# Patient Record
Sex: Male | Born: 1935 | Race: White | Hispanic: No | Marital: Married | Smoking: Never smoker
Health system: Southern US, Community
[De-identification: ages and names within clinical notes are randomized; demographics above are authoritative.]

## PROBLEM LIST (undated history)

## (undated) DIAGNOSIS — I499 Cardiac arrhythmia, unspecified: Secondary | ICD-10-CM

## (undated) DIAGNOSIS — J90 Pleural effusion, not elsewhere classified: Secondary | ICD-10-CM

## (undated) DIAGNOSIS — E039 Hypothyroidism, unspecified: Secondary | ICD-10-CM

## (undated) DIAGNOSIS — I4510 Unspecified right bundle-branch block: Secondary | ICD-10-CM

## (undated) DIAGNOSIS — H3530 Unspecified macular degeneration: Secondary | ICD-10-CM

## (undated) DIAGNOSIS — K219 Gastro-esophageal reflux disease without esophagitis: Secondary | ICD-10-CM

## (undated) DIAGNOSIS — M1990 Unspecified osteoarthritis, unspecified site: Secondary | ICD-10-CM

## (undated) DIAGNOSIS — H9190 Unspecified hearing loss, unspecified ear: Secondary | ICD-10-CM

## (undated) DIAGNOSIS — M109 Gout, unspecified: Secondary | ICD-10-CM

## (undated) DIAGNOSIS — I3139 Other pericardial effusion (noninflammatory): Secondary | ICD-10-CM

## (undated) DIAGNOSIS — E079 Disorder of thyroid, unspecified: Secondary | ICD-10-CM

## (undated) DIAGNOSIS — Z973 Presence of spectacles and contact lenses: Secondary | ICD-10-CM

## (undated) DIAGNOSIS — Z87442 Personal history of urinary calculi: Secondary | ICD-10-CM

## (undated) DIAGNOSIS — K5790 Diverticulosis of intestine, part unspecified, without perforation or abscess without bleeding: Secondary | ICD-10-CM

## (undated) DIAGNOSIS — I313 Pericardial effusion (noninflammatory): Secondary | ICD-10-CM

## (undated) DIAGNOSIS — I10 Essential (primary) hypertension: Secondary | ICD-10-CM

## (undated) HISTORY — DX: Pleural effusion, not elsewhere classified: J90

## (undated) HISTORY — PX: ROTATOR CUFF REPAIR: SHX139

## (undated) HISTORY — DX: Cardiac arrhythmia, unspecified: I49.9

## (undated) HISTORY — DX: Disorder of thyroid, unspecified: E07.9

## (undated) HISTORY — PX: CATARACT EXTRACTION: SUR2

## (undated) HISTORY — PX: ARTHROSCOPY KNEE W/ DRILLING: SUR92

## (undated) HISTORY — PX: HERNIA REPAIR: SHX51

## (undated) HISTORY — PX: CARDIAC ELECTROPHYSIOLOGY STUDY AND ABLATION: SHX1294

## (undated) HISTORY — DX: Gout, unspecified: M10.9

## (undated) HISTORY — PX: TONSILLECTOMY: SUR1361

## (undated) HISTORY — DX: Unspecified osteoarthritis, unspecified site: M19.90

## (undated) HISTORY — PX: CHEST TUBE INSERTION: SHX231

## (undated) HISTORY — DX: Essential (primary) hypertension: I10

## (undated) HISTORY — DX: Gastro-esophageal reflux disease without esophagitis: K21.9

---

## 1999-03-15 HISTORY — PX: FRACTURE SURGERY: SHX138

## 2000-03-14 HISTORY — PX: ROTATOR CUFF REPAIR: SHX139

## 2000-08-30 ENCOUNTER — Ambulatory Visit (HOSPITAL_BASED_OUTPATIENT_CLINIC_OR_DEPARTMENT_OTHER): Admission: RE | Admit: 2000-08-30 | Discharge: 2000-08-30 | Payer: Self-pay | Admitting: Orthopedic Surgery

## 2000-09-11 ENCOUNTER — Encounter: Admission: RE | Admit: 2000-09-11 | Discharge: 2000-11-21 | Payer: Self-pay | Admitting: Orthopedic Surgery

## 2001-01-16 ENCOUNTER — Encounter: Payer: Self-pay | Admitting: Emergency Medicine

## 2001-01-17 ENCOUNTER — Encounter: Payer: Self-pay | Admitting: Internal Medicine

## 2001-01-17 ENCOUNTER — Inpatient Hospital Stay (HOSPITAL_COMMUNITY): Admission: EM | Admit: 2001-01-17 | Discharge: 2001-01-17 | Payer: Self-pay | Admitting: Emergency Medicine

## 2001-11-29 ENCOUNTER — Ambulatory Visit (HOSPITAL_COMMUNITY): Admission: RE | Admit: 2001-11-29 | Discharge: 2001-11-29 | Payer: Self-pay | Admitting: Gastroenterology

## 2001-11-29 ENCOUNTER — Encounter (INDEPENDENT_AMBULATORY_CARE_PROVIDER_SITE_OTHER): Payer: Self-pay

## 2005-03-14 HISTORY — PX: CARDIAC CATHETERIZATION: SHX172

## 2005-09-16 ENCOUNTER — Encounter: Admission: RE | Admit: 2005-09-16 | Discharge: 2005-09-16 | Payer: Self-pay | Admitting: Orthopedic Surgery

## 2005-09-21 ENCOUNTER — Ambulatory Visit (HOSPITAL_BASED_OUTPATIENT_CLINIC_OR_DEPARTMENT_OTHER): Admission: RE | Admit: 2005-09-21 | Discharge: 2005-09-21 | Payer: Self-pay | Admitting: Orthopedic Surgery

## 2006-03-21 ENCOUNTER — Inpatient Hospital Stay (HOSPITAL_COMMUNITY): Admission: EM | Admit: 2006-03-21 | Discharge: 2006-03-22 | Payer: Self-pay | Admitting: Emergency Medicine

## 2009-03-17 ENCOUNTER — Encounter: Payer: Self-pay | Admitting: Internal Medicine

## 2009-03-26 ENCOUNTER — Encounter: Payer: Self-pay | Admitting: Internal Medicine

## 2009-03-27 ENCOUNTER — Encounter: Payer: Self-pay | Admitting: Internal Medicine

## 2009-03-31 ENCOUNTER — Encounter: Payer: Self-pay | Admitting: Internal Medicine

## 2009-04-22 DIAGNOSIS — I10 Essential (primary) hypertension: Secondary | ICD-10-CM | POA: Insufficient documentation

## 2009-04-22 DIAGNOSIS — E039 Hypothyroidism, unspecified: Secondary | ICD-10-CM | POA: Insufficient documentation

## 2009-04-22 DIAGNOSIS — M129 Arthropathy, unspecified: Secondary | ICD-10-CM | POA: Insufficient documentation

## 2009-04-22 DIAGNOSIS — K219 Gastro-esophageal reflux disease without esophagitis: Secondary | ICD-10-CM

## 2009-04-23 ENCOUNTER — Ambulatory Visit: Payer: Self-pay | Admitting: Internal Medicine

## 2009-04-23 DIAGNOSIS — I471 Supraventricular tachycardia: Secondary | ICD-10-CM | POA: Insufficient documentation

## 2009-04-24 ENCOUNTER — Telehealth: Payer: Self-pay | Admitting: Internal Medicine

## 2009-04-28 ENCOUNTER — Encounter: Payer: Self-pay | Admitting: Internal Medicine

## 2009-05-06 ENCOUNTER — Ambulatory Visit: Payer: Self-pay | Admitting: Internal Medicine

## 2009-05-07 LAB — CONVERTED CEMR LAB
BUN: 14 mg/dL (ref 6–23)
Chloride: 105 meq/L (ref 96–112)
Eosinophils Absolute: 0.2 10*3/uL (ref 0.0–0.7)
INR: 1.1 — ABNORMAL HIGH (ref 0.8–1.0)
Lymphocytes Relative: 27.1 % (ref 12.0–46.0)
Lymphs Abs: 1.7 10*3/uL (ref 0.7–4.0)
MCHC: 33.2 g/dL (ref 30.0–36.0)
Monocytes Absolute: 0.5 10*3/uL (ref 0.1–1.0)
Monocytes Relative: 8.1 % (ref 3.0–12.0)
Neutro Abs: 3.9 10*3/uL (ref 1.4–7.7)
Neutrophils Relative %: 61.5 % (ref 43.0–77.0)
Platelets: 137 10*3/uL — ABNORMAL LOW (ref 150.0–400.0)
RBC: 4.5 M/uL (ref 4.22–5.81)
WBC: 6.3 10*3/uL (ref 4.5–10.5)

## 2009-05-13 ENCOUNTER — Ambulatory Visit: Payer: Self-pay | Admitting: Internal Medicine

## 2009-05-13 ENCOUNTER — Ambulatory Visit (HOSPITAL_COMMUNITY): Admission: RE | Admit: 2009-05-13 | Discharge: 2009-05-13 | Payer: Self-pay | Admitting: Internal Medicine

## 2009-05-14 ENCOUNTER — Telehealth: Payer: Self-pay | Admitting: Internal Medicine

## 2009-05-28 ENCOUNTER — Ambulatory Visit: Payer: Self-pay | Admitting: Internal Medicine

## 2009-10-15 ENCOUNTER — Inpatient Hospital Stay (HOSPITAL_COMMUNITY): Admission: AD | Admit: 2009-10-15 | Discharge: 2009-10-21 | Payer: Self-pay | Admitting: Cardiology

## 2009-10-15 ENCOUNTER — Ambulatory Visit: Payer: Self-pay | Admitting: Cardiothoracic Surgery

## 2009-10-16 ENCOUNTER — Encounter: Payer: Self-pay | Admitting: Cardiothoracic Surgery

## 2009-10-28 ENCOUNTER — Ambulatory Visit: Payer: Self-pay | Admitting: Cardiothoracic Surgery

## 2009-11-04 ENCOUNTER — Ambulatory Visit: Payer: Self-pay | Admitting: Cardiothoracic Surgery

## 2009-11-04 ENCOUNTER — Encounter: Admission: RE | Admit: 2009-11-04 | Discharge: 2009-11-04 | Payer: Self-pay | Admitting: Cardiothoracic Surgery

## 2010-04-15 NOTE — Miscellaneous (Signed)
Summary: list allergies  Clinical Lists Changes  Allergies: Added new allergy or adverse reaction of DEMEROL Added new allergy or adverse reaction of CELEBREX Observations: Added new observation of NKA: F (04/28/2009 11:08)

## 2010-04-15 NOTE — Progress Notes (Signed)
Summary: pt needs appt with Dr. Ladona Ridgel  Phone Note Call from Patient Call back at [phone]   Caller: Patient Reason for Call: Talk to Nurse, Talk to Doctor Summary of Call: per pt call he had a procedure yesterday and was suppose to call you to get set up for a 2wk f/u. First avail was not til April.  Initial call taken by: Omer Jack,  May 14, 2009 2:17 PM  Follow-up for Phone Call        05/28/09 at 9am  pt aware Dennis Bast, RN, BSN  May 14, 2009 3:07 PM

## 2010-04-15 NOTE — Assessment & Plan Note (Signed)
Summary: eph/ s/p ablation   Visit Type:  Follow-up Primary Provider:  Georgann Housekeeper MD  CC:  Groin pain.  History of Present Illness: Samuel Little returns today for followup.  He is a pleasant 77 yo man with a h/o HTN and SVT who underwent EPS/RFA two weeks ago.  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  He notes rare palpitations but no sustained heart racing.  He has gone back to working in his yard.  He has minimal discomfort in his groin which is now basically resolved.  Current Medications (verified): 1)  Atenolol 50 Mg Tabs (Atenolol) .... Take One Tablet By Mouth Daily 2)  Synthroid 175 Mcg Tabs (Levothyroxine Sodium) .... Take One Tablet By Mouth Once Daily. 3)  Probenecid 500 Mg Tabs (Probenecid) .... Take One Tablet By Mouth Once Daily. 4)  Lisinopril-Hydrochlorothiazide 20-12.5 Mg Tabs (Lisinopril-Hydrochlorothiazide) .... Take One Tablet By Mouth Once Daily. 5)  Doxazosin Mesylate 4 Mg Tabs (Doxazosin Mesylate) .... Take One Tablet By Mouth Once Daily. 6)  Omeprazole 20 Mg Cpdr (Omeprazole) .... Take One Tablet By Mouth Once Daily. 7)  Lorazepam 0.5 Mg Tabs (Lorazepam) .... As Needed 8)  Colchicine 0.6 Mg Tabs (Colchicine) .... As Needed 9)  Hydrocodone-Acetaminophen 5-500 Mg Tabs (Hydrocodone-Acetaminophen) .... As Needed 10)  Aleve 220 Mg Tabs (Naproxen Sodium) .... As Needed 11)  Tylenol Arthritis Pain 650 Mg Cr-Tabs (Acetaminophen) .... As Needed 12)  Lutein 15-0.7 Mg Caps (Lutein-Zeaxanthin) .... Uad 13)  Bilberry Plus  Caps (Specialty Vitamins Products) .... Once Daily 14)  Coral Calcium Plus  Caps (Multiple Vitamins-Minerals) .... Once Daily 15)  Ginkgo Biloba 120 Mg Caps (Ginkgo Biloba) .... Once Daily 16)  Vitamin D 2000 Unit Tabs (Cholecalciferol) .... Once Daily 17)  Vitamin C 500 Mg Tabs (Ascorbic Acid) .... Once Daily 18)  Vitamin E 400 Unit Caps (Vitamin E) .... Once Daily 19)  B Complex  Tabs (B Complex Vitamins) .... Once Daily 20)  Caltrate  600+d 600-400 Mg-Unit Tabs (Calcium Carbonate-Vitamin D) .... Once Daily 21)  Cranberry 500 Mg Caps (Cranberry) .... Once Daily 22)  Co Q-10 120 Mg Caps (Coenzyme Q10) .... Once Daily 23)  Pa Resveratrol 250 Mg Caps (Resveratrol) .... Once Daily 24)  Turmeric  Powd (Turmeric (Curcuma Longa)) .... Once Daily 25)  Ra Krill Oil  Caps (Krill Oil) .... Once Daily 26)  Fish Oil   Oil (Fish Oil) .... Once Daily 27)  Glucosamine-Chondroitin   Caps (Glucosamine-Chondroit-Vit C-Mn) .... Once Daily 28)  Black Cherry .... Once Daily 29)  I-Caps .... Once Daily  Allergies: 1)  ! Demerol 2)  ! Celebrex  Past History:  Past Medical History: Last updated: 04/22/2009  1. Arthritis.  2. Gastroesophageal reflux disease.  3. Gout.  4. Hypertension.  5. Hypothyroidism.  Past Surgical History: Last updated: 04/22/2009 1. Lithotripsy per Dr. Larey Dresser. 2. Left inguinal hernia repair. 3. Right rotator cuff repair and right leg fibular surgery for plate placement    - Dr. Georgena Spurling.  Review of Systems  The patient denies chest pain, syncope, dyspnea on exertion, and peripheral edema.    Vital Signs:  Patient profile:   77 year old male Height:      73 inches Weight:      247 pounds BMI:     32.71 Pulse rate:   59 / minute BP sitting:   152 / 72  (left arm)  Vitals Entered By: Samuel Little CMA (May 28, 2009 8:47 AM)  Physical Exam  General:  Well developed, well nourished, in no acute distress.  HEENT: normal Neck: supple. No JVD. Carotids 2+ bilaterally no bruits Cor: RRR no rubs, gallops or murmur Lungs: CTA Ab: soft, nontender. nondistended. No HSM. Good bowel sounds Ext: warm. no cyanosis, clubbing or edema Neuro: alert and oriented. Grossly nonfocal. affect pleasant    Impression & Recommendations:  Problem # 1:  PSVT (ICD-427.0) He has done well after ablation. From my perspective he could stop his atenolol but I will defer this to Dr. Ty Hilts. His updated  medication list for this problem includes:    Atenolol 50 Mg Tabs (Atenolol) .Marland Kitchen... Take one tablet by mouth daily    Lisinopril-hydrochlorothiazide 20-12.5 Mg Tabs (Lisinopril-hydrochlorothiazide) .Marland Kitchen... Take one tablet by mouth once daily.  Problem # 2:  HYPERTENSION (ICD-401.9) His blood pressure is up slightly.  He is scheduled to see Dr. Amil Amen in two weeks and he will adjust his meds as needed for more optimal blood pressure control. His updated medication list for this problem includes:    Atenolol 50 Mg Tabs (Atenolol) .Marland Kitchen... Take one tablet by mouth daily    Lisinopril-hydrochlorothiazide 20-12.5 Mg Tabs (Lisinopril-hydrochlorothiazide) .Marland Kitchen... Take one tablet by mouth once daily.    Doxazosin Mesylate 4 Mg Tabs (Doxazosin mesylate) .Marland Kitchen... Take one tablet by mouth once daily.

## 2010-04-15 NOTE — Progress Notes (Signed)
Summary: Calling to set procedure.  Phone Note Call from Patient Call back at Home Phone [phone]   Caller: Patient Summary of Call: Pt calling to set up procedure for 05/13/09 Initial call taken by: Judie Grieve,  April 24, 2009 11:22 AM  Follow-up for Phone Call        pt scheduled for SVT ablation on 05/13/09 at 7:30am  will have labs drawn on 05/05/09 at 3pm Dennis Bast, RN, BSN  April 24, 2009 1:14 PM

## 2010-04-15 NOTE — Letter (Signed)
Summary: ELectrophysiology/Ablation Procedure Instructions  [facility], Main Office  [address]   [place], [place] [address]   Phone: [phone]  Fax: [phone]     Electrophysiology/Ablation Procedure Instructions    You are scheduled for a(n) SVT ablation on 05/13/09 at 8:30am with Dr. Ladona Ridgel  1.  Please come to the Short Stay Center at [facility] at 6:30am on the day of your procedure.  2.  Come prepared to stay overnight.   Please bring your insurance cards and a list of your medications.  3.  Come to the [place] office on 05/06/09 at 3pm for lab work.  The lab at [facility] is open from 8:30 AM to 1:30 PM and 2:30 PM to 5:00 PM.    You do not have to be fasting.  4.  Do not have anything to eat or drink after midnight the night before your procedure.  5. .  All of your remaining medications may be taken with a small amount of water.  6.  Educational material received:     _____ Ablation   * Occasionally, EP studies and ablations can become lengthy.  Please make your family aware of this before your procedure starts.  Average time ranges from 2-8 hours for EP studies/ablations.  Your physician will locate your family after the procedure with the results.  * If you have any questions after you get home, please call the office at [phone].  Anselm Pancoast

## 2010-04-15 NOTE — Assessment & Plan Note (Signed)
Summary: nep/rapid heart rythmn/jml   Visit Type:  Initial Consult   History of Present Illness: Samuel Little is referred today by Dr. Amil Amen for evaluation of SVT.  The patient is a pleasant 77 yo man with a h/o HTN who had an initial episode of SVT about 10 yrs ago.  Since then his spells have increased in frequency and severity, lasting up to 30 minutes in duration.  They start and stop suddenly, are associated with SOB and dizziness without frank syncope.  The patient has taken beta blockers in escalating doses with only minimal improvement.  No other symptoms except for mild chest pressure with  the episodes.  Past History:  Past Medical History: Last updated: 04/22/2009  1. Arthritis.  2. Gastroesophageal reflux disease.  3. Gout.  4. Hypertension.  5. Hypothyroidism.  Past Surgical History: Last updated: 04/22/2009 1. Lithotripsy per Dr. Larey Dresser. 2. Left inguinal hernia repair. 3. Right rotator cuff repair and right leg fibular surgery for plate placement    - Dr. Georgena Spurling.  Family History: Last updated: 04/22/2009  Very strong for coronary artery disease, hypertension, diabetes.  Social History: Last updated: 04/22/2009  No tobacco or alcohol.  He is retired.  He is married.  Wife is present and very supportive.  Review of Systems       All systems reviewed and negative except as noted in the HPI.  Vital Signs:  Patient profile:   77 year old male Height:      73 inches Weight:      242 pounds BMI:     32.04 Pulse rate:   55 / minute BP sitting:   130 / 80  (left arm)  Vitals Entered By: Laurance Flatten CMA (April 23, 2009 10:44 AM)  Physical Exam  General:  Well developed, well nourished, in no acute distress.  HEENT: normal Neck: supple. No JVD. Carotids 2+ bilaterally no bruits Cor: RRR no rubs, gallops or murmur Lungs: CTA Ab: soft, nontender. nondistended. No HSM. Good bowel sounds Ext: warm. no cyanosis, clubbing or edema Neuro: alert  and oriented. Grossly nonfocal. affect pleasant    EKG  Procedure date:  03/17/2009  Findings:      Normal sinus rhythm with rate of: 74. First degree AV-Block noted.  Right bundle branch block.    Impression & Recommendations:  Problem # 1:  PSVT (ICD-427.0)  The patient has had recurrent symptoms despite medical therapy.  I have discussed the risks/benefits/goals and expectations of ablation and he wishes to proceed. His updated medication list for this problem includes:    Atenolol 50 Mg Tabs (Atenolol) .Marland Kitchen... Take one tablet by mouth daily    Lisinopril-hydrochlorothiazide 20-12.5 Mg Tabs (Lisinopril-hydrochlorothiazide) .Marland Kitchen... Take one tablet by mouth once daily.  His updated medication list for this problem includes:    Atenolol 50 Mg Tabs (Atenolol) .Marland Kitchen... Take one tablet by mouth daily    Lisinopril-hydrochlorothiazide 20-12.5 Mg Tabs (Lisinopril-hydrochlorothiazide) .Marland Kitchen... Take one tablet by mouth once daily.  Problem # 2:  HYPERTENSION (ICD-401.9) His blood pressure is controlled though he does have some fatigue on atenolol.  After ablation, I would expect to stop this medicine and he might require additional medical therapy for his HTN. His updated medication list for this problem includes:    Atenolol 50 Mg Tabs (Atenolol) .Marland Kitchen... Take one tablet by mouth daily    Lisinopril-hydrochlorothiazide 20-12.5 Mg Tabs (Lisinopril-hydrochlorothiazide) .Marland Kitchen... Take one tablet by mouth once daily.    Doxazosin Mesylate 4 Mg Tabs (Doxazosin  mesylate) .Marland Kitchen... Take one tablet by mouth once daily.

## 2010-04-15 NOTE — Consult Note (Signed)
Summary: Samuel Little   Imported By: Marylou Mccoy 05/19/2009 15:04:45  _____________________________________________________________________  External Attachment:    Type:   Image     Comment:   External Document

## 2010-04-15 NOTE — Consult Note (Signed)
Summary: Samuel Little   Imported By: Marylou Mccoy 05/19/2009 15:05:57  _____________________________________________________________________  External Attachment:    Type:   Image     Comment:   External Document

## 2010-04-15 NOTE — Procedures (Signed)
Summary: Event  Event   Imported By: Marylou Mccoy 04/28/2009 09:46:42  _____________________________________________________________________  External Attachment:    Type:   Image     Comment:   External Document

## 2010-05-28 LAB — TISSUE CULTURE

## 2010-05-28 LAB — BASIC METABOLIC PANEL
BUN: 11 mg/dL (ref 6–23)
CO2: 23 mEq/L (ref 19–32)
Calcium: 8.1 mg/dL — ABNORMAL LOW (ref 8.4–10.5)
Chloride: 104 mEq/L (ref 96–112)
Creatinine, Ser: 0.9 mg/dL (ref 0.4–1.5)
GFR calc Af Amer: >60 mL/min (ref >60)
GFR calc non Af Amer: >60 mL/min (ref >60)
Glucose, Bld: 90 mg/dL (ref 70–99)
Potassium: 4.2 mEq/L (ref 3.5–5.1)
Sodium: 134 mEq/L — ABNORMAL LOW (ref 135–145)

## 2010-05-28 LAB — BODY FLUID CULTURE
Culture: NO GROWTH
Culture: NO GROWTH
Gram Stain: NONE SEEN
Gram Stain: NONE SEEN

## 2010-05-28 LAB — FUNGUS CULTURE W SMEAR

## 2010-05-28 LAB — CBC
HCT: 30.9 % — ABNORMAL LOW (ref 39.0–52.0)
HCT: 32.3 % — ABNORMAL LOW (ref 39.0–52.0)
HCT: 33.3 % — ABNORMAL LOW (ref 39.0–52.0)
Hemoglobin: 10.3 g/dL — ABNORMAL LOW (ref 13.0–17.0)
Hemoglobin: 11 g/dL — ABNORMAL LOW (ref 13.0–17.0)
Hemoglobin: 11 g/dL — ABNORMAL LOW (ref 13.0–17.0)
MCH: 30.5 pg (ref 26.0–34.0)
MCH: 30.5 pg (ref 26.0–34.0)
MCH: 30.6 pg (ref 26.0–34.0)
MCH: 31 pg (ref 26.0–34.0)
MCHC: 33 g/dL (ref 30.0–36.0)
MCHC: 33.3 g/dL (ref 30.0–36.0)
MCHC: 34.1 g/dL (ref 30.0–36.0)
MCV: 91 fL (ref 78.0–100.0)
MCV: 91.4 fL (ref 78.0–100.0)
MCV: 92.5 fL (ref 78.0–100.0)
Platelets: 222 10*3/uL (ref 150–400)
Platelets: 230 10*3/uL (ref 150–400)
Platelets: 264 10*3/uL (ref 150–400)
RBC: 3.38 MIL/uL — ABNORMAL LOW (ref 4.22–5.81)
RBC: 3.55 MIL/uL — ABNORMAL LOW (ref 4.22–5.81)
RBC: 3.6 MIL/uL — ABNORMAL LOW (ref 4.22–5.81)
RDW: 13.6 % (ref 11.5–15.5)
RDW: 13.8 % (ref 11.5–15.5)
RDW: 13.8 % (ref 11.5–15.5)
RDW: 13.8 % (ref 11.5–15.5)
WBC: 7.7 10*3/uL (ref 4.0–10.5)
WBC: 8.2 10*3/uL (ref 4.0–10.5)
WBC: 8.2 10*3/uL (ref 4.0–10.5)

## 2010-05-28 LAB — TYPE AND SCREEN
ABO/RH(D): O POS
Antibody Screen: NEGATIVE

## 2010-05-28 LAB — COMPREHENSIVE METABOLIC PANEL
ALT: 26 U/L (ref 0–53)
AST: 18 U/L (ref 0–37)
Albumin: 2.4 g/dL — ABNORMAL LOW (ref 3.5–5.2)
Albumin: 2.9 g/dL — ABNORMAL LOW (ref 3.5–5.2)
Alkaline Phosphatase: 170 U/L — ABNORMAL HIGH (ref 39–117)
BUN: 23 mg/dL (ref 6–23)
CO2: 23 mEq/L (ref 19–32)
CO2: 24 mEq/L (ref 19–32)
Calcium: 8.3 mg/dL — ABNORMAL LOW (ref 8.4–10.5)
Calcium: 8.8 mg/dL (ref 8.4–10.5)
Chloride: 97 mEq/L (ref 96–112)
Chloride: 99 mEq/L (ref 96–112)
Creatinine, Ser: 1 mg/dL (ref 0.4–1.5)
Creatinine, Ser: 1.36 mg/dL (ref 0.4–1.5)
GFR calc Af Amer: >60 mL/min (ref >60)
GFR calc non Af Amer: 51 mL/min — ABNORMAL LOW (ref >60)
GFR calc non Af Amer: >60 mL/min (ref >60)
Glucose, Bld: 111 mg/dL — ABNORMAL HIGH (ref 70–99)
Glucose, Bld: 139 mg/dL — ABNORMAL HIGH (ref 70–99)
Potassium: 4 mEq/L (ref 3.5–5.1)
Potassium: 4 mEq/L (ref 3.5–5.1)
Sodium: 131 mEq/L — ABNORMAL LOW (ref 135–145)
Total Bilirubin: 1.5 mg/dL — ABNORMAL HIGH (ref 0.3–1.2)
Total Protein: 5.1 g/dL — ABNORMAL LOW (ref 6.0–8.3)
Total Protein: 6.2 g/dL (ref 6.0–8.3)

## 2010-05-28 LAB — ANAEROBIC CULTURE: Gram Stain: NONE SEEN

## 2010-05-28 LAB — MRSA PCR SCREENING: MRSA by PCR: NEGATIVE

## 2010-05-28 LAB — BLOOD GAS, ARTERIAL
Acid-Base Excess: 0.2 mmol/L (ref 0.0–2.0)
Bicarbonate: 23.4 mEq/L (ref 20.0–24.0)
Drawn by: 252031
O2 Saturation: 92 %
Patient temperature: 98.6
TCO2: 24.4 mmol/L (ref 0–100)
pCO2 arterial: 32 mmHg — ABNORMAL LOW (ref 35.0–45.0)
pH, Arterial: 7.477 — ABNORMAL HIGH (ref 7.350–7.450)
pO2, Arterial: 59.7 mmHg — ABNORMAL LOW (ref 80.0–100.0)

## 2010-05-28 LAB — MYCOPLASMA / UREAPLASMA CULTURE

## 2010-05-28 LAB — POCT I-STAT 3, ART BLOOD GAS (G3+)
pCO2 arterial: 33.8 mmHg — ABNORMAL LOW (ref 35.0–45.0)
pH, Arterial: 7.44 (ref 7.350–7.450)

## 2010-05-28 LAB — BRAIN NATRIURETIC PEPTIDE: Pro B Natriuretic peptide (BNP): 142 pg/mL — ABNORMAL HIGH (ref 0.0–100.0)

## 2010-05-28 LAB — HEMOGLOBIN A1C
Hgb A1c MFr Bld: 5.9 % — ABNORMAL HIGH (ref <5.7)
Mean Plasma Glucose: 123 mg/dL — ABNORMAL HIGH (ref <117)

## 2010-05-28 LAB — AFB CULTURE WITH SMEAR (NOT AT ARMC): Acid Fast Smear: NONE SEEN

## 2010-05-28 LAB — TSH: TSH: 2.537 u[IU]/mL (ref 0.350–4.500)

## 2010-05-28 LAB — PROTIME-INR: INR: 1.25 (ref 0.00–1.49)

## 2010-07-27 NOTE — Assessment & Plan Note (Signed)
OFFICE VISIT   Samuel Little, Samuel Little  DOB:  [birth year]                                        November 04, 2009  CHART #:  [id number]   CURRENT PROBLEMS:  1. Status post urgent subxiphoid pericardial window for drainage of      large pericardial effusion and hemorrhagic pericarditis.  2. Hypertension.  3. Gastroesophageal reflux disease.   PRESENT ILLNESS:  The patient is a very nice 77 year old male who  returns for his first office visit after undergoing urgent subxiphoid  pericardial window for a large pericardial effusion secondary to  hemorrhagic pericarditis.  This developed in the month of July and  became progressive and was carefully followed by Dr. Amil Amen.  At one  point, a physician in [place] gave the patient Pradaxa for concern over  paroxysmal atrial fibrillation.   Findings of surgery included drainage of a 850 mL effusion with  hemorrhagic pericarditis with a shaggy epicardial fibrinous exudate.  Cultures were all negative and cytologies were also negative.  The  patient was treated with IV antibiotics as well as a course of oral  doxycycline and has done well.  He has no more symptoms of shortness of  breath and weakness or dizziness.  He is in sinus rhythm.  He has not  resumed any aspirin and has been on no blood thinners for the past 3  weeks.   PHYSICAL EXAMINATION:  Blood pressure 110/60, pulse 70, respirations 18,  saturation 95%.  He is alert and comfortable.  Breath sounds are clear  and equal.  Cardiac rhythm is regular.  There is no pericardial  friction, rub, murmur, or gallop.  The pericardial window incision is  well healed.  There is no peripheral edema.   DIAGNOSTIC TESTS:  Chest x-ray reveals clear lung fields, a small  cardiac silhouette, and no pleural effusions.   IMPRESSION AND PLAN:  The patient has had a good early recovery from the  pericardial window.  There is no sign of recurrent pericarditis or  effusion.  I  told him after November 12, 2009, he could start taking  aspirin 81 mg a day and he will increase his activity level gradually  over the next 2 months.  He returned to the care of Dr. Amil Amen and Dr.  Donette Larry and return here as needed.   Kerin Perna, M.D.  Electronically Signed   PV/MEDQ  D:  11/04/2009  T:  11/05/2009  Job:  [id number]

## 2010-07-30 NOTE — H&P (Signed)
[place]  Patient:    Samuel Little, Samuel Little Visit Number: [id number] MRN: [id number]          Service Type: MED Location: 2000 2034 [id number] Attending Physician:  Georgann Housekeeper Dictated by:   Pearla Dubonnet, M.D. Admit Date:  01/16/2001   CC:         Tyson Dense, M.D.  Georgena Spurling, M.D.  Maretta Bees. Vonita Moss, M.D.   History and Physical  CHIEF COMPLAINT:  Chest and shoulder pain.  HISTORY OF PRESENT ILLNESS:  Samuel Little is a pleasant 77 year old male with a history of: 1. Hypertension for greater than 15 years. 2. Renal calculi - followed by Dr. Larey Dresser. 3. Gout. 4. DJD. 5. Hypothyroidism. 6. Episodic GERD. 7. BPH.  The patient presents with an episode of having felt very light-headed and as if he is going to pass out after urinating tonight earlier this evening at approximately 8 p.m.  He had been helping move some furniture in his daughters home.  He felt very weak and sweaty following the lightheadedness, consistent with a vasovagal reaction.  However, shortly thereafter patient noticed some left throat and left shoulder pain and also some left forearm pain; this lasted for a minute or so.  Patient does have a very strong family history of coronary artery disease with his father having had an MI and dying of heart failure at age 30, and another brother with a myocardial infarction and another who has had bypass operation.  Patient denies any heartburn tonight that he has had typically in the past, for which he takes occasional Prevacid.  He has not exercised very much over the past year secondary to orthopedic problems, including a right rotator cuff injury with repair this past summer, and a right fibular fracture requiring a plate in November 2001 - both procedures performed by Dr. Georgena Spurling.  He is admitted now to rule out critical coronary artery disease/unstable angina/MI.  MEDICATIONS: 1. Prinzide 20/12.5 one  daily. 2. Synthroid 175 mcg daily. 3. Prevacid 30 mg daily p.r.n. 4. Probenecid 500 mg orally daily. 5. Tylenol two daily. 6. Baby aspirin one at night. 7. Vioxx occasional use. 8. Cardura 4 mg p.o. q.h.s.  PAST SURGICAL HISTORY: 1. Lithotripsy per Dr. Larey Dresser. 2. Left inguinal hernia repair. 3. Right rotator cuff repair and right leg fibular surgery for plate placement    - Dr. Georgena Spurling.  FAMILY HISTORY:  Very strong for coronary artery disease, hypertension, diabetes.  SOCIAL HISTORY:  No tobacco or alcohol.  He is retired.  He is married.  Wife is present and very supportive.  REVIEW OF SYSTEMS:  No shortness of breath, no cough, no PND, no orthopnea. No change in bowel habits.  Positive nocturia x 1-2.  No claudication.  PHYSICAL EXAMINATION:  GENERAL:  Alert, oriented male in no acute distress.  VITAL SIGNS:  Temperature 97.4, blood pressure 105/71, pulse 70 and regular, respiratory rate 18 and easy.  O2 saturation 97% on room air.  HEENT:  Atraumatic, normocephalic.  NECK:  Supple without JVD.  No obvious goiter, no bruits.  CHEST:  Clear to auscultation.  CARDIAC:  Regular rhythm without murmur, rub, or gallop.  ABDOMEN:  Soft, nontender.  Normal bowel sounds.  No obvious organomegaly.  EXTREMITIES:  Without edema.  NEUROLOGIC:  Nonfocal.  SKIN:  The patient does have an erythematous, splotchy rash over his neck and upper back which is chronic; he sees a dermatologist for this.  LABORATORY DATA:  EKG reveals sinus rhythm at 88 beats per minute, nonspecific ST-T wave changes.  Chest x-ray reveals no active disease.  Protime 14 seconds, INR 1.1, PTT 26 seconds.  White count 6900; hemoglobin 15.3; platelet count 143,000.  BUN 18, creatinine 1.6.  CPK 224, CK-MB 2.9, troponin I 0.03.  LFTs are normal.  ASSESSMENT:  A 77 year old male with throat and arm pain transiently this evening after symptoms suggestive of possible angina.  With his very  strong family history, will admit and rule out MI, and if ruled out will schedule a treadmill stress test.  PLAN: 1. Treat with aspirin, Protonix, nitroglycerin patch, Synthroid, Prinzide,    Cardura, and probenecid. 2. Continue usual medications. 3. Monitor. 4. Check serial cardiac enzymes. 5. EKG in a.m. 6. Cardiology consult in a.m. for stress testing if and when he rules out for    myocardial injury. Dictated by:   Pearla Dubonnet, M.D. Attending Physician:  Georgann Housekeeper DD:  01/17/01 TD:  01/17/01 Job: [id number]

## 2010-07-30 NOTE — Op Note (Signed)
NAMEMAXX, PHAM                ACCOUNT NO.:  [id number]   MEDICAL RECORD NO.:  [id number]          PATIENT TYPE:  AMB   LOCATION:  DSC                          FACILITY:  [facility]   PHYSICIAN:  Mila Homer. Sherlean Foot, M.D. DATE OF BIRTH:  1932-12-26   DATE OF PROCEDURE:  DATE OF DISCHARGE:                                 OPERATIVE REPORT   SURGEON:  Mila Homer. Sherlean Foot, M.D.   ASSISTANT:  None.   ANESTHESIA:  General LMA.   PREOPERATIVE DIAGNOSIS:  Right knee medial meniscal tear and osteoarthritis.   POSTOPERATIVE DIAGNOSIS:  Right knee medial meniscal tear and  osteoarthritis.   PROCEDURE:  Right knee arthroscopy with partial medial, partial lateral  meniscectomies, chondroplasty in 2 compartments.   INDICATIONS FOR PROCEDURE:  The patient is a 77 year old with the above  symptoms.  Informed consent was obtained.   DESCRIPTION OF THE PROCEDURE:  The patient was laid supine under general LMA  anesthesia.  The right leg was prepped in the usual, sterile fashion.  Inferolateral and inferomedial portals were created with an #11 blade, blunt  trocar and cannula.  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  Chondroplasty was performed here.  Also performed a chondroplasty on the grade 2 and 3 chondromalacia in the  depth of the trochlear.  Then went into the medial compartment.  There was  some grade 4 chondromalacia on both femur and tibia, especially on the far  medial portion.  There was a complex medial meniscus tear.  Straight basket  forceps and a great white shaver was used to perform a partial medial  meniscectomy.  ACL and PCL were normal.  There were osteophytes in the  notch.  I went into the figure-four position.  There was only grade 1 to 2  chondromalacia, which was debrided, and a complex lateral meniscus tear.  This was debrided with the straight basket forceps and a great white shaver.  I then took one further tour  through all 3 compartments to ensure that all  debris had been removed and cartilage had been trimmed back to stable  tissue.  I then removed the fluid and instruments and closed with 4-0 nylon  sutures, dressed with a Xeroform dressing, sponge, sterile web roll and Ace  wrap.   COMPLICATIONS:  None.   DRAINS:  None.          ______________________________  Mila Homer. Sherlean Foot, M.D.    SDL/MEDQ  D:  09/21/2005  T:  09/21/2005  Job:  [id number]

## 2010-07-30 NOTE — Op Note (Signed)
   NAME:  BOOKERT, GUZZI                          ACCOUNT NO.:  [id number]   MEDICAL RECORD NO.:  [id number]                   PATIENT TYPE:  AMB   LOCATION:  ENDO                                 FACILITY:  [facility]   PHYSICIAN:  Danise Edge, MD                  DATE OF BIRTH:  March 12, 1933   DATE OF PROCEDURE:  11/29/2001  DATE OF DISCHARGE:                                 OPERATIVE REPORT   PROCEDURE:  Colonoscopy and polypectomy.   INDICATIONS:  The patient is a 77 year old male born 2032/07/26.  The patient  is scheduled to undergo his first screening colonoscopy with polypectomy to  prevent colon cancer.  I discussed with the patient the complications  associated with colonoscopy and polypectomy, including a 15 per thousand  risk of bleeding and one per thousand risk of colon perforation requiring  surgical repair.  The patient has signed the operative permit.   ENDOSCOPIST:  Danise Edge, M.D.   PREMEDICATION:  Versed 5 mg, fentanyl 50 mcg.   ENDOSCOPE:  Olympus pediatric colonoscope.   DESCRIPTION OF PROCEDURE:  After obtaining informed consent, the patient was  placed in the left lateral decubitus position.  I administered intravenous  Versed and intravenous fentanyl to achieve conscious sedation for the  procedure.  The patient's blood pressure, oxygen saturation, and cardiac  rhythm were monitored throughout the procedure and documented in the medical  record.   Anal inspection was normal.  Digital rectal exam was normal.  The Olympus  pediatric video colonoscope was introduced into the rectum and easily  advanced to the cecum.  Colonic preparation for the exam today was  excellent.   The patient has universal colonic diverticulosis without signs for the  presence of diverticulitis or diverticular stricture formation.   Rectum normal.   Sigmoid colon and descending colon normal.   Splenic flexure normal.   Transverse colon normal.   Hepatic flexure normal.   Ascending colon:  From the midascending colon, a 0.5 mm sessile polyp was  removed with the cold biopsy forceps.   Cecum and ileocecal valve normal.   ASSESSMENT:  1. Universal colonic diverticulosis.  2. A 0.5 mm sessile polyp was removed from the midascending colon.   RECOMMENDATIONS:  If ascending colon polyp returns neoplastic  pathologically, the patient should undergo a repeat colonoscopy in five  years.                                               Danise Edge, MD    MJ/MEDQ  D:  11/29/2001  T:  11/30/2001  Job:  [id number]   cc:   Georgann Housekeeper, MD  [address]  [place]  [place] [address]  Fax: [phone]

## 2010-07-30 NOTE — Op Note (Signed)
[place]  Patient:    Samuel Little, Samuel Little                       MRN: [id number] Proc. Date: 08/30/00 Adm. Date:  [id number] Attending:  Georgena Spurling                           Operative Report  PREOPERATIVE DIAGNOSIS:  Right shoulder impingement with rotator cuff tear and acromioclavicular joint osteoarthritis.  POSTOPERATIVE DIAGNOSIS:  Right shoulder impingement with rotator cuff tear and acromioclavicular joint osteoarthritis.  OPERATION PERFORMED:  Right shoulder arthroscopy with mini open rotator cuff repairs, arthroscopic subacromial decompression and distal clavicle resection.  SURGEON:  Georgena Spurling, M.D.  ASSISTANT:  Jamelle Rushing, P.A.  ANESTHESIA:  General plus preoperative interscalene block.  INDICATIONS FOR PROCEDURE:  The patient is a 77 year old white male with clinical and MRI evidence of a large partial thickness rotator cuff tear.  DESCRIPTION OF PROCEDURE:  The patient was laid supine and administered an interscalene block in the preanesthesia holding area, taken to the operating room, administered general anesthesia, placed in a beach chair position.  The right shoulder was prepped and draped in the usual sterile fashion.  A posterior portal was created with a #11 blade, blunt trocar and cannula.  A glenohumeral arthroscopy was performed.  The glenohumeral joint was then entered from the anterior portal and debrided.  We then went into the subacromial space through the posterior portal and made a straight lateral portal going to the subacromial space as well.  We did a subtotal subacromial bursectomy.  We then used the debridement wand to debride the [facility] joint and the 4-0 bur to perform a acromioplasty.  We then went to the [facility] joint, performed a distal clavicle resection, approximately 1 cm of the inferior surface medially.  We then completed our debridement and debrided the frayed rotator cuff.  It became very obvious  that there was an almost complete tear only with a very small flimsy layer left attached.  It was approximately 2 to 3 cm x 2cm depth and width.  Therefore, I evacuated the subacromial space with instrumentation and extended the straight lateral portal out to the anterolateral corner of the acromion, developed flaps, split the deltoid in line with its raphe and placed a self-retaining retractor in place.  I then debrided the bone at the edge of the torn rotator cuff, placed a single biocorkscrew suture anchor and then used the Viper device to place two vertical mattress sutures in the rotator cuff tying it down to fresh denuded bone.  We then rasped the anterolateral corner a bit more, irrigated, closed the deltoid interval with interrupted  0 Vicryl sutures, interrupted 2-0 Vicryl sutures in the subcuticular layer and a running 3-0 Monocryl with Steri-Strips.  We closed the portals with 4-0 nylon interrupted sutures, placing 0.5% Marcaine morphine mixture into the wounds and closed with Adaptic 4 x 4s, ABDs, tape and a sling.  The patient tolerated the procedure well. Estimated blood loss for this procedure was 100 cc.  COMPLICATIONS:  None.  DRAINS:  None. DD:  08/30/00 TD:  08/30/00 Job: [id number]

## 2010-07-30 NOTE — H&P (Signed)
NAMECHANCELLOR, VANDERLOOP NO.:  [id number]   MEDICAL RECORD NO.:  [id number]          PATIENT TYPE:  INP   LOCATION:  1833                         FACILITY:  [facility]   PHYSICIAN:  Lucita Ferrara, MD         DATE OF BIRTH:  23-Mar-1932   DATE OF ADMISSION:  03/21/2006  DATE OF DISCHARGE:                              HISTORY & PHYSICAL   HISTORY OF PRESENT ILLNESS:  The patient is a 77 year old male with past  medical history significant for arthritis, gastroesophageal reflux  disease, hypertension, hypothyroidism, presents to the emergency room  with a complaint of chest pain and shortness of breath which started  around 1 p.m. today.  The patient at that time was riding a stationary  bicycle where he started getting dizziness and lightness in the head.  He felt pounding in his chest.  He denied any chest pain, however, and  he states that at that time he took his heart rate and it was 198.  He  took his blood pressure and it was 100/77.  The patient took a log of  events that happened thereafter which consisted of his blood pressure  raising from 100 all the way up to 137 systolic, and diastolic all the  way to 82, and his pulse rate dropping all the way down to 115.  Relevant to the history of present illness, the patient sees Dr. Donette Larry  as his primary care doctor who sent him for a stress test secondary to  some shortness of breath that he was having.  Presumably the stress test  might have been positive at that time and the patient went for a  catheterization.  This was last February.  Presumably the  catheterization was negative at that time.  The patient denies any  history after the catheterization and prior to this event.   REVIEW OF SYSTEMS:  Otherwise review of systems is negative.  He denied  any focal neurological deficits.  He denies any pressure-like chest  pain.  He denies any gastrointestinal symptoms, nausea, vomiting.  Otherwise review of systems is  negative.   PAST MEDICAL HISTORY:  1. Arthritis.  2. Gastroesophageal reflux disease.  3. Gout.  4. Hypertension.  5. Hypothyroidism.   MEDICATIONS:  The patient is taking:  1. Aspirin 81 mg, one tab p.o. daily.  2. Synthroid 175 mcg, one tab p.o. daily.  3. Probenecid 500 mg, one tab p.o. daily.  4. Lisinopril/hydrochlorothiazide 20/12.5, one tab p.o. daily.  5. Doxazosin 4 mg, one tab p.o. daily.  6. Omeprazole 20 mg, one tab p.o. daily.  7. Colchicine 0.6 mg as needed.  8. Vicodin as needed.  9. Aleve as needed.   ALLERGIES:  THE PATIENT IS APPARENTLY ALLERGIC TO DEMEROL.   PHYSICAL EXAMINATION:  GENERAL:  The patient is in no acute distress.  VITAL SIGNS:  Blood pressure when I saw him was 149/91, pulse 98,  respirations 16, pulse ox 99% on room air.  HEENT:  Normocephalic atraumatic.  Sclerae anicteric.  PERRLA.  Extraocular muscles intact.  NECK:  No JVD.  No carotid bruits.  Neck is supple.  No thyromegaly.  CARDIOVASCULAR:  S1 S2.  Regular rate and rhythm.  No murmurs, rubs, or  clicks.  ABDOMEN:  Soft, nontender, nondistended.  Positive bowel sounds.  LUNGS:  Clear to auscultation bilaterally.  No rhonchi, rales, or  wheezes.  EXTREMITIES:  No clubbing, cyanosis, or edema.   LABORATORY DATA:  Shows the following:  Sodium 138, potassium __________  , chloride 109, BUN 16, glucose 127.  First set of cardiac enzymes  negative.  Second set of cardiac enzymes negative.   Cardiology was contacted here in the emergency room who did not think  there was any acute event for him to be seen tonight.  The patient was  given nitroglycerin 0.4 mg sublingually and there was a saline lock with  some fluid bolus given.  The patient's blood pressure improved and  symptomatically he was much better here in the emergency room.   ASSESSMENT/PLAN:  This is a 77 year old male.  It sounds like he had an  either vasovagal or hypotensive episode perhaps secondary to doxazosin  which can  cause this or other causes of orthostasis or vasovagal.  We  will go ahead and admit him to telemetry floor.  We will do cardiac  enzymes every 8 hours x3.  We will monitor for any arrhythmias.  Would  be wise to get cardiology consulted to follow the patient just in case  there is any hemodynamic instability.  The patient is hemodynamically  stable right now and we will continue all other medications as  prescribed.      Lucita Ferrara, MD  Electronically Signed     RR/MEDQ  D:  03/21/2006  T:  03/21/2006  Job:  [id number]   cc:   Georgann Housekeeper, MD

## 2011-03-17 DIAGNOSIS — IMO0002 Reserved for concepts with insufficient information to code with codable children: Secondary | ICD-10-CM | POA: Diagnosis not present

## 2011-04-07 DIAGNOSIS — H353190 Nonexudative age-related macular degeneration, unspecified eye, stage unspecified: Secondary | ICD-10-CM | POA: Diagnosis not present

## 2011-08-03 DIAGNOSIS — I4891 Unspecified atrial fibrillation: Secondary | ICD-10-CM | POA: Diagnosis not present

## 2011-08-03 DIAGNOSIS — I471 Supraventricular tachycardia: Secondary | ICD-10-CM | POA: Diagnosis not present

## 2011-08-03 DIAGNOSIS — I319 Disease of pericardium, unspecified: Secondary | ICD-10-CM | POA: Diagnosis not present

## 2011-08-03 DIAGNOSIS — L570 Actinic keratosis: Secondary | ICD-10-CM | POA: Diagnosis not present

## 2011-08-03 DIAGNOSIS — D235 Other benign neoplasm of skin of trunk: Secondary | ICD-10-CM | POA: Diagnosis not present

## 2011-08-05 ENCOUNTER — Other Ambulatory Visit: Payer: Self-pay | Admitting: Otolaryngology

## 2011-08-05 DIAGNOSIS — D2320 Other benign neoplasm of skin of unspecified ear and external auricular canal: Secondary | ICD-10-CM | POA: Diagnosis not present

## 2011-08-29 DIAGNOSIS — E039 Hypothyroidism, unspecified: Secondary | ICD-10-CM | POA: Diagnosis not present

## 2011-08-29 DIAGNOSIS — R7309 Other abnormal glucose: Secondary | ICD-10-CM | POA: Diagnosis not present

## 2011-08-29 DIAGNOSIS — K219 Gastro-esophageal reflux disease without esophagitis: Secondary | ICD-10-CM | POA: Diagnosis not present

## 2011-08-29 DIAGNOSIS — I10 Essential (primary) hypertension: Secondary | ICD-10-CM | POA: Diagnosis not present

## 2011-08-29 DIAGNOSIS — I471 Supraventricular tachycardia: Secondary | ICD-10-CM | POA: Diagnosis not present

## 2011-08-29 DIAGNOSIS — M109 Gout, unspecified: Secondary | ICD-10-CM | POA: Diagnosis not present

## 2011-11-29 DIAGNOSIS — E039 Hypothyroidism, unspecified: Secondary | ICD-10-CM | POA: Diagnosis not present

## 2011-11-29 DIAGNOSIS — I471 Supraventricular tachycardia: Secondary | ICD-10-CM | POA: Diagnosis not present

## 2011-11-29 DIAGNOSIS — R7309 Other abnormal glucose: Secondary | ICD-10-CM | POA: Diagnosis not present

## 2011-11-29 DIAGNOSIS — M109 Gout, unspecified: Secondary | ICD-10-CM | POA: Diagnosis not present

## 2011-11-29 DIAGNOSIS — K219 Gastro-esophageal reflux disease without esophagitis: Secondary | ICD-10-CM | POA: Diagnosis not present

## 2011-11-29 DIAGNOSIS — I10 Essential (primary) hypertension: Secondary | ICD-10-CM | POA: Diagnosis not present

## 2011-11-29 DIAGNOSIS — N400 Enlarged prostate without lower urinary tract symptoms: Secondary | ICD-10-CM | POA: Diagnosis not present

## 2011-12-21 DIAGNOSIS — Z23 Encounter for immunization: Secondary | ICD-10-CM | POA: Diagnosis not present

## 2012-01-05 DIAGNOSIS — I8310 Varicose veins of unspecified lower extremity with inflammation: Secondary | ICD-10-CM | POA: Diagnosis not present

## 2012-01-12 DIAGNOSIS — M1710 Unilateral primary osteoarthritis, unspecified knee: Secondary | ICD-10-CM | POA: Diagnosis not present

## 2012-02-06 DIAGNOSIS — I4891 Unspecified atrial fibrillation: Secondary | ICD-10-CM | POA: Diagnosis not present

## 2012-02-06 DIAGNOSIS — E782 Mixed hyperlipidemia: Secondary | ICD-10-CM | POA: Diagnosis not present

## 2012-02-06 DIAGNOSIS — I471 Supraventricular tachycardia: Secondary | ICD-10-CM | POA: Diagnosis not present

## 2012-02-06 DIAGNOSIS — I10 Essential (primary) hypertension: Secondary | ICD-10-CM | POA: Diagnosis not present

## 2012-02-13 DIAGNOSIS — I8310 Varicose veins of unspecified lower extremity with inflammation: Secondary | ICD-10-CM | POA: Diagnosis not present

## 2012-02-20 DIAGNOSIS — K219 Gastro-esophageal reflux disease without esophagitis: Secondary | ICD-10-CM | POA: Diagnosis not present

## 2012-02-20 DIAGNOSIS — I10 Essential (primary) hypertension: Secondary | ICD-10-CM | POA: Diagnosis not present

## 2012-02-20 DIAGNOSIS — E782 Mixed hyperlipidemia: Secondary | ICD-10-CM | POA: Diagnosis not present

## 2012-02-20 DIAGNOSIS — Z1331 Encounter for screening for depression: Secondary | ICD-10-CM | POA: Diagnosis not present

## 2012-02-20 DIAGNOSIS — M109 Gout, unspecified: Secondary | ICD-10-CM | POA: Diagnosis not present

## 2012-02-20 DIAGNOSIS — M1990 Unspecified osteoarthritis, unspecified site: Secondary | ICD-10-CM | POA: Diagnosis not present

## 2012-02-20 DIAGNOSIS — Z0000 Encounter for general adult medical examination without abnormal findings: Secondary | ICD-10-CM | POA: Diagnosis not present

## 2012-02-20 DIAGNOSIS — N400 Enlarged prostate without lower urinary tract symptoms: Secondary | ICD-10-CM | POA: Diagnosis not present

## 2012-02-20 DIAGNOSIS — E039 Hypothyroidism, unspecified: Secondary | ICD-10-CM | POA: Diagnosis not present

## 2012-02-20 DIAGNOSIS — E119 Type 2 diabetes mellitus without complications: Secondary | ICD-10-CM | POA: Diagnosis not present

## 2012-02-22 DIAGNOSIS — M79609 Pain in unspecified limb: Secondary | ICD-10-CM | POA: Diagnosis not present

## 2012-02-22 DIAGNOSIS — I872 Venous insufficiency (chronic) (peripheral): Secondary | ICD-10-CM | POA: Diagnosis not present

## 2012-02-22 DIAGNOSIS — I8310 Varicose veins of unspecified lower extremity with inflammation: Secondary | ICD-10-CM | POA: Diagnosis not present

## 2012-02-24 DIAGNOSIS — I872 Venous insufficiency (chronic) (peripheral): Secondary | ICD-10-CM | POA: Diagnosis not present

## 2012-02-24 DIAGNOSIS — I8310 Varicose veins of unspecified lower extremity with inflammation: Secondary | ICD-10-CM | POA: Diagnosis not present

## 2012-02-24 DIAGNOSIS — M79609 Pain in unspecified limb: Secondary | ICD-10-CM | POA: Diagnosis not present

## 2012-03-21 DIAGNOSIS — I8310 Varicose veins of unspecified lower extremity with inflammation: Secondary | ICD-10-CM | POA: Diagnosis not present

## 2012-03-23 DIAGNOSIS — I8310 Varicose veins of unspecified lower extremity with inflammation: Secondary | ICD-10-CM | POA: Diagnosis not present

## 2012-03-23 DIAGNOSIS — M79609 Pain in unspecified limb: Secondary | ICD-10-CM | POA: Diagnosis not present

## 2012-04-12 DIAGNOSIS — H26499 Other secondary cataract, unspecified eye: Secondary | ICD-10-CM | POA: Diagnosis not present

## 2012-04-25 DIAGNOSIS — M79609 Pain in unspecified limb: Secondary | ICD-10-CM | POA: Diagnosis not present

## 2012-04-25 DIAGNOSIS — I8310 Varicose veins of unspecified lower extremity with inflammation: Secondary | ICD-10-CM | POA: Diagnosis not present

## 2012-05-14 DIAGNOSIS — I8310 Varicose veins of unspecified lower extremity with inflammation: Secondary | ICD-10-CM | POA: Diagnosis not present

## 2012-05-14 DIAGNOSIS — M79609 Pain in unspecified limb: Secondary | ICD-10-CM | POA: Diagnosis not present

## 2012-05-14 DIAGNOSIS — M7981 Nontraumatic hematoma of soft tissue: Secondary | ICD-10-CM | POA: Diagnosis not present

## 2012-05-17 DIAGNOSIS — H26499 Other secondary cataract, unspecified eye: Secondary | ICD-10-CM | POA: Diagnosis not present

## 2012-05-30 DIAGNOSIS — I8310 Varicose veins of unspecified lower extremity with inflammation: Secondary | ICD-10-CM | POA: Diagnosis not present

## 2012-06-01 DIAGNOSIS — M79609 Pain in unspecified limb: Secondary | ICD-10-CM | POA: Diagnosis not present

## 2012-06-01 DIAGNOSIS — M7981 Nontraumatic hematoma of soft tissue: Secondary | ICD-10-CM | POA: Diagnosis not present

## 2012-06-01 DIAGNOSIS — I8310 Varicose veins of unspecified lower extremity with inflammation: Secondary | ICD-10-CM | POA: Diagnosis not present

## 2012-06-11 DIAGNOSIS — H26499 Other secondary cataract, unspecified eye: Secondary | ICD-10-CM | POA: Diagnosis not present

## 2012-06-19 DIAGNOSIS — M79609 Pain in unspecified limb: Secondary | ICD-10-CM | POA: Diagnosis not present

## 2012-06-19 DIAGNOSIS — I8310 Varicose veins of unspecified lower extremity with inflammation: Secondary | ICD-10-CM | POA: Diagnosis not present

## 2012-07-03 DIAGNOSIS — M79609 Pain in unspecified limb: Secondary | ICD-10-CM | POA: Diagnosis not present

## 2012-07-03 DIAGNOSIS — I8310 Varicose veins of unspecified lower extremity with inflammation: Secondary | ICD-10-CM | POA: Diagnosis not present

## 2012-07-03 DIAGNOSIS — M7981 Nontraumatic hematoma of soft tissue: Secondary | ICD-10-CM | POA: Diagnosis not present

## 2012-07-17 DIAGNOSIS — M79609 Pain in unspecified limb: Secondary | ICD-10-CM | POA: Diagnosis not present

## 2012-07-17 DIAGNOSIS — I8310 Varicose veins of unspecified lower extremity with inflammation: Secondary | ICD-10-CM | POA: Diagnosis not present

## 2012-07-17 DIAGNOSIS — M7981 Nontraumatic hematoma of soft tissue: Secondary | ICD-10-CM | POA: Diagnosis not present

## 2012-07-31 DIAGNOSIS — L570 Actinic keratosis: Secondary | ICD-10-CM | POA: Diagnosis not present

## 2012-07-31 DIAGNOSIS — L821 Other seborrheic keratosis: Secondary | ICD-10-CM | POA: Diagnosis not present

## 2012-07-31 DIAGNOSIS — D235 Other benign neoplasm of skin of trunk: Secondary | ICD-10-CM | POA: Diagnosis not present

## 2012-07-31 DIAGNOSIS — L259 Unspecified contact dermatitis, unspecified cause: Secondary | ICD-10-CM | POA: Diagnosis not present

## 2012-08-22 DIAGNOSIS — M7981 Nontraumatic hematoma of soft tissue: Secondary | ICD-10-CM | POA: Diagnosis not present

## 2012-09-07 DIAGNOSIS — R7989 Other specified abnormal findings of blood chemistry: Secondary | ICD-10-CM | POA: Diagnosis not present

## 2012-09-07 DIAGNOSIS — M109 Gout, unspecified: Secondary | ICD-10-CM | POA: Diagnosis not present

## 2012-09-07 DIAGNOSIS — K219 Gastro-esophageal reflux disease without esophagitis: Secondary | ICD-10-CM | POA: Diagnosis not present

## 2012-09-07 DIAGNOSIS — E039 Hypothyroidism, unspecified: Secondary | ICD-10-CM | POA: Diagnosis not present

## 2012-09-07 DIAGNOSIS — I10 Essential (primary) hypertension: Secondary | ICD-10-CM | POA: Diagnosis not present

## 2012-09-12 DIAGNOSIS — I4891 Unspecified atrial fibrillation: Secondary | ICD-10-CM | POA: Diagnosis not present

## 2012-09-12 DIAGNOSIS — I471 Supraventricular tachycardia: Secondary | ICD-10-CM | POA: Diagnosis not present

## 2012-09-12 DIAGNOSIS — E782 Mixed hyperlipidemia: Secondary | ICD-10-CM | POA: Diagnosis not present

## 2012-09-12 DIAGNOSIS — I10 Essential (primary) hypertension: Secondary | ICD-10-CM | POA: Diagnosis not present

## 2012-09-15 DIAGNOSIS — K5732 Diverticulitis of large intestine without perforation or abscess without bleeding: Secondary | ICD-10-CM | POA: Diagnosis not present

## 2012-09-18 DIAGNOSIS — K5732 Diverticulitis of large intestine without perforation or abscess without bleeding: Secondary | ICD-10-CM | POA: Diagnosis not present

## 2012-09-19 DIAGNOSIS — H1130 Conjunctival hemorrhage, unspecified eye: Secondary | ICD-10-CM | POA: Diagnosis not present

## 2012-10-22 DIAGNOSIS — H908 Mixed conductive and sensorineural hearing loss, unspecified: Secondary | ICD-10-CM | POA: Diagnosis not present

## 2012-11-06 DIAGNOSIS — H905 Unspecified sensorineural hearing loss: Secondary | ICD-10-CM | POA: Diagnosis not present

## 2012-11-06 DIAGNOSIS — H908 Mixed conductive and sensorineural hearing loss, unspecified: Secondary | ICD-10-CM | POA: Diagnosis not present

## 2012-11-06 DIAGNOSIS — H6980 Other specified disorders of Eustachian tube, unspecified ear: Secondary | ICD-10-CM | POA: Diagnosis not present

## 2012-12-31 DIAGNOSIS — Z23 Encounter for immunization: Secondary | ICD-10-CM | POA: Diagnosis not present

## 2013-01-17 DIAGNOSIS — M1710 Unilateral primary osteoarthritis, unspecified knee: Secondary | ICD-10-CM | POA: Diagnosis not present

## 2013-01-28 DIAGNOSIS — I8310 Varicose veins of unspecified lower extremity with inflammation: Secondary | ICD-10-CM | POA: Diagnosis not present

## 2013-02-12 DIAGNOSIS — I8310 Varicose veins of unspecified lower extremity with inflammation: Secondary | ICD-10-CM | POA: Diagnosis not present

## 2013-02-22 DIAGNOSIS — K219 Gastro-esophageal reflux disease without esophagitis: Secondary | ICD-10-CM | POA: Diagnosis not present

## 2013-02-22 DIAGNOSIS — R7309 Other abnormal glucose: Secondary | ICD-10-CM | POA: Diagnosis not present

## 2013-02-22 DIAGNOSIS — E039 Hypothyroidism, unspecified: Secondary | ICD-10-CM | POA: Diagnosis not present

## 2013-02-22 DIAGNOSIS — N400 Enlarged prostate without lower urinary tract symptoms: Secondary | ICD-10-CM | POA: Diagnosis not present

## 2013-02-22 DIAGNOSIS — Z23 Encounter for immunization: Secondary | ICD-10-CM | POA: Diagnosis not present

## 2013-02-22 DIAGNOSIS — M109 Gout, unspecified: Secondary | ICD-10-CM | POA: Diagnosis not present

## 2013-02-22 DIAGNOSIS — I471 Supraventricular tachycardia: Secondary | ICD-10-CM | POA: Diagnosis not present

## 2013-02-22 DIAGNOSIS — Z0000 Encounter for general adult medical examination without abnormal findings: Secondary | ICD-10-CM | POA: Diagnosis not present

## 2013-02-22 DIAGNOSIS — Z1331 Encounter for screening for depression: Secondary | ICD-10-CM | POA: Diagnosis not present

## 2013-02-22 DIAGNOSIS — I10 Essential (primary) hypertension: Secondary | ICD-10-CM | POA: Diagnosis not present

## 2013-03-19 ENCOUNTER — Ambulatory Visit (INDEPENDENT_AMBULATORY_CARE_PROVIDER_SITE_OTHER): Payer: Medicare Other | Admitting: Cardiology

## 2013-03-19 ENCOUNTER — Encounter: Payer: Self-pay | Admitting: Cardiology

## 2013-03-19 VITALS — BP 110/70 | HR 68 | Ht 72.0 in | Wt 207.0 lb

## 2013-03-19 DIAGNOSIS — I10 Essential (primary) hypertension: Secondary | ICD-10-CM | POA: Diagnosis not present

## 2013-03-19 DIAGNOSIS — I471 Supraventricular tachycardia: Secondary | ICD-10-CM | POA: Diagnosis not present

## 2013-03-19 NOTE — Progress Notes (Signed)
[place]. [address], [address], [place]  [address] Phone: [phone] Fax:  [phone]  Date:  03/19/2013   ID:  Samuel Little, DOB 04-27-1932, MRN [id number]  PCP:  Wenda Low, MD   History of Present Illness: Samuel Little is a 80 y.o. 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 Had a brief episode of atrial fibrillation that was induced by SVT previously. He has had SVT ablation by Dr. Lovena Le. 3/11. He has also had a pericardial window due to pericardial effusion in August/2011. Felt skipping one day that was consistent. Feels some SOB, palps after skipping PPI. PPI seems to help. An event monitor showed no evidence of atrial fibrillation. He is not on any anticoagulation. He was on Pradaxa for 7 days prescribed by a doctor in [place] after a brief episode but this was stopped by Dr. Leonia Reeves after no further evidence of atrial fibrillation was discovered. He has worn an event monitor in the past as above.   Overall he has been doing well. He is on atenolol 25 mg once a day down from 50. +Fatigue. No chest pain. I decided to try to discontinue his atenolol given his heart rate in the 50s, fatigue, blood pressure. He is now taking very low dose 12.5mg . He is to contact me if symptoms worsen. Occasionally feels heavy breathing with sensation in the throat. When he forgets GERD medication sensation is worse. Sometimes the sensations are relieved with coughing. He will have a day here and there were he feels his heart skipping..    Wt Readings from Last 3 Encounters:  03/19/13 207 lb (93.895 kg)  05/28/09 247 lb (112.038 kg)  04/23/09 242 lb (109.77 kg)     Past Medical History  Diagnosis Date  . Hypertension   . Thyroid disease     hypothyroidism  . Gout   . Pleural effusion, left   .  Gastroesophageal reflux disease   . Arthritis   . 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  Current Outpatient Prescriptions  Medication Sig Dispense Refill  . Acetaminophen (TYLENOL ARTHRITIS PAIN PO) Take by mouth as needed.      . Ascorbic Acid (VITAMIN C) 500 MG CAPS Take by mouth daily.      Marland Kitchen atenolol (TENORMIN) 25 MG tablet Take 12.5 mg by mouth daily.      . Bilberry, Vaccinium myrtillus, (BILBERRY PO) Take by mouth.      . Calcium-Vitamin D (CALTRATE 600 PLUS-VIT D PO) Take by mouth daily.      . Cholecalciferol (VITAMIN D) 2000 UNITS tablet Take 2,000 Units by mouth daily.      . Cobalamine Combinations (VITAMIN B12-FOLIC ACID PO) Take by mouth daily.      . Coenzyme Q10 (CO Q 10 PO) Take by mouth daily.      . colchicine 0.6 MG tablet Take 0.6 mg by mouth as needed.      Marland Kitchen CORAL CALCIUM PO Take by mouth. TAKE 2 TABLETS DAILY      . CRANBERRY PO Take by mouth daily.      Marland Kitchen doxazosin (CARDURA) 4 MG tablet Take 4 mg by mouth daily.      . Flaxseed, Linseed, (FLAX  SEED OIL PO) Take by mouth daily.      . Glucos-Chondroit-Hyaluron-MSM (GLUCOSAMINE CHONDROITIN JOINT PO) Take by mouth daily.      Marland Kitchen KRILL OIL ULTRA STRENGTH PO Take by mouth daily.      Marland Kitchen levothyroxine (SYNTHROID, LEVOTHROID) 175 MCG tablet Take 175 mcg by mouth daily before breakfast.      . lisinopril-hydrochlorothiazide (PRINZIDE,ZESTORETIC) 20-12.5 MG per tablet Take 1 tablet by mouth daily.      . Misc Natural Products (BLACK CHERRY CONCENTRATE PO) Take by mouth daily.      . MULTIPLE VITAMIN PO Take by mouth daily.      . multivitamin-lutein (OCUVITE-LUTEIN) CAPS capsule Take 1 capsule by mouth daily.      Marland Kitchen omeprazole (PRILOSEC) 20 MG capsule Take 20 mg by mouth daily.      Marland Kitchen OVER THE COUNTER MEDICATION TAKE ONE PILL OF [place] DAILY      . probenecid (BENEMID) 500 MG tablet Take 500  mg by mouth daily.      Marland Kitchen RESVERATROL PO Take by mouth daily.      . traMADol (ULTRAM) 50 MG tablet Take by mouth every 6 (six) hours as needed.      . TURMERIC PO Take by mouth daily.       No current facility-administered medications for this visit.    Allergies:    Allergies  Allergen Reactions  . Celecoxib   . Meperidine Hcl     REACTION: Nausea    Social History:  The patient  reports that he has never smoked. He does not have any smokeless tobacco history on file. He reports that he does not drink alcohol or use illicit drugs.   ROS:  Please see the history of present illness.   Knee pain. No chest pain, no syncope, no palpitations  PHYSICAL EXAM: VS:  BP 110/70  Pulse 68  Ht 6' (1.829 m)  Wt 207 lb (93.895 kg)  BMI 28.07 kg/m2 Well nourished, well developed, in no acute distress HEENT: normal Neck: no JVD Cardiac:  Mildly bradycardic at times normal S1, S2; RRR; no murmur Lungs:  clear to auscultation bilaterally, no wheezing, rhonchi or rales Abd: soft, nontender, no hepatomegaly Ext: no edema Skin: warm and dry Neuro: no focal abnormalities noted  EKG:  Sinus rhythm rate 68 with right bundle branch block and no other abnormalities     ASSESSMENT AND PLAN:  1. Chronic right bundle branch block-stable, no symptoms. I'm going ahead and stopping his low-dose atenolol. If he starts to notice his blood pressure increasing, he is to resume, lat me know. 2. SVT-status post ablation. Holding very well. See above for possible atrial fibrillation. No further issues. 3. Hypertension-blood pressure very well controlled. If it increases after stopping atenolol, please let me know. 4. Prior pericardial effusion-resolved.  Signed, Candee Furbish, MD [facility]  03/19/2013 11:27 AM

## 2013-03-19 NOTE — Patient Instructions (Signed)
Your physician has recommended you make the following change in your medication:   1. Stop Atenolol  Your physician wants you to follow-up in: 1 year with Dr. Marlou Porch. You will receive a reminder letter in the mail two months in advance. If you don't receive a letter, please call our office to schedule the follow-up appointment.

## 2013-07-10 DIAGNOSIS — B369 Superficial mycosis, unspecified: Secondary | ICD-10-CM | POA: Diagnosis not present

## 2013-07-10 DIAGNOSIS — H905 Unspecified sensorineural hearing loss: Secondary | ICD-10-CM | POA: Diagnosis not present

## 2013-07-10 DIAGNOSIS — H9190 Unspecified hearing loss, unspecified ear: Secondary | ICD-10-CM | POA: Diagnosis not present

## 2013-07-10 DIAGNOSIS — H6120 Impacted cerumen, unspecified ear: Secondary | ICD-10-CM | POA: Diagnosis not present

## 2013-07-10 DIAGNOSIS — B49 Unspecified mycosis: Secondary | ICD-10-CM | POA: Diagnosis not present

## 2013-07-10 DIAGNOSIS — H6980 Other specified disorders of Eustachian tube, unspecified ear: Secondary | ICD-10-CM | POA: Diagnosis not present

## 2013-07-25 DIAGNOSIS — H353190 Nonexudative age-related macular degeneration, unspecified eye, stage unspecified: Secondary | ICD-10-CM | POA: Diagnosis not present

## 2013-07-25 DIAGNOSIS — H04129 Dry eye syndrome of unspecified lacrimal gland: Secondary | ICD-10-CM | POA: Diagnosis not present

## 2013-08-02 DIAGNOSIS — B49 Unspecified mycosis: Secondary | ICD-10-CM | POA: Diagnosis not present

## 2013-08-02 DIAGNOSIS — H905 Unspecified sensorineural hearing loss: Secondary | ICD-10-CM | POA: Diagnosis not present

## 2013-08-02 DIAGNOSIS — B369 Superficial mycosis, unspecified: Secondary | ICD-10-CM | POA: Diagnosis not present

## 2013-08-02 DIAGNOSIS — H6120 Impacted cerumen, unspecified ear: Secondary | ICD-10-CM | POA: Diagnosis not present

## 2013-09-10 DIAGNOSIS — I10 Essential (primary) hypertension: Secondary | ICD-10-CM | POA: Diagnosis not present

## 2013-09-10 DIAGNOSIS — E039 Hypothyroidism, unspecified: Secondary | ICD-10-CM | POA: Diagnosis not present

## 2013-09-10 DIAGNOSIS — M1990 Unspecified osteoarthritis, unspecified site: Secondary | ICD-10-CM | POA: Diagnosis not present

## 2013-09-10 DIAGNOSIS — K219 Gastro-esophageal reflux disease without esophagitis: Secondary | ICD-10-CM | POA: Diagnosis not present

## 2013-09-10 DIAGNOSIS — M109 Gout, unspecified: Secondary | ICD-10-CM | POA: Diagnosis not present

## 2013-09-10 DIAGNOSIS — R7309 Other abnormal glucose: Secondary | ICD-10-CM | POA: Diagnosis not present

## 2013-12-17 ENCOUNTER — Encounter (HOSPITAL_COMMUNITY): Payer: Self-pay | Admitting: Emergency Medicine

## 2013-12-17 ENCOUNTER — Inpatient Hospital Stay (HOSPITAL_COMMUNITY)
Admission: EM | Admit: 2013-12-17 | Discharge: 2013-12-20 | DRG: 872 | Disposition: A | Discharge status: Home / Self Care | Payer: Medicare Other | Attending: Internal Medicine | Admitting: Internal Medicine

## 2013-12-17 DIAGNOSIS — R5081 Fever presenting with conditions classified elsewhere: Secondary | ICD-10-CM | POA: Diagnosis not present

## 2013-12-17 DIAGNOSIS — R7881 Bacteremia: Secondary | ICD-10-CM | POA: Diagnosis present

## 2013-12-17 DIAGNOSIS — N183 Chronic kidney disease, stage 3 unspecified: Secondary | ICD-10-CM

## 2013-12-17 DIAGNOSIS — I959 Hypotension, unspecified: Secondary | ICD-10-CM | POA: Diagnosis present

## 2013-12-17 DIAGNOSIS — R030 Elevated blood-pressure reading, without diagnosis of hypertension: Secondary | ICD-10-CM | POA: Diagnosis not present

## 2013-12-17 DIAGNOSIS — A4151 Sepsis due to Escherichia coli [E. coli]: Principal | ICD-10-CM

## 2013-12-17 DIAGNOSIS — Z79899 Other long term (current) drug therapy: Secondary | ICD-10-CM

## 2013-12-17 DIAGNOSIS — Z8249 Family history of ischemic heart disease and other diseases of the circulatory system: Secondary | ICD-10-CM | POA: Diagnosis not present

## 2013-12-17 DIAGNOSIS — N200 Calculus of kidney: Secondary | ICD-10-CM | POA: Diagnosis present

## 2013-12-17 DIAGNOSIS — I10 Essential (primary) hypertension: Secondary | ICD-10-CM

## 2013-12-17 DIAGNOSIS — R0789 Other chest pain: Secondary | ICD-10-CM | POA: Diagnosis not present

## 2013-12-17 DIAGNOSIS — R351 Nocturia: Secondary | ICD-10-CM | POA: Diagnosis present

## 2013-12-17 DIAGNOSIS — K5732 Diverticulitis of large intestine without perforation or abscess without bleeding: Secondary | ICD-10-CM | POA: Diagnosis present

## 2013-12-17 DIAGNOSIS — A419 Sepsis, unspecified organism: Secondary | ICD-10-CM | POA: Diagnosis not present

## 2013-12-17 DIAGNOSIS — R509 Fever, unspecified: Secondary | ICD-10-CM

## 2013-12-17 DIAGNOSIS — I129 Hypertensive chronic kidney disease with stage 1 through stage 4 chronic kidney disease, or unspecified chronic kidney disease: Secondary | ICD-10-CM | POA: Diagnosis present

## 2013-12-17 DIAGNOSIS — K219 Gastro-esophageal reflux disease without esophagitis: Secondary | ICD-10-CM | POA: Diagnosis present

## 2013-12-17 DIAGNOSIS — K4090 Unilateral inguinal hernia, without obstruction or gangrene, not specified as recurrent: Secondary | ICD-10-CM | POA: Diagnosis not present

## 2013-12-17 DIAGNOSIS — Z23 Encounter for immunization: Secondary | ICD-10-CM | POA: Diagnosis not present

## 2013-12-17 DIAGNOSIS — Z9889 Other specified postprocedural states: Secondary | ICD-10-CM | POA: Diagnosis not present

## 2013-12-17 DIAGNOSIS — B9620 Unspecified Escherichia coli [E. coli] as the cause of diseases classified elsewhere: Secondary | ICD-10-CM

## 2013-12-17 DIAGNOSIS — M1990 Unspecified osteoarthritis, unspecified site: Secondary | ICD-10-CM | POA: Diagnosis present

## 2013-12-17 DIAGNOSIS — K5900 Constipation, unspecified: Secondary | ICD-10-CM | POA: Diagnosis present

## 2013-12-17 DIAGNOSIS — D696 Thrombocytopenia, unspecified: Secondary | ICD-10-CM | POA: Diagnosis present

## 2013-12-17 DIAGNOSIS — K5790 Diverticulosis of intestine, part unspecified, without perforation or abscess without bleeding: Secondary | ICD-10-CM | POA: Diagnosis not present

## 2013-12-17 DIAGNOSIS — I471 Supraventricular tachycardia: Secondary | ICD-10-CM | POA: Diagnosis present

## 2013-12-17 DIAGNOSIS — E039 Hypothyroidism, unspecified: Secondary | ICD-10-CM | POA: Diagnosis present

## 2013-12-17 DIAGNOSIS — Z1611 Resistance to penicillins: Secondary | ICD-10-CM | POA: Diagnosis present

## 2013-12-17 DIAGNOSIS — Z885 Allergy status to narcotic agent status: Secondary | ICD-10-CM | POA: Diagnosis not present

## 2013-12-17 DIAGNOSIS — K2100 Gastro-esophageal reflux disease with esophagitis, without bleeding: Secondary | ICD-10-CM

## 2013-12-17 DIAGNOSIS — M109 Gout, unspecified: Secondary | ICD-10-CM | POA: Diagnosis present

## 2013-12-17 DIAGNOSIS — R109 Unspecified abdominal pain: Secondary | ICD-10-CM | POA: Diagnosis not present

## 2013-12-17 DIAGNOSIS — N401 Enlarged prostate with lower urinary tract symptoms: Secondary | ICD-10-CM | POA: Diagnosis present

## 2013-12-17 DIAGNOSIS — I4510 Unspecified right bundle-branch block: Secondary | ICD-10-CM | POA: Diagnosis present

## 2013-12-17 DIAGNOSIS — K5792 Diverticulitis of intestine, part unspecified, without perforation or abscess without bleeding: Secondary | ICD-10-CM

## 2013-12-17 DIAGNOSIS — Z888 Allergy status to other drugs, medicaments and biological substances status: Secondary | ICD-10-CM

## 2013-12-17 DIAGNOSIS — N400 Enlarged prostate without lower urinary tract symptoms: Secondary | ICD-10-CM | POA: Diagnosis not present

## 2013-12-17 DIAGNOSIS — R1110 Vomiting, unspecified: Secondary | ICD-10-CM | POA: Diagnosis not present

## 2013-12-17 DIAGNOSIS — N3941 Urge incontinence: Secondary | ICD-10-CM | POA: Diagnosis present

## 2013-12-17 DIAGNOSIS — R112 Nausea with vomiting, unspecified: Secondary | ICD-10-CM

## 2013-12-17 DIAGNOSIS — Z7982 Long term (current) use of aspirin: Secondary | ICD-10-CM | POA: Diagnosis not present

## 2013-12-17 DIAGNOSIS — J90 Pleural effusion, not elsewhere classified: Secondary | ICD-10-CM | POA: Diagnosis present

## 2013-12-17 LAB — CBC WITH DIFFERENTIAL/PLATELET
Basophils Absolute: 0 10*3/uL (ref 0.0–0.1)
Basophils Relative: 0 % (ref 0–1)
Eosinophils Absolute: 0.1 10*3/uL (ref 0.0–0.7)
Eosinophils Relative: 1 % (ref 0–5)
HEMATOCRIT: 40.8 % (ref 39.0–52.0)
Hemoglobin: 14.2 g/dL (ref 13.0–17.0)
LYMPHS PCT: 8 % — AB (ref 12–46)
Lymphs Abs: 0.3 10*3/uL — ABNORMAL LOW (ref 0.7–4.0)
MCH: 32.8 pg (ref 26.0–34.0)
MCHC: 34.8 g/dL (ref 30.0–36.0)
MCV: 94.2 fL (ref 78.0–100.0)
MONO ABS: 0 10*3/uL — AB (ref 0.1–1.0)
Monocytes Relative: 1 % — ABNORMAL LOW (ref 3–12)
Neutro Abs: 3.7 10*3/uL (ref 1.7–7.7)
Neutrophils Relative %: 90 % — ABNORMAL HIGH (ref 43–77)
Platelets: 135 10*3/uL — ABNORMAL LOW (ref 150–400)
RBC: 4.33 MIL/uL (ref 4.22–5.81)
RDW: 13.8 % (ref 11.5–15.5)
WBC: 4.1 10*3/uL (ref 4.0–10.5)

## 2013-12-17 LAB — I-STAT CG4 LACTIC ACID, ED: Lactic Acid, Venous: 2.03 mmol/L (ref 0.5–2.2)

## 2013-12-17 LAB — I-STAT TROPONIN, ED: Troponin i, poc: 0.01 ng/mL (ref 0.00–0.08)

## 2013-12-17 MED ORDER — ONDANSETRON HCL 4 MG/2ML IJ SOLN
4.0000 mg | Freq: Once | INTRAMUSCULAR | Status: AC
  Administered 2013-12-17: 4 mg via INTRAVENOUS

## 2013-12-17 MED ORDER — ACETAMINOPHEN 325 MG PO TABS
650.0000 mg | ORAL_TABLET | Freq: Four times a day (QID) | ORAL | Status: DC | PRN
  Administered 2013-12-17: 650 mg via ORAL

## 2013-12-17 MED ORDER — IOHEXOL 300 MG/ML  SOLN: 25.0000 mL | Freq: Once | INTRAMUSCULAR | Status: AC | PRN

## 2013-12-17 MED ORDER — ONDANSETRON 4 MG PO TBDP: 4.0000 mg | ORAL_TABLET | Freq: Once | ORAL | Status: DC

## 2013-12-17 MED ORDER — SODIUM CHLORIDE 0.9 % IV BOLUS (SEPSIS)
1000.0000 mL | Freq: Once | INTRAVENOUS | Status: AC
  Administered 2013-12-17: 1000 mL via INTRAVENOUS

## 2013-12-17 NOTE — ED Provider Notes (Signed)
TIME SEEN: 11:25 PM  CHIEF COMPLAINT: Fever, nausea, vomiting, chest pain  HPI: Patient is a 80 year old male with history of hypertension, hypothyroidism, AVNRT status post ablation who presents to the emergency department with complaints of fever that started tonight, chest pain that he describes as a "rawness". He has had nausea and vomiting here in the emergency department. No diarrhea. No headache, neck pain or neck stiffness. No rash. Denies any abdominal pain or back pain. No dysuria or hematuria. No sick contacts or recent travel.  ROS: See HPI Constitutional:  fever  Eyes: no drainage  ENT: no runny nose   Cardiovascular:  chest pain  Resp: no SOB  GI: vomiting GU: no dysuria Integumentary: no rash  Allergy: no hives  Musculoskeletal: no leg swelling  Neurological: no slurred speech ROS otherwise negative  PAST MEDICAL HISTORY/PAST SURGICAL HISTORY:  Past Medical History  Diagnosis Date  . Hypertension   . Thyroid disease     hypothyroidism  . Gout   . Pleural effusion, left   . Gastroesophageal reflux disease   . Arthritis   . 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 Authorizing Provider  Acetaminophen (TYLENOL ARTHRITIS PAIN PO) Take by mouth as needed.    Historical Provider, MD  Ascorbic Acid (VITAMIN C) 500 MG CAPS Take by mouth daily.    Historical Provider, MD  Bilberry, Vaccinium myrtillus, (BILBERRY PO) Take by mouth.    Historical Provider, MD  Calcium-Vitamin D (CALTRATE 600 PLUS-VIT D PO) Take by mouth daily.    Historical Provider, MD  Cholecalciferol (VITAMIN D) 2000 UNITS tablet Take 2,000 Units by mouth daily.    Historical Provider, MD  Cobalamine Combinations (VITAMIN B12-FOLIC ACID PO) Take by mouth daily.    Historical Provider, MD  Coenzyme Q10 (CO Q 10 PO) Take by mouth  daily.    Historical Provider, MD  colchicine 0.6 MG tablet Take 0.6 mg by mouth as needed.    Historical Provider, MD  CORAL CALCIUM PO Take by mouth. TAKE 2 TABLETS DAILY    Historical Provider, MD  CRANBERRY PO Take by mouth daily.    Historical Provider, MD  doxazosin (CARDURA) 4 MG tablet Take 4 mg by mouth daily.    Historical Provider, MD  Flaxseed, Linseed, (FLAX SEED OIL PO) Take by mouth daily.    Historical Provider, MD  Glucos-Chondroit-Hyaluron-MSM (GLUCOSAMINE CHONDROITIN JOINT PO) Take by mouth daily.    Historical Provider, MD  KRILL OIL ULTRA STRENGTH PO Take by mouth daily.    Historical Provider, MD  levothyroxine (SYNTHROID, LEVOTHROID) 175 MCG tablet Take 175 mcg by mouth daily before breakfast.    Historical Provider, MD  lisinopril-hydrochlorothiazide (PRINZIDE,ZESTORETIC) 20-12.5 MG per tablet Take 1 tablet by mouth daily.    Historical Provider, MD  Misc Natural Products (BLACK CHERRY CONCENTRATE PO) Take by mouth daily.    Historical Provider, MD  MULTIPLE VITAMIN PO Take by mouth daily.    Historical Provider, MD  multivitamin-lutein (OCUVITE-LUTEIN) CAPS capsule Take 1 capsule by mouth daily.    Historical Provider, MD  omeprazole (PRILOSEC) 20 MG capsule Take 20 mg by mouth daily.    Historical Provider, MD  OVER THE COUNTER MEDICATION TAKE ONE PILL OF [place]    Historical Provider, MD  probenecid (BENEMID) 500 MG tablet Take 500 mg by  mouth daily.    Historical Provider, MD  RESVERATROL PO Take by mouth daily.    Historical Provider, MD  traMADol (ULTRAM) 50 MG tablet Take by mouth every 6 (six) hours as needed.    Historical Provider, MD  TURMERIC PO Take by mouth daily.    Historical Provider, MD    ALLERGIES:  Allergies  Allergen Reactions  . Celecoxib   . Meperidine Hcl     REACTION: Nausea    SOCIAL HISTORY:  History  Substance Use Topics  . Smoking status: Never Smoker   . Smokeless tobacco: Not on file  . Alcohol Use: No    FAMILY  HISTORY: No family history on file.  EXAM: BP 148/55  Pulse 90  Temp(Src) 101.1 F (38.4 C) (Oral)  Resp 20  SpO2 97% CONSTITUTIONAL: Alert and oriented and responds appropriately to questions. Well-appearing; well-nourished HEAD: Normocephalic EYES: Conjunctivae clear, PERRL ENT: normal nose; no rhinorrhea; moist mucous membranes; pharynx without lesions noted NECK: Supple, no meningismus, no LAD  CARD: RRR; S1 and S2 appreciated; no murmurs, no clicks, no rubs, no gallops RESP: Normal chest excursion without splinting or tachypnea; breath sounds clear and equal bilaterally; no wheezes, no rhonchi, no rales, no hypoxia or respiratory distress, speaking full sentences ABD/GI: Normal bowel sounds; non-distended; soft, tender to palpation over his right upper quadrant and right lower quadrant with some intermittent voluntary guarding, no rebound or peritoneal signs BACK:  The back appears normal and is non-tender to palpation, there is no CVA tenderness EXT: Normal ROM in all joints; non-tender to palpation; no edema; normal capillary refill; no cyanosis    SKIN: Normal color for age and race; warm NEURO: Moves all extremities equally PSYCH: The patient's mood and manner are appropriate. Grooming and personal hygiene are appropriate.  MEDICAL DECISION MAKING: Patient here with fevers, chest pain, vomiting and right-sided abdominal pain with palpation of his abdomen. He is febrile to 101.1 but otherwise hemodynamically stable. We'll obtain labs including troponin, urine and urine culture, blood cultures, lactate, chest x-ray. We'll also obtain a CT of his abdomen and pelvis as he does appear tender to palpation in the right side of his abdomen with palpation. We'll give IV fluids, Tylenol, Zofran and reassess.  ED PROGRESS: Patient's labs are unremarkable. Lactate normal. Troponin negative. Urine shows no sign of infection. Chest x-ray clear. CT scan shows mild focal acute diverticulitis of  the cecum without abscess or perforation. There are small bilateral inguinal hernias only containing fat. Patient is still vomiting and does not feel well. I feel given his persistent vomiting, fever and age he likely needs admission for IV hydration and antibiotics and symptom control. Will discuss with hospitalist. PCP is Dr. Lysle Rubens with [facility] physicians. Family comfortable with this plan.    Spoke with Dr. Donna Bernard for admission to tele, inpt.     Date: 12/18/2013 23:31  Rate: 110  Rhythm: Sinus tachycardia  QRS Axis: Left axis deviation  Intervals: Right bundle branch block  ST/T Wave abnormalities: normal  Conduction Disutrbances: none  Narrative Interpretation: Left axis deviation, right bundle branch block, sinus tachycardia       EKG Interpretation  Date/Time:  Wednesday December 18 2013 00:08:59 EDT Ventricular Rate:  98 PR Interval:  50 QRS Duration: 142 QT Interval:  409 QTC Calculation: 522 R Axis:   -57 Text Interpretation:  Sinus rhythm Short PR interval RBBB and LAFB Confirmed by Chelsee Hosie,  DO, Raenette Sakata ([id number]) on 12/18/2013 12:11:50 AM  [place], DO 12/18/13 [phone]

## 2013-12-17 NOTE — ED Notes (Signed)
Per EMS pt reports he was sitting at home watching the ball game when he all of a sudden started shivering and couldn't stop. Pt reports fatigue and nausea. Pt also states he "feels a knot in his chest and rawness".

## 2013-12-18 ENCOUNTER — Encounter (HOSPITAL_COMMUNITY): Payer: Self-pay

## 2013-12-18 ENCOUNTER — Emergency Department (HOSPITAL_COMMUNITY): Payer: Medicare Other

## 2013-12-18 DIAGNOSIS — A4151 Sepsis due to Escherichia coli [E. coli]: Secondary | ICD-10-CM | POA: Diagnosis not present

## 2013-12-18 DIAGNOSIS — A419 Sepsis, unspecified organism: Secondary | ICD-10-CM

## 2013-12-18 DIAGNOSIS — K5792 Diverticulitis of intestine, part unspecified, without perforation or abscess without bleeding: Secondary | ICD-10-CM | POA: Diagnosis not present

## 2013-12-18 DIAGNOSIS — R351 Nocturia: Secondary | ICD-10-CM | POA: Diagnosis present

## 2013-12-18 DIAGNOSIS — N183 Chronic kidney disease, stage 3 unspecified: Secondary | ICD-10-CM | POA: Diagnosis present

## 2013-12-18 DIAGNOSIS — I959 Hypotension, unspecified: Secondary | ICD-10-CM | POA: Diagnosis present

## 2013-12-18 DIAGNOSIS — N3941 Urge incontinence: Secondary | ICD-10-CM | POA: Diagnosis present

## 2013-12-18 DIAGNOSIS — I10 Essential (primary) hypertension: Secondary | ICD-10-CM | POA: Diagnosis not present

## 2013-12-18 DIAGNOSIS — K210 Gastro-esophageal reflux disease with esophagitis: Secondary | ICD-10-CM | POA: Diagnosis not present

## 2013-12-18 DIAGNOSIS — Z885 Allergy status to narcotic agent status: Secondary | ICD-10-CM | POA: Diagnosis not present

## 2013-12-18 DIAGNOSIS — Z888 Allergy status to other drugs, medicaments and biological substances status: Secondary | ICD-10-CM | POA: Diagnosis not present

## 2013-12-18 DIAGNOSIS — Z1611 Resistance to penicillins: Secondary | ICD-10-CM | POA: Diagnosis present

## 2013-12-18 DIAGNOSIS — N200 Calculus of kidney: Secondary | ICD-10-CM | POA: Diagnosis not present

## 2013-12-18 DIAGNOSIS — M109 Gout, unspecified: Secondary | ICD-10-CM | POA: Diagnosis present

## 2013-12-18 DIAGNOSIS — K4090 Unilateral inguinal hernia, without obstruction or gangrene, not specified as recurrent: Secondary | ICD-10-CM | POA: Diagnosis not present

## 2013-12-18 DIAGNOSIS — D696 Thrombocytopenia, unspecified: Secondary | ICD-10-CM | POA: Diagnosis present

## 2013-12-18 DIAGNOSIS — I4510 Unspecified right bundle-branch block: Secondary | ICD-10-CM | POA: Diagnosis present

## 2013-12-18 DIAGNOSIS — K219 Gastro-esophageal reflux disease without esophagitis: Secondary | ICD-10-CM | POA: Diagnosis present

## 2013-12-18 DIAGNOSIS — R7881 Bacteremia: Secondary | ICD-10-CM | POA: Diagnosis not present

## 2013-12-18 DIAGNOSIS — K5732 Diverticulitis of large intestine without perforation or abscess without bleeding: Secondary | ICD-10-CM | POA: Diagnosis present

## 2013-12-18 DIAGNOSIS — J90 Pleural effusion, not elsewhere classified: Secondary | ICD-10-CM | POA: Diagnosis present

## 2013-12-18 DIAGNOSIS — N400 Enlarged prostate without lower urinary tract symptoms: Secondary | ICD-10-CM | POA: Diagnosis not present

## 2013-12-18 DIAGNOSIS — R509 Fever, unspecified: Secondary | ICD-10-CM | POA: Diagnosis not present

## 2013-12-18 DIAGNOSIS — R109 Unspecified abdominal pain: Secondary | ICD-10-CM | POA: Diagnosis not present

## 2013-12-18 DIAGNOSIS — I471 Supraventricular tachycardia: Secondary | ICD-10-CM | POA: Diagnosis present

## 2013-12-18 DIAGNOSIS — Z79899 Other long term (current) drug therapy: Secondary | ICD-10-CM | POA: Diagnosis not present

## 2013-12-18 DIAGNOSIS — Z23 Encounter for immunization: Secondary | ICD-10-CM | POA: Diagnosis not present

## 2013-12-18 DIAGNOSIS — Z8249 Family history of ischemic heart disease and other diseases of the circulatory system: Secondary | ICD-10-CM | POA: Diagnosis not present

## 2013-12-18 DIAGNOSIS — K5900 Constipation, unspecified: Secondary | ICD-10-CM | POA: Diagnosis present

## 2013-12-18 DIAGNOSIS — R1110 Vomiting, unspecified: Secondary | ICD-10-CM | POA: Diagnosis not present

## 2013-12-18 DIAGNOSIS — Z7982 Long term (current) use of aspirin: Secondary | ICD-10-CM | POA: Diagnosis not present

## 2013-12-18 DIAGNOSIS — Z9889 Other specified postprocedural states: Secondary | ICD-10-CM | POA: Diagnosis not present

## 2013-12-18 DIAGNOSIS — E039 Hypothyroidism, unspecified: Secondary | ICD-10-CM | POA: Diagnosis present

## 2013-12-18 DIAGNOSIS — N401 Enlarged prostate with lower urinary tract symptoms: Secondary | ICD-10-CM | POA: Diagnosis present

## 2013-12-18 DIAGNOSIS — M1990 Unspecified osteoarthritis, unspecified site: Secondary | ICD-10-CM | POA: Diagnosis present

## 2013-12-18 DIAGNOSIS — I129 Hypertensive chronic kidney disease with stage 1 through stage 4 chronic kidney disease, or unspecified chronic kidney disease: Secondary | ICD-10-CM | POA: Diagnosis present

## 2013-12-18 LAB — BASIC METABOLIC PANEL
Anion gap: 13 (ref 5–15)
BUN: 20 mg/dL (ref 6–23)
CHLORIDE: 104 meq/L (ref 96–112)
CO2: 22 meq/L (ref 19–32)
CREATININE: 1.32 mg/dL (ref 0.50–1.35)
Calcium: 8.6 mg/dL (ref 8.4–10.5)
GFR calc Af Amer: 58 mL/min — ABNORMAL LOW (ref >90)
GFR calc non Af Amer: 50 mL/min — ABNORMAL LOW (ref >90)
GLUCOSE: 119 mg/dL — AB (ref 70–99)
Potassium: 3.5 mEq/L — ABNORMAL LOW (ref 3.7–5.3)
Sodium: 139 mEq/L (ref 137–147)

## 2013-12-18 LAB — CBC
HEMATOCRIT: 36.5 % — AB (ref 39.0–52.0)
HEMOGLOBIN: 12.8 g/dL — AB (ref 13.0–17.0)
MCH: 32.9 pg (ref 26.0–34.0)
MCHC: 35.1 g/dL (ref 30.0–36.0)
MCV: 93.8 fL (ref 78.0–100.0)
PLATELETS: 105 10*3/uL — AB (ref 150–400)
RBC: 3.89 MIL/uL — ABNORMAL LOW (ref 4.22–5.81)
RDW: 14.1 % (ref 11.5–15.5)
WBC: 14.2 10*3/uL — ABNORMAL HIGH (ref 4.0–10.5)

## 2013-12-18 LAB — TSH: TSH: 0.299 u[IU]/mL — AB (ref 0.350–4.500)

## 2013-12-18 LAB — COMPREHENSIVE METABOLIC PANEL
ALT: 27 U/L (ref 0–53)
ANION GAP: 16 — AB (ref 5–15)
AST: 37 U/L (ref 0–37)
Albumin: 3.5 g/dL (ref 3.5–5.2)
Alkaline Phosphatase: 78 U/L (ref 39–117)
BUN: 20 mg/dL (ref 6–23)
CO2: 24 meq/L (ref 19–32)
CREATININE: 1.35 mg/dL (ref 0.50–1.35)
Calcium: 9.4 mg/dL (ref 8.4–10.5)
Chloride: 102 mEq/L (ref 96–112)
GFR, EST AFRICAN AMERICAN: 56 mL/min — AB (ref >90)
GFR, EST NON AFRICAN AMERICAN: 48 mL/min — AB (ref >90)
Glucose, Bld: 101 mg/dL — ABNORMAL HIGH (ref 70–99)
Potassium: 3.8 mEq/L (ref 3.7–5.3)
Sodium: 142 mEq/L (ref 137–147)
Total Bilirubin: 1.3 mg/dL — ABNORMAL HIGH (ref 0.3–1.2)
Total Protein: 6.7 g/dL (ref 6.0–8.3)

## 2013-12-18 LAB — URINALYSIS, ROUTINE W REFLEX MICROSCOPIC
Bilirubin Urine: NEGATIVE
Glucose, UA: NEGATIVE mg/dL
HGB URINE DIPSTICK: NEGATIVE
Ketones, ur: NEGATIVE mg/dL
Leukocytes, UA: NEGATIVE
Nitrite: NEGATIVE
PROTEIN: NEGATIVE mg/dL
Specific Gravity, Urine: 1.01 (ref 1.005–1.030)
UROBILINOGEN UA: 0.2 mg/dL (ref 0.0–1.0)
pH: 5.5 (ref 5.0–8.0)

## 2013-12-18 LAB — PROTIME-INR
INR: 1.37 (ref 0.00–1.49)
PROTHROMBIN TIME: 16.9 s — AB (ref 11.6–15.2)

## 2013-12-18 LAB — LIPASE, BLOOD: Lipase: 25 U/L (ref 11–59)

## 2013-12-18 MED ORDER — IOHEXOL 300 MG/ML  SOLN
25.0000 mL | Freq: Once | INTRAMUSCULAR | Status: AC | PRN
  Administered 2013-12-18: 25 mL via ORAL

## 2013-12-18 MED ORDER — INFLUENZA VAC SPLIT QUAD 0.5 ML IM SUSY
0.5000 mL | PREFILLED_SYRINGE | INTRAMUSCULAR | Status: AC
  Administered 2013-12-19: 0.5 mL via INTRAMUSCULAR
  Filled 2013-12-18: qty 0.5

## 2013-12-18 MED ORDER — ASPIRIN EC 81 MG PO TBEC
81.0000 mg | DELAYED_RELEASE_TABLET | Freq: Every day | ORAL | Status: DC
  Administered 2013-12-18 – 2013-12-20 (×3): 81 mg via ORAL
  Filled 2013-12-18 (×3): qty 1

## 2013-12-18 MED ORDER — CIPROFLOXACIN IN D5W 400 MG/200ML IV SOLN
400.0000 mg | Freq: Two times a day (BID) | INTRAVENOUS | Status: DC
  Administered 2013-12-18 – 2013-12-20 (×4): 400 mg via INTRAVENOUS
  Filled 2013-12-18 (×6): qty 200

## 2013-12-18 MED ORDER — SODIUM CHLORIDE 0.9 % IV BOLUS (SEPSIS)
1000.0000 mL | Freq: Once | INTRAVENOUS | Status: AC
  Administered 2013-12-18: 1000 mL via INTRAVENOUS

## 2013-12-18 MED ORDER — TRAMADOL HCL 50 MG PO TABS: 50.0000 mg | ORAL_TABLET | Freq: Four times a day (QID) | ORAL | Status: DC | PRN

## 2013-12-18 MED ORDER — METRONIDAZOLE IN NACL 5-0.79 MG/ML-% IV SOLN
500.0000 mg | Freq: Three times a day (TID) | INTRAVENOUS | Status: DC
  Administered 2013-12-18 – 2013-12-20 (×7): 500 mg via INTRAVENOUS
  Filled 2013-12-18 (×9): qty 100

## 2013-12-18 MED ORDER — SODIUM CHLORIDE 0.9 % IV SOLN
INTRAVENOUS | Status: DC
  Administered 2013-12-18: 04:00:00 via INTRAVENOUS

## 2013-12-18 MED ORDER — OCUVITE-LUTEIN PO CAPS
2.0000 | ORAL_CAPSULE | Freq: Two times a day (BID) | ORAL | Status: DC
  Administered 2013-12-18 – 2013-12-20 (×5): 2 via ORAL
  Filled 2013-12-18 (×8): qty 2

## 2013-12-18 MED ORDER — PROBENECID 500 MG PO TABS
500.0000 mg | ORAL_TABLET | Freq: Every day | ORAL | Status: DC
  Administered 2013-12-18 – 2013-12-20 (×3): 500 mg via ORAL
  Filled 2013-12-18 (×3): qty 1

## 2013-12-18 MED ORDER — METRONIDAZOLE IN NACL 5-0.79 MG/ML-% IV SOLN
500.0000 mg | Freq: Once | INTRAVENOUS | Status: AC
  Administered 2013-12-18: 500 mg via INTRAVENOUS
  Filled 2013-12-18: qty 100

## 2013-12-18 MED ORDER — CIPROFLOXACIN IN D5W 400 MG/200ML IV SOLN
400.0000 mg | Freq: Once | INTRAVENOUS | Status: AC
  Administered 2013-12-18: 400 mg via INTRAVENOUS
  Filled 2013-12-18: qty 200

## 2013-12-18 MED ORDER — FOLIC ACID 1 MG PO TABS
1.0000 mg | ORAL_TABLET | Freq: Every day | ORAL | Status: DC
  Administered 2013-12-18 – 2013-12-20 (×3): 1 mg via ORAL
  Filled 2013-12-18 (×3): qty 1

## 2013-12-18 MED ORDER — SODIUM CHLORIDE 0.9 % IV SOLN
INTRAVENOUS | Status: AC
  Administered 2013-12-18: 1000 mL via INTRAVENOUS

## 2013-12-18 MED ORDER — HEPARIN SODIUM (PORCINE) 5000 UNIT/ML IJ SOLN
5000.0000 [IU] | Freq: Three times a day (TID) | INTRAMUSCULAR | Status: DC
  Administered 2013-12-18 – 2013-12-19 (×5): 5000 [IU] via SUBCUTANEOUS
  Filled 2013-12-18 (×5): qty 1

## 2013-12-18 MED ORDER — SODIUM CHLORIDE 0.9 % IV SOLN
INTRAVENOUS | Status: DC
  Administered 2013-12-18: 06:00:00 via INTRAVENOUS

## 2013-12-18 MED ORDER — POTASSIUM CHLORIDE CRYS ER 20 MEQ PO TBCR
40.0000 meq | EXTENDED_RELEASE_TABLET | Freq: Four times a day (QID) | ORAL | Status: AC
  Administered 2013-12-18 (×2): 40 meq via ORAL
  Filled 2013-12-18 (×2): qty 2

## 2013-12-18 MED ORDER — SODIUM CHLORIDE 0.9 % IJ SOLN
3.0000 mL | Freq: Two times a day (BID) | INTRAMUSCULAR | Status: DC
  Administered 2013-12-18 – 2013-12-20 (×5): 3 mL via INTRAVENOUS

## 2013-12-18 MED ORDER — IOHEXOL 300 MG/ML  SOLN
100.0000 mL | Freq: Once | INTRAMUSCULAR | Status: AC | PRN
  Administered 2013-12-18: 100 mL via INTRAVENOUS

## 2013-12-18 MED ORDER — CYANOCOBALAMIN 500 MCG PO TABS
500.0000 ug | ORAL_TABLET | Freq: Every day | ORAL | Status: DC
  Administered 2013-12-18 – 2013-12-20 (×3): 500 ug via ORAL
  Filled 2013-12-18 (×3): qty 1

## 2013-12-18 MED ORDER — ONDANSETRON HCL 4 MG/2ML IJ SOLN: 4.0000 mg | Freq: Three times a day (TID) | INTRAMUSCULAR | Status: DC | PRN

## 2013-12-18 MED ORDER — COLCHICINE 0.6 MG PO TABS
0.6000 mg | ORAL_TABLET | Freq: Every day | ORAL | Status: DC | PRN
  Filled 2013-12-18: qty 1

## 2013-12-18 MED ORDER — ONDANSETRON HCL 4 MG/2ML IJ SOLN
4.0000 mg | Freq: Once | INTRAMUSCULAR | Status: DC
  Filled 2013-12-18: qty 2

## 2013-12-18 MED ORDER — ONDANSETRON HCL 4 MG/2ML IJ SOLN
4.0000 mg | Freq: Once | INTRAMUSCULAR | Status: AC
  Administered 2013-12-18: 4 mg via INTRAVENOUS

## 2013-12-18 MED ORDER — PANTOPRAZOLE SODIUM 40 MG PO TBEC
40.0000 mg | DELAYED_RELEASE_TABLET | Freq: Every day | ORAL | Status: DC
  Administered 2013-12-18 – 2013-12-20 (×3): 40 mg via ORAL
  Filled 2013-12-18 (×3): qty 1

## 2013-12-18 MED ORDER — VITAMIN B12-FOLIC ACID 500-400 MCG PO TABS: 1.0000 | ORAL_TABLET | Freq: Every day | ORAL | Status: DC

## 2013-12-18 MED ORDER — LEVOTHYROXINE SODIUM 175 MCG PO TABS
175.0000 ug | ORAL_TABLET | Freq: Every day | ORAL | Status: DC
  Administered 2013-12-18 – 2013-12-20 (×3): 175 ug via ORAL
  Filled 2013-12-18 (×4): qty 1

## 2013-12-18 MED ORDER — VITAMIN D 1000 UNITS PO TABS
2000.0000 [IU] | ORAL_TABLET | Freq: Every day | ORAL | Status: DC
  Administered 2013-12-18 – 2013-12-20 (×3): 2000 [IU] via ORAL
  Filled 2013-12-18 (×3): qty 2

## 2013-12-18 NOTE — ED Notes (Signed)
Gave pt ice water °

## 2013-12-18 NOTE — ED Notes (Signed)
XRAY called and informed pt is ready for XRAY.

## 2013-12-18 NOTE — ED Notes (Signed)
Called CT and informed them the pt has finished his contrast

## 2013-12-18 NOTE — ED Notes (Signed)
Admitting page to inform of updated rectal temp, please adm tylenol at 0530 as PRN order allows. Bolus fluids.

## 2013-12-18 NOTE — Care Management Note (Addendum)
    Page 1 of 1   12/20/2013     1:57:50 PM CARE MANAGEMENT NOTE 12/20/2013  Patient:  Samuel Little, Samuel Little   Account Number:  [id number]  Date Initiated:  12/18/2013  Documentation initiated by:  [facility]  Subjective/Objective Assessment:   81 y.o. male with PMH of hypertension, hypothyroidism, AVNRT status post ablation, gout, using multiple herbal medication, who presents fever, chills, nausea, vomiting.//Home with spouse.     Action/Plan:   admit to Tele bed given RBBB/LAFB and tachycardia; IV fluids  - ED started IV Flagyl and ciprofloxacin, will continue  - blood culture x 2//Access for disposition needs/   Anticipated DC Date:  12/21/2013   Anticipated DC Plan:  [place]  CM consult      Choice offered to / List presented to:             Status of service:  In process, will continue to follow Medicare Important Message given?  YES (If response is "NO", the following Medicare IM given date fields will be blank) Date Medicare IM given:  12/20/2013 Medicare IM given by:  [facility] Date Additional Medicare IM given:   Additional Medicare IM given by:    Discharge Disposition:    Per UR Regulation:  Reviewed for med. necessity/level of care/duration of stay  If discussed at [place] of Stay Meetings, dates discussed:    Comments:

## 2013-12-18 NOTE — ED Notes (Signed)
Admitting MD at bedside.

## 2013-12-18 NOTE — H&P (Addendum)
[facility] History and Physical  Samuel Little JQB:[id number] DOB: 1932-07-01 DOA: 12/17/2013  Referring physician: ED physician PCP: Wenda Low, MD  Specialists:   Chief Complaint: Fever, chills, nausea, vomiting.  HPI: KOHEI ANTONELLIS is a 80 y.o. male with PMH of hypertension, hypothyroidism, AVNRT status post ablation, gout, using multiple herbal medication, who presents fever, chills, nausea, vomiting.  The patient reports that he suddenly started feeling cold and having chills when he was watching TV at home at about 7:30 PM. Then he developed nausea, and vomited for 3-4 times, without blood in the vomitus. He does have abdominal pain or diarrhea. He reports to ED physician that he had "rawness" in his chest, but denies chest pain to me. He does not have cough, chest pain, shortness of breath, hematochezia, hematuria. No symptoms of UTI. No sick contacts or recent travel.   Patient was found to have diverticulitis on CT abdomen/pelvis. He has temperature 101.1 and tachycardia. He is admitted to inpatient for further evaluation and treatment.  Review of Systems: As presented in the history of presenting illness, rest negative.  Where does patient live? Lives with his wife at home   Can patient participate in ADLs? Yes  Allergy:  Allergies  Allergen Reactions  . Celecoxib Nausea And Vomiting  . Meperidine Hcl     REACTION: Nausea  . Nsaids Other (See Comments)    jaundice    Past Medical History  Diagnosis Date  . Hypertension   . Thyroid disease     hypothyroidism  . Gout   . Pleural effusion, left   . Gastroesophageal reflux disease   . Arthritis   . 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 No pertinent past surgical history.  Social History:  reports that he has never smoked. He does not have any smokeless tobacco history on file. He reports that he  does not drink alcohol or use illicit drugs.  Family History: Mother with DM-II and HTN; father with HTN  Prior to Admission medications   Medication Sig Start Date End Date Taking? Authorizing Provider  Acetaminophen (TYLENOL ARTHRITIS PAIN PO) Take 1 tablet by mouth daily as needed (pain).    Yes Historical Provider, MD  Ascorbic Acid (VITAMIN C) 500 MG CAPS Take 500 mg by mouth daily.    Yes Historical Provider, MD  aspirin EC 81 MG tablet Take 81 mg by mouth daily.   Yes Historical Provider, MD  Bilberry, Vaccinium myrtillus, (BILBERRY PO) Take 1 tablet by mouth daily.    Yes Historical Provider, MD  Calcium-Vitamin D (CALTRATE 600 PLUS-VIT D PO) Take 1 tablet by mouth daily.    Yes Historical Provider, MD  Cholecalciferol (VITAMIN D) 2000 UNITS tablet Take 2,000 Units by mouth daily.   Yes Historical Provider, MD  Cobalamine Combinations (VITAMIN B12-FOLIC ACID PO) Take 1 tablet by mouth daily.    Yes Historical Provider, MD  Coenzyme Q10 (CO Q 10 PO) Take 1 tablet by mouth daily.    Yes Historical Provider, MD  colchicine 0.6 MG tablet Take 0.6 mg by mouth daily as needed (gout).    Yes Historical Provider, MD  CORAL CALCIUM PO Take 2 tablets by mouth daily.   Yes Historical Provider, MD  CRANBERRY PO Take 1 tablet by mouth daily.    Yes Historical Provider, MD  doxazosin (CARDURA) 4 MG tablet Take 4 mg by mouth daily.  Yes Historical Provider, MD  GINKGO BILOBA PO Take 1 tablet by mouth daily.   Yes Historical Provider, MD  Glucos-Chondroit-Hyaluron-MSM (GLUCOSAMINE CHONDROITIN JOINT PO) Take 1 tablet by mouth daily.    Yes Historical Provider, MD  KRILL OIL ULTRA STRENGTH PO Take 1 tablet by mouth daily.    Yes Historical Provider, MD  levothyroxine (SYNTHROID, LEVOTHROID) 175 MCG tablet Take 175 mcg by mouth daily before breakfast.   Yes Historical Provider, MD  lisinopril-hydrochlorothiazide (PRINZIDE,ZESTORETIC) 20-12.5 MG per tablet Take 1 tablet by mouth daily.   Yes Historical  Provider, MD  Misc Natural Products (BLACK CHERRY CONCENTRATE PO) Take 1 tablet by mouth daily.    Yes Historical Provider, MD  multivitamin-lutein (OCUVITE-LUTEIN) CAPS capsule Take 2 capsules by mouth 2 (two) times daily.    Yes Historical Provider, MD  omeprazole (PRILOSEC) 20 MG capsule Take 20 mg by mouth daily.   Yes Historical Provider, MD  probenecid (BENEMID) 500 MG tablet Take 500 mg by mouth daily.   Yes Historical Provider, MD  RESVERATROL PO Take 1 tablet by mouth daily.    Yes Historical Provider, MD  traMADol (ULTRAM) 50 MG tablet Take 50 mg by mouth every 6 (six) hours as needed for moderate pain.    Yes Historical Provider, MD  TURMERIC PO Take 1 tablet by mouth daily.    Yes Historical Provider, MD    Physical Exam: Filed Vitals:   12/18/13 0330 12/18/13 0345 12/18/13 0400 12/18/13 0410  BP: 107/49 97/77 102/55   Pulse: 93 92 85   Temp:    102.3 F (39.1 C)  TempSrc:    Rectal  Resp: 16 21 27    SpO2: 94% 96% 94%    General: Not in acute distress HEENT:       Eyes: PERRL, EOMI, no scleral icterus       ENT: No discharge from the ears and nose, no pharynx injection, no tonsillar enlargement.        Neck: No JVD, no bruit, no mass felt. Cardiac: S1/S2, RRR, tachycardia, No murmurs, gallops or rubs Pulm: Good air movement bilaterally. Clear to auscultation bilaterally. No rales, wheezing, rhonchi or rubs. Abd: Soft, nondistended, mild tenderness over epigastric and right side of abdomen, no rebound pain, no organomegaly, BS present Ext: No edema. 2+DP/PT pulse bilaterally Musculoskeletal: No joint deformities, erythema, or stiffness, ROM full Skin: No rashes.  Neuro: Alert and oriented X3, cranial nerves II-XII grossly intact, muscle strength 5/5 in all extremeties, sensation to light touch intact.  Psych: Patient is not psychotic, no suicidal or hemocidal ideation.  Labs on Admission:  Basic Metabolic Panel:  Recent Labs Lab 12/17/13 2325  NA 142  K 3.8  CL  102  CO2 24  GLUCOSE 101*  BUN 20  CREATININE 1.35  CALCIUM 9.4   Liver Function Tests:  Recent Labs Lab 12/17/13 2325  AST 37  ALT 27  ALKPHOS 78  BILITOT 1.3*  PROT 6.7  ALBUMIN 3.5   No results found for this basename: LIPASE, AMYLASE,  in the last 168 hours No results found for this basename: AMMONIA,  in the last 168 hours CBC:  Recent Labs Lab 12/17/13 2325  WBC 4.1  NEUTROABS 3.7  HGB 14.2  HCT 40.8  MCV 94.2  PLT 135*   Cardiac Enzymes: No results found for this basename: CKTOTAL, CKMB, CKMBINDEX, TROPONINI,  in the last 168 hours  BNP (last 3 results) No results found for this basename: PROBNP,  in the last 8760  hours CBG: No results found for this basename: GLUCAP,  in the last 168 hours  Radiological Exams on Admission: Dg Chest 2 View  12/18/2013   CLINICAL DATA:  Acute onset of fever, vomiting and chest pain tonight. Initial encounter.  EXAM: CHEST  2 VIEW  COMPARISON:  Chest radiograph performed 11/04/2009  FINDINGS: The lungs are well-aerated and clear. There is no evidence of focal opacification, pleural effusion or pneumothorax.  The heart is normal in size; the mediastinal contour is within normal limits. No acute osseous abnormalities are seen. A mild chronic compression deformity is suggested at the lower thoracic spine.  IMPRESSION: No acute cardiopulmonary process seen.   Electronically Signed   By: Garald Balding M.D.   On: 12/18/2013 01:20   Ct Abdomen Pelvis W Contrast  12/18/2013   CLINICAL DATA:  Acute onset of right-sided abdominal pain. Nausea and vomiting. Initial encounter.  EXAM: CT ABDOMEN AND PELVIS WITH CONTRAST  TECHNIQUE: Multidetector CT imaging of the abdomen and pelvis was performed using the standard protocol following bolus administration of intravenous contrast.  CONTRAST:  137mL OMNIPAQUE IOHEXOL 300 MG/ML  SOLN  COMPARISON:  None.  FINDINGS: The visualized lung bases are clear.  The liver and spleen are unremarkable in  appearance. The gallbladder is within normal limits. The pancreas and adrenal glands are unremarkable.  Scattered bilateral nonobstructing renal stones are seen, measuring up to 7 mm in size. Nonspecific perinephric stranding is noted bilaterally. A 0.9 cm cm cyst is seen at the posterior aspect of the right kidney. There is no evidence of hydronephrosis. No obstructing ureteral stones are seen.  No free fluid is identified. The small bowel is unremarkable in appearance. The stomach is within normal limits. No acute vascular abnormalities are seen.  The appendix is normal in caliber and contains trace air, without evidence for appendicitis.  Mild focal soft tissue inflammation is noted about the cecum, with mild cecal wall thickening, and associated inflamed diverticula, compatible with acute diverticulitis. Scattered diverticulosis is noted along the entirety of the colon, most prominent about the cecum and descending colon. There is no evidence of perforation or abscess formation at this time.  The bladder is mildly distended and grossly unremarkable. The prostate is enlarged, measuring 6.4 cm in transverse dimension. No inguinal lymphadenopathy is seen.  Small bilateral inguinal hernias are seen, containing only fat.  No acute osseous abnormalities are identified. Multilevel disc space narrowing and vacuum phenomenon is noted along the lower thoracic and lumbar spine, with associated degenerative change.  IMPRESSION: 1. Mild focal acute diverticulitis at the cecum, with mild associated cecal wall thickening. Mild soft tissue inflammation noted, without evidence of perforation or abscess formation at this time. 2. Scattered diverticulosis noted along the entirety of the colon, most prominent about the cecum and descending colon. 3. Scattered nonobstructing bilateral renal stones, measuring up to 7 mm in size. 4. Tiny right renal cyst seen. 5. Enlarged prostate noted. 6. Small bilateral inguinal hernias, containing  only fat. 7. Mild diffuse degenerative change noted along the lower thoracic and lumbar spine.   Electronically Signed   By: Garald Balding M.D.   On: 12/18/2013 02:01    EKG: Independently reviewed. 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  Assessment/Plan Principal Problem:   Diverticulitis Active Problems:   Hypothyroidism   Essential hypertension   PSVT   GERD   Gout   Sepsis  1. Diverticulitis and sepsis: as evidenced by CT abdomen, no perforation or abscess. Patient has mild  sepsis with fever, thrombocytopenia, tachycardia and soft blood pressure.   - will admit to Tele bed given RBBB/LAFB and tachycardia - ED started IV Flagyl and ciprofloxacin, will continue - blood culture x 2 - hold Prinzide and doxazosin given her soft bp and risk of developing septic shock - NPO now - Zofran for nausea - IVF: received 2 L NS bolus in ED, will continue NS 125 cc/h; will bolus NS if bp drops  2. HTN: on Prinzide at home. Now Bp is soft. - hold Prinzide as above  3. GERD: continue PPI  4. Gout: stable - continue colchicine  5. Hypothyroidism: Last TSH was 2.537 on 10/15/09 -Continue Synthroid -Check TSH   6. CKD-III: baseline cre 0.9 to 1.36. His cre is 1.35 on admission -monitor renal function by BMP  DVT ppx: SQ Heparin   Code Status: Full code Family Communication: Yes, patient's wife at bed side Disposition Plan: Admit to inpatient  Ivor Costa [facility] Pager [phone]  If 7PM-7AM, please contact night-coverage [email or web address] Password [email or web address] 12/18/2013, 4:21 AM

## 2013-12-18 NOTE — Progress Notes (Addendum)
[facility] PROGRESS NOTE  Samuel Little XKG:[id number] DOB: [birth year] DOA: 12/17/2013 PCP: Wenda Low, MD  Assessment/Plan: Acute cecal diverticulitis Symptomatically improved. continue empiric cipro and flagyl. continue IV hydration. Supportive care with antiemetics and pain medications. No further nausea or vomiting. Start on clear liquid. Follow blood cx  Hypotension Hold BP meds given low normal BP   BPH Reports urgency, urge incontinence and nocturia symptoms. Enlarged prostate seen on CT. Add flomax once BP stable.  Hx of AVNRT Stable on tele. Follows with Dr Marlou Porch  GERD  PPI  Hypothyroidism  continue synthroid  Non obstructing b/l renal calculi Asymptomatic. Follow as outpt   Code Status: full Family Communication: will notify wife Disposition Plan: home possibly in 1 -2 days   Consultants:  none  Procedures:  none  Antibiotics:  IV cipro and flagyl 10/7--  HPI/Subjective: Abdominal pain better. No N/V  Objective: Filed Vitals:   12/18/13 0511  BP: 99/47  Pulse: 84  Temp: 98.4 F (36.9 C)  Resp: 24    Intake/Output Summary (Last 24 hours) at 12/18/13 0815 Last data filed at 12/18/13 0658  Gross per 24 hour  Intake 1158.33 ml  Output      0 ml  Net 1158.33 ml   Filed Weights   12/18/13 0511  Weight: 95.709 kg (211 lb)    Exam:   General:  NAD  HEENT: moist mucosa  Cardiovascular: N S1 S2, no murmurs  Respiratory: clear b/l  Abdomen: soft, ND, BS+, mild RLQ tenderness  Musculoskeletal: warm, no edema   Data Reviewed: Basic Metabolic Panel:  Recent Labs Lab 12/17/13 2325  NA 142  K 3.8  CL 102  CO2 24  GLUCOSE 101*  BUN 20  CREATININE 1.35  CALCIUM 9.4   Liver Function Tests:  Recent Labs Lab 12/17/13 2325  AST 37  ALT 27  ALKPHOS 78  BILITOT 1.3*  PROT 6.7  ALBUMIN 3.5   No results found for this basename: LIPASE, AMYLASE,  in the last 168 hours No results found for this basename:  AMMONIA,  in the last 168 hours CBC:  Recent Labs Lab 12/17/13 2325 12/18/13 0720  WBC 4.1 14.2*  NEUTROABS 3.7  --   HGB 14.2 12.8*  HCT 40.8 36.5*  MCV 94.2 93.8  PLT 135* PENDING   Cardiac Enzymes: No results found for this basename: CKTOTAL, CKMB, CKMBINDEX, TROPONINI,  in the last 168 hours BNP (last 3 results) No results found for this basename: PROBNP,  in the last 8760 hours CBG: No results found for this basename: GLUCAP,  in the last 168 hours  No results found for this or any previous visit (from the past 240 hour(s)).   Studies: Dg Chest 2 View  12/18/2013   CLINICAL DATA:  Acute onset of fever, vomiting and chest pain tonight. Initial encounter.  EXAM: CHEST  2 VIEW  COMPARISON:  Chest radiograph performed 11/04/2009  FINDINGS: The lungs are well-aerated and clear. There is no evidence of focal opacification, pleural effusion or pneumothorax.  The heart is normal in size; the mediastinal contour is within normal limits. No acute osseous abnormalities are seen. A mild chronic compression deformity is suggested at the lower thoracic spine.  IMPRESSION: No acute cardiopulmonary process seen.   Electronically Signed   By: Garald Balding M.D.   On: 12/18/2013 01:20   Ct Abdomen Pelvis W Contrast  12/18/2013   CLINICAL DATA:  Acute onset of right-sided abdominal pain. Nausea and vomiting. Initial encounter.  EXAM:  CT ABDOMEN AND PELVIS WITH CONTRAST  TECHNIQUE: Multidetector CT imaging of the abdomen and pelvis was performed using the standard protocol following bolus administration of intravenous contrast.  CONTRAST:  147mL OMNIPAQUE IOHEXOL 300 MG/ML  SOLN  COMPARISON:  None.  FINDINGS: The visualized lung bases are clear.  The liver and spleen are unremarkable in appearance. The gallbladder is within normal limits. The pancreas and adrenal glands are unremarkable.  Scattered bilateral nonobstructing renal stones are seen, measuring up to 7 mm in size. Nonspecific perinephric  stranding is noted bilaterally. A 0.9 cm cm cyst is seen at the posterior aspect of the right kidney. There is no evidence of hydronephrosis. No obstructing ureteral stones are seen.  No free fluid is identified. The small bowel is unremarkable in appearance. The stomach is within normal limits. No acute vascular abnormalities are seen.  The appendix is normal in caliber and contains trace air, without evidence for appendicitis.  Mild focal soft tissue inflammation is noted about the cecum, with mild cecal wall thickening, and associated inflamed diverticula, compatible with acute diverticulitis. Scattered diverticulosis is noted along the entirety of the colon, most prominent about the cecum and descending colon. There is no evidence of perforation or abscess formation at this time.  The bladder is mildly distended and grossly unremarkable. The prostate is enlarged, measuring 6.4 cm in transverse dimension. No inguinal lymphadenopathy is seen.  Small bilateral inguinal hernias are seen, containing only fat.  No acute osseous abnormalities are identified. Multilevel disc space narrowing and vacuum phenomenon is noted along the lower thoracic and lumbar spine, with associated degenerative change.  IMPRESSION: 1. Mild focal acute diverticulitis at the cecum, with mild associated cecal wall thickening. Mild soft tissue inflammation noted, without evidence of perforation or abscess formation at this time. 2. Scattered diverticulosis noted along the entirety of the colon, most prominent about the cecum and descending colon. 3. Scattered nonobstructing bilateral renal stones, measuring up to 7 mm in size. 4. Tiny right renal cyst seen. 5. Enlarged prostate noted. 6. Small bilateral inguinal hernias, containing only fat. 7. Mild diffuse degenerative change noted along the lower thoracic and lumbar spine.   Electronically Signed   By: Garald Balding M.D.   On: 12/18/2013 02:01    Scheduled Meds: . aspirin EC  81 mg Oral  Daily  . cholecalciferol  2,000 Units Oral Daily  . ciprofloxacin  400 mg Intravenous Q12H  . vitamin B-12  500 mcg Oral Daily  . folic acid  1 mg Oral Daily  . heparin  5,000 Units Subcutaneous 3 times per day  . [START ON 12/19/2013] Influenza vac split quadrivalent PF  0.5 mL Intramuscular Tomorrow-1000  . levothyroxine  175 mcg Oral QAC breakfast  . metronidazole  500 mg Intravenous Q8H  . multivitamin-lutein  2 capsule Oral BID  . pantoprazole  40 mg Oral Daily  . probenecid  500 mg Oral Daily  . sodium chloride  3 mL Intravenous Q12H   Continuous Infusions: . sodium chloride         Time spent:25 minutes    Jase Reep, [place]  [facility] Pager [phone]. If 7PM-7AM, please contact night-coverage at [email or web address], password [facility] 12/18/2013, 8:15 AM  LOS: 1 day

## 2013-12-19 DIAGNOSIS — A4151 Sepsis due to Escherichia coli [E. coli]: Secondary | ICD-10-CM | POA: Diagnosis not present

## 2013-12-19 DIAGNOSIS — R7881 Bacteremia: Secondary | ICD-10-CM | POA: Diagnosis present

## 2013-12-19 DIAGNOSIS — I10 Essential (primary) hypertension: Secondary | ICD-10-CM

## 2013-12-19 LAB — BASIC METABOLIC PANEL
ANION GAP: 13 (ref 5–15)
BUN: 21 mg/dL (ref 6–23)
CHLORIDE: 105 meq/L (ref 96–112)
CO2: 19 mEq/L (ref 19–32)
Calcium: 8.4 mg/dL (ref 8.4–10.5)
Creatinine, Ser: 1.28 mg/dL (ref 0.50–1.35)
GFR, EST AFRICAN AMERICAN: 60 mL/min — AB (ref >90)
GFR, EST NON AFRICAN AMERICAN: 52 mL/min — AB (ref >90)
Glucose, Bld: 76 mg/dL (ref 70–99)
Potassium: 4.3 mEq/L (ref 3.7–5.3)
Sodium: 137 mEq/L (ref 137–147)

## 2013-12-19 LAB — CBC
HEMATOCRIT: 36.1 % — AB (ref 39.0–52.0)
Hemoglobin: 12.3 g/dL — ABNORMAL LOW (ref 13.0–17.0)
MCH: 32.4 pg (ref 26.0–34.0)
MCHC: 34.1 g/dL (ref 30.0–36.0)
MCV: 95 fL (ref 78.0–100.0)
PLATELETS: 102 10*3/uL — AB (ref 150–400)
RBC: 3.8 MIL/uL — ABNORMAL LOW (ref 4.22–5.81)
RDW: 14.6 % (ref 11.5–15.5)
WBC: 12.4 10*3/uL — AB (ref 4.0–10.5)

## 2013-12-19 MED ORDER — TAMSULOSIN HCL 0.4 MG PO CAPS
0.4000 mg | ORAL_CAPSULE | Freq: Every day | ORAL | Status: DC
  Administered 2013-12-19: 0.4 mg via ORAL
  Filled 2013-12-19 (×2): qty 1

## 2013-12-19 NOTE — Progress Notes (Addendum)
[facility] PROGRESS NOTE  Samuel Little KGY:[id number] DOB: 07/02/1932 DOA: 12/17/2013 PCP: Wenda Low, MD   brief narrative 80 y/o hypertension, hypothyroidism, AVNRT status post ablation, gout, using multiple herbal medication, who presented with  fever, chills, nausea, vomiting of 1 day duration. Patient was found to have cecal diverticulitis on CT abdomen/pelvis. He has temperature 101.1 and tachycardia. He is admitted to inpatient for further evaluation and treatment.     Assessment/Plan: Acute cecal diverticulitis Symptomatically improved. continue empiric cipro and flagyl. D/c fluids. Supportive care with antiemetics and pain medications. No further nausea or vomiting. Advance diet to full liquid. Follow blood cx.  ecoli bacteremia Possible source is the colon. On empiric ciprofloxacin and flagyl. Follow sensitivity. UA negative.  Hypotension Hold BP meds given low normal BP on admission. Will resume in am   BPH Reports urgency, urge incontinence and nocturia symptoms. Enlarged prostate seen on CT. Add flomax   Hx of AVNRT Stable on tele. Follows with Dr Marlou Porch  GERD  PPI  Hypothyroidism  continue synthroid  Non obstructing b/l renal calculi Asymptomatic. Follow as outpt  Thrombocytopenia Mild.  possibly due to infection. On SCDs   Code Status: full Family Communication: spoke with  wife Disposition Plan: home possibly in 1 -2 days depending upon improvement and final blood cx results   Consultants:  none  Procedures:  none  Antibiotics:  IV cipro and flagyl 10/7--  HPI/Subjective: Abdominal pain slightly better. No N/V  Objective: Filed Vitals:   12/19/13 1029  BP: 123/67  Pulse: 69  Temp: 98.2 F (36.8 C)  Resp: 18    Intake/Output Summary (Last 24 hours) at 12/19/13 1402 Last data filed at 12/19/13 1400  Gross per 24 hour  Intake   2620 ml  Output   2875 ml  Net   -255 ml   Filed Weights   12/18/13 0511 12/19/13 0611   Weight: 95.709 kg (211 lb) 96.5 kg (212 lb 11.9 oz)    Exam:   General:  NAD  HEENT: moist oral mucosa  Cardiovascular: N S1 S2, no murmurs  Respiratory: clear b/l  Abdomen: soft, ND, BS+, mild RLQ tenderness  Musculoskeletal: warm, no edema   Data Reviewed: Basic Metabolic Panel:  Recent Labs Lab 12/17/13 2325 12/18/13 0720 12/19/13 0500  NA 142 139 137  K 3.8 3.5* 4.3  CL 102 104 105  CO2 24 22 19   GLUCOSE 101* 119* 76  BUN 20 20 21   CREATININE 1.35 1.32 1.28  CALCIUM 9.4 8.6 8.4   Liver Function Tests:  Recent Labs Lab 12/17/13 2325  AST 37  ALT 27  ALKPHOS 78  BILITOT 1.3*  PROT 6.7  ALBUMIN 3.5    Recent Labs Lab 12/18/13 0720  LIPASE 25   No results found for this basename: AMMONIA,  in the last 168 hours CBC:  Recent Labs Lab 12/17/13 2325 12/18/13 0720 12/19/13 0500  WBC 4.1 14.2* 12.4*  NEUTROABS 3.7  --   --   HGB 14.2 12.8* 12.3*  HCT 40.8 36.5* 36.1*  MCV 94.2 93.8 95.0  PLT 135* 105* 102*   Cardiac Enzymes: No results found for this basename: CKTOTAL, CKMB, CKMBINDEX, TROPONINI,  in the last 168 hours BNP (last 3 results) No results found for this basename: PROBNP,  in the last 8760 hours CBG: No results found for this basename: GLUCAP,  in the last 168 hours  Recent Results (from the past 240 hour(s))  CULTURE, BLOOD (ROUTINE X 2)  Status: None   Collection Time    12/17/13 11:55 PM      Result Value Ref Range Status   Specimen Description BLOOD RIGHT ARM   Final   Special Requests BOTTLES DRAWN AEROBIC AND ANAEROBIC 10CC   Final   Culture  Setup Time     Final   Value: 12/18/2013 10:11     Performed at [facility]   Culture     Final   Value: ESCHERICHIA COLI     Note: Gram Stain Report Called to,Read Back By and Verified With: GLADYS RAFUL ON 12/18/2013 AT 9:13P BY WILEJ     Performed at [facility]   Report Status PENDING   Incomplete  CULTURE, BLOOD (ROUTINE X 2)     Status: None    Collection Time    12/18/13 12:02 AM      Result Value Ref Range Status   Specimen Description BLOOD RIGHT HAND   Final   Special Requests BOTTLES DRAWN AEROBIC ONLY 4CC   Final   Culture  Setup Time     Final   Value: 12/18/2013 10:30     Performed at [facility]   Culture     Final   Value: ESCHERICHIA COLI     Note: Gram Stain Report Called to,Read Back By and Verified With: GLADYS RAFUL ON 12/18/2013 AT 9:13P BY WILEJ     Performed at [facility]   Report Status PENDING   Incomplete  URINE CULTURE     Status: None   Collection Time    12/18/13 12:36 AM      Result Value Ref Range Status   Specimen Description URINE, CATHETERIZED   Final   Special Requests NONE   Final   Culture  Setup Time     Final   Value: 12/18/2013 10:39     Performed at [place] PENDING   Incomplete   Culture     Final   Value: Culture reincubated for better growth     Performed at [facility]   Report Status PENDING   Incomplete     Studies: Dg Chest 2 View  12/18/2013   CLINICAL DATA:  Acute onset of fever, vomiting and chest pain tonight. Initial encounter.  EXAM: CHEST  2 VIEW  COMPARISON:  Chest radiograph performed 11/04/2009  FINDINGS: The lungs are well-aerated and clear. There is no evidence of focal opacification, pleural effusion or pneumothorax.  The heart is normal in size; the mediastinal contour is within normal limits. No acute osseous abnormalities are seen. A mild chronic compression deformity is suggested at the lower thoracic spine.  IMPRESSION: No acute cardiopulmonary process seen.   Electronically Signed   By: Garald Balding M.D.   On: 12/18/2013 01:20   Ct Abdomen Pelvis W Contrast  12/18/2013   CLINICAL DATA:  Acute onset of right-sided abdominal pain. Nausea and vomiting. Initial encounter.  EXAM: CT ABDOMEN AND PELVIS WITH CONTRAST  TECHNIQUE: Multidetector CT imaging of the abdomen and pelvis was performed using the standard  protocol following bolus administration of intravenous contrast.  CONTRAST:  150mL OMNIPAQUE IOHEXOL 300 MG/ML  SOLN  COMPARISON:  None.  FINDINGS: The visualized lung bases are clear.  The liver and spleen are unremarkable in appearance. The gallbladder is within normal limits. The pancreas and adrenal glands are unremarkable.  Scattered bilateral nonobstructing renal stones are seen, measuring up to 7 mm in size. Nonspecific perinephric stranding  is noted bilaterally. A 0.9 cm cm cyst is seen at the posterior aspect of the right kidney. There is no evidence of hydronephrosis. No obstructing ureteral stones are seen.  No free fluid is identified. The small bowel is unremarkable in appearance. The stomach is within normal limits. No acute vascular abnormalities are seen.  The appendix is normal in caliber and contains trace air, without evidence for appendicitis.  Mild focal soft tissue inflammation is noted about the cecum, with mild cecal wall thickening, and associated inflamed diverticula, compatible with acute diverticulitis. Scattered diverticulosis is noted along the entirety of the colon, most prominent about the cecum and descending colon. There is no evidence of perforation or abscess formation at this time.  The bladder is mildly distended and grossly unremarkable. The prostate is enlarged, measuring 6.4 cm in transverse dimension. No inguinal lymphadenopathy is seen.  Small bilateral inguinal hernias are seen, containing only fat.  No acute osseous abnormalities are identified. Multilevel disc space narrowing and vacuum phenomenon is noted along the lower thoracic and lumbar spine, with associated degenerative change.  IMPRESSION: 1. Mild focal acute diverticulitis at the cecum, with mild associated cecal wall thickening. Mild soft tissue inflammation noted, without evidence of perforation or abscess formation at this time. 2. Scattered diverticulosis noted along the entirety of the colon, most prominent  about the cecum and descending colon. 3. Scattered nonobstructing bilateral renal stones, measuring up to 7 mm in size. 4. Tiny right renal cyst seen. 5. Enlarged prostate noted. 6. Small bilateral inguinal hernias, containing only fat. 7. Mild diffuse degenerative change noted along the lower thoracic and lumbar spine.   Electronically Signed   By: Garald Balding M.D.   On: 12/18/2013 02:01    Scheduled Meds: . aspirin EC  81 mg Oral Daily  . cholecalciferol  2,000 Units Oral Daily  . ciprofloxacin  400 mg Intravenous Q12H  . vitamin B-12  500 mcg Oral Daily  . folic acid  1 mg Oral Daily  . levothyroxine  175 mcg Oral QAC breakfast  . metronidazole  500 mg Intravenous Q8H  . multivitamin-lutein  2 capsule Oral BID  . pantoprazole  40 mg Oral Daily  . probenecid  500 mg Oral Daily  . sodium chloride  3 mL Intravenous Q12H  . tamsulosin  0.4 mg Oral QPC supper   Continuous Infusions:       Time spent:25 minutes    Emran Molzahn, [place]  [facility] Pager [phone]. If 7PM-7AM, please contact night-coverage at [email or web address], password [facility] 12/19/2013, 2:02 PM  LOS: 2 days

## 2013-12-19 NOTE — Progress Notes (Signed)
Notified md Dhungel of rising BP. Patient stated that bp was low upon admission, so meds may have not been continued.

## 2013-12-20 DIAGNOSIS — K5732 Diverticulitis of large intestine without perforation or abscess without bleeding: Secondary | ICD-10-CM | POA: Diagnosis present

## 2013-12-20 DIAGNOSIS — A4151 Sepsis due to Escherichia coli [E. coli]: Principal | ICD-10-CM

## 2013-12-20 DIAGNOSIS — R7881 Bacteremia: Secondary | ICD-10-CM

## 2013-12-20 DIAGNOSIS — N400 Enlarged prostate without lower urinary tract symptoms: Secondary | ICD-10-CM | POA: Diagnosis present

## 2013-12-20 LAB — CBC
HCT: 37.1 % — ABNORMAL LOW (ref 39.0–52.0)
HEMOGLOBIN: 12.8 g/dL — AB (ref 13.0–17.0)
MCH: 33.2 pg (ref 26.0–34.0)
MCHC: 34.5 g/dL (ref 30.0–36.0)
MCV: 96.1 fL (ref 78.0–100.0)
PLATELETS: 96 10*3/uL — AB (ref 150–400)
RBC: 3.86 MIL/uL — ABNORMAL LOW (ref 4.22–5.81)
RDW: 14.2 % (ref 11.5–15.5)
WBC: 7.9 10*3/uL (ref 4.0–10.5)

## 2013-12-20 LAB — CULTURE, BLOOD (ROUTINE X 2)

## 2013-12-20 LAB — T4, FREE: FREE T4: 1.13 ng/dL (ref 0.80–1.80)

## 2013-12-20 MED ORDER — LEVOTHYROXINE SODIUM 150 MCG PO TABS
150.0000 ug | ORAL_TABLET | Freq: Every day | ORAL | Status: DC
  Filled 2013-12-20: qty 1

## 2013-12-20 MED ORDER — TAMSULOSIN HCL 0.4 MG PO CAPS: 0.4000 mg | ORAL_CAPSULE | Freq: Every day | ORAL | Status: DC

## 2013-12-20 MED ORDER — METRONIDAZOLE 500 MG PO TABS: 500.0000 mg | ORAL_TABLET | Freq: Three times a day (TID) | ORAL | Status: AC

## 2013-12-20 MED ORDER — SENNA 8.6 MG PO TABS: 1.0000 | ORAL_TABLET | Freq: Every evening | ORAL | Status: DC | PRN

## 2013-12-20 MED ORDER — SULFAMETHOXAZOLE-TMP DS 800-160 MG PO TABS: 1.0000 | ORAL_TABLET | Freq: Two times a day (BID) | ORAL | Status: AC

## 2013-12-20 MED ORDER — HYDROCHLOROTHIAZIDE 12.5 MG PO CAPS
12.5000 mg | ORAL_CAPSULE | Freq: Every day | ORAL | Status: DC
Start: 2013-12-20 — End: 2013-12-20
  Administered 2013-12-20: 12.5 mg via ORAL
  Filled 2013-12-20: qty 1

## 2013-12-20 MED ORDER — LISINOPRIL-HYDROCHLOROTHIAZIDE 20-12.5 MG PO TABS: 1.0000 | ORAL_TABLET | Freq: Every day | ORAL | Status: DC

## 2013-12-20 MED ORDER — SENNOSIDES-DOCUSATE SODIUM 8.6-50 MG PO TABS
2.0000 | ORAL_TABLET | Freq: Once | ORAL | Status: AC
  Administered 2013-12-20: 2 via ORAL
  Filled 2013-12-20 (×2): qty 2

## 2013-12-20 MED ORDER — LISINOPRIL 20 MG PO TABS
20.0000 mg | ORAL_TABLET | Freq: Every day | ORAL | Status: DC
  Administered 2013-12-20: 20 mg via ORAL
  Filled 2013-12-20: qty 1

## 2013-12-20 NOTE — Progress Notes (Signed)
Patient discharged instructions completed by charge nurse. Meds reviewed with patient and wife. Verbalizes understanding.

## 2013-12-20 NOTE — Discharge Instructions (Addendum)
Diverticulitis °Diverticulitis is inflammation or infection of small pouches in your colon that form when you have a condition called diverticulosis. The pouches in your colon are called diverticula. Your colon, or large intestine, is where water is absorbed and stool is formed. °Complications of diverticulitis can include: °· Bleeding. °· Severe infection. °· Severe pain. °· Perforation of your colon. °· Obstruction of your colon. °CAUSES  °Diverticulitis is caused by bacteria. °Diverticulitis happens when stool becomes trapped in diverticula. This allows bacteria to grow in the diverticula, which can lead to inflammation and infection. °RISK FACTORS °People with diverticulosis are at risk for diverticulitis. Eating a diet that does not include enough fiber from fruits and vegetables may make diverticulitis more likely to develop. °SYMPTOMS  °Symptoms of diverticulitis may include: °· Abdominal pain and tenderness. The pain is normally located on the left side of the abdomen, but may occur in other areas. °· Fever and chills. °· Bloating. °· Cramping. °· Nausea. °· Vomiting. °· Constipation. °· Diarrhea. °· Blood in your stool. °DIAGNOSIS  °Your health care provider will ask you about your medical history and do a physical exam. You may need to have tests done because many medical conditions can cause the same symptoms as diverticulitis. Tests may include: °· Blood tests. °· Urine tests. °· Imaging tests of the abdomen, including X-rays and CT scans. °When your condition is under control, your health care provider may recommend that you have a colonoscopy. A colonoscopy can show how severe your diverticula are and whether something else is causing your symptoms. °TREATMENT  °Most cases of diverticulitis are mild and can be treated at home. Treatment may include: °· Taking over-the-counter pain medicines. °· Following a clear liquid diet. °· Taking antibiotic medicines by mouth for 7-10 days. °More severe cases may  be treated at a hospital. Treatment may include: °· Not eating or drinking. °· Taking prescription pain medicine. °· Receiving antibiotic medicines through an IV tube. °· Receiving fluids and nutrition through an IV tube. °· Surgery. °HOME CARE INSTRUCTIONS  °· Follow your health care provider's instructions carefully. °· Follow a full liquid diet or other diet as directed by your health care provider. After your symptoms improve, your health care provider may tell you to change your diet. He or she may recommend you eat a high-fiber diet. Fruits and vegetables are good sources of fiber. Fiber makes it easier to pass stool. °· Take fiber supplements or probiotics as directed by your health care provider. °· Only take medicines as directed by your health care provider. °· Keep all your follow-up appointments. °SEEK MEDICAL CARE IF:  °· Your pain does not improve. °· You have a hard time eating food. °· Your bowel movements do not return to normal. °SEEK IMMEDIATE MEDICAL CARE IF:  °· Your pain becomes worse. °· Your symptoms do not get better. °· Your symptoms suddenly get worse. °· You have a fever. °· You have repeated vomiting. °· You have bloody or black, tarry stools. °MAKE SURE YOU:  °· Understand these instructions. °· Will watch your condition. °· Will get help right away if you are not doing well or get worse. °Document Released: 12/08/2004 Document Revised: 03/05/2013 Document Reviewed: 01/23/2013 °[employer]® Patient Information ©2015 [employer]. This information is not intended to replace advice given to you by your health care provider. Make sure you discuss any questions you have with your health care provider. ° °Diverticulitis °Diverticulitis is when small pockets that have formed in your colon (large   intestine) become infected or swollen. °HOME CARE °· Follow your doctor's instructions. °· Follow a special diet if told by your doctor. °· When you feel better, your doctor may tell you to change your  diet. You may be told to eat a lot of fiber. Fruits and vegetables are good sources of fiber. Fiber makes it easier to poop (have bowel movements). °· Take supplements or probiotics as told by your doctor. °· Only take medicines as told by your doctor. °· Keep all follow-up visits with your doctor. °GET HELP IF: °· Your pain does not get better. °· You have a hard time eating food. °· You are not pooping like normal. °GET HELP RIGHT AWAY IF: °· Your pain gets worse. °· Your problems do not get better. °· Your problems suddenly get worse. °· You have a fever. °· You keep throwing up (vomiting). °· You have bloody or black, tarry poop (stool). °MAKE SURE YOU:  °· Understand these instructions. °· Will watch your condition. °· Will get help right away if you are not doing well or get worse. °Document Released: 08/17/2007 Document Revised: 03/05/2013 Document Reviewed: 01/23/2013 °[employer]® Patient Information ©2015 [employer]. This information is not intended to replace advice given to you by your health care provider. Make sure you discuss any questions you have with your health care provider. ° °

## 2013-12-20 NOTE — Discharge Summary (Signed)
Physician Discharge Summary  Samuel Little FTD:[id number] DOB: 09-19-1932 DOA: 12/17/2013  PCP: Wenda Low, MD  Admit date: 12/17/2013 Discharge date: 12/20/2013  Time spent: 35 minutes  Recommendations for Outpatient Follow-up:  1. Discharge home with PCP follow up in 1 week  Discharge Diagnoses:  Principal Problem:   Diverticulitis of cecum  Active Problems:   Sepsis   Bacteremia due to Escherichia coli   Hypothyroidism   Essential hypertension   GERD   Gout   CKD (chronic kidney disease), stage III    BPH   Discharge Condition: fair  Diet recommendation: 2 gm sodium with high fiber content  Filed Weights   12/18/13 0511 12/19/13 0611 12/20/13 0552  Weight: 95.709 kg (211 lb) 96.5 kg (212 lb 11.9 oz) 94.484 kg (208 lb 4.8 oz)    History of present illness:  Please refer to admission H&P for detials, but in brief, 80 y/o hypertension, hypothyroidism, AVNRT status post ablation, gout, using multiple herbal medication, who presented with fever, chills, nausea, vomiting of 1 day duration. Patient was found to have cecal diverticulitis on CT abdomen/pelvis. He had temperature 101.1 and tachycardia. He was admitted to inpatient for further evaluation and treatment. Blood cx sent on admission grew e coli.   Hospital Course:  Sepsis due to Acute cecal diverticulitis  Symptomatically improved. On empiric cipro and flagyl. D/c fluids. Supportive care with antiemetics and pain medications. No further abdominal pain, nausea or vomiting. Advanced to regular diet and tolerating well.  blood cx sent on admission growing e coli sensitive to cephalosporins and bactrim. Intermediate sensitivity to ciprofloxacin. Will discharge him on oral bactrim and flagyl for 12 more days to complete a 2 week course of abx. Follow up with PCP in 1 week  ecoli bacteremia  Possible source from  the colon. Sensitivity as outlined above. UA negative.   Hypotension  On admission and held BP meds. Now  stable and resumed   BPH  Reports urgency, urge incontinence and nocturia symptoms. Enlarged prostate seen on CT. Added flomax   Hx of AVNRT  Stable on tele. Follows with Dr Marlou Porch   GERD  PPI   Hypothyroidism  continue synthroid   Non obstructing b/l renal calculi  Asymptomatic. Follow as outpt   Thrombocytopenia  Mild. possibly due to infection. Follow as outpt  Constipation  added prn sennakot   Code Status: full  Family Communication: spoke with wife on 10/8 Disposition Plan: home    Consultants:  None   Procedures:  CT abdomen   Antibiotics:  IV cipro and flagyl 10/7--   Discharge Exam: Filed Vitals:   12/20/13 0947  BP: 154/68  Pulse: 56  Temp: 98.1 F (36.7 C)  Resp: 18   General: NAD  HEENT: moist oral mucosa  Cardiovascular: N S1 S2, no murmurs  Respiratory: clear b/l  Abdomen: soft, ND, BS+, non tender Musculoskeletal: warm, no edema     Discharge Instructions You were cared for by a hospitalist during your hospital stay. If you have any questions about your discharge medications or the care you received while you were in the hospital after you are discharged, you can call the unit and asked to speak with the hospitalist on call if the hospitalist that took care of you is not available. Once you are discharged, your primary care physician will handle any further medical issues. Please note that NO REFILLS for any discharge medications will be authorized once you are discharged, as it is imperative that you return to  your primary care physician (or establish a relationship with a primary care physician if you do not have one) for your aftercare needs so that they can reassess your need for medications and monitor your lab values.   Current Discharge Medication List    START taking these medications   Details  metroNIDAZOLE (FLAGYL) 500 MG tablet Take 1 tablet (500 mg total) by mouth 3 (three) times daily. Qty: 36 tablet, Refills: 0 until  12/31/2013    senna (SENOKOT) 8.6 MG TABS tablet Take 1 tablet (8.6 mg total) by mouth at bedtime as needed for mild constipation. Qty: 10 each, Refills: 0    sulfamethoxazole-trimethoprim (BACTRIM DS) 800-160 MG per tablet Take 1 tablet by mouth 2 (two) times daily. Qty: 24 tablet, Refills: 0  Until 12/31/2013    tamsulosin (FLOMAX) 0.4 MG CAPS capsule Take 1 capsule (0.4 mg total) by mouth daily after supper. Qty: 30 capsule, Refills: 0      CONTINUE these medications which have NOT CHANGED   Details  Acetaminophen (TYLENOL ARTHRITIS PAIN PO) Take 1 tablet by mouth daily as needed (pain).     Ascorbic Acid (VITAMIN C) 500 MG CAPS Take 500 mg by mouth daily.     aspirin EC 81 MG tablet Take 81 mg by mouth daily.    Bilberry, Vaccinium myrtillus, (BILBERRY PO) Take 1 tablet by mouth daily.     Calcium-Vitamin D (CALTRATE 600 PLUS-VIT D PO) Take 1 tablet by mouth daily.     Cholecalciferol (VITAMIN D) 2000 UNITS tablet Take 2,000 Units by mouth daily.    Cobalamine Combinations (VITAMIN B12-FOLIC ACID PO) Take 1 tablet by mouth daily.     Coenzyme Q10 (CO Q 10 PO) Take 1 tablet by mouth daily.     colchicine 0.6 MG tablet Take 0.6 mg by mouth daily as needed (gout).     CORAL CALCIUM PO Take 2 tablets by mouth daily.    CRANBERRY PO Take 1 tablet by mouth daily.     doxazosin (CARDURA) 4 MG tablet Take 4 mg by mouth daily.    GINKGO BILOBA PO Take 1 tablet by mouth daily.    Glucos-Chondroit-Hyaluron-MSM (GLUCOSAMINE CHONDROITIN JOINT PO) Take 1 tablet by mouth daily.     KRILL OIL ULTRA STRENGTH PO Take 1 tablet by mouth daily.     levothyroxine (SYNTHROID, LEVOTHROID) 175 MCG tablet Take 175 mcg by mouth daily before breakfast.    lisinopril-hydrochlorothiazide (PRINZIDE,ZESTORETIC) 20-12.5 MG per tablet Take 1 tablet by mouth daily.    Misc Natural Products (BLACK CHERRY CONCENTRATE PO) Take 1 tablet by mouth daily.     multivitamin-lutein (OCUVITE-LUTEIN) CAPS  capsule Take 2 capsules by mouth 2 (two) times daily.     omeprazole (PRILOSEC) 20 MG capsule Take 20 mg by mouth daily.    probenecid (BENEMID) 500 MG tablet Take 500 mg by mouth daily.    RESVERATROL PO Take 1 tablet by mouth daily.     traMADol (ULTRAM) 50 MG tablet Take 50 mg by mouth every 6 (six) hours as needed for moderate pain.     TURMERIC PO Take 1 tablet by mouth daily.        Allergies  Allergen Reactions  . Celecoxib Nausea And Vomiting  . Meperidine Hcl     REACTION: Nausea  . Nsaids Other (See Comments)    jaundice   Follow-up Information   Follow up with HUSAIN,KARRAR, MD. Schedule an appointment as soon as possible for a visit in 1 week.  Specialty:  Internal Medicine   Contact information:   [address], [address] [place] [address] [phone]        The results of significant diagnostics from this hospitalization (including imaging, microbiology, ancillary and laboratory) are listed below for reference.    Significant Diagnostic Studies: Dg Chest 2 View  12/18/2013   CLINICAL DATA:  Acute onset of fever, vomiting and chest pain tonight. Initial encounter.  EXAM: CHEST  2 VIEW  COMPARISON:  Chest radiograph performed 11/04/2009  FINDINGS: The lungs are well-aerated and clear. There is no evidence of focal opacification, pleural effusion or pneumothorax.  The heart is normal in size; the mediastinal contour is within normal limits. No acute osseous abnormalities are seen. A mild chronic compression deformity is suggested at the lower thoracic spine.  IMPRESSION: No acute cardiopulmonary process seen.   Electronically Signed   By: Garald Balding M.D.   On: 12/18/2013 01:20   Ct Abdomen Pelvis W Contrast  12/18/2013   CLINICAL DATA:  Acute onset of right-sided abdominal pain. Nausea and vomiting. Initial encounter.  EXAM: CT ABDOMEN AND PELVIS WITH CONTRAST  TECHNIQUE: Multidetector CT imaging of the abdomen and pelvis was performed using the  standard protocol following bolus administration of intravenous contrast.  CONTRAST:  167mL OMNIPAQUE IOHEXOL 300 MG/ML  SOLN  COMPARISON:  None.  FINDINGS: The visualized lung bases are clear.  The liver and spleen are unremarkable in appearance. The gallbladder is within normal limits. The pancreas and adrenal glands are unremarkable.  Scattered bilateral nonobstructing renal stones are seen, measuring up to 7 mm in size. Nonspecific perinephric stranding is noted bilaterally. A 0.9 cm cm cyst is seen at the posterior aspect of the right kidney. There is no evidence of hydronephrosis. No obstructing ureteral stones are seen.  No free fluid is identified. The small bowel is unremarkable in appearance. The stomach is within normal limits. No acute vascular abnormalities are seen.  The appendix is normal in caliber and contains trace air, without evidence for appendicitis.  Mild focal soft tissue inflammation is noted about the cecum, with mild cecal wall thickening, and associated inflamed diverticula, compatible with acute diverticulitis. Scattered diverticulosis is noted along the entirety of the colon, most prominent about the cecum and descending colon. There is no evidence of perforation or abscess formation at this time.  The bladder is mildly distended and grossly unremarkable. The prostate is enlarged, measuring 6.4 cm in transverse dimension. No inguinal lymphadenopathy is seen.  Small bilateral inguinal hernias are seen, containing only fat.  No acute osseous abnormalities are identified. Multilevel disc space narrowing and vacuum phenomenon is noted along the lower thoracic and lumbar spine, with associated degenerative change.  IMPRESSION: 1. Mild focal acute diverticulitis at the cecum, with mild associated cecal wall thickening. Mild soft tissue inflammation noted, without evidence of perforation or abscess formation at this time. 2. Scattered diverticulosis noted along the entirety of the colon, most  prominent about the cecum and descending colon. 3. Scattered nonobstructing bilateral renal stones, measuring up to 7 mm in size. 4. Tiny right renal cyst seen. 5. Enlarged prostate noted. 6. Small bilateral inguinal hernias, containing only fat. 7. Mild diffuse degenerative change noted along the lower thoracic and lumbar spine.   Electronically Signed   By: Garald Balding M.D.   On: 12/18/2013 02:01    Microbiology: Recent Results (from the past 240 hour(s))  CULTURE, BLOOD (ROUTINE X 2)     Status: None   Collection  Time    12/17/13 11:55 PM      Result Value Ref Range Status   Specimen Description BLOOD RIGHT ARM   Final   Special Requests BOTTLES DRAWN AEROBIC AND ANAEROBIC 10CC   Final   Culture  Setup Time     Final   Value: 12/18/2013 10:11     Performed at [facility]   Culture     Final   Value: ESCHERICHIA COLI     Note: Gram Stain Report Called to,Read Back By and Verified With: GLADYS RAFUL ON 12/18/2013 AT 9:13P BY WILEJ     Performed at [facility]   Report Status 12/20/2013 FINAL   Final   Organism ID, Bacteria ESCHERICHIA COLI   Final  CULTURE, BLOOD (ROUTINE X 2)     Status: None   Collection Time    12/18/13 12:02 AM      Result Value Ref Range Status   Specimen Description BLOOD RIGHT HAND   Final   Special Requests BOTTLES DRAWN AEROBIC ONLY 4CC   Final   Culture  Setup Time     Final   Value: 12/18/2013 10:30     Performed at [facility]   Culture     Final   Value: ESCHERICHIA COLI     Note: SUSCEPTIBILITIES PERFORMED ON PREVIOUS CULTURE WITHIN THE LAST 5 DAYS.     Note: Gram Stain Report Called to,Read Back By and Verified With: GLADYS RAFUL ON 12/18/2013 AT 9:13P BY WILEJ     Performed at [facility]   Report Status 12/20/2013 FINAL   Final  URINE CULTURE     Status: None   Collection Time    12/18/13 12:36 AM      Result Value Ref Range Status   Specimen Description URINE, CATHETERIZED   Final   Special Requests NONE    Final   Culture  Setup Time     Final   Value: 12/18/2013 10:39     Performed at [facility] PENDING   Incomplete   Culture     Final   Value: Culture reincubated for better growth     Performed at [facility]   Report Status PENDING   Incomplete     Labs: Basic Metabolic Panel:  Recent Labs Lab 12/17/13 2325 12/18/13 0720 12/19/13 0500  NA 142 139 137  K 3.8 3.5* 4.3  CL 102 104 105  CO2 24 22 19   GLUCOSE 101* 119* 76  BUN 20 20 21   CREATININE 1.35 1.32 1.28  CALCIUM 9.4 8.6 8.4   Liver Function Tests:  Recent Labs Lab 12/17/13 2325  AST 37  ALT 27  ALKPHOS 78  BILITOT 1.3*  PROT 6.7  ALBUMIN 3.5    Recent Labs Lab 12/18/13 0720  LIPASE 25   No results found for this basename: AMMONIA,  in the last 168 hours CBC:  Recent Labs Lab 12/17/13 2325 12/18/13 0720 12/19/13 0500 12/20/13 0527  WBC 4.1 14.2* 12.4* 7.9  NEUTROABS 3.7  --   --   --   HGB 14.2 12.8* 12.3* 12.8*  HCT 40.8 36.5* 36.1* 37.1*  MCV 94.2 93.8 95.0 96.1  PLT 135* 105* 102* 96*   Cardiac Enzymes: No results found for this basename: CKTOTAL, CKMB, CKMBINDEX, TROPONINI,  in the last 168 hours BNP: BNP (last 3 results) No results found for this basename: PROBNP,  in the last 8760 hours CBG: No results found  for this basename: GLUCAP,  in the last 168 hours     Signed:  Louellen Molder  [facility] 12/20/2013, 11:27 AM

## 2013-12-21 LAB — URINE CULTURE: Colony Count: 4000

## 2013-12-30 DIAGNOSIS — K5792 Diverticulitis of intestine, part unspecified, without perforation or abscess without bleeding: Secondary | ICD-10-CM | POA: Diagnosis not present

## 2013-12-30 DIAGNOSIS — N400 Enlarged prostate without lower urinary tract symptoms: Secondary | ICD-10-CM | POA: Diagnosis not present

## 2013-12-30 DIAGNOSIS — R7309 Other abnormal glucose: Secondary | ICD-10-CM | POA: Diagnosis not present

## 2013-12-30 DIAGNOSIS — N182 Chronic kidney disease, stage 2 (mild): Secondary | ICD-10-CM | POA: Diagnosis not present

## 2014-01-15 DIAGNOSIS — D225 Melanocytic nevi of trunk: Secondary | ICD-10-CM | POA: Diagnosis not present

## 2014-01-15 DIAGNOSIS — L570 Actinic keratosis: Secondary | ICD-10-CM | POA: Diagnosis not present

## 2014-01-15 DIAGNOSIS — I8310 Varicose veins of unspecified lower extremity with inflammation: Secondary | ICD-10-CM | POA: Diagnosis not present

## 2014-01-30 DIAGNOSIS — H3531 Nonexudative age-related macular degeneration: Secondary | ICD-10-CM | POA: Diagnosis not present

## 2014-01-30 DIAGNOSIS — H04123 Dry eye syndrome of bilateral lacrimal glands: Secondary | ICD-10-CM | POA: Diagnosis not present

## 2014-02-12 DIAGNOSIS — H903 Sensorineural hearing loss, bilateral: Secondary | ICD-10-CM | POA: Diagnosis not present

## 2014-02-19 DIAGNOSIS — Z974 Presence of external hearing-aid: Secondary | ICD-10-CM | POA: Diagnosis not present

## 2014-02-19 DIAGNOSIS — H903 Sensorineural hearing loss, bilateral: Secondary | ICD-10-CM | POA: Diagnosis not present

## 2014-02-19 DIAGNOSIS — H6123 Impacted cerumen, bilateral: Secondary | ICD-10-CM | POA: Diagnosis not present

## 2014-02-21 DIAGNOSIS — I87393 Chronic venous hypertension (idiopathic) with other complications of bilateral lower extremity: Secondary | ICD-10-CM | POA: Diagnosis not present

## 2014-02-21 DIAGNOSIS — I83893 Varicose veins of bilateral lower extremities with other complications: Secondary | ICD-10-CM | POA: Diagnosis not present

## 2014-03-03 DIAGNOSIS — N183 Chronic kidney disease, stage 3 (moderate): Secondary | ICD-10-CM | POA: Diagnosis not present

## 2014-03-20 DIAGNOSIS — Z23 Encounter for immunization: Secondary | ICD-10-CM | POA: Diagnosis not present

## 2014-03-20 DIAGNOSIS — I10 Essential (primary) hypertension: Secondary | ICD-10-CM | POA: Diagnosis not present

## 2014-03-20 DIAGNOSIS — E039 Hypothyroidism, unspecified: Secondary | ICD-10-CM | POA: Diagnosis not present

## 2014-03-20 DIAGNOSIS — M109 Gout, unspecified: Secondary | ICD-10-CM | POA: Diagnosis not present

## 2014-03-20 DIAGNOSIS — K219 Gastro-esophageal reflux disease without esophagitis: Secondary | ICD-10-CM | POA: Diagnosis not present

## 2014-03-20 DIAGNOSIS — K5790 Diverticulosis of intestine, part unspecified, without perforation or abscess without bleeding: Secondary | ICD-10-CM | POA: Diagnosis not present

## 2014-03-20 DIAGNOSIS — R7309 Other abnormal glucose: Secondary | ICD-10-CM | POA: Diagnosis not present

## 2014-03-20 DIAGNOSIS — N400 Enlarged prostate without lower urinary tract symptoms: Secondary | ICD-10-CM | POA: Diagnosis not present

## 2014-03-20 DIAGNOSIS — Z0000 Encounter for general adult medical examination without abnormal findings: Secondary | ICD-10-CM | POA: Diagnosis not present

## 2014-03-20 DIAGNOSIS — Z1389 Encounter for screening for other disorder: Secondary | ICD-10-CM | POA: Diagnosis not present

## 2014-03-20 DIAGNOSIS — N182 Chronic kidney disease, stage 2 (mild): Secondary | ICD-10-CM | POA: Diagnosis not present

## 2014-03-20 DIAGNOSIS — I471 Supraventricular tachycardia: Secondary | ICD-10-CM | POA: Diagnosis not present

## 2014-03-25 ENCOUNTER — Other Ambulatory Visit: Payer: Self-pay | Admitting: *Deleted

## 2014-03-25 ENCOUNTER — Ambulatory Visit (INDEPENDENT_AMBULATORY_CARE_PROVIDER_SITE_OTHER): Payer: Medicare Other | Admitting: Cardiology

## 2014-03-25 ENCOUNTER — Encounter: Payer: Self-pay | Admitting: Cardiology

## 2014-03-25 VITALS — BP 136/78 | HR 85 | Ht 73.0 in | Wt 203.0 lb

## 2014-03-25 DIAGNOSIS — I471 Supraventricular tachycardia: Secondary | ICD-10-CM | POA: Diagnosis not present

## 2014-03-25 DIAGNOSIS — I10 Essential (primary) hypertension: Secondary | ICD-10-CM | POA: Diagnosis not present

## 2014-03-25 NOTE — Patient Instructions (Signed)
The current medical regimen is effective;  continue present plan and medications.  Follow up in 1 year with Dr. Skains.  You will receive a letter in the mail 2 months before you are due.  Please call us when you receive this letter to schedule your follow up appointment.  Thank you for choosing [facility]!!     

## 2014-03-25 NOTE — Progress Notes (Signed)
[place]. [address], [address] [place], [place]  [address] Phone: [phone] Fax:  [phone]  Date:  03/25/2014   ID:  Samuel Little, DOB December 19, 1932, MRN [id number]  PCP:  Wenda Low, MD   History of Present Illness: Samuel Little is a 81 y.o. 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   Had a brief episode of atrial fibrillation that was induced by SVT previously. He has had SVT ablation by Dr. Lovena Le. 3/11. He has also had a pericardial window due to pericardial effusion in August/2011. Felt skipping one day that was consistent. Feels some SOB, palps after skipping PPI. PPI seems to help. An event monitor showed no evidence of atrial fibrillation. He is not on any anticoagulation. He was on Pradaxa for 7 days prescribed by a doctor in [place] after a brief episode but this was stopped by Dr. Leonia Reeves after no further evidence of atrial fibrillation was discovered. He has worn an event monitor in the past as above.   Overall he has been doing well. He was on atenolol 25 mg once a day down from 50. +Fatigue. No chest pain. I decided to try to discontinue his atenolol given his heart rate in the 50s, fatigue, blood pressure.   Had gout attack.     Wt Readings from Last 3 Encounters:  03/25/14 203 lb (92.08 kg)  12/20/13 208 lb 4.8 oz (94.484 kg)  03/19/13 207 lb (93.895 kg)     Past Medical History  Diagnosis Date  . Hypertension   . Thyroid disease     hypothyroidism  . Gout   . Pleural effusion, left   . Gastroesophageal reflux disease   . Arthritis   . 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  Current Outpatient Prescriptions  Medication Sig Dispense Refill  .  Acetaminophen (TYLENOL ARTHRITIS PAIN PO) Take 1 tablet by mouth daily as needed (pain).     . Ascorbic Acid (VITAMIN C) 500 MG CAPS Take 500 mg by mouth daily.     Marland Kitchen aspirin EC 81 MG tablet Take 81 mg by mouth daily.    . Bilberry, Vaccinium myrtillus, (BILBERRY PO) Take 1 tablet by mouth daily.     . Calcium-Vitamin D (CALTRATE 600 PLUS-VIT D PO) Take 1 tablet by mouth daily.     . Cholecalciferol (VITAMIN D) 2000 UNITS tablet Take 2,000 Units by mouth daily.    . Cobalamine Combinations (VITAMIN B12-FOLIC ACID PO) Take 1 tablet by mouth daily.     . Coenzyme Q10 (CO Q 10 PO) Take 1 tablet by mouth daily.     . colchicine 0.6 MG tablet Take 0.6 mg by mouth daily as needed (gout).     . CORAL CALCIUM PO Take 2 tablets by mouth daily.    Marland Kitchen CRANBERRY PO Take 1 tablet by mouth daily.     Marland Kitchen doxazosin (CARDURA) 4 MG tablet Take 4 mg by mouth daily.    Marland Kitchen GINKGO BILOBA PO Take 1 tablet by mouth daily.    . Glucos-Chondroit-Hyaluron-MSM (GLUCOSAMINE CHONDROITIN JOINT PO) Take 1 tablet by mouth daily.     Marland Kitchen KRILL OIL ULTRA STRENGTH PO Take 1 tablet by mouth daily.     Marland Kitchen  levothyroxine (SYNTHROID, LEVOTHROID) 175 MCG tablet Take 175 mcg by mouth daily before breakfast.    . lisinopril-hydrochlorothiazide (PRINZIDE,ZESTORETIC) 20-12.5 MG per tablet Take 1 tablet by mouth daily.    . Misc Natural Products (BLACK CHERRY CONCENTRATE PO) Take 1 tablet by mouth daily.     . multivitamin-lutein (OCUVITE-LUTEIN) CAPS capsule Take 2 capsules by mouth 2 (two) times daily.     Marland Kitchen omeprazole (PRILOSEC) 20 MG capsule Take 20 mg by mouth daily.    . probenecid (BENEMID) 500 MG tablet Take 500 mg by mouth daily.    Marland Kitchen RESVERATROL PO Take 1 tablet by mouth daily.     Marland Kitchen senna (SENOKOT) 8.6 MG TABS tablet Take 1 tablet (8.6 mg total) by mouth at bedtime as needed for mild constipation. 10 each 0  . tamsulosin (FLOMAX) 0.4 MG CAPS capsule Take 1 capsule (0.4 mg total) by mouth daily after supper. 30 capsule 0  . traMADol  (ULTRAM) 50 MG tablet Take 50 mg by mouth every 6 (six) hours as needed for moderate pain.     . TURMERIC PO Take 1 tablet by mouth daily.      No current facility-administered medications for this visit.    Allergies:    Allergies  Allergen Reactions  . Celecoxib Nausea And Vomiting  . Meperidine Hcl     REACTION: Nausea  . Nsaids Other (See Comments)    jaundice    Social History:  The patient  reports that he has never smoked. He does not have any smokeless tobacco history on file. He reports that he does not drink alcohol or use illicit drugs.   ROS:  Please see the history of present illness.   Recent gout. No chest pain, no syncope, no palpitations  PHYSICAL EXAM: VS:  BP 136/78 mmHg  Pulse 85  Ht 6\' 1"  (1.854 m)  Wt 203 lb (92.08 kg)  BMI 26.79 kg/m2 Well nourished, well developed, in no acute distress HEENT: normal Neck: no JVD Cardiac:  Mildly bradycardic at times normal S1, S2; RRR; no murmur Lungs:  clear to auscultation bilaterally, no wheezing, rhonchi or rales Abd: soft, nontender, no hepatomegaly Ext: no edema Skin: warm and dry Neuro: no focal abnormalities noted  EKG:  Sinus rhythm rate 68 with right bundle branch block and no other abnormalities     ASSESSMENT AND PLAN:  1. Chronic right bundle branch block-stable, no symptoms. Stopped his low-dose atenolol at prior visit. Doing well. If he starts to notice his blood pressure increasing, he is to resume, let me know. 2. SVT-status post ablation. Holding very well. See above for possible atrial fibrillation. No further issues. If palps - ok to take atenolol PRN 3. Hypertension-blood pressure very well controlled. If it increases after stopping atenolol, please let me know. Showed me several BP's ok. 4. Prior pericardial effusion-resolved. 5. 1 year follow up.  Signed, Candee Furbish, MD [facility]  03/25/2014 3:47 PM

## 2014-04-02 ENCOUNTER — Encounter: Payer: Self-pay | Admitting: Cardiology

## 2014-06-04 DIAGNOSIS — D225 Melanocytic nevi of trunk: Secondary | ICD-10-CM | POA: Diagnosis not present

## 2014-06-04 DIAGNOSIS — D239 Other benign neoplasm of skin, unspecified: Secondary | ICD-10-CM | POA: Diagnosis not present

## 2014-06-04 DIAGNOSIS — D2339 Other benign neoplasm of skin of other parts of face: Secondary | ICD-10-CM | POA: Diagnosis not present

## 2014-06-20 DIAGNOSIS — M109 Gout, unspecified: Secondary | ICD-10-CM | POA: Diagnosis not present

## 2014-07-29 DIAGNOSIS — M25461 Effusion, right knee: Secondary | ICD-10-CM | POA: Diagnosis not present

## 2014-07-29 DIAGNOSIS — M170 Bilateral primary osteoarthritis of knee: Secondary | ICD-10-CM | POA: Diagnosis not present

## 2014-08-12 DIAGNOSIS — M6281 Muscle weakness (generalized): Secondary | ICD-10-CM | POA: Diagnosis not present

## 2014-08-12 DIAGNOSIS — M150 Primary generalized (osteo)arthritis: Secondary | ICD-10-CM | POA: Diagnosis not present

## 2014-08-12 DIAGNOSIS — M545 Low back pain: Secondary | ICD-10-CM | POA: Diagnosis not present

## 2014-08-12 DIAGNOSIS — M25561 Pain in right knee: Secondary | ICD-10-CM | POA: Diagnosis not present

## 2014-08-18 DIAGNOSIS — M545 Low back pain: Secondary | ICD-10-CM | POA: Diagnosis not present

## 2014-08-18 DIAGNOSIS — M150 Primary generalized (osteo)arthritis: Secondary | ICD-10-CM | POA: Diagnosis not present

## 2014-08-18 DIAGNOSIS — M6281 Muscle weakness (generalized): Secondary | ICD-10-CM | POA: Diagnosis not present

## 2014-08-18 DIAGNOSIS — M25561 Pain in right knee: Secondary | ICD-10-CM | POA: Diagnosis not present

## 2014-08-20 DIAGNOSIS — M6281 Muscle weakness (generalized): Secondary | ICD-10-CM | POA: Diagnosis not present

## 2014-08-20 DIAGNOSIS — M545 Low back pain: Secondary | ICD-10-CM | POA: Diagnosis not present

## 2014-08-20 DIAGNOSIS — M25561 Pain in right knee: Secondary | ICD-10-CM | POA: Diagnosis not present

## 2014-08-20 DIAGNOSIS — M150 Primary generalized (osteo)arthritis: Secondary | ICD-10-CM | POA: Diagnosis not present

## 2014-08-21 DIAGNOSIS — H3532 Exudative age-related macular degeneration: Secondary | ICD-10-CM | POA: Diagnosis not present

## 2014-08-21 DIAGNOSIS — H524 Presbyopia: Secondary | ICD-10-CM | POA: Diagnosis not present

## 2014-08-25 DIAGNOSIS — M25561 Pain in right knee: Secondary | ICD-10-CM | POA: Diagnosis not present

## 2014-08-25 DIAGNOSIS — M545 Low back pain: Secondary | ICD-10-CM | POA: Diagnosis not present

## 2014-08-25 DIAGNOSIS — M6281 Muscle weakness (generalized): Secondary | ICD-10-CM | POA: Diagnosis not present

## 2014-08-25 DIAGNOSIS — M150 Primary generalized (osteo)arthritis: Secondary | ICD-10-CM | POA: Diagnosis not present

## 2014-08-28 DIAGNOSIS — M25561 Pain in right knee: Secondary | ICD-10-CM | POA: Diagnosis not present

## 2014-08-28 DIAGNOSIS — M545 Low back pain: Secondary | ICD-10-CM | POA: Diagnosis not present

## 2014-08-28 DIAGNOSIS — M6281 Muscle weakness (generalized): Secondary | ICD-10-CM | POA: Diagnosis not present

## 2014-09-01 DIAGNOSIS — M150 Primary generalized (osteo)arthritis: Secondary | ICD-10-CM | POA: Diagnosis not present

## 2014-09-01 DIAGNOSIS — M25561 Pain in right knee: Secondary | ICD-10-CM | POA: Diagnosis not present

## 2014-09-01 DIAGNOSIS — M6281 Muscle weakness (generalized): Secondary | ICD-10-CM | POA: Diagnosis not present

## 2014-09-01 DIAGNOSIS — M545 Low back pain: Secondary | ICD-10-CM | POA: Diagnosis not present

## 2014-09-03 DIAGNOSIS — M6281 Muscle weakness (generalized): Secondary | ICD-10-CM | POA: Diagnosis not present

## 2014-09-03 DIAGNOSIS — M25561 Pain in right knee: Secondary | ICD-10-CM | POA: Diagnosis not present

## 2014-09-03 DIAGNOSIS — M545 Low back pain: Secondary | ICD-10-CM | POA: Diagnosis not present

## 2014-09-03 DIAGNOSIS — M150 Primary generalized (osteo)arthritis: Secondary | ICD-10-CM | POA: Diagnosis not present

## 2014-09-10 DIAGNOSIS — M545 Low back pain: Secondary | ICD-10-CM | POA: Diagnosis not present

## 2014-09-10 DIAGNOSIS — M25561 Pain in right knee: Secondary | ICD-10-CM | POA: Diagnosis not present

## 2014-09-10 DIAGNOSIS — M150 Primary generalized (osteo)arthritis: Secondary | ICD-10-CM | POA: Diagnosis not present

## 2014-09-10 DIAGNOSIS — M6281 Muscle weakness (generalized): Secondary | ICD-10-CM | POA: Diagnosis not present

## 2014-09-29 DIAGNOSIS — M170 Bilateral primary osteoarthritis of knee: Secondary | ICD-10-CM | POA: Diagnosis not present

## 2014-10-07 DIAGNOSIS — H903 Sensorineural hearing loss, bilateral: Secondary | ICD-10-CM | POA: Diagnosis not present

## 2014-10-20 DIAGNOSIS — I471 Supraventricular tachycardia: Secondary | ICD-10-CM | POA: Diagnosis not present

## 2014-10-20 DIAGNOSIS — D696 Thrombocytopenia, unspecified: Secondary | ICD-10-CM | POA: Diagnosis not present

## 2014-10-20 DIAGNOSIS — M1990 Unspecified osteoarthritis, unspecified site: Secondary | ICD-10-CM | POA: Diagnosis not present

## 2014-10-20 DIAGNOSIS — K219 Gastro-esophageal reflux disease without esophagitis: Secondary | ICD-10-CM | POA: Diagnosis not present

## 2014-10-20 DIAGNOSIS — M109 Gout, unspecified: Secondary | ICD-10-CM | POA: Diagnosis not present

## 2014-10-20 DIAGNOSIS — E039 Hypothyroidism, unspecified: Secondary | ICD-10-CM | POA: Diagnosis not present

## 2014-10-20 DIAGNOSIS — I10 Essential (primary) hypertension: Secondary | ICD-10-CM | POA: Diagnosis not present

## 2015-01-07 DIAGNOSIS — Z23 Encounter for immunization: Secondary | ICD-10-CM | POA: Diagnosis not present

## 2015-03-30 DIAGNOSIS — I8312 Varicose veins of left lower extremity with inflammation: Secondary | ICD-10-CM | POA: Diagnosis not present

## 2015-03-30 DIAGNOSIS — I8311 Varicose veins of right lower extremity with inflammation: Secondary | ICD-10-CM | POA: Diagnosis not present

## 2015-04-23 DIAGNOSIS — I471 Supraventricular tachycardia: Secondary | ICD-10-CM | POA: Diagnosis not present

## 2015-04-23 DIAGNOSIS — Z0000 Encounter for general adult medical examination without abnormal findings: Secondary | ICD-10-CM | POA: Diagnosis not present

## 2015-04-23 DIAGNOSIS — E782 Mixed hyperlipidemia: Secondary | ICD-10-CM | POA: Diagnosis not present

## 2015-04-23 DIAGNOSIS — N182 Chronic kidney disease, stage 2 (mild): Secondary | ICD-10-CM | POA: Diagnosis not present

## 2015-04-23 DIAGNOSIS — N400 Enlarged prostate without lower urinary tract symptoms: Secondary | ICD-10-CM | POA: Diagnosis not present

## 2015-04-23 DIAGNOSIS — I10 Essential (primary) hypertension: Secondary | ICD-10-CM | POA: Diagnosis not present

## 2015-04-23 DIAGNOSIS — M109 Gout, unspecified: Secondary | ICD-10-CM | POA: Diagnosis not present

## 2015-04-23 DIAGNOSIS — E039 Hypothyroidism, unspecified: Secondary | ICD-10-CM | POA: Diagnosis not present

## 2015-04-23 DIAGNOSIS — M1990 Unspecified osteoarthritis, unspecified site: Secondary | ICD-10-CM | POA: Diagnosis not present

## 2015-04-23 DIAGNOSIS — Z1389 Encounter for screening for other disorder: Secondary | ICD-10-CM | POA: Diagnosis not present

## 2015-04-23 DIAGNOSIS — K219 Gastro-esophageal reflux disease without esophagitis: Secondary | ICD-10-CM | POA: Diagnosis not present

## 2015-04-23 DIAGNOSIS — R7309 Other abnormal glucose: Secondary | ICD-10-CM | POA: Diagnosis not present

## 2015-05-21 ENCOUNTER — Ambulatory Visit (INDEPENDENT_AMBULATORY_CARE_PROVIDER_SITE_OTHER): Payer: Medicare Other | Admitting: Cardiology

## 2015-05-21 ENCOUNTER — Encounter: Payer: Self-pay | Admitting: Cardiology

## 2015-05-21 VITALS — BP 134/76 | HR 68 | Ht 72.0 in | Wt 208.0 lb

## 2015-05-21 DIAGNOSIS — I471 Supraventricular tachycardia: Secondary | ICD-10-CM | POA: Diagnosis not present

## 2015-05-21 DIAGNOSIS — I10 Essential (primary) hypertension: Secondary | ICD-10-CM | POA: Diagnosis not present

## 2015-05-21 NOTE — Progress Notes (Signed)
[facility]. [address], [address], [place]  [address] Phone: [phone] Fax:  [phone]  Date:  05/21/2015   ID:  Samuel Little, DOB March 14, 1933, MRN [id number]  PCP:  Wenda Low, MD   History of Present Illness: Samuel Little is a 82 y.o. 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   Had a brief episode of atrial fibrillation that was induced by SVT previously. He has had SVT ablation by Dr. Lovena Le. 3/11. He has also had a pericardial window due to pericardial effusion in August/2011. Felt skipping one day that was consistent. Feels some SOB, palps after skipping PPI. PPI seems to help. An event monitor showed no evidence of atrial fibrillation. He is not on any anticoagulation. He was on Pradaxa for 7 days prescribed by a doctor in [place] after a brief episode but this was stopped by Dr. Leonia Reeves after no further evidence of atrial fibrillation was discovered. He has worn an event monitor in the past as above.   Overall he has been doing well.  No chest pain. No longer on atenolol.   Legs knees back with pain. No chest pain, no syncope    Wt Readings from Last 3 Encounters:  05/21/15 208 lb (94.348 kg)  03/25/14 203 lb (92.08 kg)  12/20/13 208 lb 4.8 oz (94.484 kg)     Past Medical History  Diagnosis Date  . Hypertension   . Thyroid disease     hypothyroidism  . Gout   . Pleural effusion, left   . Gastroesophageal reflux disease   . Arthritis   . 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  Current Outpatient Prescriptions  Medication Sig Dispense Refill  . Acetaminophen (TYLENOL ARTHRITIS PAIN PO) Take 650 mg by mouth daily as needed (pain).     . Ascorbic Acid  (VITAMIN C) 500 MG CAPS Take 500 mg by mouth daily.     Marland Kitchen aspirin EC 81 MG tablet Take 81 mg by mouth daily.    . Bilberry, Vaccinium myrtillus, (BILBERRY PO) Take 1 tablet by mouth daily.     . Calcium-Vitamin D (CALTRATE 600 PLUS-VIT D PO) Take 1 tablet by mouth daily.     . Cholecalciferol (VITAMIN D) 2000 UNITS tablet Take 2,000 Units by mouth daily.    . Cobalamine Combinations (VITAMIN B12-FOLIC ACID PO) Take 1 tablet by mouth daily.     . Coenzyme Q10 (CO Q 10 PO) Take 1 tablet by mouth daily.     . colchicine 0.6 MG tablet Take 0.6 mg by mouth daily as needed (gout).     . CORAL CALCIUM PO Take 2 tablets by mouth daily.    Marland Kitchen CRANBERRY PO Take 1 tablet by mouth daily.     Marland Kitchen doxazosin (CARDURA) 4 MG tablet Take 4 mg by mouth daily.    Marland Kitchen GINKGO BILOBA PO Take 1 tablet by mouth daily.    . Glucos-Chondroit-Hyaluron-MSM (GLUCOSAMINE CHONDROITIN JOINT PO) Take 1 tablet by mouth daily.     Marland Kitchen KRILL OIL ULTRA STRENGTH PO Take 1 tablet by mouth daily.     Marland Kitchen levothyroxine (SYNTHROID, LEVOTHROID) 175 MCG tablet Take 175 mcg by mouth daily before breakfast.    . lisinopril-hydrochlorothiazide (PRINZIDE,ZESTORETIC)  20-12.5 MG per tablet Take 1 tablet by mouth daily.    . Misc Natural Products (BLACK CHERRY CONCENTRATE PO) Take 1 tablet by mouth daily.     . multivitamin-lutein (OCUVITE-LUTEIN) CAPS capsule Take 2 capsules by mouth 2 (two) times daily.     Marland Kitchen omeprazole (PRILOSEC) 20 MG capsule Take 20 mg by mouth daily.    . probenecid (BENEMID) 500 MG tablet Take 500 mg by mouth daily.    Marland Kitchen RESVERATROL PO Take 1 tablet by mouth daily.     Marland Kitchen senna (SENOKOT) 8.6 MG TABS tablet Take 1 tablet (8.6 mg total) by mouth at bedtime as needed for mild constipation. 10 each 0  . tamsulosin (FLOMAX) 0.4 MG CAPS capsule Take 1 capsule (0.4 mg total) by mouth daily after supper. 30 capsule 0  . traMADol (ULTRAM) 50 MG tablet Take 50 mg by mouth every 6 (six) hours as needed for moderate pain.     . TURMERIC PO  Take 1 tablet by mouth daily.      No current facility-administered medications for this visit.    Allergies:    Allergies  Allergen Reactions  . Celecoxib Nausea And Vomiting  . Meperidine Hcl     REACTION: Nausea  . Nsaids Other (See Comments)    jaundice    Social History:  The patient  reports that he has never smoked. He does not have any smokeless tobacco history on file. He reports that he does not drink alcohol or use illicit drugs.   ROS:  Please see the history of present illness.   Recent gout. No chest pain, no syncope, no palpitations  PHYSICAL EXAM: VS:  BP 134/76 mmHg  Pulse 68  Ht 6' (1.829 m)  Wt 208 lb (94.348 kg)  BMI 28.20 kg/m2 Well nourished, well developed, in no acute distress HEENT: normal Neck: no JVD Cardiac:  Mildly bradycardic at times normal S1, S2; RRR; no murmur Lungs:  clear to auscultation bilaterally, no wheezing, rhonchi or rales Abd: soft, nontender, no hepatomegaly Ext: no edema Skin: warm and dry Neuro: no focal abnormalities noted  EKG:   EKG was ordered today 05/21/15-sinus rhythm with first-degree AV block heart rate 70 right bundle-branch block, inferior infarct pattern personally viewed-prior Sinus rhythm rate 68 with right bundle branch block and no other abnormalities     ASSESSMENT AND PLAN:  1. Chronic right bundle branch block-stable, no symptoms. Stopped his low-dose atenolol at prior visit. Doing well. If he starts to notice his blood pressure increasing, he is to resume, let me know. 2. SVT-status post ablation. Holding very well. See above for possible atrial fibrillation. No further issues. If palps - ok to take atenolol PRN 3. Hypertension-blood pressure very well controlled. Please let me know. Showed me several BP's ok. 4. Prior pericardial effusion-resolved. 5. PRN follow up.  Signed, Candee Furbish, MD [facility]  05/21/2015 3:15 PM

## 2015-05-21 NOTE — Patient Instructions (Signed)
Medication Instructions:  The current medical regimen is effective;  continue present plan and medications.  Follow-Up: Follow up as needed with Dr Skains.  If you need a refill on your cardiac medications before your next appointment, please call your pharmacy.  Thank you for choosing [facility]!!     

## 2015-07-07 DIAGNOSIS — L570 Actinic keratosis: Secondary | ICD-10-CM | POA: Diagnosis not present

## 2015-07-07 DIAGNOSIS — D225 Melanocytic nevi of trunk: Secondary | ICD-10-CM | POA: Diagnosis not present

## 2015-07-07 DIAGNOSIS — D045 Carcinoma in situ of skin of trunk: Secondary | ICD-10-CM | POA: Diagnosis not present

## 2015-07-07 DIAGNOSIS — X32XXXD Exposure to sunlight, subsequent encounter: Secondary | ICD-10-CM | POA: Diagnosis not present

## 2015-07-07 DIAGNOSIS — M795 Residual foreign body in soft tissue: Secondary | ICD-10-CM | POA: Diagnosis not present

## 2015-08-05 DIAGNOSIS — R2231 Localized swelling, mass and lump, right upper limb: Secondary | ICD-10-CM | POA: Diagnosis not present

## 2015-08-05 DIAGNOSIS — M79644 Pain in right finger(s): Secondary | ICD-10-CM | POA: Diagnosis not present

## 2015-08-11 ENCOUNTER — Other Ambulatory Visit: Payer: Self-pay | Admitting: Orthopedic Surgery

## 2015-08-18 DIAGNOSIS — X32XXXD Exposure to sunlight, subsequent encounter: Secondary | ICD-10-CM | POA: Diagnosis not present

## 2015-08-18 DIAGNOSIS — L570 Actinic keratosis: Secondary | ICD-10-CM | POA: Diagnosis not present

## 2015-08-18 DIAGNOSIS — Z08 Encounter for follow-up examination after completed treatment for malignant neoplasm: Secondary | ICD-10-CM | POA: Diagnosis not present

## 2015-08-18 DIAGNOSIS — C44319 Basal cell carcinoma of skin of other parts of face: Secondary | ICD-10-CM | POA: Diagnosis not present

## 2015-08-18 DIAGNOSIS — Z85828 Personal history of other malignant neoplasm of skin: Secondary | ICD-10-CM | POA: Diagnosis not present

## 2015-08-25 ENCOUNTER — Encounter (HOSPITAL_BASED_OUTPATIENT_CLINIC_OR_DEPARTMENT_OTHER): Payer: Self-pay | Admitting: *Deleted

## 2015-09-01 ENCOUNTER — Ambulatory Visit (HOSPITAL_BASED_OUTPATIENT_CLINIC_OR_DEPARTMENT_OTHER): Admission: RE | Admit: 2015-09-01 | Payer: Medicare Other | Source: Ambulatory Visit | Admitting: Orthopedic Surgery

## 2015-09-01 HISTORY — DX: Unspecified macular degeneration: H35.30

## 2015-09-01 HISTORY — DX: Hypothyroidism, unspecified: E03.9

## 2015-09-01 HISTORY — DX: Unspecified right bundle-branch block: I45.10

## 2015-09-01 SURGERY — EXCISION MASS
Anesthesia: Choice | Laterality: Right

## 2015-09-17 ENCOUNTER — Other Ambulatory Visit: Payer: Self-pay | Admitting: Orthopedic Surgery

## 2015-09-17 ENCOUNTER — Telehealth: Payer: Self-pay | Admitting: Cardiology

## 2015-09-17 NOTE — Telephone Encounter (Signed)
Will send to Dr Skains for review and recommendations. 

## 2015-09-17 NOTE — Telephone Encounter (Signed)
New message      Request for surgical clearance:  1. What type of surgery is being performed?  Removal of rt thumb mass  2. When is this surgery scheduled? 09-22-15  Are there any medications that need to be held prior to surgery and how long? Stop aspirin and need cardiac clearance because of past cath 3. Name of physician performing surgery?  Dr Leanora Cover   4. What is your office phone and fax number?  Fax [phone]

## 2015-09-18 ENCOUNTER — Encounter (HOSPITAL_BASED_OUTPATIENT_CLINIC_OR_DEPARTMENT_OTHER): Payer: Self-pay | Admitting: *Deleted

## 2015-09-18 NOTE — Telephone Encounter (Signed)
Faxed to [facility] to # indicated.

## 2015-09-18 NOTE — Telephone Encounter (Signed)
He may proceed with thumb surgery with low overall cardiac risk. He may stop ASA for 7 days prior to reduce risk of bleeding.   Candee Furbish, MD

## 2015-09-21 ENCOUNTER — Encounter (HOSPITAL_BASED_OUTPATIENT_CLINIC_OR_DEPARTMENT_OTHER)
Admission: RE | Admit: 2015-09-21 | Discharge: 2015-09-21 | Disposition: A | Discharge status: Home / Self Care | Payer: Medicare Other | Source: Ambulatory Visit | Attending: Orthopedic Surgery | Admitting: Orthopedic Surgery

## 2015-09-21 DIAGNOSIS — M109 Gout, unspecified: Secondary | ICD-10-CM | POA: Diagnosis not present

## 2015-09-21 DIAGNOSIS — I10 Essential (primary) hypertension: Secondary | ICD-10-CM | POA: Diagnosis not present

## 2015-09-21 DIAGNOSIS — L720 Epidermal cyst: Secondary | ICD-10-CM | POA: Diagnosis not present

## 2015-09-21 DIAGNOSIS — R2231 Localized swelling, mass and lump, right upper limb: Secondary | ICD-10-CM | POA: Diagnosis present

## 2015-09-21 DIAGNOSIS — I4510 Unspecified right bundle-branch block: Secondary | ICD-10-CM | POA: Diagnosis not present

## 2015-09-21 DIAGNOSIS — Z79899 Other long term (current) drug therapy: Secondary | ICD-10-CM | POA: Diagnosis not present

## 2015-09-21 DIAGNOSIS — K219 Gastro-esophageal reflux disease without esophagitis: Secondary | ICD-10-CM | POA: Diagnosis not present

## 2015-09-21 DIAGNOSIS — E039 Hypothyroidism, unspecified: Secondary | ICD-10-CM | POA: Diagnosis not present

## 2015-09-21 DIAGNOSIS — Z7982 Long term (current) use of aspirin: Secondary | ICD-10-CM | POA: Diagnosis not present

## 2015-09-21 LAB — BASIC METABOLIC PANEL
ANION GAP: 6 (ref 5–15)
BUN: 14 mg/dL (ref 6–20)
CALCIUM: 9.5 mg/dL (ref 8.9–10.3)
CO2: 27 mmol/L (ref 22–32)
CREATININE: 1.01 mg/dL (ref 0.61–1.24)
Chloride: 108 mmol/L (ref 101–111)
GLUCOSE: 106 mg/dL — AB (ref 65–99)
Potassium: 4.8 mmol/L (ref 3.5–5.1)
Sodium: 141 mmol/L (ref 135–145)

## 2015-09-22 ENCOUNTER — Encounter (HOSPITAL_BASED_OUTPATIENT_CLINIC_OR_DEPARTMENT_OTHER): Payer: Self-pay

## 2015-09-22 ENCOUNTER — Encounter (HOSPITAL_BASED_OUTPATIENT_CLINIC_OR_DEPARTMENT_OTHER)
Admission: RE | Disposition: A | Discharge status: Home / Self Care | Payer: Self-pay | Source: Ambulatory Visit | Attending: Orthopedic Surgery

## 2015-09-22 ENCOUNTER — Ambulatory Visit (HOSPITAL_BASED_OUTPATIENT_CLINIC_OR_DEPARTMENT_OTHER)
Admission: RE | Admit: 2015-09-22 | Discharge: 2015-09-22 | Disposition: A | Discharge status: Home / Self Care | Payer: Medicare Other | Source: Ambulatory Visit | Attending: Orthopedic Surgery | Admitting: Orthopedic Surgery

## 2015-09-22 ENCOUNTER — Ambulatory Visit (HOSPITAL_BASED_OUTPATIENT_CLINIC_OR_DEPARTMENT_OTHER): Payer: Medicare Other | Admitting: Certified Registered"

## 2015-09-22 DIAGNOSIS — M109 Gout, unspecified: Secondary | ICD-10-CM | POA: Insufficient documentation

## 2015-09-22 DIAGNOSIS — Z79899 Other long term (current) drug therapy: Secondary | ICD-10-CM | POA: Diagnosis not present

## 2015-09-22 DIAGNOSIS — E039 Hypothyroidism, unspecified: Secondary | ICD-10-CM | POA: Diagnosis not present

## 2015-09-22 DIAGNOSIS — R2231 Localized swelling, mass and lump, right upper limb: Secondary | ICD-10-CM | POA: Diagnosis not present

## 2015-09-22 DIAGNOSIS — Z7982 Long term (current) use of aspirin: Secondary | ICD-10-CM | POA: Diagnosis not present

## 2015-09-22 DIAGNOSIS — K219 Gastro-esophageal reflux disease without esophagitis: Secondary | ICD-10-CM | POA: Insufficient documentation

## 2015-09-22 DIAGNOSIS — N183 Chronic kidney disease, stage 3 (moderate): Secondary | ICD-10-CM | POA: Diagnosis not present

## 2015-09-22 DIAGNOSIS — I10 Essential (primary) hypertension: Secondary | ICD-10-CM | POA: Diagnosis not present

## 2015-09-22 DIAGNOSIS — I4510 Unspecified right bundle-branch block: Secondary | ICD-10-CM | POA: Diagnosis not present

## 2015-09-22 DIAGNOSIS — I129 Hypertensive chronic kidney disease with stage 1 through stage 4 chronic kidney disease, or unspecified chronic kidney disease: Secondary | ICD-10-CM | POA: Diagnosis not present

## 2015-09-22 DIAGNOSIS — L720 Epidermal cyst: Secondary | ICD-10-CM | POA: Insufficient documentation

## 2015-09-22 HISTORY — PX: MASS EXCISION: SHX2000

## 2015-09-22 SURGERY — EXCISION MASS
Anesthesia: Monitor Anesthesia Care | Site: Thumb | Laterality: Right

## 2015-09-22 MED ORDER — BUPIVACAINE HCL (PF) 0.25 % IJ SOLN
INTRAMUSCULAR | Status: AC
  Filled 2015-09-22: qty 30

## 2015-09-22 MED ORDER — PROPOFOL 500 MG/50ML IV EMUL
INTRAVENOUS | Status: DC | PRN
  Administered 2015-09-22: 75 ug/kg/min via INTRAVENOUS

## 2015-09-22 MED ORDER — CHLORHEXIDINE GLUCONATE 4 % EX LIQD: 60.0000 mL | Freq: Once | CUTANEOUS | Status: DC

## 2015-09-22 MED ORDER — MIDAZOLAM HCL 2 MG/2ML IJ SOLN: 1.0000 mg | INTRAMUSCULAR | Status: DC | PRN

## 2015-09-22 MED ORDER — HYDROCODONE-ACETAMINOPHEN 5-325 MG PO TABS: ORAL_TABLET | ORAL | Status: DC

## 2015-09-22 MED ORDER — FENTANYL CITRATE (PF) 100 MCG/2ML IJ SOLN
50.0000 ug | INTRAMUSCULAR | Status: DC | PRN
  Administered 2015-09-22: 50 ug via INTRAVENOUS

## 2015-09-22 MED ORDER — ONDANSETRON HCL 4 MG/2ML IJ SOLN
INTRAMUSCULAR | Status: DC | PRN
  Administered 2015-09-22: 4 mg via INTRAVENOUS

## 2015-09-22 MED ORDER — GLYCOPYRROLATE 0.2 MG/ML IJ SOLN: 0.2000 mg | Freq: Once | INTRAMUSCULAR | Status: DC | PRN

## 2015-09-22 MED ORDER — PROPOFOL 10 MG/ML IV BOLUS
INTRAVENOUS | Status: AC
  Filled 2015-09-22: qty 20

## 2015-09-22 MED ORDER — LIDOCAINE HCL (PF) 1 % IJ SOLN
INTRAMUSCULAR | Status: AC
  Filled 2015-09-22: qty 30

## 2015-09-22 MED ORDER — CEFAZOLIN SODIUM-DEXTROSE 2-4 GM/100ML-% IV SOLN
INTRAVENOUS | Status: AC
  Filled 2015-09-22: qty 100

## 2015-09-22 MED ORDER — FENTANYL CITRATE (PF) 100 MCG/2ML IJ SOLN
INTRAMUSCULAR | Status: AC
  Filled 2015-09-22: qty 2

## 2015-09-22 MED ORDER — ONDANSETRON HCL 4 MG/2ML IJ SOLN
INTRAMUSCULAR | Status: AC
  Filled 2015-09-22: qty 2

## 2015-09-22 MED ORDER — SCOPOLAMINE 1 MG/3DAYS TD PT72
1.0000 | MEDICATED_PATCH | Freq: Once | TRANSDERMAL | Status: DC | PRN
Start: 2015-09-22 — End: 2015-09-22

## 2015-09-22 MED ORDER — LACTATED RINGERS IV SOLN
INTRAVENOUS | Status: DC
  Administered 2015-09-22: 13:00:00 via INTRAVENOUS

## 2015-09-22 MED ORDER — LIDOCAINE 2% (20 MG/ML) 5 ML SYRINGE
INTRAMUSCULAR | Status: DC | PRN
  Administered 2015-09-22: 50 mg via INTRAVENOUS

## 2015-09-22 MED ORDER — CEFAZOLIN SODIUM-DEXTROSE 2-4 GM/100ML-% IV SOLN
2.0000 g | INTRAVENOUS | Status: AC
  Administered 2015-09-22: 2 g via INTRAVENOUS

## 2015-09-22 MED ORDER — LIDOCAINE 2% (20 MG/ML) 5 ML SYRINGE
INTRAMUSCULAR | Status: AC
  Filled 2015-09-22: qty 5

## 2015-09-22 MED ORDER — LIDOCAINE HCL 1 % IJ SOLN
INTRAMUSCULAR | Status: DC | PRN
  Administered 2015-09-22: 10 mL via INTRAMUSCULAR

## 2015-09-22 SURGICAL SUPPLY — 55 items
BANDAGE ACE 3X5.8 VEL STRL LF (GAUZE/BANDAGES/DRESSINGS) IMPLANT
BANDAGE COBAN STERILE 2 (GAUZE/BANDAGES/DRESSINGS) IMPLANT
BENZOIN TINCTURE PRP APPL 2/3 (GAUZE/BANDAGES/DRESSINGS) IMPLANT
BLADE MINI RND TIP GREEN BEAV (BLADE) IMPLANT
BLADE SURG 15 STRL LF DISP TIS (BLADE) ×2 IMPLANT
BLADE SURG 15 STRL SS (BLADE) ×4
BNDG COHESIVE 1X5 TAN STRL LF (GAUZE/BANDAGES/DRESSINGS) ×3 IMPLANT
BNDG CONFORM 2 STRL LF (GAUZE/BANDAGES/DRESSINGS) ×3 IMPLANT
BNDG ELASTIC 2X5.8 VLCR STR LF (GAUZE/BANDAGES/DRESSINGS) IMPLANT
BNDG ESMARK 4X9 LF (GAUZE/BANDAGES/DRESSINGS) ×3 IMPLANT
BNDG GAUZE 1X2.1 STRL (MISCELLANEOUS) IMPLANT
BNDG GAUZE ELAST 4 BULKY (GAUZE/BANDAGES/DRESSINGS) IMPLANT
BNDG PLASTER X FAST 3X3 WHT LF (CAST SUPPLIES) IMPLANT
CHLORAPREP W/TINT 26ML (MISCELLANEOUS) ×3 IMPLANT
CLOSURE WOUND 1/2 X4 (GAUZE/BANDAGES/DRESSINGS)
CORDS BIPOLAR (ELECTRODE) ×3 IMPLANT
COVER BACK TABLE 60X90IN (DRAPES) ×3 IMPLANT
COVER MAYO STAND STRL (DRAPES) ×3 IMPLANT
CUFF TOURNIQUET SINGLE 18IN (TOURNIQUET CUFF) ×3 IMPLANT
DRAPE EXTREMITY T 121X128X90 (DRAPE) ×3 IMPLANT
DRAPE SURG 17X23 STRL (DRAPES) ×3 IMPLANT
GAUZE SPONGE 4X4 12PLY STRL (GAUZE/BANDAGES/DRESSINGS) ×3 IMPLANT
GAUZE XEROFORM 1X8 LF (GAUZE/BANDAGES/DRESSINGS) ×3 IMPLANT
GLOVE BIO SURGEON STRL SZ 6.5 (GLOVE) ×2 IMPLANT
GLOVE BIO SURGEON STRL SZ7.5 (GLOVE) ×3 IMPLANT
GLOVE BIO SURGEONS STRL SZ 6.5 (GLOVE) ×1
GLOVE BIOGEL PI IND STRL 7.0 (GLOVE) ×1 IMPLANT
GLOVE BIOGEL PI IND STRL 8 (GLOVE) ×1 IMPLANT
GLOVE BIOGEL PI INDICATOR 7.0 (GLOVE) ×2
GLOVE BIOGEL PI INDICATOR 8 (GLOVE) ×2
GLOVE SURG SS PI 6.5 STRL IVOR (GLOVE) ×3 IMPLANT
GOWN STRL REUS W/ TWL LRG LVL3 (GOWN DISPOSABLE) ×1 IMPLANT
GOWN STRL REUS W/TWL LRG LVL3 (GOWN DISPOSABLE) ×2
GOWN STRL REUS W/TWL XL LVL3 (GOWN DISPOSABLE) ×3 IMPLANT
NEEDLE HYPO 25X1 1.5 SAFETY (NEEDLE) ×3 IMPLANT
NS IRRIG 1000ML POUR BTL (IV SOLUTION) ×3 IMPLANT
PACK BASIN DAY SURGERY FS (CUSTOM PROCEDURE TRAY) ×3 IMPLANT
PAD CAST 3X4 CTTN HI CHSV (CAST SUPPLIES) IMPLANT
PAD CAST 4YDX4 CTTN HI CHSV (CAST SUPPLIES) IMPLANT
PADDING CAST ABS 4INX4YD NS (CAST SUPPLIES) ×2
PADDING CAST ABS COTTON 4X4 ST (CAST SUPPLIES) ×1 IMPLANT
PADDING CAST COTTON 3X4 STRL (CAST SUPPLIES)
PADDING CAST COTTON 4X4 STRL (CAST SUPPLIES)
SLEEVE SCD COMPRESS KNEE MED (MISCELLANEOUS) ×3 IMPLANT
STOCKINETTE 4X48 STRL (DRAPES) ×3 IMPLANT
STRIP CLOSURE SKIN 1/2X4 (GAUZE/BANDAGES/DRESSINGS) IMPLANT
SUT ETHILON 3 0 PS 1 (SUTURE) IMPLANT
SUT ETHILON 4 0 PS 2 18 (SUTURE) ×3 IMPLANT
SUT ETHILON 5 0 P 3 18 (SUTURE)
SUT NYLON ETHILON 5-0 P-3 1X18 (SUTURE) IMPLANT
SUT VIC AB 4-0 P2 18 (SUTURE) IMPLANT
SYR BULB 3OZ (MISCELLANEOUS) ×3 IMPLANT
SYR CONTROL 10ML LL (SYRINGE) ×3 IMPLANT
TOWEL OR 17X24 6PK STRL BLUE (TOWEL DISPOSABLE) ×6 IMPLANT
UNDERPAD 30X30 (UNDERPADS AND DIAPERS) ×3 IMPLANT

## 2015-09-22 NOTE — Discharge Instructions (Addendum)

## 2015-09-22 NOTE — Op Note (Signed)
[id number] 

## 2015-09-22 NOTE — Anesthesia Postprocedure Evaluation (Signed)
Anesthesia Post Note  Patient: Samuel Little  Procedure(s) Performed: Procedure(s) (LRB): RIGHT THUMB EXCISION MASS (Right)  Patient location during evaluation: PACU Anesthesia Type: MAC Level of consciousness: awake and alert Pain management: pain level controlled Vital Signs Assessment: post-procedure vital signs reviewed and stable Respiratory status: spontaneous breathing, nonlabored ventilation, respiratory function stable and patient connected to nasal cannula oxygen Cardiovascular status: stable and blood pressure returned to baseline Anesthetic complications: no    Last Vitals:  Filed Vitals:   09/22/15 1430 09/22/15 1457  BP: 142/86 164/77  Pulse: 58 60  Temp:  36.7 C  Resp: 20 18    Last Pain:  Filed Vitals:   09/22/15 1500  PainSc: 0-No pain                 Catalina Gravel

## 2015-09-22 NOTE — Anesthesia Preprocedure Evaluation (Addendum)
Anesthesia Evaluation  Patient identified by MRN, date of birth, ID band Patient awake    Reviewed: Allergy & Precautions, NPO status , Patient's Chart, lab work & pertinent test results  Airway Mallampati: II  TM Distance: >3 FB Neck ROM: Full    Dental  (+) Teeth Intact, Dental Advisory Given, Missing, Chipped,    Pulmonary neg pulmonary ROS,    Pulmonary exam normal breath sounds clear to auscultation       Cardiovascular hypertension, Pt. 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  Reproductive/Obstetrics                          Anesthesia Physical Anesthesia Plan  ASA: III  Anesthesia Plan: MAC   Post-op Pain Management:    Induction: Intravenous  Airway Management Planned: Nasal Cannula  Additional Equipment:   Intra-op Plan:   Post-operative Plan:   Informed Consent: I have reviewed the patients History and Physical, chart, labs and discussed the procedure including the risks, benefits and alternatives for the proposed anesthesia with the patient or authorized representative who has indicated his/her understanding and acceptance.   Dental advisory given  Plan Discussed with: CRNA and Anesthesiologist  Anesthesia Plan Comments: (Discussed  risks/benefits/alternatives to MAC sedation including need for ventilatory support, hypotension, need for conversion to general anesthesia.  All patient questions answered.  Patient/guardian wishes to proceed.  Will discuss MAC plus digital nerve block with surgeon.  Backup GA with LMA.)        Anesthesia Quick Evaluation

## 2015-09-22 NOTE — H&P (Signed)
Samuel Little is an 82 y.o. male.   Chief Complaint: right thumb mass HPI: 82 yo rhd male with mass right thumb x 1 year.  It is bothersome to him.  It has been slowly enlarging.  He wishes to have it removed.  Allergies:  Allergies  Allergen Reactions  . Celecoxib Nausea And Vomiting  . Meperidine Hcl     REACTION: Nausea  . Nsaids Other (See Comments)    jaundice    Past Medical History  Diagnosis Date  . Hypertension   . Thyroid disease     hypothyroidism  . Gout   . Pleural effusion, left   . Gastroesophageal reflux disease   . Arthritis   . Arrhythmia     h/o atrioventricular node reetrant tachycardia (status post radio frequecy catheter ablation , march 2,2011   . Hypothyroidism   . Macular degeneration   . Right bundle branch block     Past Surgical History  Procedure Laterality Date  . Cardiac electrophysiology study and ablation    . Cardiac catheterization  2007  . Hernia repair    . Rotator cuff repair    . Tonsillectomy    . Cataract extraction      Family History: Family History  Problem Relation Age of Onset  . Hypertension Mother   . Diabetes Mother   . Hypertension Father   . Heart attack Father   . Diabetes Father   . Heart attack Brother   . Heart attack Paternal Uncle   . Heart attack Paternal Uncle   . Heart attack Paternal Uncle   . Other Son     Leak in aorta    Social History:   reports that he has never smoked. He does not have any smokeless tobacco history on file. He reports that he does not drink alcohol or use illicit drugs.  Medications: Medications Prior to Admission  Medication Sig Dispense Refill  . Acetaminophen (TYLENOL ARTHRITIS PAIN PO) Take 650 mg by mouth daily as needed (pain).     . Ascorbic Acid (VITAMIN C) 500 MG CAPS Take 500 mg by mouth daily.     Marland Kitchen aspirin EC 81 MG tablet Take 81 mg by mouth daily.    . Bilberry, Vaccinium myrtillus, (BILBERRY PO) Take 1 tablet by mouth daily.     . Calcium-Vitamin D  (CALTRATE 600 PLUS-VIT D PO) Take 1 tablet by mouth daily.     . Cholecalciferol (VITAMIN D) 2000 UNITS tablet Take 2,000 Units by mouth daily.    . Cobalamine Combinations (VITAMIN B12-FOLIC ACID PO) Take 1 tablet by mouth daily.     . Coenzyme Q10 (CO Q 10 PO) Take 1 tablet by mouth daily.     . colchicine 0.6 MG tablet Take 0.6 mg by mouth daily as needed (gout).     . CORAL CALCIUM PO Take 2 tablets by mouth daily.    Marland Kitchen CRANBERRY PO Take 1 tablet by mouth daily.     Marland Kitchen doxazosin (CARDURA) 4 MG tablet Take 4 mg by mouth daily.    Marland Kitchen GINKGO BILOBA PO Take 1 tablet by mouth daily.    . Glucos-Chondroit-Hyaluron-MSM (GLUCOSAMINE CHONDROITIN JOINT PO) Take 1 tablet by mouth daily.     Marland Kitchen KRILL OIL ULTRA STRENGTH PO Take 1 tablet by mouth daily.     Marland Kitchen levothyroxine (SYNTHROID, LEVOTHROID) 175 MCG tablet Take 175 mcg by mouth daily before breakfast.    . lisinopril-hydrochlorothiazide (PRINZIDE,ZESTORETIC) 20-12.5 MG per tablet Take 1  tablet by mouth daily.    . Misc Natural Products (BLACK CHERRY CONCENTRATE PO) Take 1 tablet by mouth daily.     . multivitamin-lutein (OCUVITE-LUTEIN) CAPS capsule Take 2 capsules by mouth 2 (two) times daily.     Marland Kitchen omeprazole (PRILOSEC) 20 MG capsule Take 20 mg by mouth daily.    . probenecid (BENEMID) 500 MG tablet Take 500 mg by mouth daily.    Marland Kitchen RESVERATROL PO Take 1 tablet by mouth daily.     Marland Kitchen senna (SENOKOT) 8.6 MG TABS tablet Take 1 tablet (8.6 mg total) by mouth at bedtime as needed for mild constipation. 10 each 0  . TURMERIC PO Take 1 tablet by mouth daily.     . tamsulosin (FLOMAX) 0.4 MG CAPS capsule Take 1 capsule (0.4 mg total) by mouth daily after supper. 30 capsule 0  . traMADol (ULTRAM) 50 MG tablet Take 50 mg by mouth every 6 (six) hours as needed for moderate pain.       Results for orders placed or performed during the hospital encounter of 09/22/15 (from the past 48 hour(s))  Basic metabolic panel     Status: Abnormal   Collection Time:  09/21/15  9:20 AM  Result Value Ref Range   Sodium 141 135 - 145 mmol/L   Potassium 4.8 3.5 - 5.1 mmol/L   Chloride 108 101 - 111 mmol/L   CO2 27 22 - 32 mmol/L   Glucose, Bld 106 (H) 65 - 99 mg/dL   BUN 14 6 - 20 mg/dL   Creatinine, Ser 1.01 0.61 - 1.24 mg/dL   Calcium 9.5 8.9 - 10.3 mg/dL   GFR calc non Af Amer >60 >60 mL/min   GFR calc Af Amer >60 >60 mL/min    Comment: (NOTE) The eGFR has been calculated using the CKD EPI equation. This calculation has not been validated in all clinical situations. eGFR's persistently <60 mL/min signify possible Chronic Kidney Disease.    Anion gap 6 5 - 15    No results found.   A comprehensive review of systems was negative.  Blood pressure 153/68, pulse 67, temperature 98.2 F (36.8 C), temperature source Oral, resp. rate 18, height 6' (1.829 m), weight 93.895 kg (207 lb), SpO2 97 %.  General appearance: alert, cooperative and appears stated age Head: Normocephalic, without obvious abnormality, atraumatic Neck: supple, symmetrical, trachea midline Resp: clear to auscultation bilaterally Cardio: regular rate and rhythm GI: non-tender Extremities: Intact sensation and capillary refill all digits.  +epl/fpl/io.  No wounds.  Pulses: 2+ and symmetric Skin: Skin color, texture, turgor normal. No rashes or lesions Neurologic: Grossly normal Incision/Wound:none  Assessment/Plan Right thumb mass.  Non operative and operative treatment options were discussed with the patient and patient wishes to proceed with operative treatment. Risks, benefits, and alternatives of surgery were discussed and the patient agrees with the plan of care.   Genny Caulder R 09/22/2015, 1:36 PM

## 2015-09-22 NOTE — Brief Op Note (Signed)
09/22/2015  2:11 PM  PATIENT:  Samuel Little  82 y.o. male  PRE-OPERATIVE DIAGNOSIS:  RIGHT THUMB MASS  POST-OPERATIVE DIAGNOSIS:  RIGHT THUMB MASS  PROCEDURE:  Procedure(s): RIGHT THUMB EXCISION MASS (Right)  SURGEON:  Surgeon(s) and Role:    * Leanora Cover, MD - Primary  PHYSICIAN ASSISTANT:   ASSISTANTS: none   ANESTHESIA:   local and MAC  EBL:     BLOOD ADMINISTERED:none  DRAINS: none   LOCAL MEDICATIONS USED:  MARCAINE    and LIDOCAINE   SPECIMEN:  Source of Specimen:  right thumb  DISPOSITION OF SPECIMEN:  PATHOLOGY  COUNTS:  YES  TOURNIQUET:   Total Tourniquet Time Documented: Forearm (Right) - 12 minutes Total: Forearm (Right) - 12 minutes   DICTATION: .Other Dictation: Dictation Number [id number]  PLAN OF CARE: Discharge to home after PACU  PATIENT DISPOSITION:  PACU - hemodynamically stable.

## 2015-09-22 NOTE — Transfer of Care (Signed)
Immediate Anesthesia Transfer of Care Note  Patient: Samuel Little Seen  Procedure(s) Performed: Procedure(s): RIGHT THUMB EXCISION MASS (Right)  Patient Location: PACU  Anesthesia Type:MAC  Level of Consciousness: awake, sedated and patient cooperative  Airway & Oxygen Therapy: Patient Spontanous Breathing and Patient connected to face mask oxygen  Post-op Assessment: Report given to RN and Post -op Vital signs reviewed and stable  Post vital signs: Reviewed and stable  Last Vitals:  Filed Vitals:   09/22/15 1416 09/22/15 1417  BP:    Pulse: 54 54  Temp:    Resp:  41    Last Pain: There were no vitals filed for this visit.       Complications: No apparent anesthesia complications

## 2015-09-23 DIAGNOSIS — L570 Actinic keratosis: Secondary | ICD-10-CM | POA: Diagnosis not present

## 2015-09-23 DIAGNOSIS — C44311 Basal cell carcinoma of skin of nose: Secondary | ICD-10-CM | POA: Diagnosis not present

## 2015-09-23 DIAGNOSIS — X32XXXD Exposure to sunlight, subsequent encounter: Secondary | ICD-10-CM | POA: Diagnosis not present

## 2015-09-23 NOTE — Op Note (Signed)
NAMEDEMONTREZ, ARDIS NO.:  [id number]  MEDICAL RECORD NO.:  [id number]  LOCATION:                                 FACILITY:  PHYSICIAN:  Leanora Cover, MD        DATE OF BIRTH:  1932-11-14  DATE OF PROCEDURE:  09/22/2015 DATE OF DISCHARGE:                              OPERATIVE REPORT   PREOPERATIVE DIAGNOSIS:  Right thumb mass.  POSTOPERATIVE DIAGNOSIS:  Right thumb inclusion cyst.  PROCEDURE:  Excision of mass, right thumb, 1.6 cm in diameter from subfascial space.  SURGEON:  Leanora Cover, MD.  ASSISTANT:  None.  ANESTHESIA:  Digital block with MAC.  IV FLUIDS:  Per anesthesia flow sheet.  ESTIMATED BLOOD LOSS:  Minimal.  COMPLICATIONS:  None.  SPECIMENS:  Right thumb mass to Pathology.  TOURNIQUET TIME:  12 minutes.  DISPOSITION:  Stable to PACU.  INDICATIONS:  The patient is an 82 year old male who has noted a mass on the right thumb for approximately one year.  This is bothersome to him. He wished to have it removed.  Risks, benefits, and alternatives of the surgery were discussed including risk of blood loss; infection; damage to nerves, vessels, tendons, ligaments, bone; failure of surgery; need for additional surgery; complications with wound healing; continued pain; and recurrence of mass.  She voiced understanding of these risks and elected to proceed.  OPERATIVE COURSE:  After being identified preoperatively by myself, the patient and I gave agreed upon the procedure and site of procedure. Surgical site was marked.  The risks, benefits, and alternatives of surgery were reviewed, and he wished to proceed.  Surgical consent had been signed.  He was transferred to the operating room and placed on the operating room table in a supine position with the right upper extremity on arm board.  MAC anesthesia was induced by Anesthesiology.  A surgical pause was performed between surgeons, Anesthesia, and operating room staff; and all were in  agreement as to the patient, procedure, and site of procedure.  A digital block was performed of the thumb with 10 mL of half and half solution of 1% plain lidocaine and 0.25% plain Marcaine. This was adequate to give digital anesthesia to the thumb.  The right upper extremity was prepped and draped in normal sterile orthopedic fashion.  Surgical pause was again performed between surgeons, Anesthesia, and operating room staff; and all were in agreement as to the patient, procedure, and site of procedure.  Tourniquet at the proximal aspect of the forearm was inflated to 250 mmHg after exsanguination of the limb with an Esmarch bandage.  An incision was made at the radial side of the distal phalanx of the thumb over the mass, the skin and subcutaneous tissues by spreading technique.  The mass was easily identified.  It was cleared of soft tissue attachments. It was adjacent to the bone.  It was easily shelled out from surrounding tissues.  It measured 1.6 cm in widest diameter.  There was a remaining capsule that was also removed, and both specimens were sent to Pathology for examination.  The wound was copiously irrigated with sterile saline. There was redundant skin which was  removed from both the volar and dorsal skin edges.  The 4-0 nylon suture was then used to reapproximate the wound in a horizontal mattress fashion.  The wound was then dressed with sterile Xeroform, 4x4s, and wrapped with a Coban dressing lightly. An Alumafoam splint was placed and wrapped lightly with a Coban dressing.  Tourniquet was deflated at 12 minutes.  Fingertips were pink with brisk capillary refill after deflation of the tourniquet. Operative drapes were broken down.  The patient was awoken from anesthesia safely.  He was transferred back to stretcher and taken to PACU in stable condition.  I will see him back in the office in one week for postoperative followup.  I gave him Norco 5/325, 1-2 p.o. q.6  hours p.r.n. pain, dispensed #20.  He has tramadol at home and is planning on trying this first.     Leanora Cover, MD   ______________________________ Leanora Cover, MD    KK/MEDQ  D:  09/22/2015  T:  09/23/2015  Job:  [id number]

## 2015-09-24 ENCOUNTER — Encounter (HOSPITAL_BASED_OUTPATIENT_CLINIC_OR_DEPARTMENT_OTHER): Payer: Self-pay | Admitting: Orthopedic Surgery

## 2015-10-02 IMAGING — CR DG CHEST 2V
2 series · 2 of 2 positions shown · non-contrast
Comparison: Chest radiograph performed 11/04/2009

CLINICAL DATA: Acute onset of fever, vomiting and chest pain
tonight. Initial encounter.

EXAM:
CHEST  2 VIEW

[w chest pa]
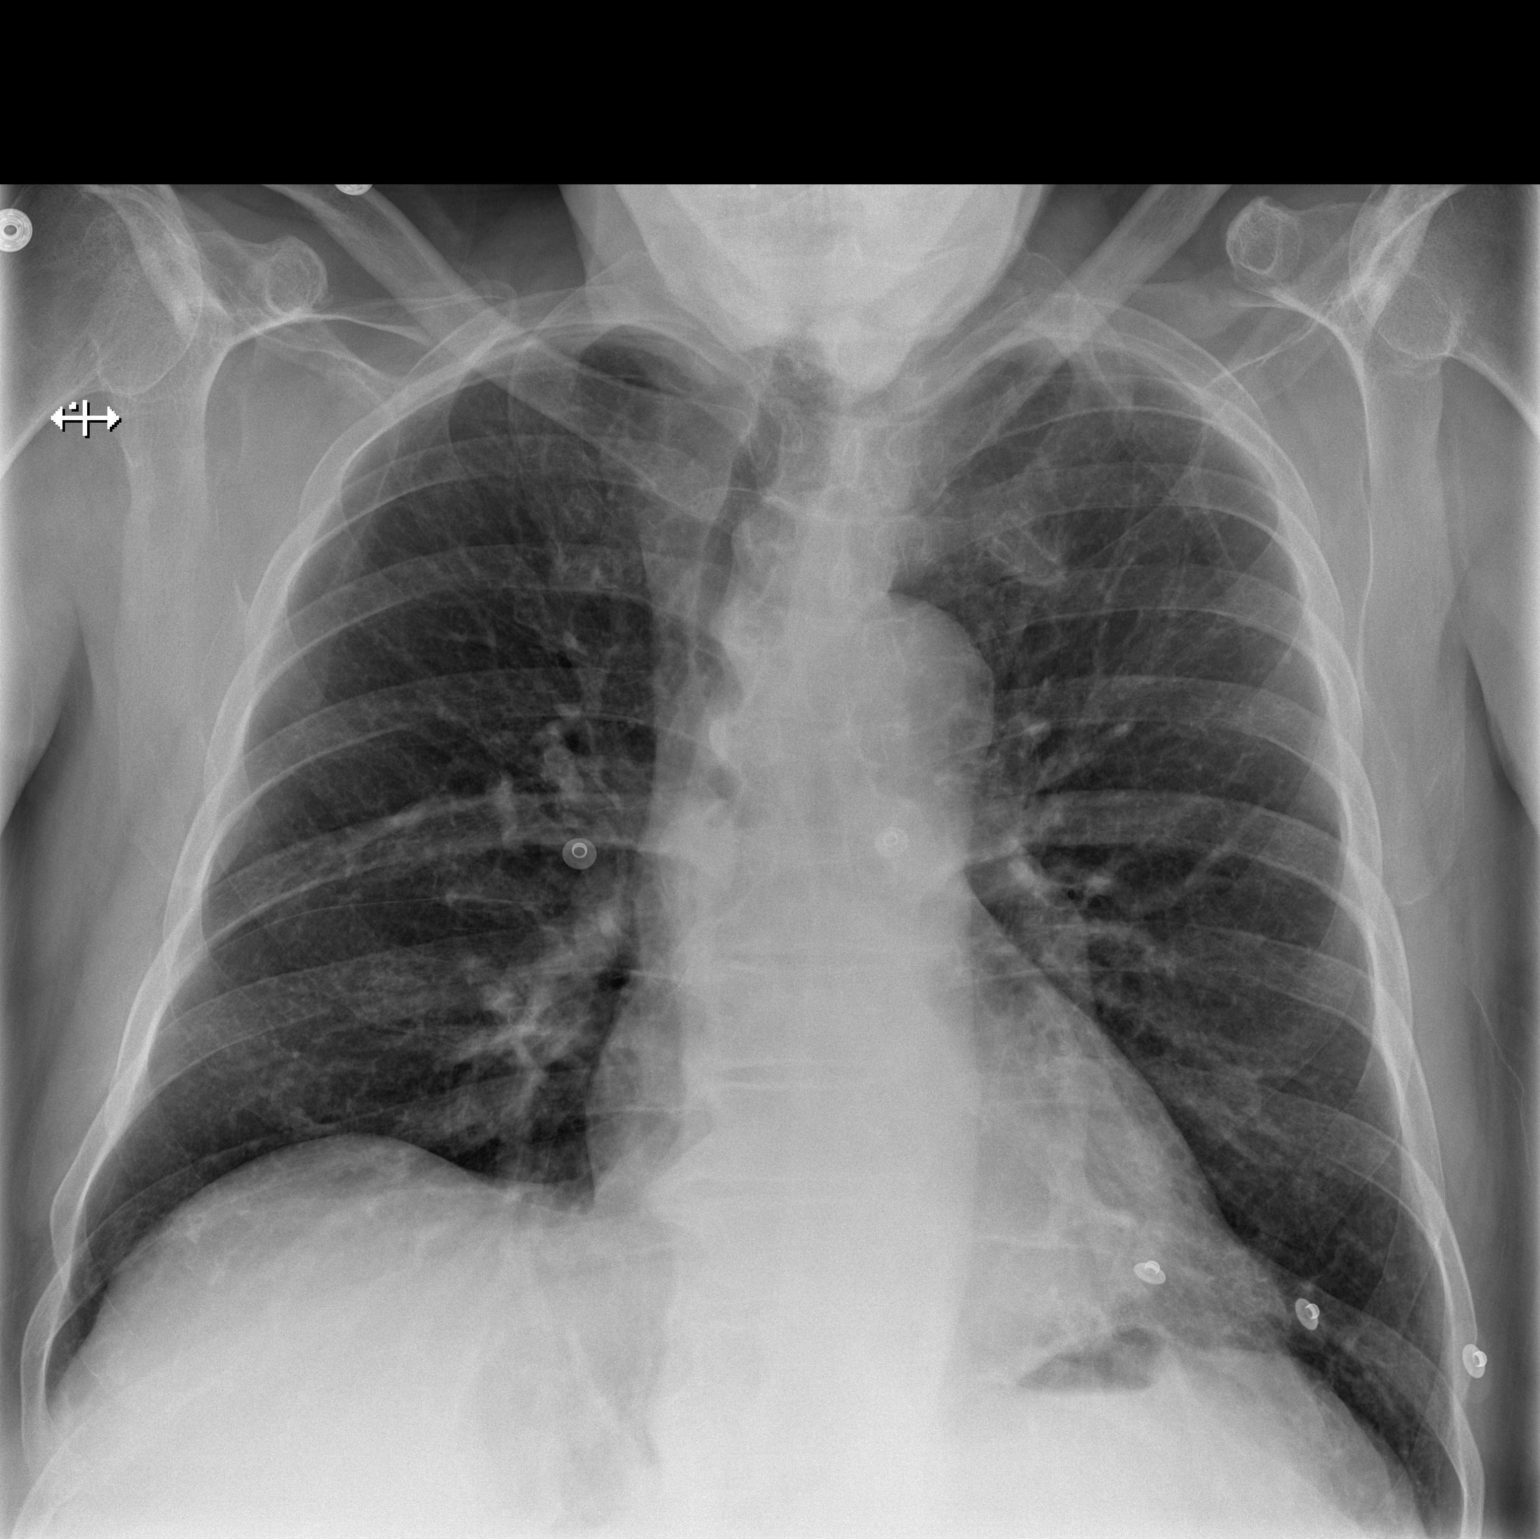

[w chest lat]
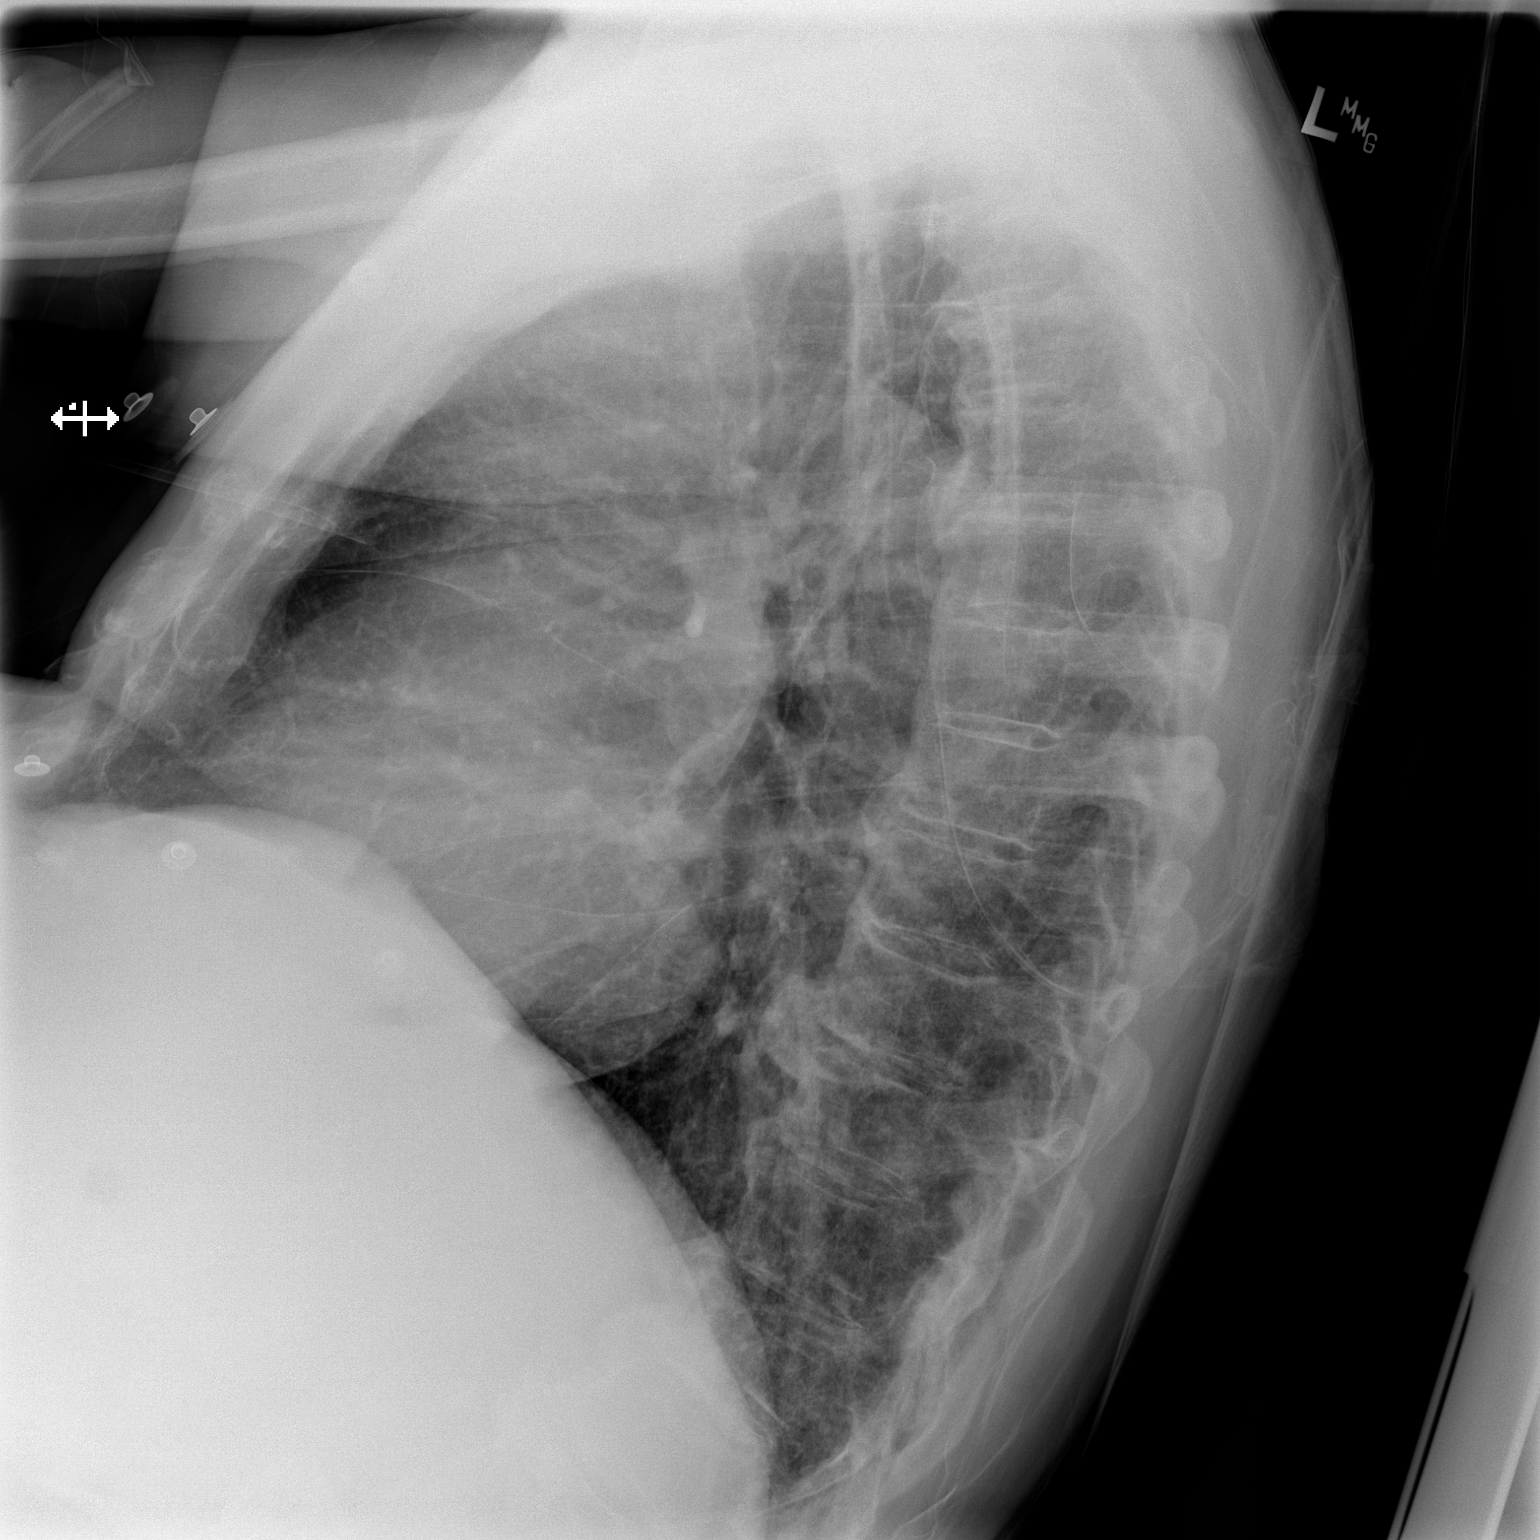

[2 of 2 positions shown; findings below may reference images not displayed]

FINDINGS: The lungs are well-aerated and clear. There is no evidence of focal
opacification, pleural effusion or pneumothorax.

The heart is normal in size; the mediastinal contour is within
normal limits. No acute osseous abnormalities are seen. A mild
chronic compression deformity is suggested at the lower thoracic
spine.
IMPRESSION: No acute cardiopulmonary process seen.

## 2015-10-22 DIAGNOSIS — I471 Supraventricular tachycardia: Secondary | ICD-10-CM | POA: Diagnosis not present

## 2015-10-22 DIAGNOSIS — I10 Essential (primary) hypertension: Secondary | ICD-10-CM | POA: Diagnosis not present

## 2015-10-22 DIAGNOSIS — M109 Gout, unspecified: Secondary | ICD-10-CM | POA: Diagnosis not present

## 2015-10-22 DIAGNOSIS — E039 Hypothyroidism, unspecified: Secondary | ICD-10-CM | POA: Diagnosis not present

## 2015-10-22 DIAGNOSIS — N182 Chronic kidney disease, stage 2 (mild): Secondary | ICD-10-CM | POA: Diagnosis not present

## 2015-10-22 DIAGNOSIS — D696 Thrombocytopenia, unspecified: Secondary | ICD-10-CM | POA: Diagnosis not present

## 2015-11-11 DIAGNOSIS — L570 Actinic keratosis: Secondary | ICD-10-CM | POA: Diagnosis not present

## 2015-11-11 DIAGNOSIS — C44319 Basal cell carcinoma of skin of other parts of face: Secondary | ICD-10-CM | POA: Diagnosis not present

## 2015-11-11 DIAGNOSIS — X32XXXD Exposure to sunlight, subsequent encounter: Secondary | ICD-10-CM | POA: Diagnosis not present

## 2015-12-09 DIAGNOSIS — Z85828 Personal history of other malignant neoplasm of skin: Secondary | ICD-10-CM | POA: Diagnosis not present

## 2015-12-09 DIAGNOSIS — Z08 Encounter for follow-up examination after completed treatment for malignant neoplasm: Secondary | ICD-10-CM | POA: Diagnosis not present

## 2016-01-06 DIAGNOSIS — H353132 Nonexudative age-related macular degeneration, bilateral, intermediate dry stage: Secondary | ICD-10-CM | POA: Diagnosis not present

## 2016-01-14 DIAGNOSIS — Z23 Encounter for immunization: Secondary | ICD-10-CM | POA: Diagnosis not present

## 2016-04-25 DIAGNOSIS — N182 Chronic kidney disease, stage 2 (mild): Secondary | ICD-10-CM | POA: Diagnosis not present

## 2016-04-25 DIAGNOSIS — D696 Thrombocytopenia, unspecified: Secondary | ICD-10-CM | POA: Diagnosis not present

## 2016-04-25 DIAGNOSIS — E782 Mixed hyperlipidemia: Secondary | ICD-10-CM | POA: Diagnosis not present

## 2016-04-25 DIAGNOSIS — R7309 Other abnormal glucose: Secondary | ICD-10-CM | POA: Diagnosis not present

## 2016-04-25 DIAGNOSIS — I471 Supraventricular tachycardia: Secondary | ICD-10-CM | POA: Diagnosis not present

## 2016-04-25 DIAGNOSIS — Z1389 Encounter for screening for other disorder: Secondary | ICD-10-CM | POA: Diagnosis not present

## 2016-04-25 DIAGNOSIS — M1990 Unspecified osteoarthritis, unspecified site: Secondary | ICD-10-CM | POA: Diagnosis not present

## 2016-04-25 DIAGNOSIS — Z0000 Encounter for general adult medical examination without abnormal findings: Secondary | ICD-10-CM | POA: Diagnosis not present

## 2016-04-25 DIAGNOSIS — M109 Gout, unspecified: Secondary | ICD-10-CM | POA: Diagnosis not present

## 2016-04-25 DIAGNOSIS — I10 Essential (primary) hypertension: Secondary | ICD-10-CM | POA: Diagnosis not present

## 2016-04-25 DIAGNOSIS — K219 Gastro-esophageal reflux disease without esophagitis: Secondary | ICD-10-CM | POA: Diagnosis not present

## 2016-04-25 DIAGNOSIS — R202 Paresthesia of skin: Secondary | ICD-10-CM | POA: Diagnosis not present

## 2016-04-25 DIAGNOSIS — N400 Enlarged prostate without lower urinary tract symptoms: Secondary | ICD-10-CM | POA: Diagnosis not present

## 2016-04-25 DIAGNOSIS — E039 Hypothyroidism, unspecified: Secondary | ICD-10-CM | POA: Diagnosis not present

## 2016-05-04 DIAGNOSIS — Z1211 Encounter for screening for malignant neoplasm of colon: Secondary | ICD-10-CM | POA: Diagnosis not present

## 2016-05-13 DIAGNOSIS — H903 Sensorineural hearing loss, bilateral: Secondary | ICD-10-CM | POA: Diagnosis not present

## 2016-06-22 DIAGNOSIS — K2900 Acute gastritis without bleeding: Secondary | ICD-10-CM | POA: Diagnosis not present

## 2016-06-22 DIAGNOSIS — K219 Gastro-esophageal reflux disease without esophagitis: Secondary | ICD-10-CM | POA: Diagnosis not present

## 2016-06-22 DIAGNOSIS — M1990 Unspecified osteoarthritis, unspecified site: Secondary | ICD-10-CM | POA: Diagnosis not present

## 2016-06-23 ENCOUNTER — Emergency Department (HOSPITAL_COMMUNITY): Payer: Medicare Other

## 2016-06-23 ENCOUNTER — Encounter (HOSPITAL_COMMUNITY): Payer: Self-pay

## 2016-06-23 ENCOUNTER — Emergency Department (HOSPITAL_COMMUNITY)
Admission: EM | Admit: 2016-06-23 | Discharge: 2016-06-24 | Disposition: A | Discharge status: Home / Self Care | Payer: Medicare Other | Source: Home / Self Care | Attending: Emergency Medicine | Admitting: Emergency Medicine

## 2016-06-23 DIAGNOSIS — K8020 Calculus of gallbladder without cholecystitis without obstruction: Secondary | ICD-10-CM | POA: Diagnosis not present

## 2016-06-23 DIAGNOSIS — E039 Hypothyroidism, unspecified: Secondary | ICD-10-CM

## 2016-06-23 DIAGNOSIS — N183 Chronic kidney disease, stage 3 (moderate): Secondary | ICD-10-CM | POA: Insufficient documentation

## 2016-06-23 DIAGNOSIS — I471 Supraventricular tachycardia: Secondary | ICD-10-CM | POA: Diagnosis not present

## 2016-06-23 DIAGNOSIS — R1011 Right upper quadrant pain: Secondary | ICD-10-CM

## 2016-06-23 DIAGNOSIS — Z7982 Long term (current) use of aspirin: Secondary | ICD-10-CM | POA: Insufficient documentation

## 2016-06-23 DIAGNOSIS — I119 Hypertensive heart disease without heart failure: Secondary | ICD-10-CM | POA: Diagnosis not present

## 2016-06-23 DIAGNOSIS — E872 Acidosis: Secondary | ICD-10-CM | POA: Diagnosis not present

## 2016-06-23 DIAGNOSIS — I129 Hypertensive chronic kidney disease with stage 1 through stage 4 chronic kidney disease, or unspecified chronic kidney disease: Secondary | ICD-10-CM

## 2016-06-23 DIAGNOSIS — K810 Acute cholecystitis: Secondary | ICD-10-CM | POA: Diagnosis not present

## 2016-06-23 DIAGNOSIS — E871 Hypo-osmolality and hyponatremia: Secondary | ICD-10-CM | POA: Diagnosis not present

## 2016-06-23 DIAGNOSIS — R109 Unspecified abdominal pain: Secondary | ICD-10-CM | POA: Diagnosis not present

## 2016-06-23 LAB — CBC
HEMATOCRIT: 43.6 % (ref 39.0–52.0)
HEMOGLOBIN: 15.2 g/dL (ref 13.0–17.0)
MCH: 32.8 pg (ref 26.0–34.0)
MCHC: 34.9 g/dL (ref 30.0–36.0)
MCV: 94 fL (ref 78.0–100.0)
Platelets: 136 10*3/uL — ABNORMAL LOW (ref 150–400)
RBC: 4.64 MIL/uL (ref 4.22–5.81)
RDW: 14.7 % (ref 11.5–15.5)
WBC: 11.4 10*3/uL — ABNORMAL HIGH (ref 4.0–10.5)

## 2016-06-23 LAB — I-STAT TROPONIN, ED: Troponin i, poc: 0.01 ng/mL (ref 0.00–0.08)

## 2016-06-23 LAB — COMPREHENSIVE METABOLIC PANEL
ALBUMIN: 3.8 g/dL (ref 3.5–5.0)
ALK PHOS: 65 U/L (ref 38–126)
ALT: 24 U/L (ref 17–63)
AST: 28 U/L (ref 15–41)
Anion gap: 10 (ref 5–15)
BUN: 13 mg/dL (ref 6–20)
CO2: 23 mmol/L (ref 22–32)
Calcium: 9.3 mg/dL (ref 8.9–10.3)
Chloride: 97 mmol/L — ABNORMAL LOW (ref 101–111)
Creatinine, Ser: 0.9 mg/dL (ref 0.61–1.24)
GFR calc Af Amer: >60 mL/min (ref >60)
GFR calc non Af Amer: >60 mL/min (ref >60)
GLUCOSE: 138 mg/dL — AB (ref 65–99)
POTASSIUM: 4 mmol/L (ref 3.5–5.1)
SODIUM: 130 mmol/L — AB (ref 135–145)
Total Bilirubin: 2.9 mg/dL — ABNORMAL HIGH (ref 0.3–1.2)
Total Protein: 6.6 g/dL (ref 6.5–8.1)

## 2016-06-23 LAB — URINALYSIS, ROUTINE W REFLEX MICROSCOPIC
Bilirubin Urine: NEGATIVE
Glucose, UA: NEGATIVE mg/dL
HGB URINE DIPSTICK: NEGATIVE
Ketones, ur: NEGATIVE mg/dL
Leukocytes, UA: NEGATIVE
Nitrite: NEGATIVE
PH: 5 (ref 5.0–8.0)
Protein, ur: NEGATIVE mg/dL
SPECIFIC GRAVITY, URINE: 1.015 (ref 1.005–1.030)

## 2016-06-23 LAB — LIPASE, BLOOD: Lipase: 25 U/L (ref 11–51)

## 2016-06-23 MED ORDER — HYDROMORPHONE HCL 1 MG/ML IJ SOLN
1.0000 mg | Freq: Once | INTRAMUSCULAR | Status: AC
  Administered 2016-06-23: 1 mg via INTRAVENOUS
  Filled 2016-06-23: qty 1

## 2016-06-23 MED ORDER — HYDROCODONE-ACETAMINOPHEN 5-325 MG PO TABS
1.0000 | ORAL_TABLET | Freq: Once | ORAL | Status: AC
  Administered 2016-06-23: 1 via ORAL
  Filled 2016-06-23: qty 1

## 2016-06-23 NOTE — ED Provider Notes (Signed)
[facility] DEPT Provider Note   CSN: [id number] Arrival date & time: 06/23/16  [place]     History   Chief Complaint Chief Complaint  Patient presents with  . Abdominal Pain    HPI Samuel Little is a 83 y.o. male.  The history is provided by the patient and the spouse.  Abdominal Pain   This is a new problem. The current episode started 2 days ago. Episode frequency: Intermittently. The problem has been gradually worsening. Associated with: Nothing in particular; started after vomiting episode the night prior. The pain is located in the RUQ. The quality of the pain is sharp. The pain is moderate. Associated symptoms include anorexia. Pertinent negatives include fever, diarrhea, nausea, vomiting, dysuria, frequency, hematuria and arthralgias. The symptoms are aggravated by palpation. Relieved by: Nothing tried.    Past Medical History:  Diagnosis Date  . 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 Arthritis   . Gastroesophageal reflux disease   . Gout   . Hypertension   . Hypothyroidism   . Macular degeneration   . Pleural effusion, left   . Right bundle branch block   . Thyroid disease    hypothyroidism    Patient Active Problem List   Diagnosis Date Noted  . Diverticulitis of cecum 12/20/2013  . BPH (benign prostatic hyperplasia) 12/20/2013  . Bacteremia due to Escherichia coli 12/19/2013  . Gout 12/18/2013  . Sepsis ([place]) 12/18/2013  . CKD (chronic kidney disease), stage III 12/18/2013  . PSVT 04/23/2009  . Hypothyroidism 04/22/2009  . Essential hypertension 04/22/2009  . GERD 04/22/2009  . Arthropathy 04/22/2009    Past Surgical History:  Procedure Laterality Date  . CARDIAC CATHETERIZATION  2007  . CARDIAC ELECTROPHYSIOLOGY STUDY AND ABLATION    . CATARACT EXTRACTION    . HERNIA REPAIR    . MASS EXCISION Right 09/22/2015   Procedure: RIGHT THUMB EXCISION MASS;  Surgeon:  Leanora Cover, MD;  Location: [facility];  Service: Orthopedics;  Laterality: Right;  . ROTATOR CUFF REPAIR    . TONSILLECTOMY         Home Medications    Prior to Admission medications   Medication Sig Start Date End Date Taking? Authorizing Provider  Acetaminophen (TYLENOL ARTHRITIS PAIN PO) Take 650 mg by mouth daily as needed (pain).     Historical Provider, MD  Ascorbic Acid (VITAMIN C) 500 MG CAPS Take 500 mg by mouth daily.     Historical Provider, MD  aspirin EC 81 MG tablet Take 81 mg by mouth daily.    Historical Provider, MD  Bilberry, Vaccinium myrtillus, (BILBERRY PO) Take 1 tablet by mouth daily.     Historical Provider, MD  Calcium-Vitamin D (CALTRATE 600 PLUS-VIT D PO) Take 1 tablet by mouth daily.     Historical Provider, MD  Cholecalciferol (VITAMIN D) 2000 UNITS tablet Take 2,000 Units by mouth daily.    Historical Provider, MD  Cobalamine Combinations (VITAMIN B12-FOLIC ACID PO) Take 1 tablet by mouth daily.     Historical Provider, MD  Coenzyme Q10 (CO Q 10 PO) Take 1 tablet by mouth daily.     Historical Provider, MD  colchicine 0.6 MG tablet Take 0.6 mg by mouth daily as needed (gout).     Historical Provider, MD  CORAL CALCIUM PO Take 2 tablets by mouth daily.    Historical Provider, MD  CRANBERRY PO Take 1 tablet by mouth  daily.     Historical Provider, MD  doxazosin (CARDURA) 4 MG tablet Take 4 mg by mouth daily.    Historical Provider, MD  GINKGO BILOBA PO Take 1 tablet by mouth daily.    Historical Provider, MD  Glucos-Chondroit-Hyaluron-MSM (GLUCOSAMINE CHONDROITIN JOINT PO) Take 1 tablet by mouth daily.     Historical Provider, MD  HYDROcodone-acetaminophen (NORCO/VICODIN) 5-325 MG tablet Take 1-2 tablets by mouth every 8 (eight) hours as needed for severe pain. 06/24/16 06/29/16  Jenny Reichmann, MD  KRILL OIL ULTRA STRENGTH PO Take 1 tablet by mouth daily.     Historical Provider, MD  levothyroxine (SYNTHROID, LEVOTHROID) 175 MCG tablet Take 175  mcg by mouth daily before breakfast.    Historical Provider, MD  lisinopril-hydrochlorothiazide (PRINZIDE,ZESTORETIC) 20-12.5 MG per tablet Take 1 tablet by mouth daily.    Historical Provider, MD  Misc Natural Products (BLACK CHERRY CONCENTRATE PO) Take 1 tablet by mouth daily.     Historical Provider, MD  multivitamin-lutein (OCUVITE-LUTEIN) CAPS capsule Take 2 capsules by mouth 2 (two) times daily.     Historical Provider, MD  omeprazole (PRILOSEC) 20 MG capsule Take 20 mg by mouth daily.    Historical Provider, MD  probenecid (BENEMID) 500 MG tablet Take 500 mg by mouth daily.    Historical Provider, MD  RESVERATROL PO Take 1 tablet by mouth daily.     Historical Provider, MD  senna (SENOKOT) 8.6 MG TABS tablet Take 1 tablet (8.6 mg total) by mouth at bedtime as needed for mild constipation. 12/20/13   Nishant Dhungel, MD  tamsulosin (FLOMAX) 0.4 MG CAPS capsule Take 1 capsule (0.4 mg total) by mouth daily after supper. 12/20/13   Nishant Dhungel, MD  traMADol (ULTRAM) 50 MG tablet Take 50 mg by mouth every 6 (six) hours as needed for moderate pain.     Historical Provider, MD  TURMERIC PO Take 1 tablet by mouth daily.     Historical Provider, MD    Family History Family History  Problem Relation Age of Onset  . Hypertension Mother   . Diabetes Mother   . Hypertension Father   . Heart attack Father   . Diabetes Father   . Heart attack Brother   . Heart attack Paternal Uncle   . Heart attack Paternal Uncle   . Heart attack Paternal Uncle   . Other Son     Leak in aorta    Social History Social History  Substance Use Topics  . Smoking status: Never Smoker  . Smokeless tobacco: Never Used  . Alcohol use No     Allergies   Celecoxib; Meperidine hcl; and Nsaids   Review of Systems Review of Systems  Constitutional: Negative for chills and fever.  HENT: Negative for ear pain and sore throat.   Eyes: Negative for pain and visual disturbance.  Respiratory: Negative for cough  and shortness of breath.   Cardiovascular: Negative for chest pain and palpitations.  Gastrointestinal: Positive for abdominal pain and anorexia. Negative for diarrhea, nausea and vomiting.  Genitourinary: Negative for dysuria, frequency and hematuria.  Musculoskeletal: Negative for arthralgias and back pain.  Skin: Negative for color change and rash.  Neurological: Negative for seizures and syncope.  All other systems reviewed and are negative.   Physical Exam Updated Vital Signs BP (!) 160/85   Pulse 82   Temp 98.6 F (37 C) (Oral)   Resp (!) 28   SpO2 95%   Physical Exam  Constitutional: He appears well-developed and well-nourished.  HENT:  Head: Normocephalic and atraumatic.  Eyes: Conjunctivae are normal.  Neck: Neck supple.  Cardiovascular: Normal rate and regular rhythm.   No murmur heard. Pulmonary/Chest: Effort normal and breath sounds normal. No respiratory distress.  Abdominal: Soft. There is tenderness (RUQ).  No Murphy's sign, no bony rib tenderness  Musculoskeletal: He exhibits no edema.  Neurological: He is alert.  Skin: Skin is warm and dry.  Psychiatric: He has a normal mood and affect.  Nursing note and vitals reviewed.   ED Treatments / Results  Labs (all labs ordered are listed, but only abnormal results are displayed) Labs Reviewed  COMPREHENSIVE METABOLIC PANEL - Abnormal; Notable for the following:       Result Value   Sodium 130 (*)    Chloride 97 (*)    Glucose, Bld 138 (*)    Total Bilirubin 2.9 (*)    All other components within normal limits  CBC - Abnormal; Notable for the following:    WBC 11.4 (*)    Platelets 136 (*)    All other components within normal limits  URINALYSIS, ROUTINE W REFLEX MICROSCOPIC - Abnormal; Notable for the following:    Color, Urine AMBER (*)    APPearance HAZY (*)    All other components within normal limits  LIPASE, BLOOD  I-STAT TROPOININ, ED    EKG  EKG Interpretation  Date/Time:  Thursday June 23 2016 18:36:10 EDT Ventricular Rate:  89 PR Interval:  192 QRS Duration: 138 QT Interval:  388 QTC Calculation: 472 R Axis:   -50 Text Interpretation:  Sinus rhythm with Premature atrial complexes Right bundle branch block Left anterior fascicular block Inferior infarct , age undetermined Anterior infarct , age undetermined No significant change since last tracing Confirmed by [facility]  MD, WHITNEY ([id number]) on 06/23/2016 8:34:08 PM       Radiology US Abdomen Limited Ruq  Result Date: 06/23/2016 CLINICAL DATA:  Initial evaluation for acute right upper quadrant pain. EXAM: US ABDOMEN LIMITED - RIGHT UPPER QUADRANT COMPARISON:  Prior CT from 12/18/2013. FINDINGS: Gallbladder: Stones and sludge present within the gallbladder lumen. Largest stone measured approximately 5 mm. No free pericholecystic fluid. No abnormal gallbladder wall thickening. No sonographic Murphy sign elicited on exam. Common bile duct: Diameter: 4.2 mm Liver: No focal lesion identified. Within normal limits in parenchymal echogenicity. IMPRESSION: 1. Stones and sludge within the gallbladder lumen. No other sonographic features to suggest acute cholecystitis. 2. No biliary dilatation. Electronically Signed   By: Jeannine Boga M.D.   On: 06/23/2016 22:33    Procedures Procedures (including critical care time)  Medications Ordered in ED Medications  HYDROcodone-acetaminophen (NORCO/VICODIN) 5-325 MG per tablet 1 tablet (1 tablet Oral Given 06/23/16 2242)  HYDROmorphone (DILAUDID) injection 1 mg (1 mg Intravenous Given 06/23/16 2350)     Initial Impression / Assessment and Plan / ED Course  I have reviewed the triage vital signs and the nursing notes.  Pertinent labs & imaging results that were available during my care of the patient were reviewed by me and considered in my medical decision making (see chart for details).    Pt presents with RUQ pain. Says he had an episode of vomiting on Tuesday night that he  cannot attribute to anything; the next morning he went to his PCP's office and was sent home w/o an explanation for his symptoms. Later that afternoon, after eating a bowl of cereal, he started having mild RUQ pain. The next morning he continued to have pain,  so called his PCP back & scheduled an appointment for Friday morning, but the pain worsened, so his wife brought him to the ED for evaluation tonight. Denies F/C, HA, lightheadedness, cough, CP, SOB, N/V/D, urinary symptoms.  VS & exam as above. Labs unremarkable. RUQ [place] w/stones and sludge, but no evidence of cholecystitis or choledocholithiasis. PO medications given for pain with minimal effect; 1mg  IV Dilaudid given with significant relief of the Pt's pain.  Explained all results the patient and wife. We'll discharge the patient home with prescription for Norco. Recommending follow-up with General Surgery. ED return precautions provided. Patient and wife acknowledged understanding of, and concurrence with the plan. All questions answered to their satisfaction. In stable condition at time of discharge.  Final Clinical Impressions(s) / ED Diagnoses   Final diagnoses:  RUQ pain  Symptomatic cholelithiasis    New Prescriptions New Prescriptions   HYDROCODONE-ACETAMINOPHEN (NORCO/VICODIN) 5-325 MG TABLET    Take 1-2 tablets by mouth every 8 (eight) hours as needed for severe pain.     Jenny Reichmann, MD 06/24/16 0856    Blanchie Dessert, MD 06/25/16 [phone]

## 2016-06-23 NOTE — ED Triage Notes (Signed)
Pt states he had n/v 2 days ago. He reports he also had some intermittent chest pressure. He reports he now has pain under the right side of his ribcage. He reports the pain is sharp in nature and takes his breath away.

## 2016-06-24 ENCOUNTER — Encounter (HOSPITAL_COMMUNITY): Payer: Self-pay | Admitting: Emergency Medicine

## 2016-06-24 ENCOUNTER — Inpatient Hospital Stay (HOSPITAL_COMMUNITY)
Admission: EM | Admit: 2016-06-24 | Discharge: 2016-06-28 | DRG: 418 | Disposition: A | Discharge status: Home Health | Payer: Medicare Other | Attending: Internal Medicine | Admitting: Internal Medicine

## 2016-06-24 ENCOUNTER — Emergency Department (HOSPITAL_COMMUNITY): Payer: Medicare Other

## 2016-06-24 DIAGNOSIS — K828 Other specified diseases of gallbladder: Secondary | ICD-10-CM | POA: Diagnosis not present

## 2016-06-24 DIAGNOSIS — R000 Tachycardia, unspecified: Secondary | ICD-10-CM | POA: Diagnosis present

## 2016-06-24 DIAGNOSIS — Z79899 Other long term (current) drug therapy: Secondary | ICD-10-CM | POA: Diagnosis not present

## 2016-06-24 DIAGNOSIS — M1990 Unspecified osteoarthritis, unspecified site: Secondary | ICD-10-CM | POA: Diagnosis present

## 2016-06-24 DIAGNOSIS — K810 Acute cholecystitis: Secondary | ICD-10-CM | POA: Diagnosis present

## 2016-06-24 DIAGNOSIS — K219 Gastro-esophageal reflux disease without esophagitis: Secondary | ICD-10-CM | POA: Diagnosis present

## 2016-06-24 DIAGNOSIS — Z7982 Long term (current) use of aspirin: Secondary | ICD-10-CM

## 2016-06-24 DIAGNOSIS — K439 Ventral hernia without obstruction or gangrene: Secondary | ICD-10-CM | POA: Diagnosis present

## 2016-06-24 DIAGNOSIS — D696 Thrombocytopenia, unspecified: Secondary | ICD-10-CM | POA: Diagnosis present

## 2016-06-24 DIAGNOSIS — I119 Hypertensive heart disease without heart failure: Secondary | ICD-10-CM | POA: Diagnosis present

## 2016-06-24 DIAGNOSIS — K5730 Diverticulosis of large intestine without perforation or abscess without bleeding: Secondary | ICD-10-CM | POA: Diagnosis not present

## 2016-06-24 DIAGNOSIS — Z79891 Long term (current) use of opiate analgesic: Secondary | ICD-10-CM

## 2016-06-24 DIAGNOSIS — E039 Hypothyroidism, unspecified: Secondary | ICD-10-CM | POA: Diagnosis present

## 2016-06-24 DIAGNOSIS — R1011 Right upper quadrant pain: Secondary | ICD-10-CM | POA: Diagnosis not present

## 2016-06-24 DIAGNOSIS — M1A9XX0 Chronic gout, unspecified, without tophus (tophi): Secondary | ICD-10-CM | POA: Diagnosis not present

## 2016-06-24 DIAGNOSIS — K5900 Constipation, unspecified: Secondary | ICD-10-CM | POA: Diagnosis present

## 2016-06-24 DIAGNOSIS — Z833 Family history of diabetes mellitus: Secondary | ICD-10-CM

## 2016-06-24 DIAGNOSIS — I4510 Unspecified right bundle-branch block: Secondary | ICD-10-CM | POA: Diagnosis present

## 2016-06-24 DIAGNOSIS — K819 Cholecystitis, unspecified: Secondary | ICD-10-CM

## 2016-06-24 DIAGNOSIS — R739 Hyperglycemia, unspecified: Secondary | ICD-10-CM | POA: Diagnosis present

## 2016-06-24 DIAGNOSIS — E878 Other disorders of electrolyte and fluid balance, not elsewhere classified: Secondary | ICD-10-CM | POA: Diagnosis present

## 2016-06-24 DIAGNOSIS — K660 Peritoneal adhesions (postprocedural) (postinfection): Secondary | ICD-10-CM | POA: Diagnosis present

## 2016-06-24 DIAGNOSIS — E872 Acidosis: Secondary | ICD-10-CM | POA: Diagnosis present

## 2016-06-24 DIAGNOSIS — H3530 Unspecified macular degeneration: Secondary | ICD-10-CM | POA: Diagnosis present

## 2016-06-24 DIAGNOSIS — I471 Supraventricular tachycardia, unspecified: Secondary | ICD-10-CM | POA: Diagnosis present

## 2016-06-24 DIAGNOSIS — Z886 Allergy status to analgesic agent status: Secondary | ICD-10-CM

## 2016-06-24 DIAGNOSIS — R1084 Generalized abdominal pain: Secondary | ICD-10-CM | POA: Diagnosis not present

## 2016-06-24 DIAGNOSIS — I4581 Long QT syndrome: Secondary | ICD-10-CM | POA: Diagnosis present

## 2016-06-24 DIAGNOSIS — R109 Unspecified abdominal pain: Secondary | ICD-10-CM | POA: Diagnosis not present

## 2016-06-24 DIAGNOSIS — Z888 Allergy status to other drugs, medicaments and biological substances status: Secondary | ICD-10-CM | POA: Diagnosis not present

## 2016-06-24 DIAGNOSIS — E871 Hypo-osmolality and hyponatremia: Secondary | ICD-10-CM | POA: Diagnosis not present

## 2016-06-24 DIAGNOSIS — R9431 Abnormal electrocardiogram [ECG] [EKG]: Secondary | ICD-10-CM

## 2016-06-24 DIAGNOSIS — R188 Other ascites: Secondary | ICD-10-CM | POA: Diagnosis not present

## 2016-06-24 DIAGNOSIS — M79671 Pain in right foot: Secondary | ICD-10-CM | POA: Diagnosis present

## 2016-06-24 DIAGNOSIS — Z9849 Cataract extraction status, unspecified eye: Secondary | ICD-10-CM

## 2016-06-24 DIAGNOSIS — M109 Gout, unspecified: Secondary | ICD-10-CM | POA: Diagnosis present

## 2016-06-24 DIAGNOSIS — R935 Abnormal findings on diagnostic imaging of other abdominal regions, including retroperitoneum: Secondary | ICD-10-CM | POA: Diagnosis not present

## 2016-06-24 DIAGNOSIS — I10 Essential (primary) hypertension: Secondary | ICD-10-CM | POA: Diagnosis not present

## 2016-06-24 DIAGNOSIS — K8020 Calculus of gallbladder without cholecystitis without obstruction: Secondary | ICD-10-CM | POA: Diagnosis not present

## 2016-06-24 DIAGNOSIS — Z8249 Family history of ischemic heart disease and other diseases of the circulatory system: Secondary | ICD-10-CM

## 2016-06-24 DIAGNOSIS — E861 Hypovolemia: Secondary | ICD-10-CM | POA: Diagnosis present

## 2016-06-24 LAB — CBC WITH DIFFERENTIAL/PLATELET
BASOS ABS: 0 10*3/uL (ref 0.0–0.1)
BASOS PCT: 0 %
EOS ABS: 0 10*3/uL (ref 0.0–0.7)
Eosinophils Relative: 0 %
HCT: 42.2 % (ref 39.0–52.0)
HEMOGLOBIN: 15.1 g/dL (ref 13.0–17.0)
Lymphocytes Relative: 3 %
Lymphs Abs: 0.5 10*3/uL — ABNORMAL LOW (ref 0.7–4.0)
MCH: 33.1 pg (ref 26.0–34.0)
MCHC: 35.8 g/dL (ref 30.0–36.0)
MCV: 92.5 fL (ref 78.0–100.0)
MONOS PCT: 10 %
Monocytes Absolute: 2 10*3/uL — ABNORMAL HIGH (ref 0.1–1.0)
NEUTROS PCT: 87 %
Neutro Abs: 16.4 10*3/uL — ABNORMAL HIGH (ref 1.7–7.7)
Platelets: 126 10*3/uL — ABNORMAL LOW (ref 150–400)
RBC: 4.56 MIL/uL (ref 4.22–5.81)
RDW: 14.4 % (ref 11.5–15.5)
WBC: 18.9 10*3/uL — ABNORMAL HIGH (ref 4.0–10.5)

## 2016-06-24 LAB — COMPREHENSIVE METABOLIC PANEL
ALBUMIN: 3.3 g/dL — AB (ref 3.5–5.0)
ALK PHOS: 67 U/L (ref 38–126)
ALT: 22 U/L (ref 17–63)
ANION GAP: 10 (ref 5–15)
AST: 27 U/L (ref 15–41)
BUN: 19 mg/dL (ref 6–20)
CO2: 23 mmol/L (ref 22–32)
Calcium: 9.1 mg/dL (ref 8.9–10.3)
Chloride: 95 mmol/L — ABNORMAL LOW (ref 101–111)
Creatinine, Ser: 1.01 mg/dL (ref 0.61–1.24)
GFR calc Af Amer: >60 mL/min (ref >60)
GFR calc non Af Amer: >60 mL/min (ref >60)
GLUCOSE: 163 mg/dL — AB (ref 65–99)
POTASSIUM: 3.8 mmol/L (ref 3.5–5.1)
SODIUM: 128 mmol/L — AB (ref 135–145)
Total Bilirubin: 4.6 mg/dL — ABNORMAL HIGH (ref 0.3–1.2)
Total Protein: 6.4 g/dL — ABNORMAL LOW (ref 6.5–8.1)

## 2016-06-24 LAB — LIPASE, BLOOD: Lipase: 29 U/L (ref 11–51)

## 2016-06-24 MED ORDER — AMPICILLIN-SULBACTAM SODIUM 1.5 (1-0.5) G IJ SOLR
1.5000 g | Freq: Once | INTRAMUSCULAR | Status: AC
  Administered 2016-06-24: 1.5 g via INTRAVENOUS
  Filled 2016-06-24: qty 1.5

## 2016-06-24 MED ORDER — SODIUM CHLORIDE 0.9 % IV BOLUS (SEPSIS)
1000.0000 mL | Freq: Once | INTRAVENOUS | Status: AC
  Administered 2016-06-24: 1000 mL via INTRAVENOUS

## 2016-06-24 MED ORDER — SODIUM CHLORIDE 0.9 % IV SOLN: INTRAVENOUS | Status: DC

## 2016-06-24 MED ORDER — HYDROCODONE-ACETAMINOPHEN 5-325 MG PO TABS: 1.0000 | ORAL_TABLET | Freq: Three times a day (TID) | ORAL | 0 refills | Status: AC | PRN

## 2016-06-24 MED ORDER — HYDROMORPHONE HCL 1 MG/ML IJ SOLN
1.0000 mg | Freq: Once | INTRAMUSCULAR | Status: AC
Start: 2016-06-24 — End: 2016-06-24
  Administered 2016-06-24: 1 mg via INTRAVENOUS
  Filled 2016-06-24: qty 1

## 2016-06-24 MED ORDER — HYDROMORPHONE HCL 1 MG/ML IJ SOLN
1.0000 mg | Freq: Once | INTRAMUSCULAR | Status: AC
  Administered 2016-06-24: 1 mg via INTRAVENOUS
  Filled 2016-06-24: qty 1

## 2016-06-24 NOTE — ED Provider Notes (Signed)
[facility] DEPT Provider Note   CSN: [id number] Arrival date & time: 06/24/16  1656     History   Chief Complaint Chief Complaint  Patient presents with  . Abdominal Pain    HPI Samuel Little is a 83 y.o. male.  The history is provided by the patient, the spouse and medical records.  Abdominal Pain   This is a recurrent problem. The current episode started more than 2 days ago. The problem occurs constantly. The problem has been gradually worsening. The pain is located in the RUQ. The quality of the pain is sharp. The pain is severe. Pertinent negatives include fever, nausea, vomiting, dysuria, hematuria and arthralgias. The symptoms are aggravated by palpation. Nothing relieves the symptoms. Past workup includes ultrasound. His past medical history is significant for GERD.    Past Medical History:  Diagnosis Date  . 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 Arthritis   . Gastroesophageal reflux disease   . Gout   . Hypertension   . Hypothyroidism   . Macular degeneration   . Pleural effusion, left   . Right bundle branch block   . Thyroid disease    hypothyroidism    Patient Active Problem List   Diagnosis Date Noted  . Hyponatremia 06/25/2016  . Prolonged QT interval 06/25/2016  . Acute cholecystitis 06/24/2016  . Diverticulitis of cecum 12/20/2013  . BPH (benign prostatic hyperplasia) 12/20/2013  . Bacteremia due to Escherichia coli 12/19/2013  . Gout 12/18/2013  . Sepsis ([place]) 12/18/2013  . CKD (chronic kidney disease), stage III 12/18/2013  . PSVT 04/23/2009  . Hypothyroidism 04/22/2009  . Essential hypertension 04/22/2009  . GERD 04/22/2009  . Arthropathy 04/22/2009    Past Surgical History:  Procedure Laterality Date  . CARDIAC CATHETERIZATION  2007  . CARDIAC ELECTROPHYSIOLOGY STUDY AND ABLATION    . CATARACT EXTRACTION    . HERNIA REPAIR    . MASS EXCISION  Right 09/22/2015   Procedure: RIGHT THUMB EXCISION MASS;  Surgeon: Leanora Cover, MD;  Location: [facility];  Service: Orthopedics;  Laterality: Right;  . ROTATOR CUFF REPAIR    . TONSILLECTOMY         Home Medications    Prior to Admission medications   Medication Sig Start Date End Date Taking? Authorizing Provider  Acetaminophen (TYLENOL ARTHRITIS PAIN PO) Take 650 mg by mouth daily as needed (pain).    Yes Historical Provider, MD  allopurinol (ZYLOPRIM) 100 MG tablet Take 100 mg by mouth daily.   Yes Historical Provider, MD  Ascorbic Acid (VITAMIN C) 500 MG CAPS Take 500 mg by mouth daily.    Yes Historical Provider, MD  aspirin EC 81 MG tablet Take 81 mg by mouth daily.   Yes Historical Provider, MD  Bilberry, Vaccinium myrtillus, (BILBERRY PO) Take 1 tablet by mouth daily.    Yes Historical Provider, MD  Calcium-Vitamin D (CALTRATE 600 PLUS-VIT D PO) Take 1 tablet by mouth daily.    Yes Historical Provider, MD  Cholecalciferol (VITAMIN D) 2000 UNITS tablet Take 2,000 Units by mouth daily.   Yes Historical Provider, MD  Cobalamine Combinations (VITAMIN B12-FOLIC ACID PO) Take 1 tablet by mouth daily.    Yes Historical Provider, MD  Coenzyme Q10 (CO Q 10 PO) Take 1 tablet by mouth daily.    Yes Historical Provider, MD  colchicine 0.6 MG tablet Take 0.6 mg by mouth daily as needed (  gout).    Yes Historical Provider, MD  CORAL CALCIUM PO Take 2 tablets by mouth daily.   Yes Historical Provider, MD  CRANBERRY PO Take 1 tablet by mouth daily.    Yes Historical Provider, MD  doxazosin (CARDURA) 4 MG tablet Take 4 mg by mouth at bedtime.    Yes Historical Provider, MD  GARLIC PO Take 1 tablet by mouth daily.   Yes Historical Provider, MD  GINKGO BILOBA PO Take 1 tablet by mouth daily.   Yes Historical Provider, MD  Glucos-Chondroit-Hyaluron-MSM (GLUCOSAMINE CHONDROITIN JOINT PO) Take 1 tablet by mouth daily.    Yes Historical Provider, MD  HYDROcodone-acetaminophen  (NORCO/VICODIN) 5-325 MG tablet Take 1-2 tablets by mouth every 8 (eight) hours as needed for severe pain. 06/24/16 06/29/16 Yes Jenny Reichmann, MD  KRILL OIL ULTRA STRENGTH PO Take 1 tablet by mouth daily.    Yes Historical Provider, MD  levothyroxine (SYNTHROID, LEVOTHROID) 175 MCG tablet Take 175 mcg by mouth daily before breakfast.   Yes Historical Provider, MD  lisinopril-hydrochlorothiazide (PRINZIDE,ZESTORETIC) 20-12.5 MG per tablet Take 1 tablet by mouth daily.   Yes Historical Provider, MD  Misc Natural Products (BLACK CHERRY CONCENTRATE PO) Take 1 tablet by mouth daily.    Yes Historical Provider, MD  multivitamin-lutein (OCUVITE-LUTEIN) CAPS capsule Take 2 capsules by mouth 2 (two) times daily.    Yes Historical Provider, MD  omeprazole (PRILOSEC) 20 MG capsule Take 20 mg by mouth daily as needed (acid reflux).    Yes Historical Provider, MD  RESVERATROL PO Take 1 tablet by mouth daily.    Yes Historical Provider, MD  senna (SENOKOT) 8.6 MG TABS tablet Take 1 tablet (8.6 mg total) by mouth at bedtime as needed for mild constipation. 12/20/13  Yes Nishant Dhungel, MD  traMADol (ULTRAM) 50 MG tablet Take 50 mg by mouth every 6 (six) hours as needed for moderate pain.    Yes Historical Provider, MD  TURMERIC PO Take 1 tablet by mouth daily.    Yes Historical Provider, MD    Family History Family History  Problem Relation Age of Onset  . Hypertension Mother   . Diabetes Mother   . Hypertension Father   . Heart attack Father   . Diabetes Father   . Heart attack Brother   . Heart attack Paternal Uncle   . Heart attack Paternal Uncle   . Heart attack Paternal Uncle   . Other Son     Leak in aorta    Social History Social History  Substance Use Topics  . Smoking status: Never Smoker  . Smokeless tobacco: Never Used  . Alcohol use No     Allergies   Celecoxib; Meperidine hcl; and Nsaids   Review of Systems Review of Systems  Constitutional: Negative for chills and fever.    HENT: Negative for ear pain and sore throat.   Eyes: Negative for pain and visual disturbance.  Respiratory: Negative for cough and shortness of breath.   Cardiovascular: Negative for chest pain and palpitations.  Gastrointestinal: Positive for abdominal pain. Negative for nausea and vomiting.  Genitourinary: Negative for dysuria and hematuria.  Musculoskeletal: Negative for arthralgias and back pain.  Skin: Negative for color change and rash.  Neurological: Negative for seizures and syncope.  All other systems reviewed and are negative.    Physical Exam Updated Vital Signs BP (!) 157/72   Pulse 94   Temp 98.8 F (37.1 C) (Oral)   Resp (!) 31   SpO2 94%  Physical Exam  Constitutional: He is oriented to person, place, and time. He appears well-developed and well-nourished.  HENT:  Head: Normocephalic and atraumatic.  Eyes: Conjunctivae are normal.  Neck: Neck supple.  Cardiovascular: Regular rhythm.   No murmur heard. Tachycardic  Pulmonary/Chest: Effort normal and breath sounds normal. No respiratory distress.  Abdominal: Soft. There is tenderness (RUQ). There is guarding.  Much more tender today that he was on yesterday's exam, ventral hernia still soft  Musculoskeletal: He exhibits no edema.  Neurological: He is alert and oriented to person, place, and time.  Skin: Skin is warm and dry.  Psychiatric: He has a normal mood and affect. His behavior is normal.  Nursing note and vitals reviewed.    ED Treatments / Results  Labs (all labs ordered are listed, but only abnormal results are displayed) Labs Reviewed  CBC WITH DIFFERENTIAL/PLATELET - Abnormal; Notable for the following:       Result Value   WBC 18.9 (*)    Platelets 126 (*)    Neutro Abs 16.4 (*)    Lymphs Abs 0.5 (*)    Monocytes Absolute 2.0 (*)    All other components within normal limits  COMPREHENSIVE METABOLIC PANEL - Abnormal; Notable for the following:    Sodium 128 (*)    Chloride 95 (*)     Glucose, Bld 163 (*)    Total Protein 6.4 (*)    Albumin 3.3 (*)    Total Bilirubin 4.6 (*)    All other components within normal limits  LIPASE, BLOOD    EKG  EKG Interpretation None       Radiology US Abdomen Limited  Result Date: 06/24/2016 CLINICAL DATA:  Persistent RIGHT upper quadrant pain and leukocytosis. EXAM: US ABDOMEN LIMITED - RIGHT UPPER QUADRANT COMPARISON:  Abdominal ultrasound June 23, 2016. FINDINGS: Gallbladder: Gallbladder wall thickening at 5 mm. Pericholecystic fluid. Echogenic sludge in a few punctate suspected gallstones. No sonographic Murphy's sign elicited though, there is general RIGHT upper quadrant pain. Common bile duct: Diameter: 5 mm Liver: No focal lesion identified. Within normal limits in parenchymal echogenicity. Small amount of perihepatic ascites. IMPRESSION: Sonographic findings of acute cholecystitis progressed from 1 day prior, even in the absence of Murphy's sign. New small volume perihepatic ascites. Electronically Signed   By: Elon Alas M.D.   On: 06/24/2016 20:52   Dg Chest Portable 1 View  Result Date: 06/24/2016 CLINICAL DATA:  Worsening abdominal pain EXAM: PORTABLE CHEST 1 VIEW COMPARISON:  06/23/2016 gallbladder ultrasound, CT abdomen from 12/18/2013 and CXR 12/18/2013 FINDINGS: Elevated right hemidiaphragm appears to be a chronic finding. There is atelectasis at the lung bases. No pneumonic consolidation, effusion or pneumothorax. There is aortic atherosclerosis with slight uncoiling of the aorta. Borderline cardiomegaly. No pneumothorax. No acute nor suspicious osseous abnormalities. IMPRESSION: Chronic elevation of the right hemidiaphragm with right basilar atelectasis. Aortic atherosclerosis. No acute pneumonic consolidation, CHF nor effusion. Electronically Signed   By: Ashley Royalty M.D.   On: 06/24/2016 19:20   US Abdomen Limited Ruq  Result Date: 06/23/2016 CLINICAL DATA:  Initial evaluation for acute right upper quadrant  pain. EXAM: US ABDOMEN LIMITED - RIGHT UPPER QUADRANT COMPARISON:  Prior CT from 12/18/2013. FINDINGS: Gallbladder: Stones and sludge present within the gallbladder lumen. Largest stone measured approximately 5 mm. No free pericholecystic fluid. No abnormal gallbladder wall thickening. No sonographic Murphy sign elicited on exam. Common bile duct: Diameter: 4.2 mm Liver: No focal lesion identified. Within normal limits in parenchymal echogenicity. IMPRESSION:  1. Stones and sludge within the gallbladder lumen. No other sonographic features to suggest acute cholecystitis. 2. No biliary dilatation. Electronically Signed   By: Jeannine Boga M.D.   On: 06/23/2016 22:33    Procedures Procedures (including critical care time)  Medications Ordered in ED Medications  0.9 %  sodium chloride infusion (not administered)  sodium chloride 0.9 % bolus 1,000 mL (0 mLs Intravenous Stopped 06/24/16 2132)  HYDROmorphone (DILAUDID) injection 1 mg (1 mg Intravenous Given 06/24/16 1736)  ampicillin-sulbactam (UNASYN) 1.5 g in sodium chloride 0.9 % 50 mL IVPB (0 g Intravenous Stopped 06/24/16 2211)  HYDROmorphone (DILAUDID) injection 1 mg (1 mg Intravenous Given 06/24/16 2138)     Initial Impression / Assessment and Plan / ED Course  I have reviewed the triage vital signs and the nursing notes.  Pertinent labs & imaging results that were available during my care of the patient were reviewed by me and considered in my medical decision making (see chart for details).    Pt presents with RUQ pain. Seen here and d/c'd last night after pain was controlled with IV & PO pain medications. Woke up this morning with continued pain, that was not relieved by oral narcotics. The pain worsened over the course of the day, so his wife brought him back in for another evaluation. No F/C, CP, SOB, N/V/D.  VS & exam as above. NS bolus & IV pain medication given. EKG: ST @ 100bpm w/RBBB similar to priors. CXR w/o acute  abnormalities. Labs remarkable for WBC 18.9 (11.4 yesterday), Na 128, Tbili 4.6..   Empiric Unasyn started in the ED.  RUQ US shows progression to acute cholecystitis.  General Surgery consulted and evaluated the Pt in the ED; recommending admission to the medicine while awaiting surgery.  Will admit the Pt to the Hospitalist's service for further evaluation and treatment.  Final Clinical Impressions(s) / ED Diagnoses   Final diagnoses:  Acute cholecystitis    New Prescriptions New Prescriptions   No medications on file     Jenny Reichmann, MD 06/25/16 0008    Courteney Julio Alm, MD 06/26/16 1059

## 2016-06-24 NOTE — ED Notes (Signed)
Patient transported to Ultrasound 

## 2016-06-24 NOTE — ED Notes (Signed)
ED Provider at bedside. 

## 2016-06-24 NOTE — ED Triage Notes (Signed)
Pt returning to ER for worsening abdominal pain. Diagnosed with gall stones yesterday and given prescription pain medication and a follow up with surgery. Pt unable to get relief of pain with medication and states the pain is worse. A/o x4. Received 100 mcg of fentanyl in route.

## 2016-06-24 NOTE — ED Notes (Signed)
surgeon bedside

## 2016-06-24 NOTE — ED Notes (Signed)
Pt placed on 2L O2 due to shallow breathing due to pain, relief given

## 2016-06-24 NOTE — Consult Note (Signed)
Reason for Consult:Cholecystitis Referring Physician: Ellie Lunch, MD  SOHRAB KEELAN is an 83 y.o. male.  HPI:  Pt is an 83 yo M with 3 days of abdominal pain.  He came to ED 4/12 with abdominal pain, but pt became pain free and had no evidence of cholecystitis on imaging.  He returns with recurrent pain.  He states that this time is much more severe.  He describes it as a sharp pain in the middle and upper right abdomen.   He has not had n/v.  He denies chest pain/SOB.  He has not noted any jaundice.  He has previously had nausea/vomiting, but not this time.  He has not had an appetite.  He did not try any over the counter medications to help with the pain.    Past Medical History:  Diagnosis Date  . 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 Arthritis   . Gastroesophageal reflux disease   . Gout   . Hypertension   . Hypothyroidism   . Macular degeneration   . Pleural effusion, left   . Right bundle branch block   . Thyroid disease    hypothyroidism    Past Surgical History:  Procedure Laterality Date  . CARDIAC CATHETERIZATION  2007  . CARDIAC ELECTROPHYSIOLOGY STUDY AND ABLATION    . CATARACT EXTRACTION    . HERNIA REPAIR    . MASS EXCISION Right 09/22/2015   Procedure: RIGHT THUMB EXCISION MASS;  Surgeon: Leanora Cover, MD;  Location: [facility];  Service: Orthopedics;  Laterality: Right;  . ROTATOR CUFF REPAIR    . TONSILLECTOMY      Family History  Problem Relation Age of Onset  . Hypertension Mother   . Diabetes Mother   . Hypertension Father   . Heart attack Father   . Diabetes Father   . Heart attack Brother   . Heart attack Paternal Uncle   . Heart attack Paternal Uncle   . Heart attack Paternal Uncle   . Other Son     Leak in aorta    Social History:  reports that he has never smoked. He has never used smokeless tobacco. He reports that he does not drink alcohol or  use drugs.  Allergies:  Allergies  Allergen Reactions  . Celecoxib Nausea And Vomiting  . Meperidine Hcl     REACTION: Nausea  . Nsaids Other (See Comments)    jaundice    Medications:  Prior to Admission:  Prescriptions Prior to Admission  Medication Sig Dispense Refill Last Dose  . Acetaminophen (TYLENOL ARTHRITIS PAIN PO) Take 650 mg by mouth daily as needed (pain).    unk  . allopurinol (ZYLOPRIM) 100 MG tablet Take 100 mg by mouth daily.   06/24/2016 at Unknown time  . Ascorbic Acid (VITAMIN C) 500 MG CAPS Take 500 mg by mouth daily.    Past Week at Unknown time  . aspirin EC 81 MG tablet Take 81 mg by mouth daily.   06/23/2016 at Unknown time  . Bilberry, Vaccinium myrtillus, (BILBERRY PO) Take 1 tablet by mouth daily.    Past Week at Unknown time  . Calcium-Vitamin D (CALTRATE 600 PLUS-VIT D PO) Take 1 tablet by mouth daily.    Past Week at Unknown time  . Cholecalciferol (VITAMIN D) 2000 UNITS tablet Take 2,000 Units by mouth daily.   Past Week at Unknown time  . Cobalamine Combinations (  VITAMIN B12-FOLIC ACID PO) Take 1 tablet by mouth daily.    Past Week at Unknown time  . Coenzyme Q10 (CO Q 10 PO) Take 1 tablet by mouth daily.    Past Week at Unknown time  . colchicine 0.6 MG tablet Take 0.6 mg by mouth daily as needed (gout).    unk  . CORAL CALCIUM PO Take 2 tablets by mouth daily.   Past Week at Unknown time  . CRANBERRY PO Take 1 tablet by mouth daily.    Past Week at Unknown time  . doxazosin (CARDURA) 4 MG tablet Take 4 mg by mouth at bedtime.    06/23/2016 at Unknown time  . GARLIC PO Take 1 tablet by mouth daily.   Past Week at Unknown time  . GINKGO BILOBA PO Take 1 tablet by mouth daily.   Past Week at Unknown time  . Glucos-Chondroit-Hyaluron-MSM (GLUCOSAMINE CHONDROITIN JOINT PO) Take 1 tablet by mouth daily.    Past Week at Unknown time  . HYDROcodone-acetaminophen (NORCO/VICODIN) 5-325 MG tablet Take 1-2 tablets by mouth every 8 (eight) hours as needed for severe  pain. 30 tablet 0 unk  . KRILL OIL ULTRA STRENGTH PO Take 1 tablet by mouth daily.    Past Week at Unknown time  . levothyroxine (SYNTHROID, LEVOTHROID) 175 MCG tablet Take 175 mcg by mouth daily before breakfast.   06/24/2016 at Unknown time  . lisinopril-hydrochlorothiazide (PRINZIDE,ZESTORETIC) 20-12.5 MG per tablet Take 1 tablet by mouth daily.   06/24/2016 at Unknown time  . Misc Natural Products (BLACK CHERRY CONCENTRATE PO) Take 1 tablet by mouth daily.    Past Week at Unknown time  . multivitamin-lutein (OCUVITE-LUTEIN) CAPS capsule Take 2 capsules by mouth 2 (two) times daily.    Past Week at Unknown time  . omeprazole (PRILOSEC) 20 MG capsule Take 20 mg by mouth daily as needed (acid reflux).    unk  . RESVERATROL PO Take 1 tablet by mouth daily.    Past Week at Unknown time  . senna (SENOKOT) 8.6 MG TABS tablet Take 1 tablet (8.6 mg total) by mouth at bedtime as needed for mild constipation. 10 each 0 unk  . traMADol (ULTRAM) 50 MG tablet Take 50 mg by mouth every 6 (six) hours as needed for moderate pain.    unk  . TURMERIC PO Take 1 tablet by mouth daily.    Past Week at Unknown time    Results for orders placed or performed during the hospital encounter of 06/24/16 (from the past 48 hour(s))  CBC with Differential     Status: Abnormal   Collection Time: 06/24/16  6:15 PM  Result Value Ref Range   WBC 18.9 (H) 4.0 - 10.5 K/uL   RBC 4.56 4.22 - 5.81 MIL/uL   Hemoglobin 15.1 13.0 - 17.0 g/dL   HCT 42.2 39.0 - 52.0 %   MCV 92.5 78.0 - 100.0 fL   MCH 33.1 26.0 - 34.0 pg   MCHC 35.8 30.0 - 36.0 g/dL   RDW 14.4 11.5 - 15.5 %   Platelets 126 (L) 150 - 400 K/uL   Neutrophils Relative % 87 %   Neutro Abs 16.4 (H) 1.7 - 7.7 K/uL   Lymphocytes Relative 3 %   Lymphs Abs 0.5 (L) 0.7 - 4.0 K/uL   Monocytes Relative 10 %   Monocytes Absolute 2.0 (H) 0.1 - 1.0 K/uL   Eosinophils Relative 0 %   Eosinophils Absolute 0.0 0.0 - 0.7 K/uL   Basophils  Relative 0 %   Basophils Absolute 0.0 0.0  - 0.1 K/uL  Comprehensive metabolic panel     Status: Abnormal   Collection Time: 06/24/16  6:15 PM  Result Value Ref Range   Sodium 128 (L) 135 - 145 mmol/L   Potassium 3.8 3.5 - 5.1 mmol/L   Chloride 95 (L) 101 - 111 mmol/L   CO2 23 22 - 32 mmol/L   Glucose, Bld 163 (H) 65 - 99 mg/dL   BUN 19 6 - 20 mg/dL   Creatinine, Ser 1.01 0.61 - 1.24 mg/dL   Calcium 9.1 8.9 - 10.3 mg/dL   Total Protein 6.4 (L) 6.5 - 8.1 g/dL   Albumin 3.3 (L) 3.5 - 5.0 g/dL   AST 27 15 - 41 U/L   ALT 22 17 - 63 U/L   Alkaline Phosphatase 67 38 - 126 U/L   Total Bilirubin 4.6 (H) 0.3 - 1.2 mg/dL   GFR calc non Af Amer >60 >60 mL/min   GFR calc Af Amer >60 >60 mL/min    Comment: (NOTE) The eGFR has been calculated using the CKD EPI equation. This calculation has not been validated in all clinical situations. eGFR's persistently <60 mL/min signify possible Chronic Kidney Disease.    Anion gap 10 5 - 15  Lipase, blood     Status: None   Collection Time: 06/24/16  6:15 PM  Result Value Ref Range   Lipase 29 11 - 51 U/L    US Abdomen Limited  Result Date: 06/24/2016 CLINICAL DATA:  Persistent RIGHT upper quadrant pain and leukocytosis. EXAM: US ABDOMEN LIMITED - RIGHT UPPER QUADRANT COMPARISON:  Abdominal ultrasound June 23, 2016. FINDINGS: Gallbladder: Gallbladder wall thickening at 5 mm. Pericholecystic fluid. Echogenic sludge in a few punctate suspected gallstones. No sonographic Murphy's sign elicited though, there is general RIGHT upper quadrant pain. Common bile duct: Diameter: 5 mm Liver: No focal lesion identified. Within normal limits in parenchymal echogenicity. Small amount of perihepatic ascites. IMPRESSION: Sonographic findings of acute cholecystitis progressed from 1 day prior, even in the absence of Murphy's sign. New small volume perihepatic ascites. Electronically Signed   By: Elon Alas M.D.   On: 06/24/2016 20:52   Dg Chest Portable 1 View  Result Date: 06/24/2016 CLINICAL DATA:   Worsening abdominal pain EXAM: PORTABLE CHEST 1 VIEW COMPARISON:  06/23/2016 gallbladder ultrasound, CT abdomen from 12/18/2013 and CXR 12/18/2013 FINDINGS: Elevated right hemidiaphragm appears to be a chronic finding. There is atelectasis at the lung bases. No pneumonic consolidation, effusion or pneumothorax. There is aortic atherosclerosis with slight uncoiling of the aorta. Borderline cardiomegaly. No pneumothorax. No acute nor suspicious osseous abnormalities. IMPRESSION: Chronic elevation of the right hemidiaphragm with right basilar atelectasis. Aortic atherosclerosis. No acute pneumonic consolidation, CHF nor effusion. Electronically Signed   By: Ashley Royalty M.D.   On: 06/24/2016 19:20   US Abdomen Limited Ruq  Result Date: 06/23/2016 CLINICAL DATA:  Initial evaluation for acute right upper quadrant pain. EXAM: US ABDOMEN LIMITED - RIGHT UPPER QUADRANT COMPARISON:  Prior CT from 12/18/2013. FINDINGS: Gallbladder: Stones and sludge present within the gallbladder lumen. Largest stone measured approximately 5 mm. No free pericholecystic fluid. No abnormal gallbladder wall thickening. No sonographic Murphy sign elicited on exam. Common bile duct: Diameter: 4.2 mm Liver: No focal lesion identified. Within normal limits in parenchymal echogenicity. IMPRESSION: 1. Stones and sludge within the gallbladder lumen. No other sonographic features to suggest acute cholecystitis. 2. No biliary dilatation. Electronically Signed   By: Marland Kitchen  Jeannine Boga M.D.   On: 06/23/2016 22:33    Review of Systems  Constitutional: Negative.   HENT: Negative.   Eyes: Negative.   Respiratory: Negative.   Cardiovascular: Negative.   Gastrointestinal: Positive for abdominal pain and nausea. Negative for constipation and diarrhea.  Genitourinary: Negative.   Musculoskeletal: Negative.   Skin: Negative.   Neurological: Negative.   Endo/Heme/Allergies: Negative.   Psychiatric/Behavioral: Negative.    Blood pressure  139/71, pulse 93, temperature 98.8 F (37.1 C), temperature source Oral, resp. rate (!) 29, SpO2 95 %. Physical Exam  Constitutional: He is oriented to person, place, and time. He appears well-developed and well-nourished. He appears distressed (looks uncomfortable).  HENT:  Head: Normocephalic and atraumatic.  Right Ear: External ear normal.  Left Ear: External ear normal.  Eyes: Conjunctivae are normal. Pupils are equal, round, and reactive to light. Scleral icterus (faint icterus) is present.  Neck: Neck supple. No tracheal deviation present. No thyromegaly present.  Cardiovascular: Normal rate and intact distal pulses.   Respiratory: Effort normal. No respiratory distress. He exhibits no tenderness.  GI: Soft. He exhibits distension (mildly distended). There is tenderness (RUQ tenderness). There is no rebound and no guarding.  Musculoskeletal: Normal range of motion. He exhibits no edema.  Lymphadenopathy:    He has no cervical adenopathy.  Neurological: He is alert and oriented to person, place, and time.  Skin: Skin is warm and dry. No rash noted. He is not diaphoretic. No erythema. No pallor.  Psychiatric: He has a normal mood and affect. His behavior is normal. Judgment and thought content normal.    Assessment/Plan: Acute calculous cholecystitis Hyperbilirubinemia - likely choledocholithiasis Hyponatremia Hypochloremia Thrombocytopenia Hyperglycemia  Pt will need GI consult to consider ERCP for presumed obstructing gallstone. Could have element of Mirizzi's syndrome, however.  They may desire MRCP prior to invasive testing.    Appreciate medicine admit for medical tuning up with electrolyte abnormalities and hyperglycemia.  Pt with known bundle branch block for several months.  Appears to have reasonable systolic function on echo and no ischemic symptoms.  ? Need for any additional cardiac workup.    Once GI consulted and no common duct stones appreciated, pt will need  cholecystectomy.  Briefly discussed surgery with patient and family.    Jonovan Boedecker 06/24/2016, 10:44 PM

## 2016-06-25 ENCOUNTER — Encounter (HOSPITAL_COMMUNITY): Payer: Self-pay | Admitting: Family Medicine

## 2016-06-25 ENCOUNTER — Inpatient Hospital Stay (HOSPITAL_COMMUNITY): Payer: Medicare Other

## 2016-06-25 DIAGNOSIS — R9431 Abnormal electrocardiogram [ECG] [EKG]: Secondary | ICD-10-CM | POA: Diagnosis present

## 2016-06-25 DIAGNOSIS — E871 Hypo-osmolality and hyponatremia: Secondary | ICD-10-CM | POA: Diagnosis present

## 2016-06-25 LAB — COMPREHENSIVE METABOLIC PANEL
ALBUMIN: 2.8 g/dL — AB (ref 3.5–5.0)
ALT: 25 U/L (ref 17–63)
AST: 27 U/L (ref 15–41)
Alkaline Phosphatase: 75 U/L (ref 38–126)
Anion gap: 8 (ref 5–15)
BUN: 18 mg/dL (ref 6–20)
CHLORIDE: 98 mmol/L — AB (ref 101–111)
CO2: 26 mmol/L (ref 22–32)
CREATININE: 1.02 mg/dL (ref 0.61–1.24)
Calcium: 9 mg/dL (ref 8.9–10.3)
GFR calc Af Amer: >60 mL/min (ref >60)
GFR calc non Af Amer: >60 mL/min (ref >60)
Glucose, Bld: 138 mg/dL — ABNORMAL HIGH (ref 65–99)
POTASSIUM: 4.3 mmol/L (ref 3.5–5.1)
SODIUM: 132 mmol/L — AB (ref 135–145)
Total Bilirubin: 4.4 mg/dL — ABNORMAL HIGH (ref 0.3–1.2)
Total Protein: 5.8 g/dL — ABNORMAL LOW (ref 6.5–8.1)

## 2016-06-25 LAB — CBC
HEMATOCRIT: 41.2 % (ref 39.0–52.0)
Hemoglobin: 14.4 g/dL (ref 13.0–17.0)
MCH: 32.7 pg (ref 26.0–34.0)
MCHC: 35 g/dL (ref 30.0–36.0)
MCV: 93.4 fL (ref 78.0–100.0)
PLATELETS: 133 10*3/uL — AB (ref 150–400)
RBC: 4.41 MIL/uL (ref 4.22–5.81)
RDW: 14.6 % (ref 11.5–15.5)
WBC: 19.6 10*3/uL — AB (ref 4.0–10.5)

## 2016-06-25 LAB — SURGICAL PCR SCREEN
MRSA, PCR: NEGATIVE
Staphylococcus aureus: NEGATIVE

## 2016-06-25 LAB — LACTIC ACID, PLASMA
LACTIC ACID, VENOUS: 2.2 mmol/L — AB (ref 0.5–1.9)
Lactic Acid, Venous: 1.8 mmol/L (ref 0.5–1.9)

## 2016-06-25 LAB — PROTIME-INR
INR: 1.57
Prothrombin Time: 18.9 seconds — ABNORMAL HIGH (ref 11.4–15.2)

## 2016-06-25 LAB — SODIUM, URINE, RANDOM

## 2016-06-25 LAB — APTT: APTT: 32 s (ref 24–36)

## 2016-06-25 LAB — OSMOLALITY: Osmolality: 282 mOsm/kg (ref 275–295)

## 2016-06-25 MED ORDER — AMPICILLIN-SULBACTAM SODIUM 3 (2-1) G IJ SOLR
3.0000 g | Freq: Three times a day (TID) | INTRAMUSCULAR | Status: DC
  Administered 2016-06-25 – 2016-06-28 (×9): 3 g via INTRAVENOUS
  Filled 2016-06-25 (×11): qty 3

## 2016-06-25 MED ORDER — DEXTROSE 5 % IV SOLN
2.0000 g | INTRAVENOUS | Status: DC
  Administered 2016-06-25: 2 g via INTRAVENOUS
  Filled 2016-06-25: qty 2

## 2016-06-25 MED ORDER — LEVOTHYROXINE SODIUM 75 MCG PO TABS
175.0000 ug | ORAL_TABLET | Freq: Every day | ORAL | Status: DC
  Administered 2016-06-25 – 2016-06-28 (×4): 175 ug via ORAL
  Filled 2016-06-25 (×4): qty 1

## 2016-06-25 MED ORDER — GADOBENATE DIMEGLUMINE 529 MG/ML IV SOLN
20.0000 mL | Freq: Once | INTRAVENOUS | Status: AC | PRN
Start: 2016-06-25 — End: 2016-06-25
  Administered 2016-06-25: 20 mL via INTRAVENOUS

## 2016-06-25 MED ORDER — SODIUM CHLORIDE 0.9 % IV BOLUS (SEPSIS)
500.0000 mL | Freq: Once | INTRAVENOUS | Status: AC
  Administered 2016-06-25: 500 mL via INTRAVENOUS

## 2016-06-25 MED ORDER — SODIUM CHLORIDE 0.9 % IV SOLN
INTRAVENOUS | Status: DC
  Administered 2016-06-25 – 2016-06-26 (×4): via INTRAVENOUS
  Administered 2016-06-27: 1 mL via INTRAVENOUS

## 2016-06-25 MED ORDER — OXYCODONE HCL 5 MG PO TABS
5.0000 mg | ORAL_TABLET | ORAL | Status: DC | PRN
  Administered 2016-06-25 (×2): 5 mg via ORAL
  Filled 2016-06-25 (×2): qty 1

## 2016-06-25 MED ORDER — LISINOPRIL 20 MG PO TABS
20.0000 mg | ORAL_TABLET | Freq: Every day | ORAL | Status: DC
  Administered 2016-06-25 – 2016-06-28 (×3): 20 mg via ORAL
  Filled 2016-06-25 (×3): qty 1

## 2016-06-25 MED ORDER — HYDROMORPHONE HCL 1 MG/ML IJ SOLN
1.0000 mg | INTRAMUSCULAR | Status: DC | PRN
  Administered 2016-06-25 (×2): 1 mg via INTRAVENOUS
  Filled 2016-06-25 (×2): qty 1

## 2016-06-25 MED ORDER — LORAZEPAM 2 MG/ML IJ SOLN
1.0000 mg | Freq: Once | INTRAMUSCULAR | Status: AC
  Administered 2016-06-25: 1 mg via INTRAVENOUS
  Filled 2016-06-25: qty 1

## 2016-06-25 MED ORDER — ALLOPURINOL 100 MG PO TABS
100.0000 mg | ORAL_TABLET | Freq: Every day | ORAL | Status: DC
  Administered 2016-06-25 – 2016-06-28 (×4): 100 mg via ORAL
  Filled 2016-06-25 (×4): qty 1

## 2016-06-25 MED ORDER — SODIUM CHLORIDE 0.9% FLUSH
3.0000 mL | Freq: Two times a day (BID) | INTRAVENOUS | Status: DC
  Administered 2016-06-25 – 2016-06-27 (×6): 3 mL via INTRAVENOUS

## 2016-06-25 MED ORDER — DOXAZOSIN MESYLATE 4 MG PO TABS
4.0000 mg | ORAL_TABLET | Freq: Every day | ORAL | Status: DC
  Administered 2016-06-25 – 2016-06-27 (×3): 4 mg via ORAL
  Filled 2016-06-25 (×4): qty 1

## 2016-06-25 MED ORDER — ASPIRIN EC 81 MG PO TBEC
81.0000 mg | DELAYED_RELEASE_TABLET | Freq: Every day | ORAL | Status: DC
  Administered 2016-06-25 – 2016-06-28 (×4): 81 mg via ORAL
  Filled 2016-06-25 (×4): qty 1

## 2016-06-25 NOTE — Progress Notes (Addendum)
Patient ID: Samuel Little, male   DOB: [birth year], 83 y.o.   MRN: [id number]                                                                PROGRESS NOTE                                                                                                                                                                                                             Patient Demographics:    Samuel Little, is a 83 y.o. male, DOB - 12/09/1932, PFX:[id number]  Admit date - 06/24/2016   Admitting Physician Edwin Dada, MD  Outpatient Primary MD for the patient is Wenda Low, MD  LOS - 1  Outpatient Specialists:     Chief Complaint  Patient presents with  . Abdominal Pain       Brief Narrative  83 y.o. male with a past medical history significant for HTN, hypothyroidism, AVNRT status post ablation, gout who presents with RUQ pain.  The patient was in his usual state of health until Tuesday night when he developed emesis all night long. The next day he had some right upper quadrant pain that is new, was evaluated at his PCPs office, who recommended expectant management. By Thursday the pain has worsened, was constant, associated with nausea and vomiting and so he came to the ER.  There his TBili was 2.9 and WBC 11.4K, his [place] was interpreted as cholelithiasis and he was discharged with pain medication and Gen Surg follow up for cholethiasis.  Overnight he took several doses of pain medication without relief, and today he felt continued pain, with any movement, belching, or deep breaths, so he came back to the emergency room.  ED course: -Afebrile, heart rate 107, respirations 18-41, blood pressure 141/70, pulse oximetry normal -Na 128, K 3.8, Cr 1.01 (baseline 1.0), WBC 18.9K, Hgb 15.1 -Total bilirubin 4.6 -Chest x-ray showed no focal opacity -Lipase normal -Ultrasound of the right upper quadrant showed 5 mm wall thickening, pericholecystic fluid, progression from one day ago -She was  given Unasyn, 1 L IV fluids, and general surgery were consulted -Gen., surgery recommended hospitalization admission for medical management of cholecystitis    Subjective:    [name] today states that ruq pain improved.  No radiation to right  shoulder today.  Denies cp, palp, sob, n/v, diarrhea, brbpr.    Assessment  & Plan :    Principal Problem:   Acute cholecystitis Active Problems:   Hypothyroidism   Essential hypertension   PSVT   Gout   Hyponatremia   Prolonged QT interval   1. Acute cholecystitis:  Switch to unasyn for better coverage due to persistent leukocytosis (slightly worse) D/c rocephin,  Pt had Unasyn 1 dose in ER. Appreciate surgery input,  d/w GI MRCP, NPO after midnite.  To OR tomorrow   2. Hyponatremia: due to pain  Improving, continue ns iv Check cmp in am  3. Long QT interval:  Prior QTc normal.   Avoid QT prolonging meds IVF and repeat ECG tomrorow Monitor on tele for now  4. Hypothyroidism:  Continue levothyroxine  5. Hypertension:  Holding HCTZ  Continue lisinopril, doxazosin  6. History of AVNRT:  Stable  7. Gout:  Continue allopurinol   DVT prophylaxis: SCDs until surgery  Code Status: FULL  Family Communication: None present  Disposition Plan: Anticipate IV antibiotics and General Surgery consultation. Consults called: General Surgery,Admission status: INPATIENT       Lab Results  Component Value Date   PLT 133 (L) 06/25/2016     Anti-infectives    Start     Dose/Rate Route Frequency Ordered Stop   06/25/16 0600  cefTRIAXone (ROCEPHIN) 2 g in dextrose 5 % 50 mL IVPB     2 g 100 mL/hr over 30 Minutes Intravenous Every 24 hours 06/25/16 0043     06/24/16 1900  ampicillin-sulbactam (UNASYN) 1.5 g in sodium chloride 0.9 % 50 mL IVPB     1.5 g 100 mL/hr over 30 Minutes Intravenous  Once 06/24/16 1848 06/24/16 2211        Objective:   Vitals:   06/24/16 2330 06/25/16 0000 06/25/16 0037 06/25/16  0529  BP: (!) 146/68 (!) 157/72 (!) 138/91 127/70  Pulse: 87 94 92 87  Resp: (!) 30 (!) 31 (!) 24 (!) 21  Temp:   98.6 F (37 C) 98 F (36.7 C)  TempSrc:   Oral Oral  SpO2: 95% 94% 94% 95%  Weight:   95.9 kg (211 lb 6.7 oz)   Height:   6' (1.829 m)     Wt Readings from Last 3 Encounters:  06/25/16 95.9 kg (211 lb 6.7 oz)  09/22/15 93.9 kg (207 lb)  05/21/15 94.3 kg (208 lb)     Intake/Output Summary (Last 24 hours) at 06/25/16 0748 Last data filed at 06/25/16 0548  Gross per 24 hour  Intake          2121.67 ml  Output              225 ml  Net          1896.67 ml     Physical Exam  Awake Alert, Oriented X 3, No new F.N deficits, Normal affect [place].AT,PERRAL Supple Neck,No JVD, No cervical lymphadenopathy appriciated.  Symmetrical Chest wall movement, Good air movement bilaterally, CTAB RRR,No Gallops,Rubs or new Murmurs, No Parasternal Heave +ve B.Sounds, Abd Soft, No tenderness, No organomegaly appriciated, No rebound - guarding or rigidity. No Cyanosis, Clubbing or edema, No new Rash or bruise      Data Review:    CBC  Recent Labs Lab 06/23/16 1834 06/24/16 1815 06/25/16 0603  WBC 11.4* 18.9* 19.6*  HGB 15.2 15.1 14.4  HCT 43.6 42.2 41.2  PLT 136* 126* 133*  MCV 94.0 92.5 93.4  MCH 32.8 33.1 32.7  MCHC 34.9 35.8 35.0  RDW 14.7 14.4 14.6  LYMPHSABS  --  0.5*  --   MONOABS  --  2.0*  --   EOSABS  --  0.0  --   BASOSABS  --  0.0  --     Chemistries   Recent Labs Lab 06/23/16 1834 06/24/16 1815 06/25/16 0603  NA 130* 128* 132*  K 4.0 3.8 4.3  CL 97* 95* 98*  CO2 23 23 26   GLUCOSE 138* 163* 138*  BUN 13 19 18   CREATININE 0.90 1.01 1.02  CALCIUM 9.3 9.1 9.0  AST 28 27 27   ALT 24 22 25   ALKPHOS 65 67 75  BILITOT 2.9* 4.6* 4.4*   ------------------------------------------------------------------------------------------------------------------ No results for input(s): CHOL, HDL, LDLCALC, TRIG, CHOLHDL, LDLDIRECT in the last 72 hours.  Lab  Results  Component Value Date   HGBA1C (H) 10/16/2009    5.9 (NOTE)                                                                       According to the ADA Clinical Practice Recommendations for 2011, when HbA1c is used as a screening test:   >=6.5%   Diagnostic of Diabetes Mellitus           (if abnormal result  is confirmed)  5.7-6.4%   Increased risk of developing Diabetes Mellitus  References:Diagnosis and Classification of Diabetes Mellitus,Diabetes Care,2011,34(Suppl 1):S62-S69 and Standards of Medical Care in         Diabetes - 2011,Diabetes PPIR,5188,41  (Suppl 1):S11-S61.   ------------------------------------------------------------------------------------------------------------------ No results for input(s): TSH, T4TOTAL, T3FREE, THYROIDAB in the last 72 hours.  Invalid input(s): FREET3 ------------------------------------------------------------------------------------------------------------------ No results for input(s): VITAMINB12, FOLATE, FERRITIN, TIBC, IRON, RETICCTPCT in the last 72 hours.  Coagulation profile  Recent Labs Lab 06/25/16 0603  INR 1.57    No results for input(s): DDIMER in the last 72 hours.  Cardiac Enzymes No results for input(s): CKMB, TROPONINI, MYOGLOBIN in the last 168 hours.  Invalid input(s): CK ------------------------------------------------------------------------------------------------------------------ No results found for: BNP  Inpatient Medications  Scheduled Meds: . allopurinol  100 mg Oral Daily  . aspirin EC  81 mg Oral Daily  . cefTRIAXone (ROCEPHIN)  IV  2 g Intravenous Q24H  . doxazosin  4 mg Oral QHS  . levothyroxine  175 mcg Oral QAC breakfast  . lisinopril  20 mg Oral Daily  . sodium chloride flush  3 mL Intravenous Q12H   Continuous Infusions: . sodium chloride 100 mL/hr at 06/25/16 0032   PRN Meds:.HYDROmorphone (DILAUDID) injection, oxyCODONE  Micro Results No results found for this or any previous visit  (from the past 240 hour(s)).  Radiology Reports US Abdomen Limited  Result Date: 06/24/2016 CLINICAL DATA:  Persistent RIGHT upper quadrant pain and leukocytosis. EXAM: US ABDOMEN LIMITED - RIGHT UPPER QUADRANT COMPARISON:  Abdominal ultrasound June 23, 2016. FINDINGS: Gallbladder: Gallbladder wall thickening at 5 mm. Pericholecystic fluid. Echogenic sludge in a few punctate suspected gallstones. No sonographic Murphy's sign elicited though, there is general RIGHT upper quadrant pain. Common bile duct: Diameter: 5 mm Liver: No focal lesion identified. Within normal limits in parenchymal echogenicity. Small amount of perihepatic ascites. IMPRESSION: Sonographic findings of acute cholecystitis progressed from  1 day prior, even in the absence of Murphy's sign. New small volume perihepatic ascites. Electronically Signed   By: Elon Alas M.D.   On: 06/24/2016 20:52   Dg Chest Portable 1 View  Result Date: 06/24/2016 CLINICAL DATA:  Worsening abdominal pain EXAM: PORTABLE CHEST 1 VIEW COMPARISON:  06/23/2016 gallbladder ultrasound, CT abdomen from 12/18/2013 and CXR 12/18/2013 FINDINGS: Elevated right hemidiaphragm appears to be a chronic finding. There is atelectasis at the lung bases. No pneumonic consolidation, effusion or pneumothorax. There is aortic atherosclerosis with slight uncoiling of the aorta. Borderline cardiomegaly. No pneumothorax. No acute nor suspicious osseous abnormalities. IMPRESSION: Chronic elevation of the right hemidiaphragm with right basilar atelectasis. Aortic atherosclerosis. No acute pneumonic consolidation, CHF nor effusion. Electronically Signed   By: Ashley Royalty M.D.   On: 06/24/2016 19:20   US Abdomen Limited Ruq  Result Date: 06/23/2016 CLINICAL DATA:  Initial evaluation for acute right upper quadrant pain. EXAM: US ABDOMEN LIMITED - RIGHT UPPER QUADRANT COMPARISON:  Prior CT from 12/18/2013. FINDINGS: Gallbladder: Stones and sludge present within the gallbladder  lumen. Largest stone measured approximately 5 mm. No free pericholecystic fluid. No abnormal gallbladder wall thickening. No sonographic Murphy sign elicited on exam. Common bile duct: Diameter: 4.2 mm Liver: No focal lesion identified. Within normal limits in parenchymal echogenicity. IMPRESSION: 1. Stones and sludge within the gallbladder lumen. No other sonographic features to suggest acute cholecystitis. 2. No biliary dilatation. Electronically Signed   By: Jeannine Boga M.D.   On: 06/23/2016 22:33    Time Spent in minutes  30   Jani Gravel M.D on 06/25/2016 at 7:48 AM  Between 7am to 7pm - Pager - [phone]  After 7pm go to [email or web address] - password [email or web address]  [facility] -  Office  [phone]

## 2016-06-25 NOTE — Progress Notes (Signed)
Pharmacy Antibiotic Note  Samuel Little is a 83 y.o. male admitted on 06/24/2016 with acute cholecystitis with obstructing gallstone.  Pharmacy has been consulted for Unasyn dosing. Rocephin has been discontinued. Plan for MRCP in place. GI consulted. Likely to go for cholecystectomy Sunday.   WBC is rising at 19.6. LA is down at 1.8.  Fever curve is down. SCr is up at 1.02 with estimated nCrCl ~ 55-87mL/min.   Plan: Unasyn 3g IV q8h.  Monitor renal function, culture results, and clinical status.   Height: 6' (182.9 cm) Weight: 211 lb 6.7 oz (95.9 kg) IBW/kg (Calculated) : 77.6  Temp (24hrs), Avg:98.5 F (36.9 C), Min:98 F (36.7 C), Max:98.8 F (37.1 C)   Recent Labs Lab 06/23/16 1834 06/24/16 1815 06/25/16 0057 06/25/16 0603 06/25/16 0604  WBC 11.4* 18.9*  --  19.6*  --   CREATININE 0.90 1.01  --  1.02  --   LATICACIDVEN  --   --  2.2*  --  1.8    Estimated Creatinine Clearance: 67.1 mL/min (by C-G formula based on SCr of 1.02 mg/dL).    Allergies  Allergen Reactions  . Celecoxib Nausea And Vomiting  . Meperidine Hcl     REACTION: Nausea  . Nsaids Other (See Comments)    jaundice    Antimicrobials this admission: Unasyn 4/13 x1 dose; 4/14 >> Ceftriaxone 4/14 x1 dose.   Dose adjustments this admission:   Microbiology results: 4/14 BCx:   Thank you for allowing pharmacy to be a part of this patient's care.  Sloan Leiter, PharmD, BCPS Clinical Pharmacist Clinical phone 06/25/2016 until 3:30 PM [phone] After hours, please call #[phone] 06/25/2016 11:29 AM

## 2016-06-25 NOTE — Progress Notes (Addendum)
Subjective: Still has pain but better.  Right shoulder pain is resolved.  Hurts when he tries to get up and walk.  Right upper quadrant. No nausea or vomiting  GI has not seen yet but MRCP is ordered.   WBC 19,600.  Hemoglobin 14.4.  ` Total bilirubin 4.4.  Interestingly, AST, ALT, alk phosphatase normal.  Creatinine 1.02.  BUN 18.  Objective: Vital signs in last 24 hours: Temp:  [98 F (36.7 C)-98.8 F (37.1 C)] 98 F (36.7 C) (04/14 0529) Pulse Rate:  [87-107] 87 (04/14 0529) Resp:  [18-41] 21 (04/14 0529) BP: (127-157)/(65-95) 127/70 (04/14 0529) SpO2:  [91 %-96 %] 95 % (04/14 0529) Weight:  [95.9 kg (211 lb 6.7 oz)] 95.9 kg (211 lb 6.7 oz) (04/14 0037) Last BM Date: 06/22/16 (per pt report)  Intake/Output from previous day: 04/13 0701 - 04/14 0700 In: 2121.7 [P.O.:120; I.V.:526.7; IV Piggyback:1475] Out: 225 [Urine:225] Intake/Output this shift: Total I/O In: -  Out: 120 [Urine:120]  General appearance: Alert and cooperative.  Mild distress.  Oriented. Resp: clear to auscultation bilaterally GI: Soft.  Protuberant.  Tender right upper quadrant but no palpable mass.  Tender to percuss costal margin.  Lab Results:   Recent Labs  06/24/16 1815 06/25/16 0603  WBC 18.9* 19.6*  HGB 15.1 14.4  HCT 42.2 41.2  PLT 126* 133*   BMET  Recent Labs  06/24/16 1815 06/25/16 0603  NA 128* 132*  K 3.8 4.3  CL 95* 98*  CO2 23 26  GLUCOSE 163* 138*  BUN 19 18  CREATININE 1.01 1.02  CALCIUM 9.1 9.0   PT/INR  Recent Labs  06/25/16 0603  LABPROT 18.9*  INR 1.57   ABG No results for input(s): PHART, HCO3 in the last 72 hours.  Invalid input(s): PCO2, PO2  Studies/Results: US Abdomen Limited  Result Date: 06/24/2016 CLINICAL DATA:  Persistent RIGHT upper quadrant pain and leukocytosis. EXAM: US ABDOMEN LIMITED - RIGHT UPPER QUADRANT COMPARISON:  Abdominal ultrasound June 23, 2016. FINDINGS: Gallbladder: Gallbladder wall thickening at 5 mm. Pericholecystic  fluid. Echogenic sludge in a few punctate suspected gallstones. No sonographic Murphy's sign elicited though, there is general RIGHT upper quadrant pain. Common bile duct: Diameter: 5 mm Liver: No focal lesion identified. Within normal limits in parenchymal echogenicity. Small amount of perihepatic ascites. IMPRESSION: Sonographic findings of acute cholecystitis progressed from 1 day prior, even in the absence of Murphy's sign. New small volume perihepatic ascites. Electronically Signed   By: Elon Alas M.D.   On: 06/24/2016 20:52   Dg Chest Portable 1 View  Result Date: 06/24/2016 CLINICAL DATA:  Worsening abdominal pain EXAM: PORTABLE CHEST 1 VIEW COMPARISON:  06/23/2016 gallbladder ultrasound, CT abdomen from 12/18/2013 and CXR 12/18/2013 FINDINGS: Elevated right hemidiaphragm appears to be a chronic finding. There is atelectasis at the lung bases. No pneumonic consolidation, effusion or pneumothorax. There is aortic atherosclerosis with slight uncoiling of the aorta. Borderline cardiomegaly. No pneumothorax. No acute nor suspicious osseous abnormalities. IMPRESSION: Chronic elevation of the right hemidiaphragm with right basilar atelectasis. Aortic atherosclerosis. No acute pneumonic consolidation, CHF nor effusion. Electronically Signed   By: Ashley Royalty M.D.   On: 06/24/2016 19:20   US Abdomen Limited Ruq  Result Date: 06/23/2016 CLINICAL DATA:  Initial evaluation for acute right upper quadrant pain. EXAM: US ABDOMEN LIMITED - RIGHT UPPER QUADRANT COMPARISON:  Prior CT from 12/18/2013. FINDINGS: Gallbladder: Stones and sludge present within the gallbladder lumen. Largest stone measured approximately 5 mm. No free pericholecystic fluid. No  abnormal gallbladder wall thickening. No sonographic Murphy sign elicited on exam. Common bile duct: Diameter: 4.2 mm Liver: No focal lesion identified. Within normal limits in parenchymal echogenicity. IMPRESSION: 1. Stones and sludge within the gallbladder  lumen. No other sonographic features to suggest acute cholecystitis. 2. No biliary dilatation. Electronically Signed   By: Jeannine Boga M.D.   On: 06/23/2016 22:33    Anti-infectives: Anti-infectives    Start     Dose/Rate Route Frequency Ordered Stop   06/25/16 0600  cefTRIAXone (ROCEPHIN) 2 g in dextrose 5 % 50 mL IVPB     2 g 100 mL/hr over 30 Minutes Intravenous Every 24 hours 06/25/16 0043     06/24/16 1900  ampicillin-sulbactam (UNASYN) 1.5 g in sodium chloride 0.9 % 50 mL IVPB     1.5 g 100 mL/hr over 30 Minutes Intravenous  Once 06/24/16 1848 06/24/16 2211      Assessment/Plan:  Acute calculus cholecystitis Leukocytosis.  WBC 19,000. Hyperbilirubinemia without elevation of liver enzymes otherwise. This raises the question of nonobstructive cause of jaundice. Mild thrombocytopenia, not prohibitive for surgery however.   Mild lactic acidosis, resolved  Hypertension  History of hypothyroidism  History gout  History endocardial ablation   Plan:     Needs GI consult to consider ERCP for possible obstructing gallstone.  MRCP pending     Continue IV antibiotics.  Seems to be on Unasyn and Rocephin.      We will clearly need cholecystectomy this admission -  possibly tomorrow     Nothing by mouth after midnight     Labs in am.   LOS: 1 day    Usha Slager M 06/25/2016

## 2016-06-25 NOTE — H&P (Signed)
History and Physical  Patient Name: Samuel Little     VOH:[id number]    DOB: Mar 19, 1932    DOA: 06/24/2016 PCP: Wenda Low, MD   Patient coming from: Home  Chief Complaint: RUQ Pain  HPI: Samuel Little is a 83 y.o. male with a past medical history significant for HTN, hypothyroidism, AVNRT status post ablation, gout who presents with RUQ pain.  The patient was in his usual state of health until Tuesday night when he developed emesis all night long. The next day he had some right upper quadrant pain that is new, was evaluated at his PCPs office, who recommended expectant management. By Thursday the pain has worsened, was constant, associated with nausea and vomiting and so he came to the ER.  There his TBili was 2.9 and WBC 11.4K, his [place] was interpreted as cholelithiasis and he was discharged with pain medication and Gen Surg follow up for cholethiasis.  Overnight he took several doses of pain medication without relief, and today he felt continued pain, with any movement, belching, or deep breaths, so he came back to the emergency room.  ED course: -Afebrile, heart rate 107, respirations 18-41, blood pressure 141/70, pulse oximetry normal -Na 128, K 3.8, Cr 1.01 (baseline 1.0), WBC 18.9K, Hgb 15.1 -Total bilirubin 4.6 -Chest x-ray showed no focal opacity -Lipase normal -Ultrasound of the right upper quadrant showed 5 mm wall thickening, pericholecystic fluid, progression from one day ago -She was given Unasyn, 1 L IV fluids, and general surgery were consulted -Gen., surgery recommended hospitalization admission for medical management of cholecystitis      ROS: Review of Systems  Constitutional: Negative for chills and fever.  Cardiovascular: Negative for chest pain.  Gastrointestinal: Positive for abdominal pain and nausea.  Musculoskeletal: Positive for joint pain (right scapula).  All other systems reviewed and are negative.         Past Medical History:  Diagnosis Date    . 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 Arthritis   . Gastroesophageal reflux disease   . Gout   . Hypertension   . Hypothyroidism   . Macular degeneration   . Pleural effusion, left   . Right bundle branch block   . Thyroid disease    hypothyroidism    Past Surgical History:  Procedure Laterality Date  . CARDIAC CATHETERIZATION  2007  . CARDIAC ELECTROPHYSIOLOGY STUDY AND ABLATION    . CATARACT EXTRACTION    . HERNIA REPAIR    . MASS EXCISION Right 09/22/2015   Procedure: RIGHT THUMB EXCISION MASS;  Surgeon: Leanora Cover, MD;  Location: [facility];  Service: Orthopedics;  Laterality: Right;  . ROTATOR CUFF REPAIR    . TONSILLECTOMY      Social History: Patient lives with his wife.  The patient walks unassisted.  Still drives.  From [place] originally.  Retired from [employer].  Doesn't drink or smoke.    Allergies  Allergen Reactions  . Celecoxib Nausea And Vomiting  . Meperidine Hcl     REACTION: Nausea  . Nsaids Other (See Comments)    jaundice    Family history: family history includes Diabetes in his father and mother; Heart attack in his brother, father, paternal uncle, paternal uncle, and paternal uncle; Hypertension in his father and mother; Other in his son.  Prior to Admission medications   Medication Sig Start Date End Date Taking? Authorizing Provider  Acetaminophen (  TYLENOL ARTHRITIS PAIN PO) Take 650 mg by mouth daily as needed (pain).    Yes Historical Provider, MD  allopurinol (ZYLOPRIM) 100 MG tablet Take 100 mg by mouth daily.   Yes Historical Provider, MD  Ascorbic Acid (VITAMIN C) 500 MG CAPS Take 500 mg by mouth daily.    Yes Historical Provider, MD  aspirin EC 81 MG tablet Take 81 mg by mouth daily.   Yes Historical Provider, MD  Bilberry, Vaccinium myrtillus, (BILBERRY PO) Take 1 tablet by mouth daily.    Yes Historical Provider, MD   Calcium-Vitamin D (CALTRATE 600 PLUS-VIT D PO) Take 1 tablet by mouth daily.    Yes Historical Provider, MD  Cholecalciferol (VITAMIN D) 2000 UNITS tablet Take 2,000 Units by mouth daily.   Yes Historical Provider, MD  Cobalamine Combinations (VITAMIN B12-FOLIC ACID PO) Take 1 tablet by mouth daily.    Yes Historical Provider, MD  Coenzyme Q10 (CO Q 10 PO) Take 1 tablet by mouth daily.    Yes Historical Provider, MD  colchicine 0.6 MG tablet Take 0.6 mg by mouth daily as needed (gout).    Yes Historical Provider, MD  CORAL CALCIUM PO Take 2 tablets by mouth daily.   Yes Historical Provider, MD  CRANBERRY PO Take 1 tablet by mouth daily.    Yes Historical Provider, MD  doxazosin (CARDURA) 4 MG tablet Take 4 mg by mouth at bedtime.    Yes Historical Provider, MD  GARLIC PO Take 1 tablet by mouth daily.   Yes Historical Provider, MD  GINKGO BILOBA PO Take 1 tablet by mouth daily.   Yes Historical Provider, MD  Glucos-Chondroit-Hyaluron-MSM (GLUCOSAMINE CHONDROITIN JOINT PO) Take 1 tablet by mouth daily.    Yes Historical Provider, MD  HYDROcodone-acetaminophen (NORCO/VICODIN) 5-325 MG tablet Take 1-2 tablets by mouth every 8 (eight) hours as needed for severe pain. 06/24/16 06/29/16 Yes Jenny Reichmann, MD  KRILL OIL ULTRA STRENGTH PO Take 1 tablet by mouth daily.    Yes Historical Provider, MD  levothyroxine (SYNTHROID, LEVOTHROID) 175 MCG tablet Take 175 mcg by mouth daily before breakfast.   Yes Historical Provider, MD  lisinopril-hydrochlorothiazide (PRINZIDE,ZESTORETIC) 20-12.5 MG per tablet Take 1 tablet by mouth daily.   Yes Historical Provider, MD  Misc Natural Products (BLACK CHERRY CONCENTRATE PO) Take 1 tablet by mouth daily.    Yes Historical Provider, MD  multivitamin-lutein (OCUVITE-LUTEIN) CAPS capsule Take 2 capsules by mouth 2 (two) times daily.    Yes Historical Provider, MD  omeprazole (PRILOSEC) 20 MG capsule Take 20 mg by mouth daily as needed (acid reflux).    Yes Historical  Provider, MD  RESVERATROL PO Take 1 tablet by mouth daily.    Yes Historical Provider, MD  senna (SENOKOT) 8.6 MG TABS tablet Take 1 tablet (8.6 mg total) by mouth at bedtime as needed for mild constipation. 12/20/13  Yes Nishant Dhungel, MD  traMADol (ULTRAM) 50 MG tablet Take 50 mg by mouth every 6 (six) hours as needed for moderate pain.    Yes Historical Provider, MD  TURMERIC PO Take 1 tablet by mouth daily.    Yes Historical Provider, MD       Physical Exam: BP (!) 157/72   Pulse 94   Temp 98.8 F (37.1 C) (Oral)   Resp (!) 31   SpO2 94%  General appearance: Well-developed, elderly adult male, alert and in mild distress from pain.   Eyes: Anicteric, conjunctiva pink, lids and lashes normal. PERRL.    ENT: No  nasal deformity, discharge, epistaxis.  Hearing normal. OP with dry MM without lesions.   Neck: No neck masses.  Trachea midline.  No thyromegaly/tenderness. Lymph: No cervical or supraclavicular lymphadenopathy. Skin: Warm and dry.  No jaundice.  No suspicious rashes or lesions. Cardiac: Tachycardic, ergular, nl S1-S2, no murmurs appreciated.  Capillary refill is brisk.  JVP not visible.  No LE edema.  Radial and DP pulses 2+ and symmetric. Respiratory: Normal respiratory rate and rhythm.  CTAB without rales or wheezes. Abdomen: Abdomen soft.  Marked right sided TTP with involuntary guarding, no reboudn or rigidity. No ascites, hepatosplenomegaly.  Siomewhat distended. MSK: No deformities or effusions.  No cyanosis or clubbing. Neuro: Cranial nerves normal.  Sensation intact to light touch. Speech is fluent.  Muscle strength 5/5 and equal.  FTN normal.  Romberg normal.    Psych: Sensorium intact and responding to questions, attention normal.  Behavior appropriate.  Affect normal.  Judgment and insight appear normal.     Labs on Admission:  I have personally reviewed following labs and imaging studies: CBC:  Recent Labs Lab 06/23/16 1834 06/24/16 1815  WBC 11.4* 18.9*   NEUTROABS  --  16.4*  HGB 15.2 15.1  HCT 43.6 42.2  MCV 94.0 92.5  PLT 136* 676*   Basic Metabolic Panel:  Recent Labs Lab 06/23/16 1834 06/24/16 1815  NA 130* 128*  K 4.0 3.8  CL 97* 95*  CO2 23 23  GLUCOSE 138* 163*  BUN 13 19  CREATININE 0.90 1.01  CALCIUM 9.3 9.1   GFR: CrCl cannot be calculated (Unknown ideal weight.).  Liver Function Tests:  Recent Labs Lab 06/23/16 1834 06/24/16 1815  AST 28 27  ALT 24 22  ALKPHOS 65 67  BILITOT 2.9* 4.6*  PROT 6.6 6.4*  ALBUMIN 3.8 3.3*    Recent Labs Lab 06/23/16 1834 06/24/16 1815  LIPASE 25 29   No results for input(s): AMMONIA in the last 168 hours. Coagulation Profile: No results for input(s): INR, PROTIME in the last 168 hours. Cardiac Enzymes: No results for input(s): CKTOTAL, CKMB, CKMBINDEX, TROPONINI in the last 168 hours. BNP (last 3 results) No results for input(s): PROBNP in the last 8760 hours. HbA1C: No results for input(s): HGBA1C in the last 72 hours. CBG: No results for input(s): GLUCAP in the last 168 hours. Lipid Profile: No results for input(s): CHOL, HDL, LDLCALC, TRIG, CHOLHDL, LDLDIRECT in the last 72 hours. Thyroid Function Tests: No results for input(s): TSH, T4TOTAL, FREET4, T3FREE, THYROIDAB in the last 72 hours. Anemia Panel: No results for input(s): VITAMINB12, FOLATE, FERRITIN, TIBC, IRON, RETICCTPCT in the last 72 hours. Sepsis Labs: Lactate pending Invalid input(s): PROCALCITONIN, LACTICIDVEN No results found for this or any previous visit (from the past 240 hour(s)).       Radiological Exams on Admission: Personally reviewed CXR shows no focal opacity; RUQ [place] report reviewed: US Abdomen Limited  Result Date: 06/24/2016 CLINICAL DATA:  Persistent RIGHT upper quadrant pain and leukocytosis. EXAM: US ABDOMEN LIMITED - RIGHT UPPER QUADRANT COMPARISON:  Abdominal ultrasound June 23, 2016. FINDINGS: Gallbladder: Gallbladder wall thickening at 5 mm. Pericholecystic fluid.  Echogenic sludge in a few punctate suspected gallstones. No sonographic Murphy's sign elicited though, there is general RIGHT upper quadrant pain. Common bile duct: Diameter: 5 mm Liver: No focal lesion identified. Within normal limits in parenchymal echogenicity. Small amount of perihepatic ascites. IMPRESSION: Sonographic findings of acute cholecystitis progressed from 1 day prior, even in the absence of Murphy's sign. New small volume  perihepatic ascites. Electronically Signed   By: Elon Alas M.D.   On: 06/24/2016 20:52   Dg Chest Portable 1 View  Result Date: 06/24/2016 CLINICAL DATA:  Worsening abdominal pain EXAM: PORTABLE CHEST 1 VIEW COMPARISON:  06/23/2016 gallbladder ultrasound, CT abdomen from 12/18/2013 and CXR 12/18/2013 FINDINGS: Elevated right hemidiaphragm appears to be a chronic finding. There is atelectasis at the lung bases. No pneumonic consolidation, effusion or pneumothorax. There is aortic atherosclerosis with slight uncoiling of the aorta. Borderline cardiomegaly. No pneumothorax. No acute nor suspicious osseous abnormalities. IMPRESSION: Chronic elevation of the right hemidiaphragm with right basilar atelectasis. Aortic atherosclerosis. No acute pneumonic consolidation, CHF nor effusion. Electronically Signed   By: Ashley Royalty M.D.   On: 06/24/2016 19:20   US Abdomen Limited Ruq  Result Date: 06/23/2016 CLINICAL DATA:  Initial evaluation for acute right upper quadrant pain. EXAM: US ABDOMEN LIMITED - RIGHT UPPER QUADRANT COMPARISON:  Prior CT from 12/18/2013. FINDINGS: Gallbladder: Stones and sludge present within the gallbladder lumen. Largest stone measured approximately 5 mm. No free pericholecystic fluid. No abnormal gallbladder wall thickening. No sonographic Murphy sign elicited on exam. Common bile duct: Diameter: 4.2 mm Liver: No focal lesion identified. Within normal limits in parenchymal echogenicity. IMPRESSION: 1. Stones and sludge within the gallbladder lumen. No  other sonographic features to suggest acute cholecystitis. 2. No biliary dilatation. Electronically Signed   By: Jeannine Boga M.D.   On: 06/23/2016 22:33    EKG: Independently reviewed. Rate 100, RBBB old, prolonged QTc, new.   No ST changes.  Echocardiogram 2011: Normal EF Mild MR    Assessment/Plan  1. Acute cholecystitis:  History, LFts and [place] confirm cholecystitis.  Prior history of diverticulitis, but no LLQ pain. -Obtain lactate, blood culture -Ceftriaxone IV -Clears only -MIVF -Hydromorphone for pain control, avoid ondansetron/phenergan given QTc -Consult to General Surgery, appreciate attention   2. Hyponatremia:  Probably hypovolemic given urine color in room. -IVF -Hold HCT for now -Check urine sodium, osmolality ratios -Repeat BMP tomrorow  3. Long QT interval:  Prior QTc normal.   -Avoid QT prolonging meds -IVF and repeat ECG tomrorow -Monitor on tele for now  4. Hypothyroidism:  -Continue levothyroxine  5. Hypertension:  -Hold HCTZ  -Continue lisinopril, doxazosin  6. History of AVNRT:  Stable  7. Gout:  -Continue allopurinol     DVT prophylaxis: SCDs until surgery  Code Status: FULL  Family Communication: NOne present  Disposition Plan: Anticipate IV antibiotics and General Surgery consultation. Consults called: General Surgery, Dr. Barry Dienes has seen the patient, recommendations are pending Admission status: INPATIENT          Medical decision making: Patient seen at 11:40 PM on 06/24/2016.  The patient was discussed with Dr. Ericka Pontiff.  What exists of the patient's chart was reviewed in depth and summarized above.  Clinical condition: stable hemodynamically for floor.        Edwin Dada [facility] Pager [phone]      At the time of admission, it appears that the appropriate admission status for this patient is INPATIENT. This is judged to be reasonable and necessary in order to provide the required  intensity of service to ensure the patient's safety given the presenting symptoms, physical exam findings, and initial radiographic and laboratory data in the context of their chronic comorbidities.  Together, these circumstances are felt to place him at high risk for further clinical deterioration threatening life, limb, or organ.   Patient requires inpatient status due to high intensity  of service, high risk for further deterioration and high frequency of surveillance required because of this acute illness that poses a threat to life, limb or bodily function.  I certify that at the point of admission it is my clinical judgment that the patient will require inpatient hospital care spanning beyond 2 midnights from the point of admission and that early discharge would result in unnecessary risk of decompensation and readmission or threat to life, limb or bodily function.

## 2016-06-25 NOTE — Progress Notes (Signed)
Text paged MD Hal Hope: "[id number] [place]: pt lactic acid 2.2. " awaiting further instruction.

## 2016-06-25 NOTE — Progress Notes (Signed)
Pharmacy Antibiotic Note  Samuel Little is a 83 y.o. male admitted on 06/24/2016 with intra-abdominal infection.  Pharmacy has been consulted for Ceftriaxone dosing. WBC elevated. Surgery consulted.   Plan: -Ceftriaxone 2g IV q24h -Trend WBC, temp -F/U infectious work-up  Temp (24hrs), Avg:98.7 F (37.1 C), Min:98.6 F (37 C), Max:98.8 F (37.1 C)   Recent Labs Lab 06/23/16 1834 06/24/16 1815  WBC 11.4* 18.9*  CREATININE 0.90 1.01    CrCl cannot be calculated (Unknown ideal weight.).    Allergies  Allergen Reactions  . Celecoxib Nausea And Vomiting  . Meperidine Hcl     REACTION: Nausea  . Nsaids Other (See Comments)    jaundice    Narda Bonds 06/25/2016 12:45 AM

## 2016-06-25 NOTE — ED Notes (Signed)
Admitting MD bedside

## 2016-06-26 ENCOUNTER — Encounter (HOSPITAL_COMMUNITY): Payer: Self-pay | Admitting: Certified Registered Nurse Anesthetist

## 2016-06-26 ENCOUNTER — Inpatient Hospital Stay (HOSPITAL_COMMUNITY): Payer: Medicare Other | Admitting: Anesthesiology

## 2016-06-26 ENCOUNTER — Encounter (HOSPITAL_COMMUNITY)
Admission: EM | Disposition: A | Discharge status: Home Health | Payer: Self-pay | Source: Home / Self Care | Attending: Internal Medicine

## 2016-06-26 HISTORY — PX: CHOLECYSTECTOMY: SHX55

## 2016-06-26 LAB — CBC
HCT: 39.2 % (ref 39.0–52.0)
HEMOGLOBIN: 13.7 g/dL (ref 13.0–17.0)
MCH: 32.7 pg (ref 26.0–34.0)
MCHC: 34.9 g/dL (ref 30.0–36.0)
MCV: 93.6 fL (ref 78.0–100.0)
Platelets: 200 10*3/uL (ref 150–400)
RBC: 4.19 MIL/uL — ABNORMAL LOW (ref 4.22–5.81)
RDW: 15.2 % (ref 11.5–15.5)
WBC: 22.5 10*3/uL — AB (ref 4.0–10.5)

## 2016-06-26 LAB — BASIC METABOLIC PANEL
ANION GAP: 6 (ref 5–15)
BUN: 27 mg/dL — ABNORMAL HIGH (ref 6–20)
CALCIUM: 8.5 mg/dL — AB (ref 8.9–10.3)
CHLORIDE: 101 mmol/L (ref 101–111)
CO2: 25 mmol/L (ref 22–32)
CREATININE: 1 mg/dL (ref 0.61–1.24)
GFR calc Af Amer: >60 mL/min (ref >60)
GFR calc non Af Amer: >60 mL/min (ref >60)
Glucose, Bld: 123 mg/dL — ABNORMAL HIGH (ref 65–99)
Potassium: 4 mmol/L (ref 3.5–5.1)
SODIUM: 132 mmol/L — AB (ref 135–145)

## 2016-06-26 LAB — PROTIME-INR
INR: 1.65
Prothrombin Time: 19.7 seconds — ABNORMAL HIGH (ref 11.4–15.2)

## 2016-06-26 SURGERY — LAPAROSCOPIC CHOLECYSTECTOMY
Anesthesia: General | Site: Abdomen

## 2016-06-26 MED ORDER — LIDOCAINE 2% (20 MG/ML) 5 ML SYRINGE
INTRAMUSCULAR | Status: AC
  Filled 2016-06-26: qty 5

## 2016-06-26 MED ORDER — SODIUM CHLORIDE 0.9 % IR SOLN
Status: DC | PRN
  Administered 2016-06-26: 1000 mL

## 2016-06-26 MED ORDER — CHLORHEXIDINE GLUCONATE CLOTH 2 % EX PADS
6.0000 | MEDICATED_PAD | Freq: Once | CUTANEOUS | Status: AC
  Administered 2016-06-26: 6 via TOPICAL

## 2016-06-26 MED ORDER — PROPOFOL 10 MG/ML IV BOLUS
INTRAVENOUS | Status: DC | PRN
  Administered 2016-06-26: 100 mg via INTRAVENOUS

## 2016-06-26 MED ORDER — SUCCINYLCHOLINE CHLORIDE 200 MG/10ML IV SOSY
PREFILLED_SYRINGE | INTRAVENOUS | Status: DC | PRN
  Administered 2016-06-26: 120 mg via INTRAVENOUS

## 2016-06-26 MED ORDER — EPHEDRINE 5 MG/ML INJ
INTRAVENOUS | Status: AC
  Filled 2016-06-26: qty 10

## 2016-06-26 MED ORDER — FENTANYL CITRATE (PF) 100 MCG/2ML IJ SOLN
INTRAMUSCULAR | Status: DC | PRN
Start: 2016-06-26 — End: 2016-06-26
  Administered 2016-06-26: 25 ug via INTRAVENOUS
  Administered 2016-06-26: 100 ug via INTRAVENOUS
  Administered 2016-06-26: 150 ug via INTRAVENOUS

## 2016-06-26 MED ORDER — PROPOFOL 10 MG/ML IV BOLUS
INTRAVENOUS | Status: AC
  Filled 2016-06-26: qty 20

## 2016-06-26 MED ORDER — LIDOCAINE 2% (20 MG/ML) 5 ML SYRINGE
INTRAMUSCULAR | Status: DC | PRN
  Administered 2016-06-26: 100 mg via INTRAVENOUS

## 2016-06-26 MED ORDER — HYDROCODONE-ACETAMINOPHEN 5-325 MG PO TABS
1.0000 | ORAL_TABLET | ORAL | Status: DC | PRN
  Administered 2016-06-26: 2 via ORAL
  Administered 2016-06-27 (×2): 1 via ORAL
  Filled 2016-06-26: qty 2
  Filled 2016-06-26 (×2): qty 1

## 2016-06-26 MED ORDER — SODIUM CHLORIDE 0.9 % IV SOLN
INTRAVENOUS | Status: DC | PRN
  Administered 2016-06-26: 10:00:00 via INTRAVENOUS

## 2016-06-26 MED ORDER — FENTANYL CITRATE (PF) 250 MCG/5ML IJ SOLN
INTRAMUSCULAR | Status: AC
  Filled 2016-06-26: qty 5

## 2016-06-26 MED ORDER — 0.9 % SODIUM CHLORIDE (POUR BTL) OPTIME
TOPICAL | Status: DC | PRN
  Administered 2016-06-26: 1000 mL

## 2016-06-26 MED ORDER — ONDANSETRON HCL 4 MG/2ML IJ SOLN
INTRAMUSCULAR | Status: DC | PRN
  Administered 2016-06-26: 4 mg via INTRAVENOUS

## 2016-06-26 MED ORDER — PHENYLEPHRINE 40 MCG/ML (10ML) SYRINGE FOR IV PUSH (FOR BLOOD PRESSURE SUPPORT)
PREFILLED_SYRINGE | INTRAVENOUS | Status: AC
  Filled 2016-06-26: qty 10

## 2016-06-26 MED ORDER — SUGAMMADEX SODIUM 200 MG/2ML IV SOLN
INTRAVENOUS | Status: DC | PRN
  Administered 2016-06-26: 200 mg via INTRAVENOUS

## 2016-06-26 MED ORDER — PHENYLEPHRINE 40 MCG/ML (10ML) SYRINGE FOR IV PUSH (FOR BLOOD PRESSURE SUPPORT)
PREFILLED_SYRINGE | INTRAVENOUS | Status: DC | PRN
  Administered 2016-06-26 (×2): 80 ug via INTRAVENOUS
  Administered 2016-06-26 (×2): 120 ug via INTRAVENOUS

## 2016-06-26 MED ORDER — PHENYLEPHRINE HCL 10 MG/ML IJ SOLN
INTRAVENOUS | Status: DC | PRN
  Administered 2016-06-26: 50 ug/min via INTRAVENOUS

## 2016-06-26 MED ORDER — ONDANSETRON HCL 4 MG/2ML IJ SOLN
INTRAMUSCULAR | Status: AC
  Filled 2016-06-26: qty 2

## 2016-06-26 MED ORDER — LACTATED RINGERS IV SOLN
INTRAVENOUS | Status: DC | PRN
  Administered 2016-06-26 (×2): via INTRAVENOUS

## 2016-06-26 MED ORDER — ROCURONIUM BROMIDE 50 MG/5ML IV SOSY
PREFILLED_SYRINGE | INTRAVENOUS | Status: AC
  Filled 2016-06-26: qty 5

## 2016-06-26 MED ORDER — SUGAMMADEX SODIUM 200 MG/2ML IV SOLN
INTRAVENOUS | Status: AC
  Filled 2016-06-26: qty 2

## 2016-06-26 MED ORDER — EPHEDRINE SULFATE-NACL 50-0.9 MG/10ML-% IV SOSY
PREFILLED_SYRINGE | INTRAVENOUS | Status: DC | PRN
  Administered 2016-06-26: 10 mg via INTRAVENOUS

## 2016-06-26 MED ORDER — BUPIVACAINE HCL 0.25 % IJ SOLN
INTRAMUSCULAR | Status: DC | PRN
  Administered 2016-06-26: 10 mL

## 2016-06-26 MED ORDER — SUCCINYLCHOLINE CHLORIDE 200 MG/10ML IV SOSY
PREFILLED_SYRINGE | INTRAVENOUS | Status: AC
  Filled 2016-06-26: qty 10

## 2016-06-26 MED ORDER — ROCURONIUM BROMIDE 10 MG/ML (PF) SYRINGE
PREFILLED_SYRINGE | INTRAVENOUS | Status: DC | PRN
  Administered 2016-06-26: 20 mg via INTRAVENOUS
  Administered 2016-06-26: 30 mg via INTRAVENOUS

## 2016-06-26 SURGICAL SUPPLY — 47 items
APPLIER CLIP ROT 10 11.4 M/L (STAPLE) ×4
BLADE CLIPPER SURG (BLADE) IMPLANT
CANISTER SUCT 3000ML PPV (MISCELLANEOUS) ×4 IMPLANT
CHLORAPREP W/TINT 26ML (MISCELLANEOUS) ×4 IMPLANT
CLIP APPLIE ROT 10 11.4 M/L (STAPLE) ×2 IMPLANT
COVER MAYO STAND STRL (DRAPES) ×4 IMPLANT
COVER SURGICAL LIGHT HANDLE (MISCELLANEOUS) ×4 IMPLANT
DERMABOND ADVANCED (GAUZE/BANDAGES/DRESSINGS) ×2
DERMABOND ADVANCED .7 DNX12 (GAUZE/BANDAGES/DRESSINGS) ×2 IMPLANT
DRAIN CHANNEL 19F RND (DRAIN) ×4 IMPLANT
DRAPE C-ARM 42X72 X-RAY (DRAPES) ×4 IMPLANT
DRAPE WARM FLUID 44X44 (DRAPE) ×4 IMPLANT
ELECT REM PT RETURN 9FT ADLT (ELECTROSURGICAL) ×4
ELECTRODE REM PT RTRN 9FT ADLT (ELECTROSURGICAL) ×2 IMPLANT
ENDOLOOP SUT PDS II  0 18 (SUTURE) ×2
ENDOLOOP SUT PDS II 0 18 (SUTURE) ×2 IMPLANT
EVACUATOR SILICONE 100CC (DRAIN) ×4 IMPLANT
FILTER SMOKE EVAC LAPAROSHD (FILTER) IMPLANT
GLOVE BIO SURGEON STRL SZ 6 (GLOVE) ×4 IMPLANT
GLOVE BIOGEL PI IND STRL 6.5 (GLOVE) ×2 IMPLANT
GLOVE BIOGEL PI INDICATOR 6.5 (GLOVE) ×2
GOWN STRL REUS W/ TWL LRG LVL3 (GOWN DISPOSABLE) ×4 IMPLANT
GOWN STRL REUS W/TWL 2XL LVL3 (GOWN DISPOSABLE) ×4 IMPLANT
GOWN STRL REUS W/TWL LRG LVL3 (GOWN DISPOSABLE) ×4
KIT BASIN OR (CUSTOM PROCEDURE TRAY) ×4 IMPLANT
KIT ROOM TURNOVER OR (KITS) ×4 IMPLANT
L-HOOK LAP DISP 36CM (ELECTROSURGICAL) ×4
LHOOK LAP DISP 36CM (ELECTROSURGICAL) ×2 IMPLANT
NS IRRIG 1000ML POUR BTL (IV SOLUTION) ×4 IMPLANT
PAD ARMBOARD 7.5X6 YLW CONV (MISCELLANEOUS) ×4 IMPLANT
PENCIL BUTTON HOLSTER BLD 10FT (ELECTRODE) ×4 IMPLANT
POUCH SPECIMEN RETRIEVAL 10MM (ENDOMECHANICALS) ×4 IMPLANT
SCISSORS LAP 5X35 DISP (ENDOMECHANICALS) ×4 IMPLANT
SET CHOLANGIOGRAPH 5 50 .035 (SET/KITS/TRAYS/PACK) ×4 IMPLANT
SET IRRIG TUBING LAPAROSCOPIC (IRRIGATION / IRRIGATOR) ×4 IMPLANT
SLEEVE ENDOPATH XCEL 5M (ENDOMECHANICALS) ×4 IMPLANT
SPECIMEN JAR SMALL (MISCELLANEOUS) ×4 IMPLANT
SURGICEL SNOW 4INWX4INL (HEMOSTASIS) ×4 IMPLANT
SUT ETHILON 2 0 FS 18 (SUTURE) ×4 IMPLANT
SUT MNCRL AB 4-0 PS2 18 (SUTURE) ×4 IMPLANT
TOWEL OR 17X24 6PK STRL BLUE (TOWEL DISPOSABLE) ×4 IMPLANT
TOWEL OR 17X26 10 PK STRL BLUE (TOWEL DISPOSABLE) ×4 IMPLANT
TRAY LAPAROSCOPIC MC (CUSTOM PROCEDURE TRAY) ×4 IMPLANT
TROCAR XCEL BLUNT TIP 100MML (ENDOMECHANICALS) ×4 IMPLANT
TROCAR XCEL NON-BLD 11X100MML (ENDOMECHANICALS) ×4 IMPLANT
TROCAR XCEL NON-BLD 5MMX100MML (ENDOMECHANICALS) ×4 IMPLANT
TUBING INSUFFLATION (TUBING) ×4 IMPLANT

## 2016-06-26 NOTE — Interval H&P Note (Signed)
History and Physical Interval Note:  06/26/2016 9:37 AM  Samuel Little  has presented today for surgery, with the diagnosis of cholecistitis  The various methods of treatment have been discussed with the patient and family. After consideration of risks, benefits and other options for treatment, the patient has consented to  Procedure(s): LAPAROSCOPIC CHOLECYSTECTOMY WITH INTRAOPERATIVE CHOLANGIOGRAM (N/A) as a surgical intervention .  The patient's history has been reviewed, patient examined, no change in status, stable for surgery.  I have reviewed the patient's chart and labs.  Questions were answered to the patient's satisfaction.     Brionna Romanek

## 2016-06-26 NOTE — OR Nursing (Signed)
Cedric Fishman RN first assisted from [phone]

## 2016-06-26 NOTE — Anesthesia Procedure Notes (Signed)
Procedure Name: Intubation Date/Time: 06/26/2016 10:19 AM Performed by: Garrison Columbus T Pre-anesthesia Checklist: Patient identified, Emergency Drugs available, Suction available and Patient being monitored Patient Re-evaluated:Patient Re-evaluated prior to inductionOxygen Delivery Method: Circle System Utilized Preoxygenation: Pre-oxygenation with 100% oxygen Intubation Type: IV induction and Rapid sequence Laryngoscope Size: Miller and 2 Grade View: Grade I Tube type: Oral Tube size: 7.5 mm Number of attempts: 1 Airway Equipment and Method: Stylet and Oral airway Placement Confirmation: ETT inserted through vocal cords under direct vision,  positive ETCO2 and breath sounds checked- equal and bilateral Secured at: 23 cm Tube secured with: Tape Dental Injury: Teeth and Oropharynx as per pre-operative assessment

## 2016-06-26 NOTE — Anesthesia Preprocedure Evaluation (Addendum)
Anesthesia Evaluation  Patient identified by MRN, date of birth, ID band Patient awake    Reviewed: Allergy & Precautions, NPO status , Patient's Chart, lab work & pertinent test results  Airway Mallampati: II  TM Distance: >3 FB Neck ROM: Limited    Dental  (+) Dental Advisory Given, Chipped,    Pulmonary    Pulmonary exam normal        Cardiovascular hypertension, Pt. on medications Normal cardiovascular exam     Neuro/Psych    GI/Hepatic GERD  Medicated,  Endo/Other  Hypothyroidism   Renal/GU      Musculoskeletal  (+) Arthritis ,   Abdominal   Peds  Hematology   Anesthesia Other Findings   Reproductive/Obstetrics                           Anesthesia Physical Anesthesia Plan  ASA: III  Anesthesia Plan: General   Post-op Pain Management:    Induction: Intravenous  Airway Management Planned: Oral ETT  Additional Equipment:   Intra-op Plan:   Post-operative Plan: Extubation in OR  Informed Consent: I have reviewed the patients History and Physical, chart, labs and discussed the procedure including the risks, benefits and alternatives for the proposed anesthesia with the patient or authorized representative who has indicated his/her understanding and acceptance.     Plan Discussed with: CRNA and Surgeon  Anesthesia Plan Comments:       Anesthesia Quick Evaluation

## 2016-06-26 NOTE — Transfer of Care (Signed)
Immediate Anesthesia Transfer of Care Note  Patient: Samuel Little Seen  Procedure(s) Performed: Procedure(s): LAPAROSCOPIC CHOLECYSTECTOMY  subtotal  Patient Location: PACU  Anesthesia Type:General  Level of Consciousness: awake and alert   Airway & Oxygen Therapy: Patient Spontanous Breathing and Patient connected to face mask oxygen  Post-op Assessment: Report given to RN, Post -op Vital signs reviewed and stable and Patient moving all extremities X 4  Post vital signs: Reviewed and stable  Last Vitals:  Vitals:   06/25/16 1953 06/26/16 0624  BP: (!) 150/60 (!) 145/71  Pulse: 100 99  Resp: 19 (!) 21  Temp: 37.1 C 36.9 C    Last Pain:  Vitals:   06/26/16 0624  TempSrc: Oral  PainSc:          Complications: No apparent anesthesia complications

## 2016-06-26 NOTE — Progress Notes (Signed)
Patient ID: Samuel Little, male   DOB: 1932/10/01, 83 y.o.   MRN: [id number]                                                                PROGRESS NOTE                                                                                                                                                                                                             Patient Demographics:    Samuel Little, is a 83 y.o. male, DOB - 11-01-1932, W[facility]:[id number]  Admit date - 06/24/2016   Admitting Physician Samuel Dada, MD  Outpatient Primary MD for the patient is Samuel Low, MD  LOS - 2  Outpatient Specialists:  Chief Complaint  Patient presents with  . Abdominal Pain       Brief Narrative  83 y.o.malewith a past medical history significant for HTN, hypothyroidism, AVNRT status post ablation, goutwho presents with RUQ pain.  The patient was in his usual state of health until Tuesday night when he developed emesis all night long. The next day he had some right upper quadrant pain that is new, was evaluated at his PCPs office, who recommended expectant management. By Thursday the pain has worsened, was constant, associated with nausea and vomiting and so he came to the ER. There his TBili was 2.9 and WBC 11.4K, his [place] was interpreted as cholelithiasis and he was discharged with pain medication and Gen Surg follow up for cholethiasis.  Overnight he took several doses of pain medication without relief, and today he felt continued pain, with any movement, belching, or deep breaths, so he came back to the emergency room.  ED course: -Afebrile, heart rate 107, respirations 18-41,blood pressure 141/70, pulse oximetry normal -Na 128, K 3.8, Cr 1.01(baseline 1.0), WBC 18.9K, Hgb 15.1 -Total bilirubin 4.6 -Chest x-ray showed no focal opacity -Lipase normal -Ultrasound of the right upper quadrant showed 5 mm wall thickening, pericholecystic fluid,progression from one day ago -She was given  Unasyn, 1 L IV fluids, and general surgery were consulted -Gen., surgery recommended hospitalization admission for medical management of cholecystitis   Subjective:    Samuel Little today has less pain.  Pt is to OR this am.  NPO.  Denies fever, chills, n/v, diarrhea, brbpr.  Slight  dry eyes.  Denies cp, palp, sob.    Assessment  & Plan :    Principal Problem:   Acute cholecystitis Active Problems:   Hypothyroidism   Essential hypertension   PSVT   Gout   Hyponatremia   Prolonged QT interval   1.. Acute cholecystitis: Switch to unasyn for better coverage due to persistent leukocytosis (slightly worse) D/c rocephin,  Pt had Unasyn 1 dose in ER. Appreciate surgery input,  d/w GI MRCP,=> no choledocholithiasis NPO after midnite.  To OR today   2. Hyponatremia: due to pain Improving, continue ns iv Check cmp in am  3. Long QT interval: Prior QTc normal.  Avoid QT prolonging meds IVF  Monitor on tele for now  4. Hypothyroidism: Continue levothyroxine  5. Hypertension: Holding HCTZ , may need to restart  Continue lisinopril, doxazosin  6. History of AVNRT: Stable  7. Gout: Continue allopurinol   DVT prophylaxis:SCDs until surgery Code Status:FULL Family Communication:None present Disposition Plan:Anticipate IV antibiotics and General Surgery consultation. Consults called:General Surgery,Admission status:INPATIENT    Lab Results  Component Value Date   PLT 200 06/26/2016      Anti-infectives    Start     Dose/Rate Route Frequency Ordered Stop   06/25/16 1145  Ampicillin-Sulbactam (UNASYN) 3 g in sodium chloride 0.9 % 100 mL IVPB     3 g 200 mL/hr over 30 Minutes Intravenous Every 8 hours 06/25/16 1137     06/25/16 0600  cefTRIAXone (ROCEPHIN) 2 g in dextrose 5 % 50 mL IVPB  Status:  Discontinued     2 g 100 mL/hr over 30 Minutes Intravenous Every 24 hours 06/25/16 0043 06/25/16 1122   06/24/16 1900  ampicillin-sulbactam  (UNASYN) 1.5 g in sodium chloride 0.9 % 50 mL IVPB     1.5 g 100 mL/hr over 30 Minutes Intravenous  Once 06/24/16 1848 06/24/16 2211        Objective:   Vitals:   06/25/16 0037 06/25/16 0529 06/25/16 1953 06/26/16 0624  BP: (!) 138/91 127/70 (!) 150/60 (!) 145/71  Pulse: 92 87 100 99  Resp: (!) 24 (!) 21 19 (!) 21  Temp: 98.6 F (37 C) 98 F (36.7 C) 98.7 F (37.1 C) 98.4 F (36.9 C)  TempSrc: Oral Oral Oral Oral  SpO2: 94% 95% 94% 95%  Weight: 95.9 kg (211 lb 6.7 oz)     Height: 6' (1.829 m)       Wt Readings from Last 3 Encounters:  06/25/16 95.9 kg (211 lb 6.7 oz)  09/22/15 93.9 kg (207 lb)  05/21/15 94.3 kg (208 lb)     Intake/Output Summary (Last 24 hours) at 06/26/16 0903 Last data filed at 06/26/16 0901  Gross per 24 hour  Intake          2398.33 ml  Output              650 ml  Net          1748.33 ml     Physical Exam  Awake Alert, Oriented X 3, No new F.N deficits, Normal affect [place].AT,PERRAL Supple Neck,No JVD, No cervical lymphadenopathy appriciated.  Symmetrical Chest wall movement, Good air movement bilaterally, CTAB, slight decrease bs at the bil base RRR,No Gallops,Rubs or new Murmurs, No Parasternal Heave +ve B.Sounds, Abd Soft, No tenderness, No organomegaly appriciated, No rebound - guarding or rigidity. No Cyanosis, Clubbing or edema, No new Rash or bruise     Data Review:    CBC  Recent Labs  Lab 06/23/16 1834 06/24/16 1815 06/25/16 0603 06/26/16 0332  WBC 11.4* 18.9* 19.6* 22.5*  HGB 15.2 15.1 14.4 13.7  HCT 43.6 42.2 41.2 39.2  PLT 136* 126* 133* 200  MCV 94.0 92.5 93.4 93.6  MCH 32.8 33.1 32.7 32.7  MCHC 34.9 35.8 35.0 34.9  RDW 14.7 14.4 14.6 15.2  LYMPHSABS  --  0.5*  --   --   MONOABS  --  2.0*  --   --   EOSABS  --  0.0  --   --   BASOSABS  --  0.0  --   --     Chemistries   Recent Labs Lab 06/23/16 1834 06/24/16 1815 06/25/16 0603 06/26/16 0332  NA 130* 128* 132* 132*  K 4.0 3.8 4.3 4.0  CL 97* 95* 98* 101   CO2 23 23 26 25   GLUCOSE 138* 163* 138* 123*  BUN 13 19 18  27*  CREATININE 0.90 1.01 1.02 1.00  CALCIUM 9.3 9.1 9.0 8.5*  AST 28 27 27   --   ALT 24 22 25   --   ALKPHOS 65 67 75  --   BILITOT 2.9* 4.6* 4.4*  --    ------------------------------------------------------------------------------------------------------------------ No results for input(s): CHOL, HDL, LDLCALC, TRIG, CHOLHDL, LDLDIRECT in the last 72 hours.  Lab Results  Component Value Date   HGBA1C (H) 10/16/2009    5.9 (NOTE)                                                                       According to the ADA Clinical Practice Recommendations for 2011, when HbA1c is used as a screening test:   >=6.5%   Diagnostic of Diabetes Mellitus           (if abnormal result  is confirmed)  5.7-6.4%   Increased risk of developing Diabetes Mellitus  References:Diagnosis and Classification of Diabetes Mellitus,Diabetes Care,2011,34(Suppl 1):S62-S69 and Standards of Medical Care in         Diabetes - 2011,Diabetes WKGS,8110,31  (Suppl 1):S11-S61.   ------------------------------------------------------------------------------------------------------------------ No results for input(s): TSH, T4TOTAL, T3FREE, THYROIDAB in the last 72 hours.  Invalid input(s): FREET3 ------------------------------------------------------------------------------------------------------------------ No results for input(s): VITAMINB12, FOLATE, FERRITIN, TIBC, IRON, RETICCTPCT in the last 72 hours.  Coagulation profile  Recent Labs Lab 06/25/16 0603 06/26/16 0332  INR 1.57 1.65    No results for input(s): DDIMER in the last 72 hours.  Cardiac Enzymes No results for input(s): CKMB, TROPONINI, MYOGLOBIN in the last 168 hours.  Invalid input(s): CK ------------------------------------------------------------------------------------------------------------------ No results found for: BNP  Inpatient Medications  Scheduled Meds: . allopurinol   100 mg Oral Daily  . ampicillin-sulbactam (UNASYN) IV  3 g Intravenous Q8H  . aspirin EC  81 mg Oral Daily  . doxazosin  4 mg Oral QHS  . levothyroxine  175 mcg Oral QAC breakfast  . lisinopril  20 mg Oral Daily  . sodium chloride flush  3 mL Intravenous Q12H   Continuous Infusions: . sodium chloride 100 mL/hr at 06/26/16 0035   PRN Meds:.HYDROmorphone (DILAUDID) injection, oxyCODONE  Micro Results Recent Results (from the past 240 hour(s))  Surgical pcr screen     Status: None   Collection Time: 06/25/16  8:49 PM  Result Value Ref Range Status  MRSA, PCR NEGATIVE NEGATIVE Final   Staphylococcus aureus NEGATIVE NEGATIVE Final    Comment:        The Xpert SA Assay (FDA approved for NASAL specimens in patients over 37 years of age), is one component of a comprehensive surveillance program.  Test performance has been validated by [facility] for patients greater than or equal to 85 year old. It is not intended to diagnose infection nor to guide or monitor treatment.     Radiology Reports Mr 3d Recon At Scanner  Result Date: 06/25/2016 CLINICAL DATA:  Abdominal pain since Tuesday.  Nausea and vomiting. EXAM: MRI ABDOMEN WITHOUT AND WITH CONTRAST (INCLUDING MRCP) TECHNIQUE: Multiplanar multisequence MR imaging of the abdomen was performed both before and after the administration of intravenous contrast. Heavily T2-weighted images of the biliary and pancreatic ducts were obtained, and three-dimensional MRCP images were rendered by post processing. CONTRAST:  43mL MULTIHANCE GADOBENATE DIMEGLUMINE 529 MG/ML IV SOLN COMPARISON:  Ultrasounds of 06/24/2016 and 06/23/2016. CT of 12/18/2013. FINDINGS: Mild to moderate motion degradation throughout, especially involving the pre and postcontrast dynamic images. Lower chest: Right hemidiaphragm elevation. Right base airspace disease with small right and trace left pleural effusions. Mild cardiomegaly. Hepatobiliary: No focal liver lesion.  Gallbladder sludge and small stones. Gallbladder distension, including at 10 cm on image 25/series 8. No biliary duct dilatation. Right upper quadrant edema, including on image 30/series 8. Normal common duct caliber, including image 42/series 10. No choledocholithiasis. Pancreas:  Normal pancreas for age, without duct dilatation. Spleen:  Normal in size, without focal abnormality. Adrenals/Urinary Tract: Normal adrenal glands. left renal too small to characterize lesion. 13 mm interpolar right renal cyst. No hydronephrosis. Stomach/Bowel: Tiny hiatal hernia. Normal abdominal small bowel loops. There are colonic diverticula. Hepatic flexure colon is immediately adjacent to the inflammation, including on image 31/series 7. Vascular/Lymphatic: Aortic and branch vessel atherosclerosis. No retroperitoneal or retrocrural adenopathy. Other: Small volume perihepatic ascites is complex, as evidenced by surrounding peritoneal thickening and suggestion of loculation adjacent the right hepatic lobe, including image 99/ series 14003. Musculoskeletal: No acute osseous abnormality. IMPRESSION: 1. Mild to moderate motion degradation. 2. Cholelithiasis with gallbladder distension. Right upper quadrant inflammation, suggesting acute cholecystitis. 3. Complex small volume perihepatic ascites could represent infected ascites. 4. Right upper quadrant edema is immediately adjacent to the hepatic flexure of the colon, with diverticula in this region. A component of diverticulitis cannot be excluded but is felt less likely the cause and cholecystitis. 5. Small right and trace left pleural effusions. Right hemidiaphragm elevation with right base airspace disease, favoring atelectasis. 6. Given the extent of motion on the current exam, follow-up with CT (if symptoms warrant) should be considered. Electronically Signed   By: Abigail Miyamoto M.D.   On: 06/25/2016 18:17   US Abdomen Limited  Result Date: 06/24/2016 CLINICAL DATA:  Persistent  RIGHT upper quadrant pain and leukocytosis. EXAM: US ABDOMEN LIMITED - RIGHT UPPER QUADRANT COMPARISON:  Abdominal ultrasound June 23, 2016. FINDINGS: Gallbladder: Gallbladder wall thickening at 5 mm. Pericholecystic fluid. Echogenic sludge in a few punctate suspected gallstones. No sonographic Murphy's sign elicited though, there is general RIGHT upper quadrant pain. Common bile duct: Diameter: 5 mm Liver: No focal lesion identified. Within normal limits in parenchymal echogenicity. Small amount of perihepatic ascites. IMPRESSION: Sonographic findings of acute cholecystitis progressed from 1 day prior, even in the absence of Murphy's sign. New small volume perihepatic ascites. Electronically Signed   By: Elon Alas M.D.   On: 06/24/2016 20:52  Dg Chest Portable 1 View  Result Date: 06/24/2016 CLINICAL DATA:  Worsening abdominal pain EXAM: PORTABLE CHEST 1 VIEW COMPARISON:  06/23/2016 gallbladder ultrasound, CT abdomen from 12/18/2013 and CXR 12/18/2013 FINDINGS: Elevated right hemidiaphragm appears to be a chronic finding. There is atelectasis at the lung bases. No pneumonic consolidation, effusion or pneumothorax. There is aortic atherosclerosis with slight uncoiling of the aorta. Borderline cardiomegaly. No pneumothorax. No acute nor suspicious osseous abnormalities. IMPRESSION: Chronic elevation of the right hemidiaphragm with right basilar atelectasis. Aortic atherosclerosis. No acute pneumonic consolidation, CHF nor effusion. Electronically Signed   By: Ashley Royalty M.D.   On: 06/24/2016 19:20   Mr Abdomen Mrcp W Wo Contast  Result Date: 06/25/2016 CLINICAL DATA:  Abdominal pain since Tuesday.  Nausea and vomiting. EXAM: MRI ABDOMEN WITHOUT AND WITH CONTRAST (INCLUDING MRCP) TECHNIQUE: Multiplanar multisequence MR imaging of the abdomen was performed both before and after the administration of intravenous contrast. Heavily T2-weighted images of the biliary and pancreatic ducts were obtained,  and three-dimensional MRCP images were rendered by post processing. CONTRAST:  32mL MULTIHANCE GADOBENATE DIMEGLUMINE 529 MG/ML IV SOLN COMPARISON:  Ultrasounds of 06/24/2016 and 06/23/2016. CT of 12/18/2013. FINDINGS: Mild to moderate motion degradation throughout, especially involving the pre and postcontrast dynamic images. Lower chest: Right hemidiaphragm elevation. Right base airspace disease with small right and trace left pleural effusions. Mild cardiomegaly. Hepatobiliary: No focal liver lesion. Gallbladder sludge and small stones. Gallbladder distension, including at 10 cm on image 25/series 8. No biliary duct dilatation. Right upper quadrant edema, including on image 30/series 8. Normal common duct caliber, including image 42/series 10. No choledocholithiasis. Pancreas:  Normal pancreas for age, without duct dilatation. Spleen:  Normal in size, without focal abnormality. Adrenals/Urinary Tract: Normal adrenal glands. left renal too small to characterize lesion. 13 mm interpolar right renal cyst. No hydronephrosis. Stomach/Bowel: Tiny hiatal hernia. Normal abdominal small bowel loops. There are colonic diverticula. Hepatic flexure colon is immediately adjacent to the inflammation, including on image 31/series 7. Vascular/Lymphatic: Aortic and branch vessel atherosclerosis. No retroperitoneal or retrocrural adenopathy. Other: Small volume perihepatic ascites is complex, as evidenced by surrounding peritoneal thickening and suggestion of loculation adjacent the right hepatic lobe, including image 99/ series 14003. Musculoskeletal: No acute osseous abnormality. IMPRESSION: 1. Mild to moderate motion degradation. 2. Cholelithiasis with gallbladder distension. Right upper quadrant inflammation, suggesting acute cholecystitis. 3. Complex small volume perihepatic ascites could represent infected ascites. 4. Right upper quadrant edema is immediately adjacent to the hepatic flexure of the colon, with diverticula in  this region. A component of diverticulitis cannot be excluded but is felt less likely the cause and cholecystitis. 5. Small right and trace left pleural effusions. Right hemidiaphragm elevation with right base airspace disease, favoring atelectasis. 6. Given the extent of motion on the current exam, follow-up with CT (if symptoms warrant) should be considered. Electronically Signed   By: Abigail Miyamoto M.D.   On: 06/25/2016 18:17   US Abdomen Limited Ruq  Result Date: 06/23/2016 CLINICAL DATA:  Initial evaluation for acute right upper quadrant pain. EXAM: US ABDOMEN LIMITED - RIGHT UPPER QUADRANT COMPARISON:  Prior CT from 12/18/2013. FINDINGS: Gallbladder: Stones and sludge present within the gallbladder lumen. Largest stone measured approximately 5 mm. No free pericholecystic fluid. No abnormal gallbladder wall thickening. No sonographic Murphy sign elicited on exam. Common bile duct: Diameter: 4.2 mm Liver: No focal lesion identified. Within normal limits in parenchymal echogenicity. IMPRESSION: 1. Stones and sludge within the gallbladder lumen. No other sonographic features to  suggest acute cholecystitis. 2. No biliary dilatation. Electronically Signed   By: Jeannine Boga M.D.   On: 06/23/2016 22:33    Time Spent in minutes  30   Jani Gravel M.D on 06/26/2016 at 9:03 AM  Between 7am to 7pm - Pager - [phone]  After 7pm go to [email or web address] - password [email or web address]  [facility] -  Office  [phone]

## 2016-06-26 NOTE — Op Note (Signed)
Laparoscopic Subtotal Cholecystectomy   Indications: This patient presents with acute cholecystitis and will undergo laparoscopic cholecystectomy.  Pre-operative Diagnosis: acute calculous cholecystitis  Post-operative Diagnosis: gangrenous cholecystitis  Surgeon: SFKCLE,XNTZG   Assistants: Fanny Skates, MD Cedric Fishman, RNFA  Anesthesia: General endotracheal anesthesia and local  ASA Class: 3  Procedure Details  The patient was seen again in the Holding Room. The risks, benefits, complications, treatment options, and expected outcomes were discussed with the patient. The possibilities of  bleeding, recurrent infection, damage to nearby structures, the need for additional procedures, failure to diagnose a condition, the possible need to convert to an open procedure, and creating a complication requiring transfusion or operation were discussed with the patient. The likelihood of improving the patient's symptoms with return to their baseline status is good.    The patient and/or family concurred with the proposed plan, giving informed consent. The site of surgery properly noted. The patient was taken to Operating Room, and the procedure verified as Laparoscopic Cholecystectomy with Intraoperative Cholangiogram. A Time Out was held and the above information confirmed.  Prior to the induction of general anesthesia, antibiotic prophylaxis was administered. General endotracheal anesthesia was then administered and tolerated well. After the induction, the abdomen was prepped with Chloraprep and draped in the sterile fashion. The patient was positioned in the supine position.  Local anesthetic agent was injected into the skin near the umbilicus and an incision made. We dissected down to the abdominal fascia with blunt dissection.  The fascia was incised vertically and we entered the peritoneal cavity bluntly.  A pursestring suture of 0-Vicryl was placed around the fascial opening.  The Hasson  cannula was inserted and secured with the stay suture.  Pneumoperitoneum was then created with CO2 and tolerated well without any adverse changes in the patient's vital signs. An 11-mm port was placed in the subxiphoid position.  There were dramatic omental adhesions.  Some of these were taken down bluntly.  Two 5-mm ports were placed in the right upper quadrant. All skin incisions were infiltrated with a local anesthetic agent before making the incision and placing the trocars.   We positioned the patient in reverse Trendelenburg, tilted slightly to the patient's left.  The gallbladder was identified.  It was very distended.  The [place] suction was used to aspirated the gallbladder.  The bile was green.  The gallbladder was was gangrenous.  Adhesions were lysed bluntly and with the electrocautery where indicated, taking care not to injure any adjacent organs or viscus. The infundibulum was grasped and retracted laterally, but the gallbladder wall was paper thin and necrotic.  The infundibulum was not able to be safely dissected out.  The gallbladder was then dissected from the liver in dome down fashion.  The infundibulum and cystic duct were necrotic and densely adherent to the common bile duct.  The gallbladder was removed at the bottom just above the infundibulum.    The gallbladder was removed and placed in an Endocatch bag.  The gallbladder and Endocatch bag were then removed through the umbilical port site.  The liver bed was irrigated and inspected. Hemostasis was achieved with the electrocautery. Copious irrigation was utilized and was repeatedly aspirated until clear.    We again inspected the right upper quadrant for hemostasis.  A 19 Fr Blake drain was placed in the gallbladder fossa.  This was secured with a 2-0 nylon.  Pneumoperitoneum was released as we removed the trocars.   The pursestring suture was used to close the  umbilical fascia.  4-0 Monocryl was used to close the skin.   The skin was  cleaned and dry, and Dermabond was applied. The patient was then extubated and brought to the recovery room in stable condition. Instrument, sponge, and needle counts were correct at closure and at the conclusion of the case.   Findings: Gangrenous gallbladder.  Dense omental adhesions.  .    Estimated Blood Loss: 50 mL         Drains: 19 Fr Blake.          Specimens: Gallbladder to pathology       Complications: None; patient tolerated the procedure well.         Disposition: PACU - hemodynamically stable.         Condition: stable

## 2016-06-26 NOTE — Anesthesia Postprocedure Evaluation (Signed)
Anesthesia Post Note  Patient: Samuel Little  Procedure(s) Performed: Procedure(s): LAPAROSCOPIC CHOLECYSTECTOMY  subtotal  Patient location during evaluation: PACU Anesthesia Type: General Level of consciousness: awake and alert Pain management: pain level controlled Vital Signs Assessment: post-procedure vital signs reviewed and stable Respiratory status: spontaneous breathing, nonlabored ventilation, respiratory function stable and patient connected to nasal cannula oxygen Cardiovascular status: blood pressure returned to baseline and stable Postop Assessment: no signs of nausea or vomiting Anesthetic complications: no       Last Vitals:  Vitals:   06/26/16 0624 06/26/16 1145  BP: (!) 145/71 119/66  Pulse: 99 87  Resp: (!) 21 (!) 23  Temp: 36.9 C 36.6 C    Last Pain:  Vitals:   06/26/16 1145  TempSrc:   PainSc: [place] DAVID

## 2016-06-26 NOTE — H&P (View-Only) (Signed)
Reason for Consult:Cholecystitis Referring Physician: Ellie Lunch, MD  Samuel Little is an 83 y.o. male.  HPI:  Pt is an 83 yo M with 3 days of abdominal pain.  He came to ED 4/12 with abdominal pain, but pt became pain free and had no evidence of cholecystitis on imaging.  He returns with recurrent pain.  He states that this time is much more severe.  He describes it as a sharp pain in the middle and upper right abdomen.   He has not had n/v.  He denies chest pain/SOB.  He has not noted any jaundice.  He has previously had nausea/vomiting, but not this time.  He has not had an appetite.  He did not try any over the counter medications to help with the pain.    Past Medical History:  Diagnosis Date  . 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 Arthritis   . Gastroesophageal reflux disease   . Gout   . Hypertension   . Hypothyroidism   . Macular degeneration   . Pleural effusion, left   . Right bundle branch block   . Thyroid disease    hypothyroidism    Past Surgical History:  Procedure Laterality Date  . CARDIAC CATHETERIZATION  2007  . CARDIAC ELECTROPHYSIOLOGY STUDY AND ABLATION    . CATARACT EXTRACTION    . HERNIA REPAIR    . MASS EXCISION Right 09/22/2015   Procedure: RIGHT THUMB EXCISION MASS;  Surgeon: Leanora Cover, MD;  Location: [facility];  Service: Orthopedics;  Laterality: Right;  . ROTATOR CUFF REPAIR    . TONSILLECTOMY      Family History  Problem Relation Age of Onset  . Hypertension Mother   . Diabetes Mother   . Hypertension Father   . Heart attack Father   . Diabetes Father   . Heart attack Brother   . Heart attack Paternal Uncle   . Heart attack Paternal Uncle   . Heart attack Paternal Uncle   . Other Son     Leak in aorta    Social History:  reports that he has never smoked. He has never used smokeless tobacco. He reports that he does not drink alcohol or  use drugs.  Allergies:  Allergies  Allergen Reactions  . Celecoxib Nausea And Vomiting  . Meperidine Hcl     REACTION: Nausea  . Nsaids Other (See Comments)    jaundice    Medications:  Prior to Admission:  Prescriptions Prior to Admission  Medication Sig Dispense Refill Last Dose  . Acetaminophen (TYLENOL ARTHRITIS PAIN PO) Take 650 mg by mouth daily as needed (pain).    unk  . allopurinol (ZYLOPRIM) 100 MG tablet Take 100 mg by mouth daily.   06/24/2016 at Unknown time  . Ascorbic Acid (VITAMIN C) 500 MG CAPS Take 500 mg by mouth daily.    Past Week at Unknown time  . aspirin EC 81 MG tablet Take 81 mg by mouth daily.   06/23/2016 at Unknown time  . Bilberry, Vaccinium myrtillus, (BILBERRY PO) Take 1 tablet by mouth daily.    Past Week at Unknown time  . Calcium-Vitamin D (CALTRATE 600 PLUS-VIT D PO) Take 1 tablet by mouth daily.    Past Week at Unknown time  . Cholecalciferol (VITAMIN D) 2000 UNITS tablet Take 2,000 Units by mouth daily.   Past Week at Unknown time  . Cobalamine Combinations (  VITAMIN B12-FOLIC ACID PO) Take 1 tablet by mouth daily.    Past Week at Unknown time  . Coenzyme Q10 (CO Q 10 PO) Take 1 tablet by mouth daily.    Past Week at Unknown time  . colchicine 0.6 MG tablet Take 0.6 mg by mouth daily as needed (gout).    unk  . CORAL CALCIUM PO Take 2 tablets by mouth daily.   Past Week at Unknown time  . CRANBERRY PO Take 1 tablet by mouth daily.    Past Week at Unknown time  . doxazosin (CARDURA) 4 MG tablet Take 4 mg by mouth at bedtime.    06/23/2016 at Unknown time  . GARLIC PO Take 1 tablet by mouth daily.   Past Week at Unknown time  . GINKGO BILOBA PO Take 1 tablet by mouth daily.   Past Week at Unknown time  . Glucos-Chondroit-Hyaluron-MSM (GLUCOSAMINE CHONDROITIN JOINT PO) Take 1 tablet by mouth daily.    Past Week at Unknown time  . HYDROcodone-acetaminophen (NORCO/VICODIN) 5-325 MG tablet Take 1-2 tablets by mouth every 8 (eight) hours as needed for severe  pain. 30 tablet 0 unk  . KRILL OIL ULTRA STRENGTH PO Take 1 tablet by mouth daily.    Past Week at Unknown time  . levothyroxine (SYNTHROID, LEVOTHROID) 175 MCG tablet Take 175 mcg by mouth daily before breakfast.   06/24/2016 at Unknown time  . lisinopril-hydrochlorothiazide (PRINZIDE,ZESTORETIC) 20-12.5 MG per tablet Take 1 tablet by mouth daily.   06/24/2016 at Unknown time  . Misc Natural Products (BLACK CHERRY CONCENTRATE PO) Take 1 tablet by mouth daily.    Past Week at Unknown time  . multivitamin-lutein (OCUVITE-LUTEIN) CAPS capsule Take 2 capsules by mouth 2 (two) times daily.    Past Week at Unknown time  . omeprazole (PRILOSEC) 20 MG capsule Take 20 mg by mouth daily as needed (acid reflux).    unk  . RESVERATROL PO Take 1 tablet by mouth daily.    Past Week at Unknown time  . senna (SENOKOT) 8.6 MG TABS tablet Take 1 tablet (8.6 mg total) by mouth at bedtime as needed for mild constipation. 10 each 0 unk  . traMADol (ULTRAM) 50 MG tablet Take 50 mg by mouth every 6 (six) hours as needed for moderate pain.    unk  . TURMERIC PO Take 1 tablet by mouth daily.    Past Week at Unknown time    Results for orders placed or performed during the hospital encounter of 06/24/16 (from the past 48 hour(s))  CBC with Differential     Status: Abnormal   Collection Time: 06/24/16  6:15 PM  Result Value Ref Range   WBC 18.9 (H) 4.0 - 10.5 K/uL   RBC 4.56 4.22 - 5.81 MIL/uL   Hemoglobin 15.1 13.0 - 17.0 g/dL   HCT 42.2 39.0 - 52.0 %   MCV 92.5 78.0 - 100.0 fL   MCH 33.1 26.0 - 34.0 pg   MCHC 35.8 30.0 - 36.0 g/dL   RDW 14.4 11.5 - 15.5 %   Platelets 126 (L) 150 - 400 K/uL   Neutrophils Relative % 87 %   Neutro Abs 16.4 (H) 1.7 - 7.7 K/uL   Lymphocytes Relative 3 %   Lymphs Abs 0.5 (L) 0.7 - 4.0 K/uL   Monocytes Relative 10 %   Monocytes Absolute 2.0 (H) 0.1 - 1.0 K/uL   Eosinophils Relative 0 %   Eosinophils Absolute 0.0 0.0 - 0.7 K/uL   Basophils  Relative 0 %   Basophils Absolute 0.0 0.0  - 0.1 K/uL  Comprehensive metabolic panel     Status: Abnormal   Collection Time: 06/24/16  6:15 PM  Result Value Ref Range   Sodium 128 (L) 135 - 145 mmol/L   Potassium 3.8 3.5 - 5.1 mmol/L   Chloride 95 (L) 101 - 111 mmol/L   CO2 23 22 - 32 mmol/L   Glucose, Bld 163 (H) 65 - 99 mg/dL   BUN 19 6 - 20 mg/dL   Creatinine, Ser 1.01 0.61 - 1.24 mg/dL   Calcium 9.1 8.9 - 10.3 mg/dL   Total Protein 6.4 (L) 6.5 - 8.1 g/dL   Albumin 3.3 (L) 3.5 - 5.0 g/dL   AST 27 15 - 41 U/L   ALT 22 17 - 63 U/L   Alkaline Phosphatase 67 38 - 126 U/L   Total Bilirubin 4.6 (H) 0.3 - 1.2 mg/dL   GFR calc non Af Amer >60 >60 mL/min   GFR calc Af Amer >60 >60 mL/min    Comment: (NOTE) The eGFR has been calculated using the CKD EPI equation. This calculation has not been validated in all clinical situations. eGFR's persistently <60 mL/min signify possible Chronic Kidney Disease.    Anion gap 10 5 - 15  Lipase, blood     Status: None   Collection Time: 06/24/16  6:15 PM  Result Value Ref Range   Lipase 29 11 - 51 U/L    US Abdomen Limited  Result Date: 06/24/2016 CLINICAL DATA:  Persistent RIGHT upper quadrant pain and leukocytosis. EXAM: US ABDOMEN LIMITED - RIGHT UPPER QUADRANT COMPARISON:  Abdominal ultrasound June 23, 2016. FINDINGS: Gallbladder: Gallbladder wall thickening at 5 mm. Pericholecystic fluid. Echogenic sludge in a few punctate suspected gallstones. No sonographic Murphy's sign elicited though, there is general RIGHT upper quadrant pain. Common bile duct: Diameter: 5 mm Liver: No focal lesion identified. Within normal limits in parenchymal echogenicity. Small amount of perihepatic ascites. IMPRESSION: Sonographic findings of acute cholecystitis progressed from 1 day prior, even in the absence of Murphy's sign. New small volume perihepatic ascites. Electronically Signed   By: Elon Alas M.D.   On: 06/24/2016 20:52   Dg Chest Portable 1 View  Result Date: 06/24/2016 CLINICAL DATA:   Worsening abdominal pain EXAM: PORTABLE CHEST 1 VIEW COMPARISON:  06/23/2016 gallbladder ultrasound, CT abdomen from 12/18/2013 and CXR 12/18/2013 FINDINGS: Elevated right hemidiaphragm appears to be a chronic finding. There is atelectasis at the lung bases. No pneumonic consolidation, effusion or pneumothorax. There is aortic atherosclerosis with slight uncoiling of the aorta. Borderline cardiomegaly. No pneumothorax. No acute nor suspicious osseous abnormalities. IMPRESSION: Chronic elevation of the right hemidiaphragm with right basilar atelectasis. Aortic atherosclerosis. No acute pneumonic consolidation, CHF nor effusion. Electronically Signed   By: Ashley Royalty M.D.   On: 06/24/2016 19:20   US Abdomen Limited Ruq  Result Date: 06/23/2016 CLINICAL DATA:  Initial evaluation for acute right upper quadrant pain. EXAM: US ABDOMEN LIMITED - RIGHT UPPER QUADRANT COMPARISON:  Prior CT from 12/18/2013. FINDINGS: Gallbladder: Stones and sludge present within the gallbladder lumen. Largest stone measured approximately 5 mm. No free pericholecystic fluid. No abnormal gallbladder wall thickening. No sonographic Murphy sign elicited on exam. Common bile duct: Diameter: 4.2 mm Liver: No focal lesion identified. Within normal limits in parenchymal echogenicity. IMPRESSION: 1. Stones and sludge within the gallbladder lumen. No other sonographic features to suggest acute cholecystitis. 2. No biliary dilatation. Electronically Signed   By: Marland Kitchen  Jeannine Boga M.D.   On: 06/23/2016 22:33    Review of Systems  Constitutional: Negative.   HENT: Negative.   Eyes: Negative.   Respiratory: Negative.   Cardiovascular: Negative.   Gastrointestinal: Positive for abdominal pain and nausea. Negative for constipation and diarrhea.  Genitourinary: Negative.   Musculoskeletal: Negative.   Skin: Negative.   Neurological: Negative.   Endo/Heme/Allergies: Negative.   Psychiatric/Behavioral: Negative.    Blood pressure  139/71, pulse 93, temperature 98.8 F (37.1 C), temperature source Oral, resp. rate (!) 29, SpO2 95 %. Physical Exam  Constitutional: He is oriented to person, place, and time. He appears well-developed and well-nourished. He appears distressed (looks uncomfortable).  HENT:  Head: Normocephalic and atraumatic.  Right Ear: External ear normal.  Left Ear: External ear normal.  Eyes: Conjunctivae are normal. Pupils are equal, round, and reactive to light. Scleral icterus (faint icterus) is present.  Neck: Neck supple. No tracheal deviation present. No thyromegaly present.  Cardiovascular: Normal rate and intact distal pulses.   Respiratory: Effort normal. No respiratory distress. He exhibits no tenderness.  GI: Soft. He exhibits distension (mildly distended). There is tenderness (RUQ tenderness). There is no rebound and no guarding.  Musculoskeletal: Normal range of motion. He exhibits no edema.  Lymphadenopathy:    He has no cervical adenopathy.  Neurological: He is alert and oriented to person, place, and time.  Skin: Skin is warm and dry. No rash noted. He is not diaphoretic. No erythema. No pallor.  Psychiatric: He has a normal mood and affect. His behavior is normal. Judgment and thought content normal.    Assessment/Plan: Acute calculous cholecystitis Hyperbilirubinemia - likely choledocholithiasis Hyponatremia Hypochloremia Thrombocytopenia Hyperglycemia  Pt will need GI consult to consider ERCP for presumed obstructing gallstone. Could have element of Mirizzi's syndrome, however.  They may desire MRCP prior to invasive testing.    Appreciate medicine admit for medical tuning up with electrolyte abnormalities and hyperglycemia.  Pt with known bundle branch block for several months.  Appears to have reasonable systolic function on echo and no ischemic symptoms.  ? Need for any additional cardiac workup.    Once GI consulted and no common duct stones appreciated, pt will need  cholecystectomy.  Briefly discussed surgery with patient and family.    Barclay Lennox 06/24/2016, 10:44 PM

## 2016-06-27 ENCOUNTER — Encounter (HOSPITAL_COMMUNITY): Payer: Self-pay | Admitting: General Surgery

## 2016-06-27 DIAGNOSIS — M1A9XX0 Chronic gout, unspecified, without tophus (tophi): Secondary | ICD-10-CM

## 2016-06-27 DIAGNOSIS — K810 Acute cholecystitis: Principal | ICD-10-CM

## 2016-06-27 DIAGNOSIS — K819 Cholecystitis, unspecified: Secondary | ICD-10-CM

## 2016-06-27 DIAGNOSIS — I10 Essential (primary) hypertension: Secondary | ICD-10-CM

## 2016-06-27 LAB — COMPREHENSIVE METABOLIC PANEL
ALBUMIN: 2 g/dL — AB (ref 3.5–5.0)
ALK PHOS: 154 U/L — AB (ref 38–126)
ALT: 41 U/L (ref 17–63)
AST: 46 U/L — AB (ref 15–41)
Anion gap: 6 (ref 5–15)
BILIRUBIN TOTAL: 2.5 mg/dL — AB (ref 0.3–1.2)
BUN: 32 mg/dL — AB (ref 6–20)
CO2: 24 mmol/L (ref 22–32)
Calcium: 8 mg/dL — ABNORMAL LOW (ref 8.9–10.3)
Chloride: 105 mmol/L (ref 101–111)
Creatinine, Ser: 1.03 mg/dL (ref 0.61–1.24)
GFR calc Af Amer: >60 mL/min (ref >60)
GFR calc non Af Amer: >60 mL/min (ref >60)
GLUCOSE: 114 mg/dL — AB (ref 65–99)
POTASSIUM: 3.7 mmol/L (ref 3.5–5.1)
Sodium: 135 mmol/L (ref 135–145)
TOTAL PROTEIN: 4.7 g/dL — AB (ref 6.5–8.1)

## 2016-06-27 LAB — CBC
HEMATOCRIT: 33.8 % — AB (ref 39.0–52.0)
HEMOGLOBIN: 11.6 g/dL — AB (ref 13.0–17.0)
MCH: 32.4 pg (ref 26.0–34.0)
MCHC: 34.3 g/dL (ref 30.0–36.0)
MCV: 94.4 fL (ref 78.0–100.0)
Platelets: 158 10*3/uL (ref 150–400)
RBC: 3.58 MIL/uL — ABNORMAL LOW (ref 4.22–5.81)
RDW: 15.2 % (ref 11.5–15.5)
WBC: 7.4 10*3/uL (ref 4.0–10.5)

## 2016-06-27 LAB — C-REACTIVE PROTEIN: CRP: 19.6 mg/dL — ABNORMAL HIGH (ref <1.0)

## 2016-06-27 LAB — URIC ACID: Uric Acid, Serum: 4.9 mg/dL (ref 4.4–7.6)

## 2016-06-27 LAB — SEDIMENTATION RATE: Sed Rate: 78 mm/hr — ABNORMAL HIGH (ref 0–16)

## 2016-06-27 MED ORDER — SENNOSIDES-DOCUSATE SODIUM 8.6-50 MG PO TABS
1.0000 | ORAL_TABLET | Freq: Two times a day (BID) | ORAL | Status: DC
  Administered 2016-06-27 (×2): 1 via ORAL
  Filled 2016-06-27 (×2): qty 1

## 2016-06-27 MED ORDER — BISACODYL 10 MG RE SUPP
10.0000 mg | Freq: Once | RECTAL | Status: AC
  Administered 2016-06-27: 10 mg via RECTAL
  Filled 2016-06-27: qty 1

## 2016-06-27 MED ORDER — POLYETHYLENE GLYCOL 3350 17 G PO PACK
17.0000 g | PACK | Freq: Every day | ORAL | Status: DC
  Administered 2016-06-27: 17 g via ORAL
  Filled 2016-06-27: qty 1

## 2016-06-27 MED ORDER — PANTOPRAZOLE SODIUM 40 MG PO TBEC
40.0000 mg | DELAYED_RELEASE_TABLET | Freq: Every day | ORAL | Status: DC
  Administered 2016-06-27: 40 mg via ORAL
  Filled 2016-06-27: qty 1

## 2016-06-27 MED ORDER — COLCHICINE 0.6 MG PO TABS
0.6000 mg | ORAL_TABLET | Freq: Every day | ORAL | Status: DC
  Administered 2016-06-27 – 2016-06-28 (×2): 0.6 mg via ORAL
  Filled 2016-06-27 (×2): qty 1

## 2016-06-27 MED ORDER — PREDNISONE 20 MG PO TABS
40.0000 mg | ORAL_TABLET | Freq: Every day | ORAL | Status: DC
  Administered 2016-06-27 – 2016-06-28 (×2): 40 mg via ORAL
  Filled 2016-06-27 (×2): qty 2

## 2016-06-27 MED ORDER — ENOXAPARIN SODIUM 40 MG/0.4ML ~~LOC~~ SOLN
40.0000 mg | SUBCUTANEOUS | Status: DC
  Administered 2016-06-27 – 2016-06-28 (×2): 40 mg via SUBCUTANEOUS
  Filled 2016-06-27 (×2): qty 0.4

## 2016-06-27 NOTE — Evaluation (Addendum)
Physical Therapy Evaluation Patient Details Name: Samuel Little MRN: [id number] DOB: [birth year] Today's Date: 06/27/2016   History of Present Illness  Pt s/p laparoscopic cholecystectomy for acute calculous gangrenous cholecystitis. PMH:  hypertension, hypothyroidism, AVNRT status post ablation, gout presented with right upper quadrant pain.  Clinical Impression  Pt admitted with above diagnosis. Pt currently with functional limitations due to the deficits listed below (see PT Problem List). Pt able to ambulate with RW with min to min guard assist up to 225 feet with RW with overall steady gait and some postural cues. Pt did c/o pain in right foot that feels like "gout" per pt.  Dorsum of foot red.  Pt takes gout med at home and has had a dose here but nursing to call MD to update.  Will follow acutely.  Should progress well and go home with HHPT.   Pt will benefit from skilled PT to increase their independence and safety with mobility to allow discharge to the venue listed below.      Follow Up Recommendations Home health PT;Supervision/Assistance - 24 hour    Equipment Recommendations  None recommended by PT    Recommendations for Other Services       Precautions / Restrictions Precautions Precautions: Fall Restrictions Weight Bearing Restrictions: No      Mobility  Bed Mobility               General bed mobility comments: up in chair on arrival  Transfers Overall transfer level: Needs assistance Equipment used: Rolling walker (2 wheeled) Transfers: Sit to/from Stand Sit to Stand: Min guard         General transfer comment: Pt did not need assist to rise from  chair.  Ambulation/Gait Ambulation/Gait assistance: Min guard Ambulation Distance (Feet): 245 Feet Assistive device: Rolling walker (2 wheeled) Gait Pattern/deviations: Step-through pattern;Decreased stride length;Shuffle;Antalgic;Drifts right/left;Wide base of support;Trunk flexed   Gait velocity  interpretation: Below normal speed for age/gender General Gait Details: ued pt to stand tall entire treatment.  Pt tends to flex which worsens as he tires.  Pt also needed cues to not push RW too far in front of him.  Pt fairly steady with RW and does push himself.  Has a RW he can use at home.  Stairs            Wheelchair Mobility    Modified [name] (Stroke Patients Only)       Balance Overall balance assessment: Needs assistance;History of Falls Sitting-balance support: No upper extremity supported;Feet supported Sitting balance-Leahy Scale: Fair     Standing balance support: Bilateral upper extremity supported;During functional activity Standing balance-Leahy Scale: Fair Standing balance comment: can stand statically without UE support.                             Pertinent Vitals/Pain Pain Assessment: Faces Faces Pain Scale: Hurts little more Pain Location: abdomen, right foot Pain Descriptors / Indicators: Aching Pain Intervention(s): Limited activity within patient's tolerance;Monitored during session;Repositioned    Home Living Family/patient expects to be discharged to:: Private residence Living Arrangements: Spouse/significant other Available Help at Discharge: Family;Available 24 hours/day Type of Home: House Home Access: Stairs to enter Entrance Stairs-Rails:  (2 posts) Technical brewer of Steps: 2 Home Layout: Two level;Laundry or work area in [place]: Environmental consultant - 2 wheels;Bedside commode;Shower seat;Grab bars - tub/shower      Prior Function Level of Independence: Independent  Comments: Pt drove, shopped and mows.     Hand Dominance        Extremity/Trunk Assessment   Upper Extremity Assessment Upper Extremity Assessment: Defer to OT evaluation    Lower Extremity Assessment Lower Extremity Assessment: Generalized weakness    Cervical / Trunk Assessment Cervical / Trunk Assessment: Kyphotic   Communication   Communication: No difficulties  Cognition Arousal/Alertness: Awake/alert Behavior During Therapy: WFL for tasks assessed/performed Overall Cognitive Status: Within Functional Limits for tasks assessed                                        General Comments      Exercises     Assessment/Plan    PT Assessment Patient needs continued PT services  PT Problem List Decreased strength;Decreased activity tolerance;Decreased balance;Decreased mobility;Decreased knowledge of use of DME;Decreased safety awareness;Decreased knowledge of precautions;Pain       PT Treatment Interventions DME instruction;Gait training;Functional mobility training;Therapeutic activities;Therapeutic exercise;Balance training;Patient/family education;Stair training    PT Goals (Current goals can be found in the Care Plan section)  Acute Rehab PT Goals Patient Stated Goal: to go home PT Goal Formulation: With patient Time For Goal Achievement: 07/11/16 Potential to Achieve Goals: Good    Frequency Min 3X/week   Barriers to discharge        Co-evaluation               End of Session Equipment Utilized During Treatment: Gait belt Activity Tolerance: Patient tolerated treatment well;Patient limited by fatigue Patient left: in chair;with call bell/phone within reach;with chair alarm set;with family/visitor present Nurse Communication: Mobility status;Other (comment) (pt with gout pain right LE) PT Visit Diagnosis: Unsteadiness on feet (R26.81);Pain;Muscle weakness (generalized) (M62.81) Pain - Right/Left: Right Pain - part of body: Ankle and joints of foot (abdomen)    Time: 4709-6283 PT Time Calculation (min) (ACUTE ONLY): 17 min   Charges:   PT Evaluation $PT Eval Moderate Complexity: 1 Procedure     PT G Codes:        Jenelle Drennon,PT Acute Rehabilitation [phone] (pager)   Denice Paradise 06/27/2016, 3:39 PM

## 2016-06-27 NOTE — Progress Notes (Addendum)
[facility]                                                                              Patient Demographics  Samuel Little, is a 83 y.o. male, DOB - July 03, 1932, LNL:[id number]  Admit date - 06/24/2016   Admitting Physician Edwin Dada, MD  Outpatient Primary MD for the patient is Wenda Low, MD  Outpatient specialists:   LOS - 3  days    Chief Complaint  Patient presents with  . Abdominal Pain       Brief summary   Patient is a 83 year old male with hypertension, hypothyroidism, AVNRT status post ablation, gout presented with right upper quadrant pain. Ultrasound showed cholelithiasis, 5 mm wall thickening, pericholecystic fluid,progression from one day ago. General surgery was consulted, patient underwent laparoscopic cholecystectomy on 06/26/16.   Assessment & Plan    Principal Problem: Acute cholecystitis: - Overnight did not spike any fevers, leukocytosis trending down, 7.4 today,   -  continue IV Unasyn  - Dr Maudie Mercury discussed with gastroenterology, MRCP did not show any choledocholithiasis  - Gen. surgery was also consulted, patient underwent cholecystectomy for gangrenous gallbladder on 4/15, postoperative day #1 - Currently on regular diet  Active problems Constipation - Placed on Dulcolax suppository, Senokot S, continue MiraLAX     Hyponatremia: due to pain Improvingcontinue normal saline IV fluids  Long QT interval: - QTC on EKG 4/13 515  - Avoid QT prolonging meds - Monitor on tele for now  Hypothyroidism: Continue levothyroxine   Hypertension: HoldingHCTZ , may need to restart  Continue lisinopril, doxazosin  History of AVNRT: Stable   right upper foot pain /Gout: Continue allopurinol - ESR, CRP elevated. Uric acid 4.9 - placed on colchicine, prednisone 65m x 3 days, then short taper. Allergic to NSAIDS, hence placed on steroids.   Code Status: Full CODE STATUS DVT Prophylaxis:   placed on  Lovenox postop  Family Communication: Discussed in detail with the patient, all imaging results, lab results explained to the patient   Disposition Plan:  Once cleared by surgery, needs PT eval   Time Spent in minutes   25 minutes  Procedures:  Cholecystectomy  Consultants:   Gen. surgery  Antimicrobials:   IV Unasyn 4/15>>  IV Rocephin 4/14>4/14   Medications  Scheduled Meds: . allopurinol  100 mg Oral Daily  . ampicillin-sulbactam (UNASYN) IV  3 g Intravenous Q8H  . aspirin EC  81 mg Oral Daily  . bisacodyl  10 mg Rectal Once  . doxazosin  4 mg Oral QHS  . levothyroxine  175 mcg Oral QAC breakfast  . lisinopril  20 mg Oral Daily  . polyethylene glycol  17 g Oral Daily  . senna-docusate  1 tablet Oral BID  . sodium chloride flush  3 mL Intravenous Q12H   Continuous Infusions: . sodium chloride 100 mL/hr at 06/26/16 1601   PRN Meds:.HYDROcodone-acetaminophen, HYDROmorphone (DILAUDID) injection, oxyCODONE   Antibiotics   Anti-infectives    Start     Dose/Rate Route Frequency Ordered Stop   06/25/16 1145  Ampicillin-Sulbactam (UNASYN) 3 g in sodium chloride 0.9 % 100 mL IVPB  3 g 200 mL/hr over 30 Minutes Intravenous Every 8 hours 06/25/16 1137     06/25/16 0600  cefTRIAXone (ROCEPHIN) 2 g in dextrose 5 % 50 mL IVPB  Status:  Discontinued     2 g 100 mL/hr over 30 Minutes Intravenous Every 24 hours 06/25/16 0043 06/25/16 1122   06/24/16 1900  ampicillin-sulbactam (UNASYN) 1.5 g in sodium chloride 0.9 % 50 mL IVPB     1.5 g 100 mL/hr over 30 Minutes Intravenous  Once 06/24/16 1848 06/24/16 2211        Subjective:   Samuel Little was seen and examined today.  Still having abdominal pain, 5 out of 10, no nausea or vomiting. He is constipated. Patient denies dizziness, chest pain, shortness of breath, new weakness, numbess, tingling. No acute events overnight.    Objective:   Vitals:   06/26/16 1404 06/26/16 1509 06/26/16 2000 06/27/16 0516  BP: (!)  106/52 (!) 109/54 (!) 105/59 115/67  Pulse: 83 74 77 77  Resp:  _0 Temp: 98.6 F (37 C) 97.8 F (36.6 C) 98 F (36.7 C) 98.3 F (36.8 C)  TempSrc: Oral Oral Oral Oral  SpO2: 93% 95% 95% 95%  Weight:      Height:        Intake/Output Summary (Last 24 hours) at 06/27/16 1100 Last data filed at 06/27/16 0813  Gross per 24 hour  Intake          3391.33 ml  Output             1615 ml  Net          1776.33 ml     Wt Readings from Last 3 Encounters:  06/25/16 95.9 kg (211 lb 6.7 oz)  09/22/15 93.9 kg (207 lb)  05/21/15 94.3 kg (208 lb)     Exam  General: Alert and oriented x 3, NAD  HEENT:    Neck: Supple, no JVD  Cardiovascular: S1 S2 auscultated, no rubs, murmurs or gallops. Regular rate and rhythm.  Respiratory: Clear to auscultation bilaterally, no wheezing, rales or rhonchi  Gastrointestinal: Soft, Mild diffuse TTP , drain+, mild distended, + bowel sounds  Ext: no cyanosis clubbing or edema  Neuro: AAOx3, Cr N's II- XII. Strength 5/5 upper and lower extremities bilaterally  Skin: No rashes  Psych: Normal affect and demeanor, alert and oriented x3    Data Reviewed:  I have personally reviewed following labs and imaging studies  Micro Results Recent Results (from the past 240 hour(s))  Culture, blood (single)     Status: None (Preliminary result)   Collection Time: 06/25/16  1:01 AM  Result Value Ref Range Status   Specimen Description BLOOD RIGHT ANTECUBITAL  Final   Special Requests   Final    BOTTLES DRAWN AEROBIC AND ANAEROBIC Blood Culture results may not be optimal due to an excessive volume of blood received in culture bottles   Culture NO GROWTH 1 DAY  Final   Report Status PENDING  Incomplete  Surgical pcr screen     Status: None   Collection Time: 06/25/16  8:49 PM  Result Value Ref Range Status   MRSA, PCR NEGATIVE NEGATIVE Final   Staphylococcus aureus NEGATIVE NEGATIVE Final    Comment:        The Xpert SA Assay (FDA approved for  NASAL specimens in patients over 87 years of age), is one component of a comprehensive surveillance program.  Test performance has been validated by [facility]  for patients greater than or equal to 44 year old. It is not intended to diagnose infection nor to guide or monitor treatment.     Radiology Reports Mr 3d Recon At Scanner  Result Date: 06/25/2016 CLINICAL DATA:  Abdominal pain since Tuesday.  Nausea and vomiting. EXAM: MRI ABDOMEN WITHOUT AND WITH CONTRAST (INCLUDING MRCP) TECHNIQUE: Multiplanar multisequence MR imaging of the abdomen was performed both before and after the administration of intravenous contrast. Heavily T2-weighted images of the biliary and pancreatic ducts were obtained, and three-dimensional MRCP images were rendered by post processing. CONTRAST:  81m MULTIHANCE GADOBENATE DIMEGLUMINE 529 MG/ML IV SOLN COMPARISON:  Ultrasounds of 06/24/2016 and 06/23/2016. CT of 12/18/2013. FINDINGS: Mild to moderate motion degradation throughout, especially involving the pre and postcontrast dynamic images. Lower chest: Right hemidiaphragm elevation. Right base airspace disease with small right and trace left pleural effusions. Mild cardiomegaly. Hepatobiliary: No focal liver lesion. Gallbladder sludge and small stones. Gallbladder distension, including at 10 cm on image 25/series 8. No biliary duct dilatation. Right upper quadrant edema, including on image 30/series 8. Normal common duct caliber, including image 42/series 10. No choledocholithiasis. Pancreas:  Normal pancreas for age, without duct dilatation. Spleen:  Normal in size, without focal abnormality. Adrenals/Urinary Tract: Normal adrenal glands. left renal too small to characterize lesion. 13 mm interpolar right renal cyst. No hydronephrosis. Stomach/Bowel: Tiny hiatal hernia. Normal abdominal small bowel loops. There are colonic diverticula. Hepatic flexure colon is immediately adjacent to the inflammation, including on  image 31/series 7. Vascular/Lymphatic: Aortic and branch vessel atherosclerosis. No retroperitoneal or retrocrural adenopathy. Other: Small volume perihepatic ascites is complex, as evidenced by surrounding peritoneal thickening and suggestion of loculation adjacent the right hepatic lobe, including image 99/ series 14003. Musculoskeletal: No acute osseous abnormality. IMPRESSION: 1. Mild to moderate motion degradation. 2. Cholelithiasis with gallbladder distension. Right upper quadrant inflammation, suggesting acute cholecystitis. 3. Complex small volume perihepatic ascites could represent infected ascites. 4. Right upper quadrant edema is immediately adjacent to the hepatic flexure of the colon, with diverticula in this region. A component of diverticulitis cannot be excluded but is felt less likely the cause and cholecystitis. 5. Small right and trace left pleural effusions. Right hemidiaphragm elevation with right base airspace disease, favoring atelectasis. 6. Given the extent of motion on the current exam, follow-up with CT (if symptoms warrant) should be considered. Electronically Signed   By: KAbigail MiyamotoM.D.   On: 06/25/2016 18:17   [place]  Result Date: 06/24/2016 CLINICAL DATA:  Persistent RIGHT upper quadrant pain and leukocytosis. EXAM: [place] - RIGHT UPPER QUADRANT COMPARISON:  Abdominal ultrasound June 23, 2016. FINDINGS: Gallbladder: Gallbladder wall thickening at 5 mm. Pericholecystic fluid. Echogenic sludge in a few punctate suspected gallstones. No sonographic Murphy's sign elicited though, there is general RIGHT upper quadrant pain. Common bile duct: Diameter: 5 mm Liver: No focal lesion identified. Within normal limits in parenchymal echogenicity. Small amount of perihepatic ascites. IMPRESSION: Sonographic findings of acute cholecystitis progressed from 1 day prior, even in the absence of Murphy's sign. New small volume perihepatic ascites. Electronically Signed    By: CElon AlasM.D.   On: 06/24/2016 20:52   Dg Chest Portable 1 View  Result Date: 06/24/2016 CLINICAL DATA:  Worsening abdominal pain EXAM: PORTABLE CHEST 1 VIEW COMPARISON:  06/23/2016 gallbladder ultrasound, CT abdomen from 12/18/2013 and CXR 12/18/2013 FINDINGS: Elevated right hemidiaphragm appears to be a chronic finding. There is atelectasis at the lung bases. No pneumonic consolidation, effusion or pneumothorax.  There is aortic atherosclerosis with slight uncoiling of the aorta. Borderline cardiomegaly. No pneumothorax. No acute nor suspicious osseous abnormalities. IMPRESSION: Chronic elevation of the right hemidiaphragm with right basilar atelectasis. Aortic atherosclerosis. No acute pneumonic consolidation, CHF nor effusion. Electronically Signed   By: Ashley Royalty M.D.   On: 06/24/2016 19:20   Mr Abdomen Mrcp W Wo Contast  Result Date: 06/25/2016 CLINICAL DATA:  Abdominal pain since Tuesday.  Nausea and vomiting. EXAM: MRI ABDOMEN WITHOUT AND WITH CONTRAST (INCLUDING MRCP) TECHNIQUE: Multiplanar multisequence MR imaging of the abdomen was performed both before and after the administration of intravenous contrast. Heavily T2-weighted images of the biliary and pancreatic ducts were obtained, and three-dimensional MRCP images were rendered by post processing. CONTRAST:  1m MULTIHANCE GADOBENATE DIMEGLUMINE 529 MG/ML IV SOLN COMPARISON:  Ultrasounds of 06/24/2016 and 06/23/2016. CT of 12/18/2013. FINDINGS: Mild to moderate motion degradation throughout, especially involving the pre and postcontrast dynamic images. Lower chest: Right hemidiaphragm elevation. Right base airspace disease with small right and trace left pleural effusions. Mild cardiomegaly. Hepatobiliary: No focal liver lesion. Gallbladder sludge and small stones. Gallbladder distension, including at 10 cm on image 25/series 8. No biliary duct dilatation. Right upper quadrant edema, including on image 30/series 8. Normal common  duct caliber, including image 42/series 10. No choledocholithiasis. Pancreas:  Normal pancreas for age, without duct dilatation. Spleen:  Normal in size, without focal abnormality. Adrenals/Urinary Tract: Normal adrenal glands. left renal too small to characterize lesion. 13 mm interpolar right renal cyst. No hydronephrosis. Stomach/Bowel: Tiny hiatal hernia. Normal abdominal small bowel loops. There are colonic diverticula. Hepatic flexure colon is immediately adjacent to the inflammation, including on image 31/series 7. Vascular/Lymphatic: Aortic and branch vessel atherosclerosis. No retroperitoneal or retrocrural adenopathy. Other: Small volume perihepatic ascites is complex, as evidenced by surrounding peritoneal thickening and suggestion of loculation adjacent the right hepatic lobe, including image 99/ series 14003. Musculoskeletal: No acute osseous abnormality. IMPRESSION: 1. Mild to moderate motion degradation. 2. Cholelithiasis with gallbladder distension. Right upper quadrant inflammation, suggesting acute cholecystitis. 3. Complex small volume perihepatic ascites could represent infected ascites. 4. Right upper quadrant edema is immediately adjacent to the hepatic flexure of the colon, with diverticula in this region. A component of diverticulitis cannot be excluded but is felt less likely the cause and cholecystitis. 5. Small right and trace left pleural effusions. Right hemidiaphragm elevation with right base airspace disease, favoring atelectasis. 6. Given the extent of motion on the current exam, follow-up with CT (if symptoms warrant) should be considered. Electronically Signed   By: KAbigail MiyamotoM.D.   On: 06/25/2016 18:17   [place]dom[place] Limited Ruq  Result Date: 06/23/2016 CLINICAL DATA:  Initial evaluation for acute right upper quadrant pain. EXAM: [place] - RIGHT UPPER QUADRANT COMPARISON:  Prior CT from 12/18/2013. FINDINGS: Gallbladder: Stones and sludge present within the  gallbladder lumen. Largest stone measured approximately 5 mm. No free pericholecystic fluid. No abnormal gallbladder wall thickening. No sonographic Murphy sign elicited on exam. Common bile duct: Diameter: 4.2 mm Liver: No focal lesion identified. Within normal limits in parenchymal echogenicity. IMPRESSION: 1. Stones and sludge within the gallbladder lumen. No other sonographic features to suggest acute cholecystitis. 2. No biliary dilatation. Electronically Signed   By: BJeannine BogaM.D.   On: 06/23/2016 22:33    Lab Data:  CBC:  Recent Labs Lab 06/23/16 1834 06/24/16 1815 06/25/16 0603 06/26/16 0332 06/27/16 0337  WBC 11.4* 18.9* 19.6* 22.5* 7.4  NEUTROABS  --  16.4*  --   --   --  HGB 15.2 15.1 14.4 13.7 11.6*  HCT 43.6 42.2 41.2 39.2 33.8*  MCV 94.0 92.5 93.4 93.6 94.4  PLT 136* 126* 133* 200 483   Basic Metabolic Panel:  Recent Labs Lab 06/23/16 1834 06/24/16 1815 06/25/16 0603 06/26/16 0332 06/27/16 0337  NA 130* 128* 132* 132* 135  K 4.0 3.8 4.3 4.0 3.7  CL 97* 95* 98* 101 105  CO2 _0 GLUCOSE 138* 163* 138* 123* 114*  BUN _1 27* 32*  CREATININE 0.90 1.01 1.02 1.00 1.03  CALCIUM 9.3 9.1 9.0 8.5* 8.0*   GFR: Estimated Creatinine Clearance: 66.4 mL/min (by C-G formula based on SCr of 1.03 mg/dL). Liver Function Tests:  Recent Labs Lab 06/23/16 1834 06/24/16 1815 06/25/16 0603 06/27/16 0337  AST _2 46*  ALT _3 41  ALKPHOS 65 67 75 154*  BILITOT 2.9* 4.6* 4.4* 2.5*  PROT 6.6 6.4* 5.8* 4.7*  ALBUMIN 3.8 3.3* 2.8* 2.0*    Recent Labs Lab 06/23/16 1834 06/24/16 1815  LIPASE 25 29   No results for input(s): AMMONIA in the last 168 hours. Coagulation Profile:  Recent Labs Lab 06/25/16 0603 06/26/16 0332  INR 1.57 1.65   Cardiac Enzymes: No results for input(s): CKTOTAL, CKMB, CKMBINDEX, TROPONINI in the last 168 hours. BNP (last 3 results) No results for input(s): PROBNP in the last 8760  hours. HbA1C: No results for input(s): HGBA1C in the last 72 hours. CBG: No results for input(s): GLUCAP in the last 168 hours. Lipid Profile: No results for input(s): CHOL, HDL, LDLCALC, TRIG, CHOLHDL, LDLDIRECT in the last 72 hours. Thyroid Function Tests: No results for input(s): TSH, T4TOTAL, FREET4, T3FREE, THYROIDAB in the last 72 hours. Anemia Panel: No results for input(s): VITAMINB12, FOLATE, FERRITIN, TIBC, IRON, RETICCTPCT in the last 72 hours. Urine analysis:    Component Value Date/Time   COLORURINE AMBER (A) 06/23/2016 1834   APPEARANCEUR HAZY (A) 06/23/2016 1834   LABSPEC 1.015 06/23/2016 1834   PHURINE 5.0 06/23/2016 1834   GLUCOSEU NEGATIVE 06/23/2016 1834   HGBUR NEGATIVE 06/23/2016 1834   BILIRUBINUR NEGATIVE 06/23/2016 1834   KETONESUR NEGATIVE 06/23/2016 1834   PROTEINUR NEGATIVE 06/23/2016 1834   UROBILINOGEN 0.2 12/18/2013 0036   NITRITE NEGATIVE 06/23/2016 1834   LEUKOCYTESUR NEGATIVE 06/23/2016 1834     Riely Baskett M.D. [facility] 06/27/2016, 11:00 AM  Pager: [phone] Between 7am to 7pm - call Pager - [phone]  After 7pm go to [email or web address] - password [email or web address]  Call night coverage person covering after 7pm

## 2016-06-27 NOTE — Progress Notes (Signed)
[facility] Progress Note  Subjective: Patient reports he is feeling "so much better." No N/V. Minimal pain. No BM or flatus. Tolerating clears.   Objective: Vital signs in last 24 hours: Temp:  [97.8 F (36.6 C)-98.6 F (37 C)] 98.3 F (36.8 C) (04/16 0516) Pulse Rate:  [74-87] 77 (04/16 0516) Resp:  [18-23] 19 (04/16 0516) BP: (103-119)/(49-67) 115/67 (04/16 0516) SpO2:  [93 %-95 %] 95 % (04/16 0516) Last BM Date: 06/22/16  Intake/Output from previous day: 04/15 0701 - 04/16 0700 In: 4671.3 [P.O.:630; I.V.:3841.3; IV Piggyback:200] Out: 71 [Urine:1350; Drains:250; Blood:30] Intake/Output this shift: Total I/O In: -  Out: 285 [Urine:275; Drains:10]  PE: General: pleasant, WD, WN white male who is laying in bed in NAD HEENT: head is normocephalic, atraumatic.  Sclera are not injected and not icteric.  PERRL. Mouth is pink and dry. Heart: regular, rate, and rhythm.  Normal s1,s2. No obvious murmurs. Lungs: CTAB, no wheezes, rhonchi, or rales noted.  Respiratory effort nonlabored Abd: soft, expected  Incisional tenderness, distended, normoactive BS. RUQ drain in place with sanguinous fluid in collection bulb (total of 250cc since placement). Incisions clean, dry and intact with wound adhesive.  MS: all 4 extremities are symmetrical with no cyanosis, clubbing, or edema. Skin: warm and dry with no masses, lesions, or rashes Psych: A&Ox3 with an appropriate affect.  Lab Results:   Recent Labs  06/26/16 0332 06/27/16 0337  WBC 22.5* 7.4  HGB 13.7 11.6*  HCT 39.2 33.8*  PLT 200 158   BMET  Recent Labs  06/26/16 0332 06/27/16 0337  NA 132* 135  K 4.0 3.7  CL 101 105  CO2 25 24  GLUCOSE 123* 114*  BUN 27* 32*  CREATININE 1.00 1.03  CALCIUM 8.5* 8.0*   PT/INR  Recent Labs  06/25/16 0603 06/26/16 0332  LABPROT 18.9* 19.7*  INR 1.57 1.65   CMP     Component Value Date/Time   NA 135 06/27/2016 0337   K 3.7 06/27/2016 0337    CL 105 06/27/2016 0337   CO2 24 06/27/2016 0337   GLUCOSE 114 (H) 06/27/2016 0337   BUN 32 (H) 06/27/2016 0337   CREATININE 1.03 06/27/2016 0337   CALCIUM 8.0 (L) 06/27/2016 0337   PROT 4.7 (L) 06/27/2016 0337   ALBUMIN 2.0 (L) 06/27/2016 0337   AST 46 (H) 06/27/2016 0337   ALT 41 06/27/2016 0337   ALKPHOS 154 (H) 06/27/2016 0337   BILITOT 2.5 (H) 06/27/2016 0337   GFRNONAA >60 06/27/2016 0337   GFRAA >60 06/27/2016 0337   Lipase     Component Value Date/Time   LIPASE 29 06/24/2016 1815       Studies/Results: Mr 3d Recon At Scanner  Result Date: 06/25/2016 CLINICAL DATA:  Abdominal pain since Tuesday.  Nausea and vomiting. EXAM: MRI ABDOMEN WITHOUT AND WITH CONTRAST (INCLUDING MRCP) TECHNIQUE: Multiplanar multisequence MR imaging of the abdomen was performed both before and after the administration of intravenous contrast. Heavily T2-weighted images of the biliary and pancreatic ducts were obtained, and three-dimensional MRCP images were rendered by post processing. CONTRAST:  62mL MULTIHANCE GADOBENATE DIMEGLUMINE 529 MG/ML IV SOLN COMPARISON:  Ultrasounds of 06/24/2016 and 06/23/2016. CT of 12/18/2013. FINDINGS: Mild to moderate motion degradation throughout, especially involving the pre and postcontrast dynamic images. Lower chest: Right hemidiaphragm elevation. Right base airspace disease with small right and trace left pleural effusions. Mild cardiomegaly. Hepatobiliary: No focal liver lesion. Gallbladder sludge and small stones. Gallbladder distension, including at  10 cm on image 25/series 8. No biliary duct dilatation. Right upper quadrant edema, including on image 30/series 8. Normal common duct caliber, including image 42/series 10. No choledocholithiasis. Pancreas:  Normal pancreas for age, without duct dilatation. Spleen:  Normal in size, without focal abnormality. Adrenals/Urinary Tract: Normal adrenal glands. left renal too small to characterize lesion. 13 mm interpolar  right renal cyst. No hydronephrosis. Stomach/Bowel: Tiny hiatal hernia. Normal abdominal small bowel loops. There are colonic diverticula. Hepatic flexure colon is immediately adjacent to the inflammation, including on image 31/series 7. Vascular/Lymphatic: Aortic and branch vessel atherosclerosis. No retroperitoneal or retrocrural adenopathy. Other: Small volume perihepatic ascites is complex, as evidenced by surrounding peritoneal thickening and suggestion of loculation adjacent the right hepatic lobe, including image 99/ series 14003. Musculoskeletal: No acute osseous abnormality. IMPRESSION: 1. Mild to moderate motion degradation. 2. Cholelithiasis with gallbladder distension. Right upper quadrant inflammation, suggesting acute cholecystitis. 3. Complex small volume perihepatic ascites could represent infected ascites. 4. Right upper quadrant edema is immediately adjacent to the hepatic flexure of the colon, with diverticula in this region. A component of diverticulitis cannot be excluded but is felt less likely the cause and cholecystitis. 5. Small right and trace left pleural effusions. Right hemidiaphragm elevation with right base airspace disease, favoring atelectasis. 6. Given the extent of motion on the current exam, follow-up with CT (if symptoms warrant) should be considered. Electronically Signed   By: Abigail Miyamoto M.D.   On: 06/25/2016 18:17   Mr Abdomen Mrcp W Wo Contast  Result Date: 06/25/2016 CLINICAL DATA:  Abdominal pain since Tuesday.  Nausea and vomiting. EXAM: MRI ABDOMEN WITHOUT AND WITH CONTRAST (INCLUDING MRCP) TECHNIQUE: Multiplanar multisequence MR imaging of the abdomen was performed both before and after the administration of intravenous contrast. Heavily T2-weighted images of the biliary and pancreatic ducts were obtained, and three-dimensional MRCP images were rendered by post processing. CONTRAST:  63mL MULTIHANCE GADOBENATE DIMEGLUMINE 529 MG/ML IV SOLN COMPARISON:  Ultrasounds  of 06/24/2016 and 06/23/2016. CT of 12/18/2013. FINDINGS: Mild to moderate motion degradation throughout, especially involving the pre and postcontrast dynamic images. Lower chest: Right hemidiaphragm elevation. Right base airspace disease with small right and trace left pleural effusions. Mild cardiomegaly. Hepatobiliary: No focal liver lesion. Gallbladder sludge and small stones. Gallbladder distension, including at 10 cm on image 25/series 8. No biliary duct dilatation. Right upper quadrant edema, including on image 30/series 8. Normal common duct caliber, including image 42/series 10. No choledocholithiasis. Pancreas:  Normal pancreas for age, without duct dilatation. Spleen:  Normal in size, without focal abnormality. Adrenals/Urinary Tract: Normal adrenal glands. left renal too small to characterize lesion. 13 mm interpolar right renal cyst. No hydronephrosis. Stomach/Bowel: Tiny hiatal hernia. Normal abdominal small bowel loops. There are colonic diverticula. Hepatic flexure colon is immediately adjacent to the inflammation, including on image 31/series 7. Vascular/Lymphatic: Aortic and branch vessel atherosclerosis. No retroperitoneal or retrocrural adenopathy. Other: Small volume perihepatic ascites is complex, as evidenced by surrounding peritoneal thickening and suggestion of loculation adjacent the right hepatic lobe, including image 99/ series 14003. Musculoskeletal: No acute osseous abnormality. IMPRESSION: 1. Mild to moderate motion degradation. 2. Cholelithiasis with gallbladder distension. Right upper quadrant inflammation, suggesting acute cholecystitis. 3. Complex small volume perihepatic ascites could represent infected ascites. 4. Right upper quadrant edema is immediately adjacent to the hepatic flexure of the colon, with diverticula in this region. A component of diverticulitis cannot be excluded but is felt less likely the cause and cholecystitis. 5. Small right and trace left  pleural  effusions. Right hemidiaphragm elevation with right base airspace disease, favoring atelectasis. 6. Given the extent of motion on the current exam, follow-up with CT (if symptoms warrant) should be considered. Electronically Signed   By: Abigail Miyamoto M.D.   On: 06/25/2016 18:17    Anti-infectives: Anti-infectives    Start     Dose/Rate Route Frequency Ordered Stop   06/25/16 1145  Ampicillin-Sulbactam (UNASYN) 3 g in sodium chloride 0.9 % 100 mL IVPB     3 g 200 mL/hr over 30 Minutes Intravenous Every 8 hours 06/25/16 1137     06/25/16 0600  cefTRIAXone (ROCEPHIN) 2 g in dextrose 5 % 50 mL IVPB  Status:  Discontinued     2 g 100 mL/hr over 30 Minutes Intravenous Every 24 hours 06/25/16 0043 06/25/16 1122   06/24/16 1900  ampicillin-sulbactam (UNASYN) 1.5 g in sodium chloride 0.9 % 50 mL IVPB     1.5 g 100 mL/hr over 30 Minutes Intravenous  Once 06/24/16 1848 06/24/16 2211       Assessment/Plan  POD #1 s/p laparoscopic cholecystectomy for acute calculous gangrenous cholecystitis. Progressing well postoperatively. Already tolerating a regular diet but is constipated. Encourage ambulation today.  ID/Leukocytosis: resolved. Afebrile. On Unasyn.  FEN: regular diet VTE: SCDs, ambulation and Lovenox From surgical standpoint, anticipate discharge home tomorrow.    LOS: 3 days    LEE Eleonora Peeler, [facility] Surgery 06/27/2016, 11:24 AM

## 2016-06-27 NOTE — Consult Note (Signed)
[facility] CM Primary Care Navigator  06/27/2016  Samuel Little 04/21/1932 [id number]   Met with patient, wife and sonat the bedside to identify possible discharge needs. Patient reports having unbearable right upper quadrant pain (below rib cage) and nausea/ vomiting that had led to this admission/ surgery. Patient endorses Dr. Wenda Little with [facility] as the primary care provider.   Patient shared using [facility] obtain medications without any problem.  Patient states managing hisown medications at home straight out of the containers.   He reports being able to drive prior to this admission. Wife Samuel Little) will be able to provide transportation to his doctors' appointments after discharge.  Patient's wife will be the primary caregiver at home as stated.  Anticipated discharge plan is home with home health services per PT recommendation.    Patient and wife voiced understanding to call primary care provider's office when he gets home, for a post discharge follow-up appointment within a week or sooner if needs arise.Patient letter (with PCP's contact number) was provided as theirreminder.  Patient and wife denied anyfurther needs or concerns at this time.   For questions, please contact:  Samuel Little, BSN, RN- [facility] Primary Care Navigator  Telephone: [phone] [place]

## 2016-06-27 NOTE — Progress Notes (Signed)
Pt was constipated last bowel movement 06/22/16 on call Dr. Barry Dienes ordered Miralax 17 gm daily.

## 2016-06-28 MED ORDER — AMOXICILLIN-POT CLAVULANATE 875-125 MG PO TABS: 1.0000 | ORAL_TABLET | Freq: Two times a day (BID) | ORAL | 0 refills | Status: AC

## 2016-06-28 MED ORDER — PREDNISONE 10 MG PO TABS: ORAL_TABLET | ORAL | 0 refills | Status: DC

## 2016-06-28 MED ORDER — LISINOPRIL 20 MG PO TABS: 20.0000 mg | ORAL_TABLET | Freq: Every day | ORAL | 3 refills | Status: DC

## 2016-06-28 MED ORDER — POLYETHYLENE GLYCOL 3350 17 G PO PACK: 17.0000 g | PACK | Freq: Every day | ORAL | Status: DC | PRN

## 2016-06-28 MED ORDER — COLCHICINE 0.6 MG PO TABS: 0.6000 mg | ORAL_TABLET | Freq: Every day | ORAL | 1 refills | Status: DC | PRN

## 2016-06-28 MED ORDER — SENNOSIDES-DOCUSATE SODIUM 8.6-50 MG PO TABS: 1.0000 | ORAL_TABLET | Freq: Every evening | ORAL | Status: DC | PRN

## 2016-06-28 NOTE — Final Consult Note (Signed)
. Consultant Final Sign-Off Note    Assessment/Final recommendations  Samuel Little is a 82 y.o. male followed by me for s/p laparoscopic cholecystectomy for acute calculous gangrenous cholecystitis   Wound care (if applicable): Keep the dressing on when you shower then change drain dressing daily after you shower. Keep this area clean and dry   Diet at discharge: soft diet for a few days then regular diet   Activity at discharge: as tolerated, avoid heavy lifting (see discharge instructions)   Follow-up appointment:  10-14 days with Dr. Barry Dienes   Pending results:  Unresulted Labs    Start     Ordered   07/04/16 0500  Creatinine, serum  (enoxaparin (LOVENOX)    CrCl >/= 30 ml/min)  Weekly,   R    Comments:  while on enoxaparin therapy   Question:  Specimen collection method  Answer:  Lab=Lab collect   06/27/16 1108       Medication recommendations: Augmentin BID PO for 5 days   Other recommendations: drain care (see discharge instructions)    Thank you for allowing [place] to participate in the care of your patient!  Please consult [place] again if you have further needs for your patient.  Kalman Drape 06/28/2016 9:37 AM    Subjective   Pt states he is feeling fine and had 3 bowel movements. No fever or chills and he is tolerating his diet.  Objective  Vital signs in last 24 hours: Temp:  [97.6 F (36.4 C)-97.8 F (36.6 C)] 97.8 F (36.6 C) (04/17 0600) Pulse Rate:  [56-79] 56 (04/17 0600) Resp:  [20-21] 20 (04/17 0600) BP: (129-134)/(54-84) 130/84 (04/17 0600) SpO2:  [94 %] 94 % (04/17 0600)  General: Physical Exam  Constitutional: He appears well-developed and well-nourished. No distress.  Eyes: Conjunctivae are normal. No scleral icterus.  Cardiovascular: Normal rate, regular rhythm and normal heart sounds.   Pulmonary/Chest: Effort normal and breath sounds normal. No respiratory distress. He has no wheezes. He has no rales.  Abdominal: Soft. Bowel sounds are  normal.  Incisions without surrounding erythema or signs of infection, drain site C/D/I, mild TTP in the RUQ region.  Neurological: He is alert.  Skin: Skin is warm and dry. He is not diaphoretic.  Nursing note and vitals reviewed.    Pertinent labs and Studies:  Recent Labs  06/26/16 0332 06/27/16 0337  WBC 22.5* 7.4  HGB 13.7 11.6*  HCT 39.2 33.8*   BMET  Recent Labs  06/26/16 0332 06/27/16 0337  NA 132* 135  K 4.0 3.7  CL 101 105  CO2 25 24  GLUCOSE 123* 114*  BUN 27* 32*  CREATININE 1.00 1.03  CALCIUM 8.5* 8.0*   No results for input(s): LABURIN in the last 72 hours. Results for orders placed or performed during the hospital encounter of 06/24/16  Culture, blood (single)     Status: None (Preliminary result)   Collection Time: 06/25/16  1:01 AM  Result Value Ref Range Status   Specimen Description BLOOD RIGHT ANTECUBITAL  Final   Special Requests   Final    BOTTLES DRAWN AEROBIC AND ANAEROBIC Blood Culture results may not be optimal due to an excessive volume of blood received in culture bottles   Culture NO GROWTH 2 DAYS  Final   Report Status PENDING  Incomplete  Surgical pcr screen     Status: None   Collection Time: 06/25/16  8:49 PM  Result Value Ref Range Status   MRSA, PCR NEGATIVE  NEGATIVE Final   Staphylococcus aureus NEGATIVE NEGATIVE Final    Comment:        The Xpert SA Assay (FDA approved for NASAL specimens in patients over 71 years of age), is one component of a comprehensive surveillance program.  Test performance has been validated by [facility] for patients greater than or equal to 27 year old. It is not intended to diagnose infection nor to guide or monitor treatment.     Imaging: No results found.  Jackson Latino, [facility] Surgery Pager [phone]

## 2016-06-28 NOTE — Progress Notes (Signed)
Physical Therapy Treatment Patient Details Name: Samuel Little MRN: [id number] DOB: 04-19-1932 Today's Date: 06/28/2016    History of Present Illness Pt s/p laparoscopic cholecystectomy for acute calculous gangrenous cholecystitis. PMH:  hypertension, hypothyroidism, AVNRT status post ablation, gout presented with right upper quadrant pain.    PT Comments    Pt admitted with above diagnosis. Pt currently with functional limitations due to the deficits listed below (see PT Problem List). Pt was able to ambulate around unit with supervision and cues for posture.  Pt also up and down steps with supervision.  Relies on RW for balance.  Continue PT.   Pt will benefit from skilled PT to increase their independence and safety with mobility to allow discharge to the venue listed below.     Follow Up Recommendations  Home health PT;Supervision/Assistance - 24 hour     Equipment Recommendations  None recommended by PT    Recommendations for Other Services       Precautions / Restrictions Precautions Precautions: Fall Restrictions Weight Bearing Restrictions: No    Mobility  Bed Mobility               General bed mobility comments: up in chair on arrival  Transfers Overall transfer level: Needs assistance Equipment used: Rolling walker (2 wheeled) Transfers: Sit to/from Stand Sit to Stand: Supervision         General transfer comment: Pt did not need assist to rise from  chair.  Ambulation/Gait Ambulation/Gait assistance: Supervision Ambulation Distance (Feet): 520 Feet Assistive device: Rolling walker (2 wheeled) Gait Pattern/deviations: Step-through pattern;Decreased stride length;Shuffle;Antalgic;Drifts right/left;Wide base of support;Trunk flexed   Gait velocity interpretation: Below normal speed for age/gender General Gait Details: cued pt to stand tall entire treatment.  Pt tends to flex which worsens as he tires.  Pt steady with RW and does push himself.  Has a  RW he can use at home.   Stairs Stairs: Yes   Stair Management: One rail Right;Step to pattern;Forwards Number of Stairs: 3 General stair comments: No difficulties on steps.  Wheelchair Mobility    Modified [name] (Stroke Patients Only)       Balance Overall balance assessment: Needs assistance;History of Falls Sitting-balance support: No upper extremity supported;Feet supported Sitting balance-Leahy Scale: Fair     Standing balance support: During functional activity;No upper extremity supported Standing balance-Leahy Scale: Fair Standing balance comment: can stand statically without UE support.             High level balance activites: Direction changes;Turns;Sudden stops High Level Balance Comments: supervision for above            Cognition Arousal/Alertness: Awake/alert Behavior During Therapy: WFL for tasks assessed/performed Overall Cognitive Status: Within Functional Limits for tasks assessed                                        Exercises General Exercises - Lower Extremity Ankle Circles/Pumps: AROM;Both;10 reps;Supine Long Arc Quad: AROM;Both;10 reps;Seated Heel Slides: AROM;Both;10 reps;Supine    General Comments        Pertinent Vitals/Pain Pain Assessment: Faces Faces Pain Scale: Hurts little more Pain Location: abdomen, right foot Pain Descriptors / Indicators: Aching Pain Intervention(s): Limited activity within patient's tolerance;Monitored during session;Repositioned    Home Living                      Prior Function  PT Goals (current goals can now be found in the care plan section) Progress towards PT goals: Progressing toward goals    Frequency    Min 3X/week      PT Plan Current plan remains appropriate    Co-evaluation             End of Session Equipment Utilized During Treatment: Gait belt Activity Tolerance: Patient tolerated treatment well;Patient limited by  fatigue Patient left: in chair;with call bell/phone within reach;with chair alarm set;with nursing/sitter in room Nurse Communication: Mobility status PT Visit Diagnosis: Unsteadiness on feet (R26.81);Pain;Muscle weakness (generalized) (M62.81) Pain - Right/Left: Right Pain - part of body: Ankle and joints of foot (abdomen)     Time: 2800-3491 PT Time Calculation (min) (ACUTE ONLY): 9 min  Charges:  $Gait Training: 8-22 mins                    G Codes:       Bereket Gernert,PT Acute Rehabilitation [phone] (pager)    Denice Paradise 06/28/2016, 11:26 AM

## 2016-06-28 NOTE — Care Management Note (Signed)
Case Management Note  Patient Details  Name: Samuel Little MRN: [id number] Date of Birth: 1932-06-08  Subjective/Objective:                    Action/Plan:  Confirmed face sheet information with patient. Patient has a walker at home already. Expected Discharge Date:                  Expected Discharge Plan:  [place]  In-House Referral:     Discharge planning Services  CM Consult  Post Acute Care Choice:  Home Health Choice offered to:  Patient  DME Arranged:    DME Agency:     HH Arranged:  PT [place]:  [place]  Status of Service:  Completed, signed off  If discussed at [place] of Stay Meetings, dates discussed:    Additional Comments:  Marilu Favre, RN 06/28/2016, 8:20 AM

## 2016-06-28 NOTE — Discharge Instructions (Signed)
Please arrive at least 30 min before your appointment to complete your check in paperwork.  If you are unable to arrive 30 min prior to your appointment time we may have to cancel or reschedule you. ° °LAPAROSCOPIC SURGERY: POST OP INSTRUCTIONS  °1. DIET: Follow a light bland diet the first 24 hours after arrival home, such as soup, liquids, crackers, etc. Be sure to include lots of fluids daily. Avoid fast food or heavy meals as your are more likely to get nauseated. Eat a low fat the next few days after surgery.  °2. Take your usually prescribed home medications unless otherwise directed. °3. PAIN CONTROL:  °1. Pain is best controlled by a usual combination of three different methods TOGETHER:  °1. Ice/Heat °2. Over the counter pain medication °3. Prescription pain medication °2. Most patients will experience some swelling and bruising around the incisions. Ice packs or heating pads (30-60 minutes up to 6 times a day) will help. Use ice for the first few days to help decrease swelling and bruising, then switch to heat to help relax tight/sore spots and speed recovery. Some people prefer to use ice alone, heat alone, alternating between ice & heat. Experiment to what works for you. Swelling and bruising can take several weeks to resolve.  °3. It is helpful to take an over-the-counter pain medication regularly for the first few weeks. Choose one of the following that works best for you:  °1. Naproxen (Aleve, etc) Two 220mg tabs twice a day °2. Ibuprofen (Advil, etc) Three 200mg tabs four times a day (every meal & bedtime) °3. Acetaminophen (Tylenol, etc) 500-650mg four times a day (every meal & bedtime) °4. A prescription for pain medication (such as oxycodone, hydrocodone, etc) should be given to you upon discharge. Take your pain medication as prescribed.  °1. If you are having problems/concerns with the prescription medicine (does not control pain, nausea, vomiting, rash, itching, etc), please call us [phone] to see if we need to switch you to a different pain medicine that will work better for you and/or control your side effect better. °2. If you need a refill on your pain medication, please contact your pharmacy. They will contact our office to request authorization. Prescriptions will not be filled after 5 pm or on week-ends. °4. Avoid getting constipated. Between the surgery and the pain medications, it is common to experience some constipation. Increasing fluid intake and taking a fiber supplement (such as Metamucil, Citrucel, FiberCon, MiraLax, etc) 1-2 times a day regularly will usually help prevent this problem from occurring. A mild laxative (prune juice, Milk of Magnesia, MiraLax, etc) should be taken according to package directions if there are no bowel movements after 48 hours.  °5. Watch out for diarrhea. If you have many loose bowel movements, simplify your diet to bland foods & liquids for a few days. Stop any stool softeners and decrease your fiber supplement. Switching to mild anti-diarrheal medications (Kayopectate, Pepto Bismol) can help. If this worsens or does not improve, please call us. °6. Wash / shower every day. You may shower over the dressings as they are waterproof. Continue to shower over incision(s) after the dressing is off. °7. Remove your waterproof bandages 5 days after surgery. You may leave the incision open to air. You may replace a dressing/Band-Aid to cover the incision for comfort if you wish.  °8. ACTIVITIES as tolerated:  °1. You may resume regular (light) daily activities beginning the next day--such as daily self-care, walking, climbing stairs--gradually   increasing activities as tolerated. If you can walk 30 minutes without difficulty, it is safe to try more intense activity such as jogging, treadmill, bicycling, low-impact aerobics, swimming, etc. 2. Save the most intensive and strenuous activity for last such as sit-ups, heavy lifting, contact sports, etc Refrain  from any heavy lifting or straining until you are off narcotics for pain control.  3. DO NOT PUSH THROUGH PAIN. Let pain be your guide: If it hurts to do something, don't do it. Pain is your body warning you to avoid that activity for another week until the pain goes down. 4. You may drive when you are no longer taking prescription pain medication, you can comfortably wear a seatbelt, and you can safely maneuver your car and apply brakes. 5. You may have sexual intercourse when it is comfortable.  9. FOLLOW UP in our office  1. Please call [facility] at [phone] to set up an appointment to see your surgeon in the office for a follow-up appointment approximately 2-3 weeks after your surgery. 2. Make sure that you call for this appointment the day you arrive home to insure a convenient appointment time.      10. IF YOU HAVE DISABILITY OR FAMILY LEAVE FORMS, BRING THEM TO THE               OFFICE FOR PROCESSING.   WHEN TO CALL us [phone]:  1. Poor pain control 2. Reactions / problems with new medications (rash/itching, nausea, etc)  3. Fever over 101.5 F (38.5 C) 4. Inability to urinate 5. Nausea and/or vomiting 6. Worsening swelling or bruising 7. Continued bleeding from incision. 8. Increased pain, redness, or drainage from the incision  The clinic staff is available to answer your questions during regular business hours (8:30am-5pm). Please dont hesitate to call and ask to speak to one of our nurses for clinical concerns.  If you have a medical emergency, go to the nearest emergency room or call 911.  A surgeon from [facility] is always on call at the [facility], [place], [place], [place], [place] [address] ?  MAIN: [phone] ? TOLL FREE: [phone] ?  FAX [phone]  [email or web address]  [facility] Surgical drains are used to remove extra fluid that normally builds up in a surgical  wound after surgery. A surgical drain helps to heal a surgical wound. Different kinds of surgical drains include:  Active drains. These drains use suction to pull drainage away from the surgical wound. Drainage flows through a tube to a container outside of the body. It is important to keep the bulb or the drainage container flat (compressed) at all times, except while you empty it. Flattening the bulb or container creates suction. The two most common types of active drains are bulb drains and Hemovac drains.  Passive drains. These drains allow fluid to drain naturally, by gravity. Drainage flows through a tube to a bandage (dressing) or a container outside of the body. Passive drains do not need to be emptied. The most common type of passive drain is the Penrose drain. A drain is placed during surgery. Immediately after surgery, drainage is usually bright red and a little thicker than water. The drainage may gradually turn yellow or pink and become thinner. It is likely that your health care provider will remove the drain when the drainage stops or when the amount decreases to 1-2 Tbsp (15-30 mL) during a 24-hour  period. How to care for your surgical drain  Keep the skin around the drain dry and covered with a dressing at all times.  Check your drain area every day for signs of infection. Check for:  More redness, swelling, or pain.  Pus or a bad smell.  Cloudy drainage. Follow instructions from your health care provider about how to take care of your drain and how to change your dressing. Change your dressing at least one time every day. Change it more often if needed to keep the dressing dry. Make sure you: 1. Gather your supplies, including:  Tape.  Germ-free cleaning solution (sterile saline).  Split gauze drain sponge: 4 x 4 inches (10 x 10 cm).  Gauze square: 4 x 4 inches (10 x 10 cm). 2. Wash your hands with soap and water before you change your dressing. If soap and water are not  available, use hand sanitizer. 3. Remove the old dressing. Avoid using scissors to do that. 4. Use sterile saline to clean your skin around the drain. 5. Place the tube through the slit in a drain sponge. Place the drain sponge so that it covers your wound. 6. Place the gauze square or another drain sponge on top of the drain sponge that is on the wound. Make sure the tube is between those layers. 7. Tape the dressing to your skin. 8. If you have an active bulb or Hemovac drain, tape the drainage tube to your skin 1-2 inches (2.5-5 cm) below the place where the tube enters your body. Taping keeps the tube from pulling on any stitches (sutures) that you have. 9. Wash your hands with soap and water. 10. Write down the color of your drainage and how often you change your dressing. How to empty your active bulb or Hemovac drain 1. Make sure that you have a measuring cup that you can empty your drainage into. 2. Wash your hands with soap and water. If soap and water are not available, use hand sanitizer. 3. Gently move your fingers down the tube while squeezing very lightly. This is called stripping the tube. This clears any drainage, clots, or tissue from the tube.  Do not pull on the tube.  You may need to strip the tube several times every day to keep the tube clear. 4. Open the bulb cap or the drain plug. Do not touch the inside of the cap or the bottom of the plug. 5. Empty all of the drainage into the measuring cup. 6. Compress the bulb or the container and replace the cap or the plug. To compress the bulb or the container, squeeze it firmly in the middle while you close the cap or plug the container. 7. Write down the amount of drainage that you have in each 24-hour period. If you have less than 2 Tbsp (30 mL) of drainage during 24 hours, contact your health care provider. 8. Flush the drainage down the toilet. 9. Wash your hands with soap and water. Contact a health care provider if:  You  have more redness, swelling, or pain around your drain area.  The amount of drainage that you have is increasing instead of decreasing.  You have pus or a bad smell coming from your drain area.  You have a fever.  You have drainage that is cloudy.  There is a sudden stop or a sudden decrease in the amount of drainage that you have.  Your tube falls out.  Your active draindoes not stay compressedafter  you empty it. This information is not intended to replace advice given to you by your health care provider. Make sure you discuss any questions you have with your health care provider. Document Released: 02/26/2000 Document Revised: 08/06/2015 Document Reviewed: 09/17/2014 Elsevier Interactive Patient Education  2017 [employer].

## 2016-06-28 NOTE — Discharge Summary (Signed)
Physician Discharge Summary   Patient ID: Samuel Little MRN: [id number] DOB/AGE: Sep 17, 1932 83 y.o.  Admit date: 06/24/2016 Discharge date: 06/28/2016  Primary Care Physician:  Wenda Low, MD  Discharge Diagnoses:    . Acute cholecystitis . Hypothyroidism . Essential hypertension . PSVT . Gout Right foot . Hyponatremia . Prolonged QT interval   Consults: Gen. surgery  Recommendations for Outpatient Follow-up:  1. Please repeat CBC/BMET at next visit   DIET: Soft diet for a few days, then advance to regular diet    Allergies:   Allergies  Allergen Reactions  . Celecoxib Nausea And Vomiting  . Meperidine Hcl     REACTION: Nausea  . Nsaids Other (See Comments)    jaundice     DISCHARGE MEDICATIONS: Current Discharge Medication List    START taking these medications   Details  amoxicillin-clavulanate (AUGMENTIN) 875-125 MG tablet Take 1 tablet by mouth every 12 (twelve) hours. Qty: 10 tablet, Refills: 0    lisinopril (PRINIVIL,ZESTRIL) 20 MG tablet Take 1 tablet (20 mg total) by mouth daily. Qty: 30 tablet, Refills: 3    predniSONE (DELTASONE) 10 MG tablet Prednisone dosing: Take  Prednisone '40mg'$  (4 tabs) x 1 days, then taper to '30mg'$  (3 tabs) x 2 days, then '20mg'$  (2 tabs) x 2 days, then '10mg'$  (1 tab) x 2 days, then OFF. Qty: 16 tablet, Refills: 0      CONTINUE these medications which have CHANGED   Details  colchicine 0.6 MG tablet Take 1 tablet (0.6 mg total) by mouth daily as needed (gout). Please take daily for next 5 days until gout flare is resolved, then as needed Qty: 30 tablet, Refills: 1      CONTINUE these medications which have NOT CHANGED   Details  Acetaminophen (TYLENOL ARTHRITIS PAIN PO) Take 650 mg by mouth daily as needed (pain).     allopurinol (ZYLOPRIM) 100 MG tablet Take 100 mg by mouth daily.    Ascorbic Acid (VITAMIN C) 500 MG CAPS Take 500 mg by mouth daily.     aspirin EC 81 MG tablet Take 81 mg by mouth daily.     Bilberry, Vaccinium myrtillus, (BILBERRY PO) Take 1 tablet by mouth daily.     Calcium-Vitamin D (CALTRATE 600 PLUS-VIT D PO) Take 1 tablet by mouth daily.     Cholecalciferol (VITAMIN D) 2000 UNITS tablet Take 2,000 Units by mouth daily.    Cobalamine Combinations (VITAMIN B12-FOLIC ACID PO) Take 1 tablet by mouth daily.     Coenzyme Q10 (CO Q 10 PO) Take 1 tablet by mouth daily.     CORAL CALCIUM PO Take 2 tablets by mouth daily.    CRANBERRY PO Take 1 tablet by mouth daily.     doxazosin (CARDURA) 4 MG tablet Take 4 mg by mouth at bedtime.     GARLIC PO Take 1 tablet by mouth daily.    GINKGO BILOBA PO Take 1 tablet by mouth daily.    Glucos-Chondroit-Hyaluron-MSM (GLUCOSAMINE CHONDROITIN JOINT PO) Take 1 tablet by mouth daily.     HYDROcodone-acetaminophen (NORCO/VICODIN) 5-325 MG tablet Take 1-2 tablets by mouth every 8 (eight) hours as needed for severe pain. Qty: 30 tablet, Refills: 0    KRILL OIL ULTRA STRENGTH PO Take 1 tablet by mouth daily.     levothyroxine (SYNTHROID, LEVOTHROID) 175 MCG tablet Take 175 mcg by mouth daily before breakfast.    Misc Natural Products (BLACK CHERRY CONCENTRATE PO) Take 1 tablet by mouth daily.     multivitamin-lutein (  OCUVITE-LUTEIN) CAPS capsule Take 2 capsules by mouth 2 (two) times daily.     omeprazole (PRILOSEC) 20 MG capsule Take 20 mg by mouth daily as needed (acid reflux).     RESVERATROL PO Take 1 tablet by mouth daily.     senna (SENOKOT) 8.6 MG TABS tablet Take 1 tablet (8.6 mg total) by mouth at bedtime as needed for mild constipation. Qty: 10 each, Refills: 0    TURMERIC PO Take 1 tablet by mouth daily.       STOP taking these medications     lisinopril-hydrochlorothiazide (PRINZIDE,ZESTORETIC) 20-12.5 MG per tablet      traMADol (ULTRAM) 50 MG tablet          Brief H and P: For complete details please refer to admission H and P, but in brief Patient is a 83 year old male with hypertension, hypothyroidism,  AVNRT status post ablation, gout presented with right upper quadrant pain. Ultrasound showed cholelithiasis, 5 mm wall thickening, pericholecystic fluid,progression from one day ago. General surgery was consulted, patient underwent laparoscopic cholecystectomy on 06/26/16.  Hospital Course:   Acute cholecystitis: -Ultrasound showedcholelithiasis, 5 mm wall thickening, pericholecystic fluid,progression from one day ago. General surgery was consulted, patient underwent laparoscopic cholecystectomy on 06/26/16. -  Overnight did not spike any fevers, leukocytosis trending down, 7.4 at the time of discharge. Patient was placed on IV Unasyn. He is not tolerating regular diet. - Dr Maudie Mercury discussed with gastroenterology, MRCP did not show any choledocholithiasis  - Surgery recommended Augmentin for 5 days. Patient was given instructions regarding the drain by general surgery, follow-up with Dr. Barry Dienes in 10-14 days.   Constipation - Resolved, patient was placed also suffers     Hyponatremia: due to pain Improving, 135 at the time of discharge  Long QT interval: - QTC on EKG 4/13 515  - Avoid QT prolonging meds  Hypothyroidism: Continue levothyroxine   Hypertension: HoldingHCTZ , may need to restart  Continue lisinopril, doxazosin  History of AVNRT: Stable   right upper foot pain /Gout: Continue allopurinol - ESR, CRP elevated. Uric acid 4.9 - placed on colchicine, prednisone '40mg'$  x 3 days, then short taper. Allergic to NSAIDS, hence placed on steroids.    Day of Discharge BP 130/84 (BP Location: Left Arm)   Pulse (!) 56   Temp 97.8 F (36.6 C) (Oral)   Resp 20   Ht 6' (1.829 m)   Wt 95.9 kg (211 lb 6.7 oz)   SpO2 94%   BMI 28.67 kg/m   Physical Exam: General: Alert and awake oriented x3 not in any acute distress. HEENT: anicteric sclera, pupils reactive to light and accommodation CVS: S1-S2 clear no murmur rubs or gallops Chest: clear to auscultation  bilaterally, no wheezing rales or rhonchi Abdomen: soft nontender, nondistended, normal bowel sounds Extremities: no cyanosis, clubbing or edema noted bilaterally Neuro: Cranial nerves II-XII intact, no focal neurological deficits   The results of significant diagnostics from this hospitalization (including imaging, microbiology, ancillary and laboratory) are listed below for reference.    LAB RESULTS: Basic Metabolic Panel:  Recent Labs Lab 06/26/16 0332 06/27/16 0337  NA 132* 135  K 4.0 3.7  CL 101 105  CO2 25 24  GLUCOSE 123* 114*  BUN 27* 32*  CREATININE 1.00 1.03  CALCIUM 8.5* 8.0*   Liver Function Tests:  Recent Labs Lab 06/25/16 0603 06/27/16 0337  AST 27 46*  ALT 25 41  ALKPHOS 75 154*  BILITOT 4.4* 2.5*  PROT 5.8* 4.7*  ALBUMIN  2.8* 2.0*    Recent Labs Lab 06/23/16 1834 06/24/16 1815  LIPASE 25 29   No results for input(s): AMMONIA in the last 168 hours. CBC:  Recent Labs Lab 06/24/16 1815  06/26/16 0332 06/27/16 0337  WBC 18.9*  < > 22.5* 7.4  NEUTROABS 16.4*  --   --   --   HGB 15.1  < > 13.7 11.6*  HCT 42.2  < > 39.2 33.8*  MCV 92.5  < > 93.6 94.4  PLT 126*  < > 200 158  < > = values in this interval not displayed. Cardiac Enzymes: No results for input(s): CKTOTAL, CKMB, CKMBINDEX, TROPONINI in the last 168 hours. BNP: Invalid input(s): POCBNP CBG: No results for input(s): GLUCAP in the last 168 hours.  Significant Diagnostic Studies:  Mr 3d Recon At Scanner  Result Date: 06/25/2016 CLINICAL DATA:  Abdominal pain since Tuesday.  Nausea and vomiting. EXAM: MRI ABDOMEN WITHOUT AND WITH CONTRAST (INCLUDING MRCP) TECHNIQUE: Multiplanar multisequence MR imaging of the abdomen was performed both before and after the administration of intravenous contrast. Heavily T2-weighted images of the biliary and pancreatic ducts were obtained, and three-dimensional MRCP images were rendered by post processing. CONTRAST:  9m MULTIHANCE GADOBENATE  DIMEGLUMINE 529 MG/ML IV SOLN COMPARISON:  Ultrasounds of 06/24/2016 and 06/23/2016. CT of 12/18/2013. FINDINGS: Mild to moderate motion degradation throughout, especially involving the pre and postcontrast dynamic images. Lower chest: Right hemidiaphragm elevation. Right base airspace disease with small right and trace left pleural effusions. Mild cardiomegaly. Hepatobiliary: No focal liver lesion. Gallbladder sludge and small stones. Gallbladder distension, including at 10 cm on image 25/series 8. No biliary duct dilatation. Right upper quadrant edema, including on image 30/series 8. Normal common duct caliber, including image 42/series 10. No choledocholithiasis. Pancreas:  Normal pancreas for age, without duct dilatation. Spleen:  Normal in size, without focal abnormality. Adrenals/Urinary Tract: Normal adrenal glands. left renal too small to characterize lesion. 13 mm interpolar right renal cyst. No hydronephrosis. Stomach/Bowel: Tiny hiatal hernia. Normal abdominal small bowel loops. There are colonic diverticula. Hepatic flexure colon is immediately adjacent to the inflammation, including on image 31/series 7. Vascular/Lymphatic: Aortic and branch vessel atherosclerosis. No retroperitoneal or retrocrural adenopathy. Other: Small volume perihepatic ascites is complex, as evidenced by surrounding peritoneal thickening and suggestion of loculation adjacent the right hepatic lobe, including image 99/ series 14003. Musculoskeletal: No acute osseous abnormality. IMPRESSION: 1. Mild to moderate motion degradation. 2. Cholelithiasis with gallbladder distension. Right upper quadrant inflammation, suggesting acute cholecystitis. 3. Complex small volume perihepatic ascites could represent infected ascites. 4. Right upper quadrant edema is immediately adjacent to the hepatic flexure of the colon, with diverticula in this region. A component of diverticulitis cannot be excluded but is felt less likely the cause and  cholecystitis. 5. Small right and trace left pleural effusions. Right hemidiaphragm elevation with right base airspace disease, favoring atelectasis. 6. Given the extent of motion on the current exam, follow-up with CT (if symptoms warrant) should be considered. Electronically Signed   By: KAbigail MiyamotoM.D.   On: 06/25/2016 18:17   [place]domen Limited  Result Date: 06/24/2016 CLINICAL DATA:  Persistent RIGHT upper quadrant pain and leukocytosis. EXAM: [place]DOMEN LIMITED - RIGHT UPPER QUADRANT COMPARISON:  Abdominal ultrasound June 23, 2016. FINDINGS: Gallbladder: Gallbladder wall thickening at 5 mm. Pericholecystic fluid. Echogenic sludge in a few punctate suspected gallstones. No sonographic Murphy's sign elicited though, there is general RIGHT upper quadrant pain. Common bile duct: Diameter: 5 mm Liver: No  focal lesion identified. Within normal limits in parenchymal echogenicity. Small amount of perihepatic ascites. IMPRESSION: Sonographic findings of acute cholecystitis progressed from 1 day prior, even in the absence of Murphy's sign. New small volume perihepatic ascites. Electronically Signed   By: Elon Alas M.D.   On: 06/24/2016 20:52   Dg Chest Portable 1 View  Result Date: 06/24/2016 CLINICAL DATA:  Worsening abdominal pain EXAM: PORTABLE CHEST 1 VIEW COMPARISON:  06/23/2016 gallbladder ultrasound, CT abdomen from 12/18/2013 and CXR 12/18/2013 FINDINGS: Elevated right hemidiaphragm appears to be a chronic finding. There is atelectasis at the lung bases. No pneumonic consolidation, effusion or pneumothorax. There is aortic atherosclerosis with slight uncoiling of the aorta. Borderline cardiomegaly. No pneumothorax. No acute nor suspicious osseous abnormalities. IMPRESSION: Chronic elevation of the right hemidiaphragm with right basilar atelectasis. Aortic atherosclerosis. No acute pneumonic consolidation, CHF nor effusion. Electronically Signed   By: Ashley Royalty M.D.   On: 06/24/2016 19:20    Mr Abdomen Mrcp W Wo Contast  Result Date: 06/25/2016 CLINICAL DATA:  Abdominal pain since Tuesday.  Nausea and vomiting. EXAM: MRI ABDOMEN WITHOUT AND WITH CONTRAST (INCLUDING MRCP) TECHNIQUE: Multiplanar multisequence MR imaging of the abdomen was performed both before and after the administration of intravenous contrast. Heavily T2-weighted images of the biliary and pancreatic ducts were obtained, and three-dimensional MRCP images were rendered by post processing. CONTRAST:  29m MULTIHANCE GADOBENATE DIMEGLUMINE 529 MG/ML IV SOLN COMPARISON:  Ultrasounds of 06/24/2016 and 06/23/2016. CT of 12/18/2013. FINDINGS: Mild to moderate motion degradation throughout, especially involving the pre and postcontrast dynamic images. Lower chest: Right hemidiaphragm elevation. Right base airspace disease with small right and trace left pleural effusions. Mild cardiomegaly. Hepatobiliary: No focal liver lesion. Gallbladder sludge and small stones. Gallbladder distension, including at 10 cm on image 25/series 8. No biliary duct dilatation. Right upper quadrant edema, including on image 30/series 8. Normal common duct caliber, including image 42/series 10. No choledocholithiasis. Pancreas:  Normal pancreas for age, without duct dilatation. Spleen:  Normal in size, without focal abnormality. Adrenals/Urinary Tract: Normal adrenal glands. left renal too small to characterize lesion. 13 mm interpolar right renal cyst. No hydronephrosis. Stomach/Bowel: Tiny hiatal hernia. Normal abdominal small bowel loops. There are colonic diverticula. Hepatic flexure colon is immediately adjacent to the inflammation, including on image 31/series 7. Vascular/Lymphatic: Aortic and branch vessel atherosclerosis. No retroperitoneal or retrocrural adenopathy. Other: Small volume perihepatic ascites is complex, as evidenced by surrounding peritoneal thickening and suggestion of loculation adjacent the right hepatic lobe, including image 99/ series  14003. Musculoskeletal: No acute osseous abnormality. IMPRESSION: 1. Mild to moderate motion degradation. 2. Cholelithiasis with gallbladder distension. Right upper quadrant inflammation, suggesting acute cholecystitis. 3. Complex small volume perihepatic ascites could represent infected ascites. 4. Right upper quadrant edema is immediately adjacent to the hepatic flexure of the colon, with diverticula in this region. A component of diverticulitis cannot be excluded but is felt less likely the cause and cholecystitis. 5. Small right and trace left pleural effusions. Right hemidiaphragm elevation with right base airspace disease, favoring atelectasis. 6. Given the extent of motion on the current exam, follow-up with CT (if symptoms warrant) should be considered. Electronically Signed   By: KAbigail MiyamotoM.D.   On: 06/25/2016 18:17    2D ECHO:   Disposition and Follow-up: Discharge Instructions    Diet - low sodium heart healthy    Complete by:  As directed    Discharge instructions    Complete by:  As directed  Soft diet for a few days   Increase activity slowly    Complete by:  As directed        DISPOSITION: home    DISCHARGE FOLLOW-UP Follow-up Information    BYERLY,FAERA, MD. Schedule an appointment as soon as possible for a visit.   Specialty:  General Surgery Why:  in 10-14 days from your date of discharge for follow up with Dr. Barry Dienes. at this time she will discuss possible removal of your drain Contact information: [address] [place] [address] [phone]        Wenda Low, MD. Schedule an appointment as soon as possible for a visit in 2 week(s).   Specialty:  Internal Medicine Contact information: [address] [place] [address] [phone]            Time spent on Discharge: 22mns   Signed:   REstill CottaM.D. [facility] 06/28/2016, 11:26 AM Pager: [phone]

## 2016-06-30 DIAGNOSIS — M1990 Unspecified osteoarthritis, unspecified site: Secondary | ICD-10-CM | POA: Diagnosis not present

## 2016-06-30 DIAGNOSIS — I10 Essential (primary) hypertension: Secondary | ICD-10-CM | POA: Diagnosis not present

## 2016-06-30 DIAGNOSIS — E039 Hypothyroidism, unspecified: Secondary | ICD-10-CM | POA: Diagnosis not present

## 2016-06-30 DIAGNOSIS — Z7982 Long term (current) use of aspirin: Secondary | ICD-10-CM | POA: Diagnosis not present

## 2016-06-30 DIAGNOSIS — Z48815 Encounter for surgical aftercare following surgery on the digestive system: Secondary | ICD-10-CM | POA: Diagnosis not present

## 2016-06-30 DIAGNOSIS — K219 Gastro-esophageal reflux disease without esophagitis: Secondary | ICD-10-CM | POA: Diagnosis not present

## 2016-06-30 DIAGNOSIS — M10071 Idiopathic gout, right ankle and foot: Secondary | ICD-10-CM | POA: Diagnosis not present

## 2016-06-30 LAB — CULTURE, BLOOD (SINGLE): Culture: NO GROWTH

## 2016-07-04 DIAGNOSIS — K8000 Calculus of gallbladder with acute cholecystitis without obstruction: Secondary | ICD-10-CM | POA: Diagnosis not present

## 2016-07-05 DIAGNOSIS — Z48815 Encounter for surgical aftercare following surgery on the digestive system: Secondary | ICD-10-CM | POA: Diagnosis not present

## 2016-07-05 DIAGNOSIS — K219 Gastro-esophageal reflux disease without esophagitis: Secondary | ICD-10-CM | POA: Diagnosis not present

## 2016-07-05 DIAGNOSIS — I10 Essential (primary) hypertension: Secondary | ICD-10-CM | POA: Diagnosis not present

## 2016-07-05 DIAGNOSIS — M1990 Unspecified osteoarthritis, unspecified site: Secondary | ICD-10-CM | POA: Diagnosis not present

## 2016-07-05 DIAGNOSIS — E039 Hypothyroidism, unspecified: Secondary | ICD-10-CM | POA: Diagnosis not present

## 2016-07-05 DIAGNOSIS — M10071 Idiopathic gout, right ankle and foot: Secondary | ICD-10-CM | POA: Diagnosis not present

## 2016-07-07 DIAGNOSIS — M1990 Unspecified osteoarthritis, unspecified site: Secondary | ICD-10-CM | POA: Diagnosis not present

## 2016-07-07 DIAGNOSIS — K219 Gastro-esophageal reflux disease without esophagitis: Secondary | ICD-10-CM | POA: Diagnosis not present

## 2016-07-07 DIAGNOSIS — I10 Essential (primary) hypertension: Secondary | ICD-10-CM | POA: Diagnosis not present

## 2016-07-07 DIAGNOSIS — E039 Hypothyroidism, unspecified: Secondary | ICD-10-CM | POA: Diagnosis not present

## 2016-07-07 DIAGNOSIS — Z48815 Encounter for surgical aftercare following surgery on the digestive system: Secondary | ICD-10-CM | POA: Diagnosis not present

## 2016-07-07 DIAGNOSIS — M10071 Idiopathic gout, right ankle and foot: Secondary | ICD-10-CM | POA: Diagnosis not present

## 2016-07-11 DIAGNOSIS — E039 Hypothyroidism, unspecified: Secondary | ICD-10-CM | POA: Diagnosis not present

## 2016-07-11 DIAGNOSIS — M10071 Idiopathic gout, right ankle and foot: Secondary | ICD-10-CM | POA: Diagnosis not present

## 2016-07-11 DIAGNOSIS — K219 Gastro-esophageal reflux disease without esophagitis: Secondary | ICD-10-CM | POA: Diagnosis not present

## 2016-07-11 DIAGNOSIS — M1990 Unspecified osteoarthritis, unspecified site: Secondary | ICD-10-CM | POA: Diagnosis not present

## 2016-07-11 DIAGNOSIS — Z48815 Encounter for surgical aftercare following surgery on the digestive system: Secondary | ICD-10-CM | POA: Diagnosis not present

## 2016-07-11 DIAGNOSIS — I10 Essential (primary) hypertension: Secondary | ICD-10-CM | POA: Diagnosis not present

## 2016-07-12 DIAGNOSIS — N182 Chronic kidney disease, stage 2 (mild): Secondary | ICD-10-CM | POA: Diagnosis not present

## 2016-07-12 DIAGNOSIS — Z9049 Acquired absence of other specified parts of digestive tract: Secondary | ICD-10-CM | POA: Diagnosis not present

## 2016-07-12 DIAGNOSIS — K8000 Calculus of gallbladder with acute cholecystitis without obstruction: Secondary | ICD-10-CM | POA: Diagnosis not present

## 2016-07-12 DIAGNOSIS — I10 Essential (primary) hypertension: Secondary | ICD-10-CM | POA: Diagnosis not present

## 2016-07-14 DIAGNOSIS — Z48815 Encounter for surgical aftercare following surgery on the digestive system: Secondary | ICD-10-CM | POA: Diagnosis not present

## 2016-07-14 DIAGNOSIS — E039 Hypothyroidism, unspecified: Secondary | ICD-10-CM | POA: Diagnosis not present

## 2016-07-14 DIAGNOSIS — K219 Gastro-esophageal reflux disease without esophagitis: Secondary | ICD-10-CM | POA: Diagnosis not present

## 2016-07-14 DIAGNOSIS — I10 Essential (primary) hypertension: Secondary | ICD-10-CM | POA: Diagnosis not present

## 2016-07-14 DIAGNOSIS — M10071 Idiopathic gout, right ankle and foot: Secondary | ICD-10-CM | POA: Diagnosis not present

## 2016-07-14 DIAGNOSIS — M1990 Unspecified osteoarthritis, unspecified site: Secondary | ICD-10-CM | POA: Diagnosis not present

## 2016-07-25 DIAGNOSIS — K8000 Calculus of gallbladder with acute cholecystitis without obstruction: Secondary | ICD-10-CM | POA: Diagnosis not present

## 2016-08-17 DIAGNOSIS — Z1283 Encounter for screening for malignant neoplasm of skin: Secondary | ICD-10-CM | POA: Diagnosis not present

## 2016-08-17 DIAGNOSIS — X32XXXD Exposure to sunlight, subsequent encounter: Secondary | ICD-10-CM | POA: Diagnosis not present

## 2016-08-17 DIAGNOSIS — L570 Actinic keratosis: Secondary | ICD-10-CM | POA: Diagnosis not present

## 2016-08-17 DIAGNOSIS — L298 Other pruritus: Secondary | ICD-10-CM | POA: Diagnosis not present

## 2016-08-31 DIAGNOSIS — H04123 Dry eye syndrome of bilateral lacrimal glands: Secondary | ICD-10-CM | POA: Diagnosis not present

## 2016-08-31 DIAGNOSIS — H35033 Hypertensive retinopathy, bilateral: Secondary | ICD-10-CM | POA: Diagnosis not present

## 2016-08-31 DIAGNOSIS — Z961 Presence of intraocular lens: Secondary | ICD-10-CM | POA: Diagnosis not present

## 2016-08-31 DIAGNOSIS — H353132 Nonexudative age-related macular degeneration, bilateral, intermediate dry stage: Secondary | ICD-10-CM | POA: Diagnosis not present

## 2016-10-18 DIAGNOSIS — M25561 Pain in right knee: Secondary | ICD-10-CM | POA: Diagnosis not present

## 2016-10-18 DIAGNOSIS — M25461 Effusion, right knee: Secondary | ICD-10-CM | POA: Diagnosis not present

## 2016-10-18 DIAGNOSIS — M1711 Unilateral primary osteoarthritis, right knee: Secondary | ICD-10-CM | POA: Diagnosis not present

## 2016-10-26 DIAGNOSIS — R7309 Other abnormal glucose: Secondary | ICD-10-CM | POA: Diagnosis not present

## 2016-10-26 DIAGNOSIS — E039 Hypothyroidism, unspecified: Secondary | ICD-10-CM | POA: Diagnosis not present

## 2016-10-26 DIAGNOSIS — I10 Essential (primary) hypertension: Secondary | ICD-10-CM | POA: Diagnosis not present

## 2016-10-26 DIAGNOSIS — D696 Thrombocytopenia, unspecified: Secondary | ICD-10-CM | POA: Diagnosis not present

## 2016-10-26 DIAGNOSIS — M179 Osteoarthritis of knee, unspecified: Secondary | ICD-10-CM | POA: Diagnosis not present

## 2016-10-26 DIAGNOSIS — Z01818 Encounter for other preprocedural examination: Secondary | ICD-10-CM | POA: Diagnosis not present

## 2016-10-26 DIAGNOSIS — M109 Gout, unspecified: Secondary | ICD-10-CM | POA: Diagnosis not present

## 2016-10-26 DIAGNOSIS — K219 Gastro-esophageal reflux disease without esophagitis: Secondary | ICD-10-CM | POA: Diagnosis not present

## 2016-11-28 DIAGNOSIS — R7989 Other specified abnormal findings of blood chemistry: Secondary | ICD-10-CM | POA: Diagnosis not present

## 2016-11-29 DIAGNOSIS — R2231 Localized swelling, mass and lump, right upper limb: Secondary | ICD-10-CM | POA: Diagnosis not present

## 2016-11-29 DIAGNOSIS — M79644 Pain in right finger(s): Secondary | ICD-10-CM | POA: Diagnosis not present

## 2016-12-05 ENCOUNTER — Other Ambulatory Visit: Payer: Self-pay | Admitting: Orthopedic Surgery

## 2016-12-05 DIAGNOSIS — L720 Epidermal cyst: Secondary | ICD-10-CM | POA: Diagnosis not present

## 2016-12-05 SURGERY — Surgical Case
Anesthesia: *Unknown

## 2017-01-03 ENCOUNTER — Other Ambulatory Visit: Payer: Self-pay | Admitting: Orthopedic Surgery

## 2017-01-05 DIAGNOSIS — Z23 Encounter for immunization: Secondary | ICD-10-CM | POA: Diagnosis not present

## 2017-01-18 DIAGNOSIS — E039 Hypothyroidism, unspecified: Secondary | ICD-10-CM | POA: Diagnosis not present

## 2017-01-18 DIAGNOSIS — E782 Mixed hyperlipidemia: Secondary | ICD-10-CM | POA: Diagnosis not present

## 2017-01-18 DIAGNOSIS — N182 Chronic kidney disease, stage 2 (mild): Secondary | ICD-10-CM | POA: Diagnosis not present

## 2017-01-18 DIAGNOSIS — I10 Essential (primary) hypertension: Secondary | ICD-10-CM | POA: Diagnosis not present

## 2017-01-18 DIAGNOSIS — M179 Osteoarthritis of knee, unspecified: Secondary | ICD-10-CM | POA: Diagnosis not present

## 2017-01-18 DIAGNOSIS — M1990 Unspecified osteoarthritis, unspecified site: Secondary | ICD-10-CM | POA: Diagnosis not present

## 2017-01-18 DIAGNOSIS — N400 Enlarged prostate without lower urinary tract symptoms: Secondary | ICD-10-CM | POA: Diagnosis not present

## 2017-01-30 NOTE — Pre-Procedure Instructions (Signed)
Samuel Little  01/30/2017      [id number] - [address], [place] - [place] [address] Phone: [phone] Fax: [phone]    Your procedure is scheduled on Monday, February 13, 2017.  Report to [facility] Admitting at 07:15 A.M.  Call this number if you have problems the morning of surgery:  [phone]   Remember:  Do not eat food or drink liquids after midnight.   Take these medicines the morning of surgery with A SIP OF WATER :  Levothyroxine (Synthroid)  7 days prior to surgery STOP taking any Aspirin (unless otherwise instructed by your surgeon), Aleve, Naproxen, Ibuprofen, Motrin, Advil, Goody's, BC's, ALL HERBAL MEDICATION, FISH OIL, AND ALL VITAMINS.   Do not wear jewelry.  Do not wear lotions, powders, or perfumes, or deodorant.  Do not shave 48 hours prior to surgery.  Men may shave face and neck.  Do not bring valuables to the hospital.   [facility] is not responsible for any belongings or valuables.  Contacts, dentures or bridgework may not be worn into surgery.  Leave your suitcase in the car.  After surgery it may be brought to your room.  For patients admitted to the hospital, discharge time will be determined by your treatment team.  Patients discharged the day of surgery will not be allowed to drive home.   Special instructions:   [facility]- Preparing For Surgery  Before surgery, you can play an important role. Because skin is not sterile, your skin needs to be as free of germs as possible. You can reduce the number of germs on your skin by washing with CHG (chlorahexidine gluconate) Soap before surgery.  CHG is an antiseptic cleaner which kills germs and bonds with the skin to continue killing germs even after washing.  Please do not use if you have an allergy to CHG or antibacterial soaps. If your skin becomes reddened/irritated stop using the CHG.  Do not shave (including legs and  underarms) for at least 48 hours prior to first CHG shower. It is OK to shave your face.  Please follow these instructions carefully.   1. Shower the NIGHT BEFORE SURGERY and the MORNING OF SURGERY with CHG.   2. If you chose to wash your hair, wash your hair first as usual with your normal shampoo.  3. After you shampoo, rinse your hair and body thoroughly to remove the shampoo.  4. Use CHG as you would any other liquid soap. You can apply CHG directly to the skin and wash gently with a scrungie or a clean washcloth.   5. Apply the CHG Soap to your body ONLY FROM THE NECK DOWN.  Do not use on open wounds or open sores. Avoid contact with your eyes, ears, mouth and genitals (private parts). Wash Face and genitals (private parts)  with your normal soap.  6. Wash thoroughly, paying special attention to the area where your surgery will be performed.  7. Thoroughly rinse your body with warm water from the neck down.  8. DO NOT shower/wash with your normal soap after using and rinsing off the CHG Soap.  9. Pat yourself dry with a CLEAN TOWEL.  10. Wear CLEAN PAJAMAS to bed the night before surgery, wear comfortable clothes the morning of surgery  11. Place CLEAN SHEETS on your bed the night of your first shower and DO NOT SLEEP WITH PETS.    Day of Surgery: Do not apply  any deodorants/lotions. Please wear clean clothes to the hospital/surgery center.      Please read over the following fact sheets that you were given. Coughing and Deep Breathing, MRSA Information and Surgical Site Infection Prevention

## 2017-01-31 ENCOUNTER — Encounter (HOSPITAL_COMMUNITY): Payer: Self-pay

## 2017-01-31 ENCOUNTER — Encounter (HOSPITAL_COMMUNITY)
Admission: RE | Admit: 2017-01-31 | Discharge: 2017-01-31 | Disposition: A | Discharge status: Home / Self Care | Payer: Medicare Other | Source: Ambulatory Visit | Attending: Orthopedic Surgery | Admitting: Orthopedic Surgery

## 2017-01-31 ENCOUNTER — Other Ambulatory Visit: Payer: Self-pay

## 2017-01-31 DIAGNOSIS — Z7982 Long term (current) use of aspirin: Secondary | ICD-10-CM | POA: Insufficient documentation

## 2017-01-31 DIAGNOSIS — Z79899 Other long term (current) drug therapy: Secondary | ICD-10-CM | POA: Diagnosis not present

## 2017-01-31 DIAGNOSIS — M109 Gout, unspecified: Secondary | ICD-10-CM | POA: Insufficient documentation

## 2017-01-31 DIAGNOSIS — Z01818 Encounter for other preprocedural examination: Secondary | ICD-10-CM | POA: Insufficient documentation

## 2017-01-31 DIAGNOSIS — J90 Pleural effusion, not elsewhere classified: Secondary | ICD-10-CM | POA: Diagnosis not present

## 2017-01-31 DIAGNOSIS — Z8679 Personal history of other diseases of the circulatory system: Secondary | ICD-10-CM | POA: Diagnosis not present

## 2017-01-31 DIAGNOSIS — I10 Essential (primary) hypertension: Secondary | ICD-10-CM | POA: Diagnosis not present

## 2017-01-31 DIAGNOSIS — Z87442 Personal history of urinary calculi: Secondary | ICD-10-CM | POA: Insufficient documentation

## 2017-01-31 DIAGNOSIS — E039 Hypothyroidism, unspecified: Secondary | ICD-10-CM | POA: Insufficient documentation

## 2017-01-31 DIAGNOSIS — Z9049 Acquired absence of other specified parts of digestive tract: Secondary | ICD-10-CM | POA: Diagnosis not present

## 2017-01-31 DIAGNOSIS — M1990 Unspecified osteoarthritis, unspecified site: Secondary | ICD-10-CM | POA: Diagnosis not present

## 2017-01-31 DIAGNOSIS — K219 Gastro-esophageal reflux disease without esophagitis: Secondary | ICD-10-CM | POA: Diagnosis not present

## 2017-01-31 DIAGNOSIS — Z9889 Other specified postprocedural states: Secondary | ICD-10-CM | POA: Insufficient documentation

## 2017-01-31 HISTORY — DX: Other pericardial effusion (noninflammatory): I31.39

## 2017-01-31 HISTORY — DX: Diverticulosis of intestine, part unspecified, without perforation or abscess without bleeding: K57.90

## 2017-01-31 HISTORY — DX: Pericardial effusion (noninflammatory): I31.3

## 2017-01-31 HISTORY — DX: Unspecified hearing loss, unspecified ear: H91.90

## 2017-01-31 HISTORY — DX: Personal history of urinary calculi: Z87.442

## 2017-01-31 HISTORY — DX: Presence of spectacles and contact lenses: Z97.3

## 2017-01-31 LAB — CBC WITH DIFFERENTIAL/PLATELET
BASOS ABS: 0 10*3/uL (ref 0.0–0.1)
Basophils Relative: 1 %
EOS PCT: 3 %
Eosinophils Absolute: 0.2 10*3/uL (ref 0.0–0.7)
HEMATOCRIT: 42.8 % (ref 39.0–52.0)
Hemoglobin: 14.5 g/dL (ref 13.0–17.0)
LYMPHS PCT: 27 %
Lymphs Abs: 1.6 10*3/uL (ref 0.7–4.0)
MCH: 32.7 pg (ref 26.0–34.0)
MCHC: 33.9 g/dL (ref 30.0–36.0)
MCV: 96.6 fL (ref 78.0–100.0)
MONO ABS: 0.4 10*3/uL (ref 0.1–1.0)
MONOS PCT: 6 %
Neutro Abs: 3.8 10*3/uL (ref 1.7–7.7)
Neutrophils Relative %: 63 %
PLATELETS: 125 10*3/uL — AB (ref 150–400)
RBC: 4.43 MIL/uL (ref 4.22–5.81)
RDW: 14 % (ref 11.5–15.5)
WBC: 5.9 10*3/uL (ref 4.0–10.5)

## 2017-01-31 LAB — COMPREHENSIVE METABOLIC PANEL
ALK PHOS: 87 U/L (ref 38–126)
ALT: 28 U/L (ref 17–63)
AST: 29 U/L (ref 15–41)
Albumin: 3.7 g/dL (ref 3.5–5.0)
Anion gap: 7 (ref 5–15)
BILIRUBIN TOTAL: 2 mg/dL — AB (ref 0.3–1.2)
BUN: 12 mg/dL (ref 6–20)
CALCIUM: 9.3 mg/dL (ref 8.9–10.3)
CO2: 24 mmol/L (ref 22–32)
Chloride: 109 mmol/L (ref 101–111)
Creatinine, Ser: 0.93 mg/dL (ref 0.61–1.24)
GFR calc Af Amer: >60 mL/min (ref >60)
Glucose, Bld: 106 mg/dL — ABNORMAL HIGH (ref 65–99)
POTASSIUM: 4 mmol/L (ref 3.5–5.1)
Sodium: 140 mmol/L (ref 135–145)
TOTAL PROTEIN: 6.3 g/dL — AB (ref 6.5–8.1)

## 2017-01-31 LAB — SURGICAL PCR SCREEN
MRSA, PCR: NEGATIVE
Staphylococcus aureus: NEGATIVE

## 2017-01-31 NOTE — Progress Notes (Signed)
PCP: Dr Pilar Plate every 6 months, last Aug 2018 Cardiologist: Dr. Tana Conch, last saw 2 years ago.  Pt reports cardiologist only wanted to see if PCP wanted further eval  EKG: 06/24/16 ECHO: 12/2009 Cardiac Cath: 2007  Patient denies shortness of breath, fever, cough, and chest pain at PAT appointment.  Patient verbalized understanding of instructions provided today at the PAT appointment.  Patient asked to review instructions at home and day of surgery.

## 2017-02-01 NOTE — Progress Notes (Addendum)
Anesthesia Chart Review: Patient is a 83 year old male scheduled for right TKA on 02/13/17 by Dr. Vickey Huger. Case is posted for spinal anesthesia.  History includes never smoker, HTN, AVNRT (s/p radiofrequency catheter ablation, Dr. 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   - PCP is Dr. Wenda Low. Reports last visit 10/2016. - Cardiologist is Dr. Candee Furbish. Last visit 05/20/15 with PRN follow-up recommended. He signed a note of cardiac clearance for orthopedic surgery in August 2018 (scanned under Media tab).  Meds include ASA 81 mg (to hold unless otherwise instructed by surgeon), levothyroxine, lisinopril, doxazosin, fish oil. He is on multiple vitamins and herbal supplements (to hold 7 days prior to surgery)  BP (!) 154/79   Pulse 75   Temp 36.6 C   Resp 20   Ht 6\' 1"  (1.854 m)   Wt 206 lb 4.8 oz (93.6 kg)   SpO2 95%   BMI 27.22 kg/m   EKG 06/24/16: ST at 100 bpm, atrial premature complex, right BBB. 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 QT 399 ms, QTc 515 ms on 06/24/16 tracing, so meds that prolong QT interval were avoided acute cholecystitis admission.  Echo 12/17/09 [facility]): Conclusions: 1. Normal LV size and function. EF 55%. 2. Mild left atrial enlargement. 3. Mild mitral valve regurgitation. 4. There is mild aortic root dilatation. 4.7 cm 5. No pericardial effusion. (10/16/09 echo showed aortic root 43 mm. 10/16/09 chest CT showed: minimal atheromatous vascular calcification with normal caliber thoracoabdominal aorta.)  Nuclear stress test 03/31/09: Impression: The post stress  myocardial perfusion images show a normal pattern of perfusion in all regions. The post stress left ventricle is normal in size. The post stress left ventricular function is normal. The post stress ejection fraction is 59%. No regional wall motion abnormalities. Maximum effort nondiagnostic ETT due to resting baseline abnormal EKG. Exercise capacity of 7.0 METS. Exercise capacity is normal. Normal myocardial perfusion study.  Cardiac cath 05/11/05: Impression: 1. Intact left ventricular size and global systolic function. 2. No significant coronary artery disease. Luminal irregularities is only seen. 3. Normal systolic blood pressure. 4. Normal left ventricular end-diastolic pressure.  Preoperative labs noted. Total bilirubin remains elevated at 2.0, but appears chronically elevated (was > 4 at the time of acute cholecystitis 06/2016). AST/ALT have normalized. Glucose 106. H/H 14.5/42.8, PLT 125K.  I have reached out to Dr. Marlou Porch to clarify any follow-up recommendations for dilated aortic root by echo but normal thoracoabdominal aorta by CT. Chart will be left for follow-up.  George Hugh [facility] Short Stay Center/Anesthesiology Phone [phone] 02/02/2017 5:48 AM  Addendum: Dr. Marlou Porch ordered a CTA of the chest to re-evaluate aortic root dilation seen on previous echo. Ascending thoracic aorta measured 4.3 cm with one year follow-up recommended (results below). Okay for two year follow-up per Dr. Marlou Porch.  CTA chest 02/07/17: IMPRESSION: 1. 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 The ascending thoracic aorta shows mild dilatation and measures 4.3 cm in greatest diameter. Recommend annual imaging followup by CTA or MRA. This recommendation follows 2010 ACCF/AHA/AATS/ACR/ASA/SCA/SCAI/SIR/STS/SVM Guidelines for the Diagnosis and Management of Patients with Thoracic Aortic Disease. Circulation. 2010; 121: W237-S283 2. Stable 4 mm calcified right upper  lobe  granuloma. 3. Bilateral nonobstructing upper pole renal calculi visualized including an upper pole calculus on the right measuring up to 13 mm in diameter. Aortic aneurysm NOS (ICD10-I71.9). Report reviewed by Dr. Marlou Porch who wrote, "CT scan shows stable aortic ascending root diameter of 4.3 cm. Reassuring. No major change in diameter since 2011. I think would be reasonable to repeat CT scan in 2 years. He may proceed with surgery."  George Hugh [facility] Short Stay Center/Anesthesiology Phone [phone] 02/08/2017 9:08 AM

## 2017-02-06 ENCOUNTER — Telehealth: Payer: Self-pay | Admitting: Cardiology

## 2017-02-06 DIAGNOSIS — I77819 Aortic ectasia, unspecified site: Secondary | ICD-10-CM

## 2017-02-06 NOTE — Telephone Encounter (Signed)
Called and spoke to patient and his wife and made them aware of Dr. Marlou Porch recommendations to have CT of the chest to evaluate aorta prior to having his knee surgery on 12/3. Made them aware that they would be contacted about scheduling. They verbalized understanding. Recent CMET on file. Order placed. Spoke with Marzetta Board in [place] who is going to work on getting patient scheduled. Message sent to pre-cert.

## 2017-02-06 NOTE — Telephone Encounter (Signed)
Patient scheduled 11/27 at 3:30 PM.

## 2017-02-06 NOTE — Telephone Encounter (Addendum)
Please order a CT of chest to evaluate aorta. Thanks for the notification. Please see below for details.   Candee Furbish, MD   ----- Message from Jacinta Shoe, PA-C sent at 02/02/2017  5:18 AM EST ----- Regarding: dilated aortic root, OR 12/03 Dr. Marlou Porch,  Hi, I am a physician assistant with anesthesia at [facility]. Samuel Little is scheduled for right TKA with Dr. Ronnie Derby on 02/13/17. 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 His PCP is Dr. Lysle Rubens at [place].  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 Previously, aortic root 43 mm by 10/2009 echo and noted to have normal caliber thoracoabdominal aorta on 10/2009 chest CT with contrast.These studies were done when he was seeing Dr. Rodell Perna. Would any additional follow-up be warranted, and if so, would it be needed prior to his 02/13/17 surgery? If you prefer this be addressed by Dr. Lysle Rubens then please let me (or my associate Willeen Cass, NP) know.  Thanks, George Hugh [facility] Stay [facility]/Anesthesiology Office Phone [phone] 02/02/2017 5:35 AM

## 2017-02-07 ENCOUNTER — Ambulatory Visit (INDEPENDENT_AMBULATORY_CARE_PROVIDER_SITE_OTHER)
Admission: RE | Admit: 2017-02-07 | Discharge: 2017-02-07 | Disposition: A | Discharge status: Home / Self Care | Payer: Medicare Other | Source: Ambulatory Visit | Attending: Cardiology | Admitting: Cardiology

## 2017-02-07 DIAGNOSIS — I719 Aortic aneurysm of unspecified site, without rupture: Secondary | ICD-10-CM | POA: Diagnosis not present

## 2017-02-07 DIAGNOSIS — I77819 Aortic ectasia, unspecified site: Secondary | ICD-10-CM

## 2017-02-07 MED ORDER — IOPAMIDOL (ISOVUE-370) INJECTION 76%
100.0000 mL | Freq: Once | INTRAVENOUS | Status: AC | PRN
  Administered 2017-02-07: 100 mL via INTRAVENOUS

## 2017-02-09 ENCOUNTER — Telehealth: Payer: Self-pay | Admitting: Cardiology

## 2017-02-09 NOTE — Telephone Encounter (Signed)
Spoke with both patient (who is HOH) and wife (at pt's request) .  Reviewed results of testing and they are aware OK for pt to proceed with surgery as scheduled.

## 2017-02-09 NOTE — Telephone Encounter (Signed)
Notes recorded by Jerline Pain, MD on 02/07/2017 at 5:50 PM EST CT scan shows stable aortic ascending root diameter of 4.3 cm. Reassuring. No major change in diameter since 2011. I think would be reasonable to repeat CT scan in 2 years. He may proceed with surgery. Candee Furbish, MD

## 2017-02-09 NOTE — Telephone Encounter (Signed)
Mrs. Volkert calling in reference to CT Scan. She is confused and has some questions.

## 2017-02-10 MED ORDER — BUPIVACAINE LIPOSOME 1.3 % IJ SUSP
20.0000 mL | INTRAMUSCULAR | Status: AC
  Administered 2017-02-13: 20 mL
  Filled 2017-02-10: qty 20

## 2017-02-10 MED ORDER — TRANEXAMIC ACID 1000 MG/10ML IV SOLN
1000.0000 mg | INTRAVENOUS | Status: AC
  Administered 2017-02-13: 1000 mg via INTRAVENOUS
  Filled 2017-02-10: qty 1100

## 2017-02-12 NOTE — Anesthesia Preprocedure Evaluation (Addendum)
Anesthesia Evaluation  Patient identified by MRN, date of birth, ID band Patient awake    Reviewed: Allergy & Precautions, H&P , NPO status , Patient's Chart, lab work & pertinent test results, reviewed documented beta blocker date and time   Airway Mallampati: II  TM Distance: >3 FB Neck ROM: Limited    Dental no notable dental hx. (+) Dental Advisory Given, Chipped,    Pulmonary    Pulmonary exam normal breath sounds clear to auscultation       Cardiovascular hypertension, Pt. on medications Normal cardiovascular exam+ dysrhythmias  Rhythm:regular Rate:Normal     Neuro/Psych    GI/Hepatic GERD  Medicated,  Endo/Other  Hypothyroidism   Renal/GU      Musculoskeletal  (+) Arthritis ,   Abdominal   Peds  Hematology   Anesthesia Other Findings  EKG 06/24/16: ST at 100 bpm, atrial premature complex, right BBB. 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 QT 399 ms, QTc 515 ms on 06/24/16 tracing, so meds that prolong QT interval were avoided acute cholecystitis admission.  Echo 12/17/09 [facility]): Conclusions: 1. Normal LV size and function. EF 55%. 2. Mild left atrial enlargement. 3. Mild mitral valve regurgitation. 4. There is mild aortic root dilatation. 4.7 cm 5. No pericardial effusion. (10/16/09 echo showed aortic root 43 mm. 10/16/09 chest CT showed: minimal atheromatous vascular calcification with normal caliber thoracoabdominal aorta.)  Nuclear stress test 03/31/09: Impression: The post stress myocardial perfusion images show a normal pattern of perfusion in all regions. The post stress left ventricle is normal in size. The post stress left ventricular function is normal. The post stress ejection fraction is 59%. No regional wall motion abnormalities. Maximum effort nondiagnostic ETT due to resting baseline abnormal EKG. Exercise capacity of 7.0  METS. Exercise capacity is normal. Normal myocardial perfusion study.  Cardiac cath 05/11/05: Impression: 1. Intact left ventricular size and global systolic function. 2. No significant coronary artery disease. Luminal irregularities is only seen. 3. Normal systolic blood pressure. 4. Normal left ventricular end-diastolic pressure.     Reproductive/Obstetrics                            Anesthesia Physical  Anesthesia Plan  ASA: III  Anesthesia Plan: Spinal   Post-op Pain Management:  Regional for Post-op pain   Induction:   PONV Risk Score and Plan: 1 and Treatment may vary due to age or medical condition, Ondansetron and Dexamethasone  Airway Management Planned: Mask  Additional Equipment:   Intra-op Plan:   Post-operative Plan: Extubation in OR  Informed Consent: I have reviewed the patients History and Physical, chart, labs and discussed the procedure including the risks, benefits and alternatives for the proposed anesthesia with the patient or authorized representative who has indicated his/her understanding and acceptance.   Dental Advisory Given  Plan Discussed with: CRNA, Surgeon and Anesthesiologist  Anesthesia Plan Comments: (  )       Anesthesia Quick Evaluation

## 2017-02-13 ENCOUNTER — Ambulatory Visit (HOSPITAL_COMMUNITY): Payer: Medicare Other | Admitting: Anesthesiology

## 2017-02-13 ENCOUNTER — Ambulatory Visit (HOSPITAL_COMMUNITY): Payer: Medicare Other | Admitting: Vascular Surgery

## 2017-02-13 ENCOUNTER — Encounter (HOSPITAL_COMMUNITY): Payer: Self-pay | Admitting: *Deleted

## 2017-02-13 ENCOUNTER — Encounter (HOSPITAL_COMMUNITY)
Admission: RE | Disposition: A | Discharge status: Home / Self Care | Payer: Self-pay | Source: Ambulatory Visit | Attending: Orthopedic Surgery

## 2017-02-13 ENCOUNTER — Observation Stay (HOSPITAL_COMMUNITY)
Admission: RE | Admit: 2017-02-13 | Discharge: 2017-02-14 | Disposition: A | Discharge status: Home / Self Care | Payer: Medicare Other | Source: Ambulatory Visit | Attending: Orthopedic Surgery | Admitting: Orthopedic Surgery

## 2017-02-13 DIAGNOSIS — M109 Gout, unspecified: Secondary | ICD-10-CM | POA: Diagnosis not present

## 2017-02-13 DIAGNOSIS — Z7982 Long term (current) use of aspirin: Secondary | ICD-10-CM | POA: Insufficient documentation

## 2017-02-13 DIAGNOSIS — E039 Hypothyroidism, unspecified: Secondary | ICD-10-CM | POA: Insufficient documentation

## 2017-02-13 DIAGNOSIS — H3530 Unspecified macular degeneration: Secondary | ICD-10-CM | POA: Insufficient documentation

## 2017-02-13 DIAGNOSIS — M1990 Unspecified osteoarthritis, unspecified site: Secondary | ICD-10-CM | POA: Insufficient documentation

## 2017-02-13 DIAGNOSIS — M1711 Unilateral primary osteoarthritis, right knee: Secondary | ICD-10-CM | POA: Diagnosis not present

## 2017-02-13 DIAGNOSIS — N200 Calculus of kidney: Secondary | ICD-10-CM | POA: Insufficient documentation

## 2017-02-13 DIAGNOSIS — N183 Chronic kidney disease, stage 3 (moderate): Secondary | ICD-10-CM | POA: Diagnosis not present

## 2017-02-13 DIAGNOSIS — I4510 Unspecified right bundle-branch block: Secondary | ICD-10-CM | POA: Insufficient documentation

## 2017-02-13 DIAGNOSIS — K219 Gastro-esophageal reflux disease without esophagitis: Secondary | ICD-10-CM | POA: Diagnosis not present

## 2017-02-13 DIAGNOSIS — E871 Hypo-osmolality and hyponatremia: Secondary | ICD-10-CM | POA: Insufficient documentation

## 2017-02-13 DIAGNOSIS — I129 Hypertensive chronic kidney disease with stage 1 through stage 4 chronic kidney disease, or unspecified chronic kidney disease: Secondary | ICD-10-CM | POA: Insufficient documentation

## 2017-02-13 DIAGNOSIS — J8410 Pulmonary fibrosis, unspecified: Secondary | ICD-10-CM | POA: Diagnosis not present

## 2017-02-13 DIAGNOSIS — J90 Pleural effusion, not elsewhere classified: Secondary | ICD-10-CM | POA: Diagnosis not present

## 2017-02-13 DIAGNOSIS — Z79899 Other long term (current) drug therapy: Secondary | ICD-10-CM | POA: Insufficient documentation

## 2017-02-13 DIAGNOSIS — Z96659 Presence of unspecified artificial knee joint: Secondary | ICD-10-CM

## 2017-02-13 DIAGNOSIS — Z87442 Personal history of urinary calculi: Secondary | ICD-10-CM | POA: Diagnosis not present

## 2017-02-13 DIAGNOSIS — I120 Hypertensive chronic kidney disease with stage 5 chronic kidney disease or end stage renal disease: Secondary | ICD-10-CM | POA: Diagnosis not present

## 2017-02-13 HISTORY — PX: TOTAL KNEE ARTHROPLASTY: SHX125

## 2017-02-13 SURGERY — ARTHROPLASTY, KNEE, TOTAL
Anesthesia: Spinal | Laterality: Right

## 2017-02-13 MED ORDER — ZOLPIDEM TARTRATE 5 MG PO TABS: 5.0000 mg | ORAL_TABLET | Freq: Every evening | ORAL | Status: DC | PRN

## 2017-02-13 MED ORDER — LIDOCAINE 2% (20 MG/ML) 5 ML SYRINGE
INTRAMUSCULAR | Status: AC
  Filled 2017-02-13: qty 5

## 2017-02-13 MED ORDER — SODIUM CHLORIDE 0.9 % IR SOLN
Status: DC | PRN
  Administered 2017-02-13: 3000 mL

## 2017-02-13 MED ORDER — ONDANSETRON HCL 4 MG/2ML IJ SOLN
INTRAMUSCULAR | Status: AC
  Filled 2017-02-13: qty 2

## 2017-02-13 MED ORDER — ASPIRIN EC 325 MG PO TBEC
325.0000 mg | DELAYED_RELEASE_TABLET | Freq: Two times a day (BID) | ORAL | Status: DC
  Administered 2017-02-13 – 2017-02-14 (×2): 325 mg via ORAL
  Filled 2017-02-13 (×2): qty 1

## 2017-02-13 MED ORDER — ONDANSETRON HCL 4 MG/2ML IJ SOLN: 4.0000 mg | Freq: Four times a day (QID) | INTRAMUSCULAR | Status: DC | PRN

## 2017-02-13 MED ORDER — MIDAZOLAM HCL 2 MG/2ML IJ SOLN
INTRAMUSCULAR | Status: AC
  Administered 2017-02-13: 1 mg via INTRAVENOUS
  Filled 2017-02-13: qty 2

## 2017-02-13 MED ORDER — OXYCODONE HCL 5 MG PO TABS
ORAL_TABLET | ORAL | Status: AC
  Filled 2017-02-13: qty 1

## 2017-02-13 MED ORDER — TRANEXAMIC ACID 1000 MG/10ML IV SOLN
1000.0000 mg | Freq: Once | INTRAVENOUS | Status: AC
  Administered 2017-02-13: 1000 mg via INTRAVENOUS
  Filled 2017-02-13: qty 10

## 2017-02-13 MED ORDER — MIDAZOLAM HCL 2 MG/2ML IJ SOLN
1.0000 mg | Freq: Once | INTRAMUSCULAR | Status: AC
  Administered 2017-02-13: 1 mg via INTRAVENOUS
  Filled 2017-02-13: qty 1

## 2017-02-13 MED ORDER — ALUM & MAG HYDROXIDE-SIMETH 200-200-20 MG/5ML PO SUSP: 30.0000 mL | ORAL | Status: DC | PRN

## 2017-02-13 MED ORDER — FLEET ENEMA 7-19 GM/118ML RE ENEM: 1.0000 | ENEMA | Freq: Once | RECTAL | Status: DC | PRN

## 2017-02-13 MED ORDER — METHOCARBAMOL 500 MG PO TABS
ORAL_TABLET | ORAL | Status: AC
  Filled 2017-02-13: qty 1

## 2017-02-13 MED ORDER — CHLORHEXIDINE GLUCONATE 4 % EX LIQD: 60.0000 mL | Freq: Once | CUTANEOUS | Status: DC

## 2017-02-13 MED ORDER — FENTANYL CITRATE (PF) 100 MCG/2ML IJ SOLN
50.0000 ug | Freq: Once | INTRAMUSCULAR | Status: AC
  Administered 2017-02-13: 50 ug via INTRAVENOUS
  Filled 2017-02-13: qty 1

## 2017-02-13 MED ORDER — GABAPENTIN 300 MG PO CAPS
ORAL_CAPSULE | ORAL | Status: AC
Start: 2017-02-13 — End: 2017-02-13
  Administered 2017-02-13: 300 mg via ORAL
  Filled 2017-02-13: qty 1

## 2017-02-13 MED ORDER — ACETAMINOPHEN 500 MG PO TABS
1000.0000 mg | ORAL_TABLET | Freq: Once | ORAL | Status: AC
  Administered 2017-02-13: 1000 mg via ORAL

## 2017-02-13 MED ORDER — METOCLOPRAMIDE HCL 5 MG PO TABS: 5.0000 mg | ORAL_TABLET | Freq: Three times a day (TID) | ORAL | Status: DC | PRN

## 2017-02-13 MED ORDER — MENTHOL 3 MG MT LOZG: 1.0000 | LOZENGE | OROMUCOSAL | Status: DC | PRN

## 2017-02-13 MED ORDER — LACTATED RINGERS IV SOLN
INTRAVENOUS | Status: DC
  Administered 2017-02-13 (×2): via INTRAVENOUS

## 2017-02-13 MED ORDER — OXYCODONE HCL 5 MG PO TABS
5.0000 mg | ORAL_TABLET | ORAL | Status: DC | PRN
  Administered 2017-02-13 – 2017-02-14 (×2): 5 mg via ORAL
  Filled 2017-02-13: qty 1

## 2017-02-13 MED ORDER — GABAPENTIN 300 MG PO CAPS
300.0000 mg | ORAL_CAPSULE | Freq: Once | ORAL | Status: AC
  Administered 2017-02-13: 300 mg via ORAL

## 2017-02-13 MED ORDER — DEXAMETHASONE SODIUM PHOSPHATE 10 MG/ML IJ SOLN
INTRAMUSCULAR | Status: AC
  Filled 2017-02-13: qty 1

## 2017-02-13 MED ORDER — CEFAZOLIN SODIUM-DEXTROSE 2-4 GM/100ML-% IV SOLN
2.0000 g | INTRAVENOUS | Status: AC
  Administered 2017-02-13: 2 g via INTRAVENOUS

## 2017-02-13 MED ORDER — DOCUSATE SODIUM 100 MG PO CAPS
100.0000 mg | ORAL_CAPSULE | Freq: Two times a day (BID) | ORAL | Status: DC
  Administered 2017-02-13 – 2017-02-14 (×2): 100 mg via ORAL
  Filled 2017-02-13 (×2): qty 1

## 2017-02-13 MED ORDER — DEXAMETHASONE SODIUM PHOSPHATE 10 MG/ML IJ SOLN
8.0000 mg | Freq: Once | INTRAMUSCULAR | Status: AC
  Administered 2017-02-13: 5 mg via INTRAVENOUS

## 2017-02-13 MED ORDER — ONDANSETRON HCL 4 MG PO TABS: 4.0000 mg | ORAL_TABLET | Freq: Four times a day (QID) | ORAL | Status: DC | PRN

## 2017-02-13 MED ORDER — LIDOCAINE HCL (CARDIAC) 20 MG/ML IV SOLN
INTRAVENOUS | Status: DC | PRN
  Administered 2017-02-13: 60 mg via INTRAVENOUS

## 2017-02-13 MED ORDER — ACETAMINOPHEN 500 MG PO TABS
ORAL_TABLET | ORAL | Status: AC
  Administered 2017-02-13: 1000 mg via ORAL
  Filled 2017-02-13: qty 2

## 2017-02-13 MED ORDER — PROPOFOL 500 MG/50ML IV EMUL
INTRAVENOUS | Status: DC | PRN
  Administered 2017-02-13: 70 ug/kg/min via INTRAVENOUS

## 2017-02-13 MED ORDER — SODIUM CHLORIDE 0.9 % IJ SOLN
INTRAMUSCULAR | Status: DC | PRN
Start: 2017-02-13 — End: 2017-02-13
  Administered 2017-02-13: 20 mL

## 2017-02-13 MED ORDER — HYDROMORPHONE HCL 1 MG/ML IJ SOLN: 1.0000 mg | INTRAMUSCULAR | Status: DC | PRN

## 2017-02-13 MED ORDER — METHOCARBAMOL 1000 MG/10ML IJ SOLN
500.0000 mg | Freq: Four times a day (QID) | INTRAVENOUS | Status: DC | PRN
  Filled 2017-02-13: qty 5

## 2017-02-13 MED ORDER — GABAPENTIN 300 MG PO CAPS
300.0000 mg | ORAL_CAPSULE | Freq: Three times a day (TID) | ORAL | Status: DC
  Administered 2017-02-13 – 2017-02-14 (×3): 300 mg via ORAL
  Filled 2017-02-13 (×3): qty 1

## 2017-02-13 MED ORDER — ACETAMINOPHEN 650 MG RE SUPP: 650.0000 mg | RECTAL | Status: DC | PRN

## 2017-02-13 MED ORDER — CEFAZOLIN SODIUM-DEXTROSE 2-4 GM/100ML-% IV SOLN
INTRAVENOUS | Status: AC
  Filled 2017-02-13: qty 100

## 2017-02-13 MED ORDER — LISINOPRIL 20 MG PO TABS
20.0000 mg | ORAL_TABLET | Freq: Every day | ORAL | Status: DC
  Administered 2017-02-13 – 2017-02-14 (×2): 20 mg via ORAL
  Filled 2017-02-13 (×2): qty 1

## 2017-02-13 MED ORDER — BUPIVACAINE-EPINEPHRINE (PF) 0.25% -1:200000 IJ SOLN
INTRAMUSCULAR | Status: DC | PRN
  Administered 2017-02-13: 30 mL

## 2017-02-13 MED ORDER — HYDROCODONE-ACETAMINOPHEN 7.5-325 MG PO TABS
1.0000 | ORAL_TABLET | Freq: Four times a day (QID) | ORAL | Status: DC
  Administered 2017-02-13 – 2017-02-14 (×2): 1 via ORAL
  Filled 2017-02-13 (×2): qty 1

## 2017-02-13 MED ORDER — FENTANYL CITRATE (PF) 100 MCG/2ML IJ SOLN
INTRAMUSCULAR | Status: AC
  Administered 2017-02-13: 50 ug via INTRAVENOUS
  Filled 2017-02-13: qty 2

## 2017-02-13 MED ORDER — EPHEDRINE 5 MG/ML INJ
INTRAVENOUS | Status: AC
  Filled 2017-02-13: qty 10

## 2017-02-13 MED ORDER — SENNOSIDES-DOCUSATE SODIUM 8.6-50 MG PO TABS: 1.0000 | ORAL_TABLET | Freq: Every evening | ORAL | Status: DC | PRN

## 2017-02-13 MED ORDER — DEXAMETHASONE SODIUM PHOSPHATE 10 MG/ML IJ SOLN
10.0000 mg | Freq: Once | INTRAMUSCULAR | Status: AC
  Administered 2017-02-14: 10 mg via INTRAVENOUS
  Filled 2017-02-13: qty 1

## 2017-02-13 MED ORDER — ACETAMINOPHEN 500 MG PO TABS
1000.0000 mg | ORAL_TABLET | Freq: Four times a day (QID) | ORAL | Status: AC
  Administered 2017-02-13 – 2017-02-14 (×4): 1000 mg via ORAL
  Filled 2017-02-13 (×4): qty 2

## 2017-02-13 MED ORDER — LEVOTHYROXINE SODIUM 175 MCG PO TABS
175.0000 ug | ORAL_TABLET | Freq: Every day | ORAL | Status: DC
  Administered 2017-02-14: 175 ug via ORAL
  Filled 2017-02-13: qty 1

## 2017-02-13 MED ORDER — ACETAMINOPHEN 325 MG PO TABS: 650.0000 mg | ORAL_TABLET | ORAL | Status: DC | PRN

## 2017-02-13 MED ORDER — METHOCARBAMOL 500 MG PO TABS
500.0000 mg | ORAL_TABLET | Freq: Four times a day (QID) | ORAL | Status: DC | PRN
Start: 2017-02-13 — End: 2017-02-14
  Administered 2017-02-13 – 2017-02-14 (×3): 500 mg via ORAL
  Filled 2017-02-13 (×2): qty 1

## 2017-02-13 MED ORDER — PHENOL 1.4 % MT LIQD: 1.0000 | OROMUCOSAL | Status: DC | PRN

## 2017-02-13 MED ORDER — CEFAZOLIN SODIUM-DEXTROSE 1-4 GM/50ML-% IV SOLN
1.0000 g | Freq: Four times a day (QID) | INTRAVENOUS | Status: AC
  Administered 2017-02-13 (×2): 1 g via INTRAVENOUS
  Filled 2017-02-13 (×2): qty 50

## 2017-02-13 MED ORDER — METOCLOPRAMIDE HCL 5 MG/ML IJ SOLN: 5.0000 mg | Freq: Three times a day (TID) | INTRAMUSCULAR | Status: DC | PRN

## 2017-02-13 MED ORDER — FENTANYL CITRATE (PF) 100 MCG/2ML IJ SOLN: 25.0000 ug | INTRAMUSCULAR | Status: DC | PRN

## 2017-02-13 MED ORDER — BISACODYL 5 MG PO TBEC: 5.0000 mg | DELAYED_RELEASE_TABLET | Freq: Every day | ORAL | Status: DC | PRN

## 2017-02-13 MED ORDER — EPHEDRINE SULFATE 50 MG/ML IJ SOLN
INTRAMUSCULAR | Status: DC | PRN
  Administered 2017-02-13: 5 mg via INTRAVENOUS
  Administered 2017-02-13: 7.5 mg via INTRAVENOUS

## 2017-02-13 SURGICAL SUPPLY — 60 items
BANDAGE ACE 6X5 VEL STRL LF (GAUZE/BANDAGES/DRESSINGS) ×3 IMPLANT
BANDAGE ESMARK 6X9 LF (GAUZE/BANDAGES/DRESSINGS) ×1 IMPLANT
BLADE SAGITTAL 13X1.27X60 (BLADE) ×2 IMPLANT
BLADE SAGITTAL 13X1.27X60MM (BLADE) ×1
BLADE SAW SGTL 83.5X18.5 (BLADE) ×3 IMPLANT
BLADE SURG 10 STRL SS (BLADE) ×3 IMPLANT
BNDG ESMARK 6X9 LF (GAUZE/BANDAGES/DRESSINGS) ×3
BOWL SMART MIX CTS (DISPOSABLE) ×3 IMPLANT
CAPT KNEE TOTAL 3 ×3 IMPLANT
CEMENT BONE SIMPLEX SPEEDSET (Cement) ×6 IMPLANT
CLOSURE WOUND 1/2 X4 (GAUZE/BANDAGES/DRESSINGS) ×1
COVER SURGICAL LIGHT HANDLE (MISCELLANEOUS) ×3 IMPLANT
CUFF TOURNIQUET SINGLE 34IN LL (TOURNIQUET CUFF) ×3 IMPLANT
DRAPE EXTREMITY T 121X128X90 (DRAPE) ×3 IMPLANT
DRAPE HALF SHEET 40X57 (DRAPES) ×3 IMPLANT
DRAPE INCISE IOBAN 66X45 STRL (DRAPES) ×6 IMPLANT
DRAPE U-SHAPE 47X51 STRL (DRAPES) ×3 IMPLANT
DRSG AQUACEL AG ADV 3.5X10 (GAUZE/BANDAGES/DRESSINGS) ×3 IMPLANT
DURAPREP 26ML APPLICATOR (WOUND CARE) ×6 IMPLANT
ELECT REM PT RETURN 9FT ADLT (ELECTROSURGICAL) ×3
ELECTRODE REM PT RTRN 9FT ADLT (ELECTROSURGICAL) ×1 IMPLANT
FILTER STRAW FLUID ASPIR (MISCELLANEOUS) IMPLANT
GLOVE BIOGEL M 7.0 STRL (GLOVE) IMPLANT
GLOVE BIOGEL PI IND STRL 7.5 (GLOVE) IMPLANT
GLOVE BIOGEL PI IND STRL 8.5 (GLOVE) ×1 IMPLANT
GLOVE BIOGEL PI INDICATOR 7.5 (GLOVE)
GLOVE BIOGEL PI INDICATOR 8.5 (GLOVE) ×2
GLOVE SURG ORTHO 8.0 STRL STRW (GLOVE) ×6 IMPLANT
GOWN STRL REUS W/ TWL LRG LVL3 (GOWN DISPOSABLE) ×1 IMPLANT
GOWN STRL REUS W/ TWL XL LVL3 (GOWN DISPOSABLE) ×2 IMPLANT
GOWN STRL REUS W/TWL 2XL LVL3 (GOWN DISPOSABLE) ×3 IMPLANT
GOWN STRL REUS W/TWL LRG LVL3 (GOWN DISPOSABLE) ×2
GOWN STRL REUS W/TWL XL LVL3 (GOWN DISPOSABLE) ×4
HANDPIECE INTERPULSE COAX TIP (DISPOSABLE) ×2
HOOD PEEL AWAY FACE SHEILD DIS (HOOD) ×9 IMPLANT
KIT BASIN OR (CUSTOM PROCEDURE TRAY) ×3 IMPLANT
KIT ROOM TURNOVER OR (KITS) ×3 IMPLANT
KNEE CAPITATED TOTAL 3 ×1 IMPLANT
MANIFOLD NEPTUNE II (INSTRUMENTS) ×3 IMPLANT
NEEDLE 18GX1X1/2 (RX/OR ONLY) (NEEDLE) IMPLANT
NEEDLE 22X1 1/2 (OR ONLY) (NEEDLE) ×6 IMPLANT
NS IRRIG 1000ML POUR BTL (IV SOLUTION) ×3 IMPLANT
PACK TOTAL JOINT (CUSTOM PROCEDURE TRAY) ×3 IMPLANT
PAD ARMBOARD 7.5X6 YLW CONV (MISCELLANEOUS) ×6 IMPLANT
SET HNDPC FAN SPRY TIP SCT (DISPOSABLE) ×1 IMPLANT
STRIP CLOSURE SKIN 1/2X4 (GAUZE/BANDAGES/DRESSINGS) ×2 IMPLANT
SUCTION FRAZIER HANDLE 10FR (MISCELLANEOUS)
SUCTION TUBE FRAZIER 10FR DISP (MISCELLANEOUS) IMPLANT
SUT MNCRL AB 3-0 PS2 18 (SUTURE) ×3 IMPLANT
SUT VIC AB 0 CTB1 27 (SUTURE) ×6 IMPLANT
SUT VIC AB 1 CT1 27 (SUTURE) ×4
SUT VIC AB 1 CT1 27XBRD ANBCTR (SUTURE) ×2 IMPLANT
SUT VIC AB 2-0 CT1 27 (SUTURE) ×4
SUT VIC AB 2-0 CT1 TAPERPNT 27 (SUTURE) ×2 IMPLANT
SYR 20CC LL (SYRINGE) ×6 IMPLANT
SYR TB 1ML LUER SLIP (SYRINGE) IMPLANT
TOWEL OR 17X24 6PK STRL BLUE (TOWEL DISPOSABLE) ×3 IMPLANT
TOWEL OR 17X26 10 PK STRL BLUE (TOWEL DISPOSABLE) ×3 IMPLANT
TRAY CATH 16FR W/PLASTIC CATH (SET/KITS/TRAYS/PACK) IMPLANT
WRAP KNEE MAXI GEL POST OP (GAUZE/BANDAGES/DRESSINGS) ×3 IMPLANT

## 2017-02-13 NOTE — Transfer of Care (Signed)
Immediate Anesthesia Transfer of Care Note  Patient: Samuel Little  Procedure(s) Performed: TOTAL KNEE ARTHROPLASTY (Right )  Patient Location: PACU  Anesthesia Type:MAC and Spinal  Level of Consciousness: awake, oriented, patient cooperative and responds to stimulation  Airway & Oxygen Therapy: Patient Spontanous Breathing  Post-op Assessment: Report given to RN and Post -op Vital signs reviewed and stable  Post vital signs: Reviewed and stable  Last Vitals:  Vitals:   02/13/17 0840 02/13/17 0843  BP: (!) 162/64 (!) 162/64  Pulse: (!) 52 (!) 53  Resp: 19 19  Temp:    SpO2: 94% 94%    Last Pain:  Vitals:   02/13/17 0843  TempSrc:   PainSc: 0-No pain         Complications: No apparent anesthesia complications

## 2017-02-13 NOTE — Progress Notes (Signed)
Orthopedic Tech Progress Note Patient Details:  DEAKON FRIX May 30, 1932 [id number]  CPM Right Knee CPM Right Knee: On Right Knee Flexion (Degrees): 90 Right Knee Extension (Degrees): 0 Additional Comments: Right Knee (bone foam zero degree provided at bedside).  Right knee(Right Knee (bone foam zero degree provided at bedside).  Right knee)  Post Interventions Patient Tolerated: Well Instructions Provided: Adjustment of device, Care of device  Kristopher Oppenheim 02/13/2017, 12:27 PM

## 2017-02-13 NOTE — Anesthesia Procedure Notes (Signed)
Spinal  Patient location during procedure: OR Staffing Anesthesiologist: Lyndle Herrlich, MD Spinal Block Patient position: sitting Prep: DuraPrep Patient monitoring: heart rate, blood pressure and continuous pulse ox Approach: right paramedian Location: L3-4 Injection technique: single-shot Needle Needle type: Sprotte  Needle gauge: 24 G Needle length: 9 cm Assessment Sensory level: T4 Additional Notes Spinal Dosage in OR  .75% Bupivicaine ml       1.9

## 2017-02-13 NOTE — Evaluation (Addendum)
Physical Therapy Evaluation Patient Details Name: Samuel Little MRN: [id number] DOB: Oct 28, 1932 Today's Date: 02/13/2017   History of Present Illness  Patient is an 83 yo male s/p R TKA  Clinical Impression  Orders received for PT evaluation. Patient demonstrates deficits in functional mobility as indicated below. Will benefit from continued skilled PT to address deficits and maximize function. Will see as indicated and progress as tolerated.      Follow Up Recommendations DC plan and follow up therapy as arranged by surgeon    Equipment Recommendations  None recommended by PT    Recommendations for Other Services       Precautions / Restrictions Precautions Precautions: Knee Restrictions Weight Bearing Restrictions: Yes RLE Weight Bearing: Weight bearing as tolerated      Mobility  Bed Mobility Overal bed mobility: Needs Assistance Bed Mobility: Supine to Sit     Supine to sit: Supervision     General bed mobility comments: No physical assist required, supervision for safety  Transfers Overall transfer level: Needs assistance Equipment used: Rolling walker (2 wheeled) Transfers: Sit to/from Stand Sit to Stand: Supervision         General transfer comment: no physical assist required, supervision for safety RW for UE support. VCs for hand placement and postioning prior to elevation to upright  Ambulation/Gait Ambulation/Gait assistance: Min guard Ambulation Distance (Feet): 18 Feet Assistive device: Rolling walker (2 wheeled) Gait Pattern/deviations: Antalgic;Step-through pattern Gait velocity: decreased Gait velocity interpretation: Below normal speed for age/gender General Gait Details: increased pain with mobility, steady with use of RW. No evidence of buckling  Stairs            Wheelchair Mobility    Modified [name] (Stroke Patients Only)       Balance Overall balance assessment: Needs assistance Sitting-balance support: Feet  supported Sitting balance-Leahy Scale: Good       Standing balance-Leahy Scale: Fair Standing balance comment: use of RW for upright support                             Pertinent Vitals/Pain Pain Assessment: 0-10 Pain Score: 3  Pain Location: anterior right knee Pain Descriptors / Indicators: Sore Pain Intervention(s): Monitored during session    Home Living Family/patient expects to be discharged to:: Private residence Living Arrangements: Spouse/significant other Available Help at Discharge: Family;Available 24 hours/day Type of Home: House Home Access: Stairs to enter   [employer] of Steps: 1 Home Layout: One level(2 steps to change levels) Home Equipment: Walker - 2 wheels;Bedside commode;Shower seat;Grab bars - tub/shower      Prior Function Level of Independence: Independent               Hand Dominance   Dominant Hand: Right    Extremity/Trunk Assessment   Upper Extremity Assessment Upper Extremity Assessment: Overall WFL for tasks assessed    Lower Extremity Assessment Lower Extremity Assessment: RLE deficits/detail RLE: Unable to fully assess due to pain       Communication   Communication: HOH  Cognition Arousal/Alertness: Awake/alert Behavior During Therapy: WFL for tasks assessed/performed Overall Cognitive Status: Within Functional Limits for tasks assessed                                        General Comments      Exercises     Assessment/Plan  PT Assessment Patient needs continued PT services  PT Problem List Decreased strength;Decreased activity tolerance;Decreased balance;Decreased mobility;Pain       PT Treatment Interventions DME instruction;Gait training;Stair training;Functional mobility training;Therapeutic activities;Therapeutic exercise;Balance training;Patient/family education;Modalities    PT Goals (Current goals can be found in the Care Plan section)  Acute Rehab PT  Goals Patient Stated Goal: to go home PT Goal Formulation: With patient Time For Goal Achievement: 02/27/17 Potential to Achieve Goals: Good    Frequency 7X/week   Barriers to discharge        Co-evaluation               AM-PAC PT "6 Clicks" Daily Activity  Outcome Measure Difficulty turning over in bed (including adjusting bedclothes, sheets and blankets)?: A Little Difficulty moving from lying on back to sitting on the side of the bed? : A Little Difficulty sitting down on and standing up from a chair with arms (e.g., wheelchair, bedside commode, etc,.)?: A Little Help needed moving to and from a bed to chair (including a wheelchair)?: A Little Help needed walking in hospital room?: A Little Help needed climbing 3-5 steps with a railing? : A Little 6 Click Score: 18    End of Session Equipment Utilized During Treatment: Gait belt Activity Tolerance: Patient tolerated treatment well Patient left: in chair;with call bell/phone within reach Nurse Communication: Mobility status PT Visit Diagnosis: Difficulty in walking, not elsewhere classified (R26.2)    Time: 4431-5400 PT Time Calculation (min) (ACUTE ONLY): 20 min   Charges:   PT Evaluation $PT Eval Moderate Complexity: 1 Mod     PT G Codes:        Alben Deeds, PT DPT  Board Certified Neurologic Specialist [place] 03/04/2017, 3:24 PM      04-Mar-2017 1425  PT G-Codes **NOT FOR INPATIENT CLASS**  Functional Assessment Tool Used Clinical judgement  Functional Limitation Mobility: Walking and moving around  Mobility: Walking and Moving Around Current Status [phone]) CJ  Mobility: Walking and Moving Around Goal Status [phone]) CI

## 2017-02-13 NOTE — Op Note (Signed)
TOTAL KNEE REPLACEMENT OPERATIVE NOTE:  02/13/2017  2:32 PM  PATIENT:  Samuel Little  83 y.o. male  PRE-OPERATIVE DIAGNOSIS:  primary osteoarthritis right knee  POST-OPERATIVE DIAGNOSIS:  primary osteoarthritis right knee  PROCEDURE:  Procedure(s): TOTAL KNEE ARTHROPLASTY  SURGEON:  Surgeon(s): Vickey Huger, MD  PHYSICIAN ASSISTANT: Carlyon Shadow, [facility]   ANESTHESIA:   spinal  DRAINS: Hemovac  SPECIMEN: None  COUNTS:  Correct  TOURNIQUET:   Total Tourniquet Time Documented: Thigh (Right) - 41 minutes Total: Thigh (Right) - 41 minutes   DICTATION:  Indication for procedure:    The patient is a 83 y.o. male who has failed conservative treatment for primary osteoarthritis right knee.  Informed consent was obtained prior to anesthesia. The risks versus benefits of the operation were explain and in a way the patient can, and did, understand.   On the implant demand matching protocol, this patient scored 10.  Therefore, this patient was not receive a polyethylene insert with vitamin E which is a high demand implant.  Description of procedure:     The patient was taken to the operating room and placed under anesthesia.  The patient was positioned in the usual fashion taking care that all body parts were adequately padded and/or protected.  I foley catheter was not placed.  A tourniquet was applied and the leg prepped and draped in the usual sterile fashion.  The extremity was exsanguinated with the esmarch and tourniquet inflated to 350 mmHg.  Pre-operative range of motion was normal.  The knee was in 6 degree of mild varus.  A midline incision approximately 6-7 inches long was made with a #10 blade.  A new blade was used to make a parapatellar arthrotomy going 2-3 cm into the quadriceps tendon, over the patella, and alongside the medial aspect of the patellar tendon.  A synovectomy was then performed with the #10 blade and forceps. I then elevated the deep MCL off the medial  tibial metaphysis subperiosteally around to the semimembranosus attachment.    I everted the patella and used calipers to measure patellar thickness.  I used the reamer to ream down to appropriate thickness to recreate the native thickness.  I then removed excess bone with the rongeur and sagittal saw.  I used the appropriately sized template and drilled the three lug holes.  I then put the trial in place and measured the thickness with the calipers to ensure recreation of the native thickness.  The trial was then removed and the patella subluxed and the knee brought into flexion.  A homan retractor was place to retract and protect the patella and lateral structures.  A Z-retractor was place medially to protect the medial structures.  The extra-medullary alignment system was used to make cut the tibial articular surface perpendicular to the anamotic axis of the tibia and in 3 degrees of posterior slope.  The cut surface and alignment jig was removed.  I then used the intramedullary alignment guide to make a 6 valgus cut on the distal femur.  I then marked out the epicondylar axis on the distal femur.  The posterior condylar axis measured 3 degrees.  I then used the anterior referencing sizer and measured the femur to be a size 11.  The 4-In-1 cutting block was screwed into place in external rotation matching the posterior condylar angle, making our cuts perpendicular to the epicondylar axis.  Anterior, posterior and chamfer cuts were made with the sagittal saw.  The cutting block and cut pieces  were removed.  A lamina spreader was placed in 90 degrees of flexion.  The ACL, PCL, menisci, and posterior condylar osteophytes were removed.  A 11 mm spacer blocked was found to offer good flexion and extension gap balance after mild in degree releasing.   The scoop retractor was then placed and the femoral finishing block was pinned in place.  The small sagittal saw was used as well as the lug drill to finish the  femur.  The block and cut surfaces were removed and the medullary canal hole filled with autograft bone from the cut pieces.  The tibia was delivered forward in deep flexion and external rotation.  A size G tray was selected and pinned into place centered on the medial 1/3 of the tibial tubercle.  The reamer and keel was used to prepare the tibia through the tray.    I then trialed with the size 11 femur, size G tibia, a 11 mm insert and the 35 patella.  I had excellent flexion/extension gap balance, excellent patella tracking.  Flexion was full and beyond 120 degrees; extension was zero.  These components were chosen and the staff opened them to me on the back table while the knee was lavaged copiously and the cement mixed.  The soft tissue was infiltrated with 60cc of exparel 1.3% through a 21 gauge needle.  I cemented in the components and removed all excess cement.  The polyethylene tibial component was snapped into place and the knee placed in extension while cement was hardening.  The capsule was infilltrated with 30cc of .25% Marcaine with epinephrine.  A hemovac was place in the joint exiting superolaterally.  A pain pump was place superomedially superficial to the arthrotomy.  Once the cement was hard, the tourniquet was let down.  Hemostasis was obtained.  The arthrotomy was closed with figure-8 #1 vicryl sutures.  The deep soft tissues were closed with #0 vicryls and the subcuticular layer closed with a running #2-0 vicryl.  The skin was reapproximated and closed with skin staples.  The wound was dressed with xeroform, 4 x4's, 2 ABD sponges, a single layer of webril and a TED stocking.   The patient was then awakened, extubated, and taken to the recovery room in stable condition.  BLOOD LOSS:  300cc DRAINS: 1 hemovac, 1 pain catheter COMPLICATIONS:  None.  PLAN OF CARE: Admit for overnight observation  PATIENT DISPOSITION:  PACU - hemodynamically stable.   Delay start of Pharmacological  VTE agent (>24hrs) due to surgical blood loss or risk of bleeding:  not applicable  Please fax a copy of this op note to my office at [phone] (please only include page 1 and 2 of the Case Information op note)

## 2017-02-13 NOTE — Anesthesia Procedure Notes (Signed)
Procedure Name: MAC Date/Time: 02/13/2017 10:10 AM Performed by: Oletta Lamas, CRNA Pre-anesthesia Checklist: Patient identified, Emergency Drugs available, Suction available, Patient being monitored and Timeout performed Patient Re-evaluated:Patient Re-evaluated prior to induction Oxygen Delivery Method: Simple face mask

## 2017-02-13 NOTE — H&P (Signed)
Samuel Little MRN:  [id number] DOB/SEX:  02/12/1933/male  CHIEF COMPLAINT:  Painful right Knee  HISTORY: Patient is a 83 y.o. male presented with a history of pain in the right knee. Onset of symptoms was gradual starting a few years ago with gradually worsening course since that time. Patient has been treated conservatively with over-the-counter NSAIDs and activity modification. Patient currently rates pain in the knee at 10 out of 10 with activity. There is pain at night.  PAST MEDICAL HISTORY: Patient Active Problem List   Diagnosis Date Noted  . Hyponatremia 06/25/2016  . Prolonged QT interval 06/25/2016  . Acute cholecystitis 06/24/2016  . Diverticulitis of cecum 12/20/2013  . BPH (benign prostatic hyperplasia) 12/20/2013  . Bacteremia due to Escherichia coli 12/19/2013  . Gout 12/18/2013  . Sepsis ([place]) 12/18/2013  . CKD (chronic kidney disease), stage III ([place]) 12/18/2013  . PSVT 04/23/2009  . Hypothyroidism 04/22/2009  . Essential hypertension 04/22/2009  . GERD 04/22/2009  . Arthropathy 04/22/2009   Past Medical History:  Diagnosis Date  . 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 Arthritis   . Diverticulosis   . Effusion, pericardium   . Gastroesophageal reflux disease   . Gout   . Hard of hearing    wears bilateral hearing aids  . History of kidney stones   . Hypertension   . Hypothyroidism   . Macular degeneration   . Pleural effusion, left   . Right bundle branch block   . Thyroid disease    hypothyroidism  . Wears glasses    Past Surgical History:  Procedure Laterality Date  . ARTHROSCOPY KNEE W/ DRILLING Right   . CARDIAC CATHETERIZATION  2007  . CARDIAC ELECTROPHYSIOLOGY STUDY AND ABLATION    . CATARACT EXTRACTION    . CHEST TUBE INSERTION    . CHOLECYSTECTOMY  06/26/2016   Procedure: LAPAROSCOPIC CHOLECYSTECTOMY  subtotal;  Surgeon: Stark Klein, MD;  Location: [place];  Service: General;;  . FRACTURE SURGERY Right 2001   right leg  . HERNIA REPAIR    . MASS EXCISION Right 09/22/2015   Procedure: RIGHT THUMB EXCISION MASS;  Surgeon: Leanora Cover, MD;  Location: [place];  Service: Orthopedics;  Laterality: Right;  . ROTATOR CUFF REPAIR    . ROTATOR CUFF REPAIR Right 2002  . TONSILLECTOMY       MEDICATIONS:   No medications prior to admission.    ALLERGIES:   Allergies  Allergen Reactions  . Nsaids Other (See Comments)    JAUNDICE  . Celecoxib Nausea And Vomiting  . Meperidine Hcl Nausea Only    REVIEW OF SYSTEMS:  A comprehensive review of systems was negative except for: Musculoskeletal: positive for arthralgias and bone pain   FAMILY HISTORY:   Family History  Problem Relation Age of Onset  . Hypertension Mother   . Diabetes Mother   . Hypertension Father   . Heart attack Father   . Diabetes Father   . Heart attack Brother   . Heart attack Paternal Uncle   . Heart attack Paternal Uncle   . Heart attack Paternal Uncle   . Other Son        Leak in aorta    SOCIAL HISTORY:   Social History   Tobacco Use  . Smoking status: Never Smoker  . Smokeless tobacco: Never Used  Substance Use Topics  . Alcohol use: No  EXAMINATION:  Vital signs in last 24 hours:    There were no vitals taken for this visit.  General Appearance:    Alert, cooperative, no distress, appears stated age  Head:    Normocephalic, without obvious abnormality, atraumatic  Eyes:    PERRL, conjunctiva/corneas clear, EOM's intact, fundi    benign, both eyes       Ears:    Normal TM's and external ear canals, both ears  Nose:   Nares normal, septum midline, mucosa normal, no drainage    or sinus tenderness  Throat:   Lips, mucosa, and tongue normal; teeth and gums normal  Neck:   Supple, symmetrical, trachea midline, no adenopathy;       thyroid:  No enlargement/tenderness/nodules; no carotid   bruit or JVD  Back:     Symmetric, no  curvature, ROM normal, no CVA tenderness  Lungs:     Clear to auscultation bilaterally, respirations unlabored  Chest wall:    No tenderness or deformity  Heart:    Regular rate and rhythm, S1 and S2 normal, no murmur, rub   or gallop  Abdomen:     Soft, non-tender, bowel sounds active all four quadrants,    no masses, no organomegaly  Genitalia:    Normal male without lesion, discharge or tenderness  Rectal:    Normal tone, normal prostate, no masses or tenderness;   guaiac negative stool  Extremities:   Extremities normal, atraumatic, no cyanosis or edema  Pulses:   2+ and symmetric all extremities  Skin:   Skin color, texture, turgor normal, no rashes or lesions  Lymph nodes:   Cervical, supraclavicular, and axillary nodes normal  Neurologic:   CNII-XII intact. Normal strength, sensation and reflexes      throughout    Musculoskeletal:  ROM 0-120, Ligaments intact,  Imaging Review Plain radiographs demonstrate severe degenerative joint disease of the right knee. The overall alignment is neutral. The bone quality appears to be good for age and reported activity level.  Assessment/Plan: Primary osteoarthritis, right knee   The patient history, physical examination and imaging studies are consistent with advanced degenerative joint disease of the right knee. The patient has failed conservative treatment.  The clearance notes were reviewed.  After discussion with the patient it was felt that Total Knee Replacement was indicated. The procedure,  risks, and benefits of total knee arthroplasty were presented and reviewed. The risks including but not limited to aseptic loosening, infection, blood clots, vascular injury, stiffness, patella tracking problems complications among others were discussed. The patient acknowledged the explanation, agreed to proceed with the plan.  Donia Ast 02/13/2017, 6:29 AM

## 2017-02-14 ENCOUNTER — Encounter (HOSPITAL_COMMUNITY): Payer: Self-pay | Admitting: Orthopedic Surgery

## 2017-02-14 DIAGNOSIS — J90 Pleural effusion, not elsewhere classified: Secondary | ICD-10-CM | POA: Diagnosis not present

## 2017-02-14 DIAGNOSIS — Z87442 Personal history of urinary calculi: Secondary | ICD-10-CM | POA: Diagnosis not present

## 2017-02-14 DIAGNOSIS — E039 Hypothyroidism, unspecified: Secondary | ICD-10-CM | POA: Diagnosis not present

## 2017-02-14 DIAGNOSIS — M1711 Unilateral primary osteoarthritis, right knee: Secondary | ICD-10-CM | POA: Diagnosis not present

## 2017-02-14 DIAGNOSIS — M109 Gout, unspecified: Secondary | ICD-10-CM | POA: Diagnosis not present

## 2017-02-14 DIAGNOSIS — K219 Gastro-esophageal reflux disease without esophagitis: Secondary | ICD-10-CM | POA: Diagnosis not present

## 2017-02-14 LAB — BASIC METABOLIC PANEL
ANION GAP: 11 (ref 5–15)
BUN: 13 mg/dL (ref 6–20)
CALCIUM: 9.1 mg/dL (ref 8.9–10.3)
CO2: 23 mmol/L (ref 22–32)
Chloride: 104 mmol/L (ref 101–111)
Creatinine, Ser: 0.89 mg/dL (ref 0.61–1.24)
GFR calc Af Amer: >60 mL/min (ref >60)
GFR calc non Af Amer: >60 mL/min (ref >60)
GLUCOSE: 122 mg/dL — AB (ref 65–99)
POTASSIUM: 3.9 mmol/L (ref 3.5–5.1)
Sodium: 138 mmol/L (ref 135–145)

## 2017-02-14 LAB — CBC
HEMATOCRIT: 39.8 % (ref 39.0–52.0)
Hemoglobin: 13.2 g/dL (ref 13.0–17.0)
MCH: 31.6 pg (ref 26.0–34.0)
MCHC: 33.2 g/dL (ref 30.0–36.0)
MCV: 95.2 fL (ref 78.0–100.0)
Platelets: 125 10*3/uL — ABNORMAL LOW (ref 150–400)
RBC: 4.18 MIL/uL — ABNORMAL LOW (ref 4.22–5.81)
RDW: 13.9 % (ref 11.5–15.5)
WBC: 10.4 10*3/uL (ref 4.0–10.5)

## 2017-02-14 MED ORDER — OXYCODONE HCL 10 MG PO TABS: 10.0000 mg | ORAL_TABLET | ORAL | 0 refills | Status: DC | PRN

## 2017-02-14 MED ORDER — ASPIRIN 325 MG PO TBEC: 325.0000 mg | DELAYED_RELEASE_TABLET | Freq: Two times a day (BID) | ORAL | 0 refills | Status: DC

## 2017-02-14 MED ORDER — NALOXEGOL OXALATE 25 MG PO TABS: 25.0000 mg | ORAL_TABLET | Freq: Every day | ORAL | 0 refills | Status: DC

## 2017-02-14 MED ORDER — METHOCARBAMOL 500 MG PO TABS: 500.0000 mg | ORAL_TABLET | Freq: Four times a day (QID) | ORAL | 0 refills | Status: DC | PRN

## 2017-02-14 NOTE — Discharge Summary (Signed)
SPORTS MEDICINE & JOINT REPLACEMENT   Samuel Mulch, MD   Samuel Shadow, PA-C [place], [place], [place]  [address]                             [phone]  PATIENT ID: Samuel Little        MRN:  [id number]          DOB/AGE: [birth year] / 83 y.o.    DISCHARGE SUMMARY  ADMISSION DATE:    02/13/2017 DISCHARGE DATE:   02/14/2017   ADMISSION DIAGNOSIS: primary osteoarthritis right knee    DISCHARGE DIAGNOSIS:  primary osteoarthritis right knee    ADDITIONAL DIAGNOSIS: Active Problems:   S/P total knee replacement  Past Medical History:  Diagnosis Date  . 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 Arthritis   . Diverticulosis   . Effusion, pericardium   . Gastroesophageal reflux disease   . Gout   . Hard of hearing    wears bilateral hearing aids  . History of kidney stones   . Hypertension   . Hypothyroidism   . Macular degeneration   . Pleural effusion, left   . Right bundle branch block   . Thyroid disease    hypothyroidism  . Wears glasses     PROCEDURE: Procedure(s): TOTAL KNEE ARTHROPLASTY on 02/13/2017  CONSULTS:    HISTORY:  See H&P in chart  HOSPITAL COURSE:  Samuel Little is a 83 y.o. admitted on 02/13/2017 and found to have a diagnosis of primary osteoarthritis right knee.  After appropriate laboratory studies were obtained  they were taken to the operating room on 02/13/2017 and underwent Procedure(s): TOTAL KNEE ARTHROPLASTY.   They were given perioperative antibiotics:  Anti-infectives (From admission, onward)   Start     Dose/Rate Route Frequency Ordered Stop   02/13/17 1600  ceFAZolin (ANCEF) IVPB 1 g/50 mL premix     1 g 100 mL/hr over 30 Minutes Intravenous Every 6 hours 02/13/17 1409 02/13/17 2252   02/13/17 0716  ceFAZolin (ANCEF) 2-4 GM/100ML-% IVPB    Comments:  Samuel Little   : cabinet override      02/13/17 0716 02/13/17 1008   02/13/17 0712   ceFAZolin (ANCEF) IVPB 2g/100 mL premix     2 g 200 mL/hr over 30 Minutes Intravenous On call to O.R. 02/13/17 7673 02/13/17 1008    .  Patient given tranexamic acid IV or topical and exparel intra-operatively.  Tolerated the procedure well.    POD# 1: Vital signs were stable.  Patient denied Chest pain, shortness of breath, or calf pain.  Patient was started on Lovenox 30 mg subcutaneously twice daily at 8am.  Consults to PT, OT, and care management were made.  The patient was weight bearing as tolerated.  CPM was placed on the operative leg 0-90 degrees for 6-8 hours a day. When out of the CPM, patient was placed in the foam block to achieve full extension. Incentive spirometry was taught.  Dressing was changed.       POD #2, Continued  PT for ambulation and exercise program.  IV saline locked.  O2 discontinued.    The remainder of the hospital course was dedicated to ambulation and strengthening.   The patient was discharged on 1 Day Post-Op in  Good condition.  Blood products given:none  DIAGNOSTIC STUDIES: Recent vital signs:  Patient Vitals for the past 24 hrs:  BP Temp Temp src Pulse Resp SpO2  02/14/17 0740 (!) 152/63 97.8 F (36.6 C) Oral (!) 57 16 97 %  02/13/17 2038 (!) 143/72 97.8 F (36.6 C) Oral 67 16 96 %  02/13/17 1420 (!) 170/71 97.6 F (36.4 C) Oral 64 18 98 %  02/13/17 1400 (!) 162/78 - - (!) 56 19 99 %  02/13/17 1345 (!) 155/94 - - (!) 57 (!) 22 95 %  02/13/17 1330 (!) 159/77 - - (!) 55 16 96 %  02/13/17 1315 (!) 160/77 - - (!) 54 17 95 %  02/13/17 1300 (!) 143/89 - - (!) 56 18 95 %       Recent laboratory studies: Recent Labs    02/14/17 0531  WBC 10.4  HGB 13.2  HCT 39.8  PLT 125*   Recent Labs    02/14/17 0531  NA 138  K 3.9  CL 104  CO2 23  BUN 13  CREATININE 0.89  GLUCOSE 122*  CALCIUM 9.1   Lab Results  Component Value Date   INR 1.65 06/26/2016   INR 1.57 06/25/2016   INR 1.37 12/18/2013     Recent Radiographic Studies :   Ct Angio Chest Aorta W &/or Wo Contrast  Result Date: 02/07/2017 CLINICAL DATA:  Scheduled for right knee arthroplasty. Evaluation of thoracic aorta and due to prior history of echocardiogram demonstrating a dilated aortic root. EXAM: CT ANGIOGRAPHY CHEST WITH CONTRAST TECHNIQUE: Multidetector CT imaging of the chest was performed using the standard protocol during bolus administration of intravenous contrast. Multiplanar CT image reconstructions and MIPs were obtained to evaluate the vascular anatomy. CONTRAST:  145mL ISOVUE-370 IOPAMIDOL (ISOVUE-370) INJECTION 76% COMPARISON:  CT of the chest with contrast on 10/16/2009 FINDINGS: Cardiovascular: The aorta measures approximately 4.3- 4.5 cm at the level of the sinuses of Valsalva. The ascending thoracic aorta shows mild dilatation and measures 4.3 cm in greatest diameter. The proximal arch measures 3.7 cm. The distal arch measures 3.3 cm. The descending thoracic aorta measures 2.9 cm. No evidence of aortic dissection, intramural hemorrhage or penetrating ulcer disease. Proximal great vessels show normal patency and normal branching anatomy. The heart size is normal. No pericardial fluid identified. No significant calcified coronary artery plaque identified. Central pulmonary arteries are normal in caliber. Mediastinum/Nodes: No enlarged mediastinal, hilar, or axillary lymph nodes. Thyroid gland, trachea, and esophagus demonstrate no significant findings. Lungs/Pleura: There is a stable small subpleural calcified granuloma measuring 4 mm in the posterolateral left upper lobe on image number 30, series 5. There is no evidence of pulmonary edema, consolidation, pneumothorax or pleural fluid. Upper Abdomen: Bilateral renal calculi are visible in the upper poles of both kidneys which are nonobstructive. The largest is a right upper pole calculus measuring up to 13 mm in maximum diameter on the coronal reconstructions. Musculoskeletal: No chest wall abnormality. No  acute or significant osseous findings. Review of the MIP images confirms the above findings. IMPRESSION: 1. 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 The ascending thoracic aorta shows mild dilatation and measures 4.3 cm in greatest diameter. Recommend annual imaging followup by CTA or MRA. This recommendation follows 2010 ACCF/AHA/AATS/ACR/ASA/SCA/SCAI/SIR/STS/SVM Guidelines for the Diagnosis and Management of Patients with Thoracic Aortic Disease. Circulation. 2010; 121: J093-O671 2. Stable 4 mm calcified right upper lobe granuloma. 3. Bilateral nonobstructing upper pole renal calculi visualized including an upper pole calculus on the right measuring up to 13 mm in diameter. Aortic aneurysm NOS (ICD10-I71.9).  Electronically Signed   By: Aletta Edouard M.D.   On: 02/07/2017 16:08    DISCHARGE INSTRUCTIONS: Discharge Instructions    CPM   Complete by:  As directed    Continuous passive motion machine (CPM):      Use the CPM from 0 to 90 for 4-6 hours per day.      You may increase by 10 per day.  You may break it up into 2 or 3 sessions per day.      Use CPM for 2 weeks or until you are told to stop.   Call MD / Call 911   Complete by:  As directed    If you experience chest pain or shortness of breath, CALL 911 and be transported to the hospital emergency room.  If you develope a fever above 101 F, pus (white drainage) or increased drainage or redness at the wound, or calf pain, call your surgeon's office.   Constipation Prevention   Complete by:  As directed    Drink plenty of fluids.  Prune juice may be helpful.  You may use a stool softener, such as Colace (over the counter) 100 mg twice a day.  Use MiraLax (over the counter) for constipation as needed.   Diet - low sodium heart healthy   Complete by:  As directed    Discharge instructions   Complete by:  As directed    INSTRUCTIONS AFTER JOINT REPLACEMENT   Remove items at home which could result in a fall. This includes  throw rugs or furniture in walking pathways ICE to the affected joint every three hours while awake for 30 minutes at a time, for at least the first 3-5 days, and then as needed for pain and swelling.  Continue to use ice for pain and swelling. You may notice swelling that will progress down to the foot and ankle.  This is normal after surgery.  Elevate your leg when you are not up walking on it.   Continue to use the breathing machine you got in the hospital (incentive spirometer) which will help keep your temperature down.  It is common for your temperature to cycle up and down following surgery, especially at night when you are not up moving around and exerting yourself.  The breathing machine keeps your lungs expanded and your temperature down.   DIET:  As you were doing prior to hospitalization, we recommend a well-balanced diet.  DRESSING / WOUND CARE / SHOWERING  Keep the surgical dressing until follow up.  The dressing is water proof, so you can shower without any extra covering.  IF THE DRESSING FALLS OFF or the wound gets wet inside, change the dressing with sterile gauze.  Please use good hand washing techniques before changing the dressing.  Do not use any lotions or creams on the incision until instructed by your surgeon.    ACTIVITY  Increase activity slowly as tolerated, but follow the weight bearing instructions below.   No driving for 6 weeks or until further direction given by your physician.  You cannot drive while taking narcotics.  No lifting or carrying greater than 10 lbs. until further directed by your surgeon. Avoid periods of inactivity such as sitting longer than an hour when not asleep. This helps prevent blood clots.  You may return to work once you are authorized by your doctor.     WEIGHT BEARING   Weight bearing as tolerated with assist device (walker, cane, etc) as directed, use it  as long as suggested by your surgeon or therapist, typically at least 4-6  weeks.   EXERCISES  Results after joint replacement surgery are often greatly improved when you follow the exercise, range of motion and muscle strengthening exercises prescribed by your doctor. Safety measures are also important to protect the joint from further injury. Any time any of these exercises cause you to have increased pain or swelling, decrease what you are doing until you are comfortable again and then slowly increase them. If you have problems or questions, call your caregiver or physical therapist for advice.   Rehabilitation is important following a joint replacement. After just a few days of immobilization, the muscles of the leg can become weakened and shrink (atrophy).  These exercises are designed to build up the tone and strength of the thigh and leg muscles and to improve motion. Often times heat used for twenty to thirty minutes before working out will loosen up your tissues and help with improving the range of motion but do not use heat for the first two weeks following surgery (sometimes heat can increase post-operative swelling).   These exercises can be done on a training (exercise) mat, on the floor, on a table or on a bed. Use whatever works the best and is most comfortable for you.    Use music or television while you are exercising so that the exercises are a pleasant break in your day. This will make your life better with the exercises acting as a break in your routine that you can look forward to.   Perform all exercises about fifteen times, three times per day or as directed.  You should exercise both the operative leg and the other leg as well.   Exercises include:   Quad Sets - Tighten up the muscle on the front of the thigh (Quad) and hold for 5-10 seconds.   Straight Leg Raises - With your knee straight (if you were given a brace, keep it on), lift the leg to 60 degrees, hold for 3 seconds, and slowly lower the leg.  Perform this exercise against resistance later  as your leg gets stronger.  Leg Slides: Lying on your back, slowly slide your foot toward your buttocks, bending your knee up off the floor (only go as far as is comfortable). Then slowly slide your foot back down until your leg is flat on the floor again.  Angel Wings: Lying on your back spread your legs to the side as far apart as you can without causing discomfort.  Hamstring Strength:  Lying on your back, push your heel against the floor with your leg straight by tightening up the muscles of your buttocks.  Repeat, but this time bend your knee to a comfortable angle, and push your heel against the floor.  You may put a pillow under the heel to make it more comfortable if necessary.   A rehabilitation program following joint replacement surgery can speed recovery and prevent re-injury in the future due to weakened muscles. Contact your doctor or a physical therapist for more information on knee rehabilitation.    CONSTIPATION  Constipation is defined medically as fewer than three stools per week and severe constipation as less than one stool per week.  Even if you have a regular bowel pattern at home, your normal regimen is likely to be disrupted due to multiple reasons following surgery.  Combination of anesthesia, postoperative narcotics, change in appetite and fluid intake all can affect your bowels.  YOU MUST use at least one of the following options; they are listed in order of increasing strength to get the job done.  They are all available over the counter, and you may need to use some, POSSIBLY even all of these options:    Drink plenty of fluids (prune juice may be helpful) and high fiber foods Colace 100 mg by mouth twice a day  Senokot for constipation as directed and as needed Dulcolax (bisacodyl), take with full glass of water  Miralax (polyethylene glycol) once or twice a day as needed.  If you have tried all these things and are unable to have a bowel movement in the first 3-4  days after surgery call either your surgeon or your primary doctor.    If you experience loose stools or diarrhea, hold the medications until you stool forms back up.  If your symptoms do not get better within 1 week or if they get worse, check with your doctor.  If you experience "the worst abdominal pain ever" or develop nausea or vomiting, please contact the office immediately for further recommendations for treatment.   ITCHING:  If you experience itching with your medications, try taking only a single pain pill, or even half a pain pill at a time.  You can also use Benadryl over the counter for itching or also to help with sleep.   TED HOSE STOCKINGS:  Use stockings on both legs until for at least 2 weeks or as directed by physician office. They may be removed at night for sleeping.  MEDICATIONS:  See your medication summary on the "After Visit Summary" that nursing will review with you.  You may have some home medications which will be placed on hold until you complete the course of blood thinner medication.  It is important for you to complete the blood thinner medication as prescribed.  PRECAUTIONS:  If you experience chest pain or shortness of breath - call 911 immediately for transfer to the hospital emergency department.   If you develop a fever greater that 101 F, purulent drainage from wound, increased redness or drainage from wound, foul odor from the wound/dressing, or calf pain - CONTACT YOUR SURGEON.                                                   FOLLOW-UP APPOINTMENTS:  If you do not already have a post-op appointment, please call the office for an appointment to be seen by your surgeon.  Guidelines for how soon to be seen are listed in your "After Visit Summary", but are typically between 1-4 weeks after surgery.  OTHER INSTRUCTIONS:   Knee Replacement:  Do not place pillow under knee, focus on keeping the knee straight while resting. CPM instructions: 0-90 degrees, 2 hours  in the morning, 2 hours in the afternoon, and 2 hours in the evening. Place foam block, curve side up under heel at all times except when in CPM or when walking.  DO NOT modify, tear, cut, or change the foam block in any way.  MAKE SURE YOU:  Understand these instructions.  Get help right away if you are not doing well or get worse.    Thank you for letting us be a part of your medical care team.  It is a privilege we respect greatly.  We hope these instructions  will help you stay on track for a fast and full recovery!   Increase activity slowly as tolerated   Complete by:  As directed    Place TED hose   Complete by:  As directed    On surgical leg      DISCHARGE MEDICATIONS:   Allergies as of 02/14/2017      Reactions   Nsaids Other (See Comments)   JAUNDICE   Celecoxib Nausea And Vomiting   Meperidine Hcl Nausea Only      Medication List    TAKE these medications   acetaminophen 650 MG CR tablet Commonly known as:  TYLENOL Take 1,300 mg every 8 (eight) hours as needed by mouth for pain.   ASPERCREME EX Apply 1 application as needed topically (for muscle pain).   aspirin 325 MG EC tablet Take 1 tablet (325 mg total) by mouth 2 (two) times daily. What changed:    medication strength  how much to take  when to take this   Bilberry 100 MG Caps Take 100 mg daily by mouth.   bisacodyl 5 MG EC tablet Commonly known as:  DULCOLAX Take 10 mg daily as needed by mouth for moderate constipation.   BLACK CHERRY CONCENTRATE PO Take 1 tablet by mouth daily.   CALTRATE 600 PLUS-VIT D PO Take 1 tablet by mouth daily.   cholecalciferol 1000 units tablet Commonly known as:  VITAMIN D Take 1,000 Units daily by mouth.   CO Q 10 PO Take 120 mg daily by mouth.   colchicine 0.6 MG tablet Take 1 tablet (0.6 mg total) by mouth daily as needed (gout). Please take daily for next 5 days until gout flare is resolved, then as needed What changed:    reasons to take  this  additional instructions   CORAL CALCIUM PO Take 1,000 mg daily by mouth.   CRANBERRY PO Take 500 mg daily by mouth.   doxazosin 4 MG tablet Commonly known as:  CARDURA Take 4 mg by mouth at bedtime.   Fish Oil 1000 MG Caps Take 1,000 mg daily by mouth.   GINKGO BILOBA PO Take 120 mg daily by mouth.   GLUCOSAMINE CHONDROITIN JOINT PO Take 2 tablets daily by mouth.   levothyroxine 175 MCG tablet Commonly known as:  SYNTHROID, LEVOTHROID Take 175 mcg by mouth daily before breakfast.   lisinopril 20 MG tablet Commonly known as:  PRINIVIL,ZESTRIL Take 1 tablet (20 mg total) by mouth daily.   Lutein 6 MG Caps Take 6 mg daily by mouth.   methocarbamol 500 MG tablet Commonly known as:  ROBAXIN Take 1-2 tablets (500-1,000 mg total) by mouth every 6 (six) hours as needed for muscle spasms.   naloxegol oxalate 25 MG Tabs tablet Commonly known as:  MOVANTIK Take 1 tablet (25 mg total) by mouth daily.   OVER THE COUNTER MEDICATION Take 2 tablets 2 (two) times daily by mouth. Macular Protect Complete Supplement   Oxycodone HCl 10 MG Tabs Take 1 tablet (10 mg total) by mouth every 4 (four) hours as needed for moderate pain ((score 4 to 6)).   polyethylene glycol packet Commonly known as:  MIRALAX / GLYCOLAX Take 17 g daily as needed by mouth for moderate constipation.   RESVERATROL PO Take 1 tablet by mouth daily.   senna 8.6 MG Tabs tablet Commonly known as:  SENOKOT Take 1 tablet (8.6 mg total) by mouth at bedtime as needed for mild constipation.   Turmeric 500 MG Caps Take 500  mg 2 (two) times daily by mouth.   VITAMIN B-12 PO Take 1 tablet daily by mouth.   Vitamin C 500 MG Caps Take 500 mg by mouth daily.            Durable Medical Equipment  (From admission, onward)        Start     Ordered   02/13/17 1410  DME Walker rolling  Once    Question:  Patient needs a walker to treat with the following condition  Answer:  S/P total knee  replacement   02/13/17 1409   02/13/17 1410  DME 3 n 1  Once     02/13/17 1409   02/13/17 1410  DME Bedside commode  Once    Question:  Patient needs a bedside commode to treat with the following condition  Answer:  S/P total knee replacement   02/13/17 1409      FOLLOW UP VISIT:    DISPOSITION: HOME VS. SNF  CONDITION:  Good   Donia Ast 02/14/2017, 12:54 PM

## 2017-02-14 NOTE — Progress Notes (Signed)
Samuel Little Seen to be D/C'd Home per MD order.  Discussed prescriptions and follow up appointments with the patient. Prescriptions given to patient, medication list explained in detail. Pt verbalized understanding.  Allergies as of 02/14/2017      Reactions   Nsaids Other (See Comments)   JAUNDICE   Celecoxib Nausea And Vomiting   Meperidine Hcl Nausea Only      Medication List    TAKE these medications   acetaminophen 650 MG CR tablet Commonly known as:  TYLENOL Take 1,300 mg every 8 (eight) hours as needed by mouth for pain.   ASPERCREME EX Apply 1 application as needed topically (for muscle pain).   aspirin 325 MG EC tablet Take 1 tablet (325 mg total) by mouth 2 (two) times daily. What changed:    medication strength  how much to take  when to take this   Bilberry 100 MG Caps Take 100 mg daily by mouth.   bisacodyl 5 MG EC tablet Commonly known as:  DULCOLAX Take 10 mg daily as needed by mouth for moderate constipation.   BLACK CHERRY CONCENTRATE PO Take 1 tablet by mouth daily.   CALTRATE 600 PLUS-VIT D PO Take 1 tablet by mouth daily.   cholecalciferol 1000 units tablet Commonly known as:  VITAMIN D Take 1,000 Units daily by mouth.   CO Q 10 PO Take 120 mg daily by mouth.   colchicine 0.6 MG tablet Take 1 tablet (0.6 mg total) by mouth daily as needed (gout). Please take daily for next 5 days until gout flare is resolved, then as needed What changed:    reasons to take this  additional instructions   CORAL CALCIUM PO Take 1,000 mg daily by mouth.   CRANBERRY PO Take 500 mg daily by mouth.   doxazosin 4 MG tablet Commonly known as:  CARDURA Take 4 mg by mouth at bedtime.   Fish Oil 1000 MG Caps Take 1,000 mg daily by mouth.   GINKGO BILOBA PO Take 120 mg daily by mouth.   GLUCOSAMINE CHONDROITIN JOINT PO Take 2 tablets daily by mouth.   levothyroxine 175 MCG tablet Commonly known as:  SYNTHROID, LEVOTHROID Take 175 mcg by mouth daily  before breakfast.   lisinopril 20 MG tablet Commonly known as:  PRINIVIL,ZESTRIL Take 1 tablet (20 mg total) by mouth daily.   Lutein 6 MG Caps Take 6 mg daily by mouth.   methocarbamol 500 MG tablet Commonly known as:  ROBAXIN Take 1-2 tablets (500-1,000 mg total) by mouth every 6 (six) hours as needed for muscle spasms.   naloxegol oxalate 25 MG Tabs tablet Commonly known as:  MOVANTIK Take 1 tablet (25 mg total) by mouth daily.   OVER THE COUNTER MEDICATION Take 2 tablets 2 (two) times daily by mouth. Macular Protect Complete Supplement   Oxycodone HCl 10 MG Tabs Take 1 tablet (10 mg total) by mouth every 4 (four) hours as needed for moderate pain ((score 4 to 6)).   polyethylene glycol packet Commonly known as:  MIRALAX / GLYCOLAX Take 17 g daily as needed by mouth for moderate constipation.   RESVERATROL PO Take 1 tablet by mouth daily.   senna 8.6 MG Tabs tablet Commonly known as:  SENOKOT Take 1 tablet (8.6 mg total) by mouth at bedtime as needed for mild constipation.   Turmeric 500 MG Caps Take 500 mg 2 (two) times daily by mouth.   VITAMIN B-12 PO Take 1 tablet daily by mouth.   Vitamin  C 500 MG Caps Take 500 mg by mouth daily.            Durable Medical Equipment  (From admission, onward)        Start     Ordered   02/13/17 1410  DME Walker rolling  Once    Question:  Patient needs a walker to treat with the following condition  Answer:  S/P total knee replacement   02/13/17 1409   02/13/17 1410  DME 3 n 1  Once     02/13/17 1409   02/13/17 1410  DME Bedside commode  Once    Question:  Patient needs a bedside commode to treat with the following condition  Answer:  S/P total knee replacement   02/13/17 1409      Vitals:   02/13/17 2038 02/14/17 0740  BP: (!) 143/72 (!) 152/63  Pulse: 67 (!) 57  Resp: 16 16  Temp: 97.8 F (36.6 C) 97.8 F (36.6 C)  SpO2: 96% 97%    Skin clean, dry and intact without evidence of skin break down, no  evidence of skin tears noted, ACE wrap is applied to right knee area, clean, dry, and intact. IV catheter discontinued intact. Site without signs and symptoms of complications. Dressing and pressure applied. Pt denies pain at this time. No complaints noted.  An After Visit Summary and prescriptions printed and given to the patient in envelope. Patient escorted via [place], and D/C home via private auto.  Wayne RN

## 2017-02-14 NOTE — Evaluation (Signed)
Occupational Therapy Evaluation and Discharge Patient Details Name: Samuel Little MRN: [id number] DOB: [birth year] Today's Date: 02/14/2017    History of Present Illness Patient is an 83 yo male s/p R TKA   Clinical Impression   This 83 yo male admitted and underwent above presents to acute OT with all education completed with pt being at an overall S level. No further OT needs, we will sign off.    Follow Up Recommendations  No OT follow up;Supervision - Intermittent    Equipment Recommendations  None recommended by OT       Precautions / Restrictions Precautions Precautions: Knee Restrictions Weight Bearing Restrictions: No RLE Weight Bearing: Weight bearing as tolerated      Mobility Bed Mobility Overal bed mobility: Modified Independent Bed Mobility: Supine to Sit     Supine to sit: Modified independent (Device/Increase time)    Transfers Overall transfer level: Needs assistance Equipment used: Rolling walker (2 wheeled) Transfers: Sit to/from Stand Sit to Stand: Supervision            Balance Overall balance assessment: Needs assistance Sitting-balance support: No upper extremity supported;Feet supported Sitting balance-Leahy Scale: Good     Standing balance support: No upper extremity supported;During functional activity Standing balance-Leahy Scale: Fair Standing balance comment: stood at sink and washed hands                           ADL either performed or assessed with clinical judgement   ADL                                         General ADL Comments: Pt overall at a S level for basic ADLs from RW level     Vision Patient Visual Report: No change from baseline              Pertinent Vitals/Pain Pain Assessment: 0-10 Pain Score: 3  Faces Pain Scale: Hurts little more Pain Location: anterior right knee Pain Descriptors / Indicators: Sore Pain Intervention(s): Limited activity within patient's  tolerance;Monitored during session;Repositioned     Hand Dominance Right   Extremity/Trunk Assessment Upper Extremity Assessment Upper Extremity Assessment: Overall WFL for tasks assessed           Communication Communication Communication: HOH(has hearing aid)   Cognition Arousal/Alertness: Awake/alert Behavior During Therapy: WFL for tasks assessed/performed Overall Cognitive Status: Within Functional Limits for tasks assessed                                                 Home Living Family/patient expects to be discharged to:: Private residence Living Arrangements: Spouse/significant other Available Help at Discharge: Family;Available 24 hours/day Type of Home: House Home Access: Stairs to enter [employer] of Steps: 1   Home Layout: One level(2 steps to change levels)     Bathroom Shower/Tub: Occupational psychologist: Handicapped height Bathroom Accessibility: Yes   Home Equipment: Environmental consultant - 2 wheels;Bedside commode;Shower seat;Grab bars - tub/shower          Prior Functioning/Environment Level of Independence: Independent        Comments: Pt drives, shops and mows.        OT Problem List: Pain  OT Goals(Current goals can be found in the care plan section) Acute Rehab OT Goals Patient Stated Goal: to go home  OT Frequency:                AM-PAC PT "6 Clicks" Daily Activity     Outcome Measure Help from another person eating meals?: None Help from another person taking care of personal grooming?: A Little Help from another person toileting, which includes using toliet, bedpan, or urinal?: A Little Help from another person bathing (including washing, rinsing, drying)?: A Little Help from another person to put on and taking off regular upper body clothing?: A Little Help from another person to put on and taking off regular lower body clothing?: A Little 6 Click Score: 19   End of Session  Equipment Utilized During Treatment: Gait belt;Rolling walker CPM Right Knee CPM Right Knee: On Right Knee Flexion (Degrees): 90 Right Knee Extension (Degrees): -2  Activity Tolerance: Patient tolerated treatment well Patient left: in chair;with call bell/phone within reach  OT Visit Diagnosis: Unsteadiness on feet (R26.81);Pain Pain - Right/Left: Right Pain - part of body: Knee                Time: 7517-0017 OT Time Calculation (min): 37 min Charges:  OT General Charges $OT Visit: 1 Visit OT Evaluation $OT Eval Moderate Complexity: 1 Mod OT Treatments $Self Care/Home Management : 8-22 mins G-Codes: OT G-codes **NOT FOR INPATIENT CLASS** Functional Assessment Tool Used: Clinical judgement Functional Limitation: Self care Self Care Current Status (C9449): At least 1 percent but less than 20 percent impaired, limited or restricted Self Care Goal Status (Q7591): At least 1 percent but less than 20 percent impaired, limited or restricted Self Care Discharge Status [phone]): At least 1 percent but less than 20 percent impaired, limited or restricted   [place], OTR/L [phone] 02/14/2017

## 2017-02-14 NOTE — Progress Notes (Signed)
SPORTS MEDICINE AND JOINT REPLACEMENT  Lara Mulch, MD    Carlyon Shadow, PA-C [address] [place], [place], [place]  [address]                             [phone]   PROGRESS NOTE  Subjective:  negative for Chest Pain  negative for Shortness of Breath  negative for Nausea/Vomiting   negative for Calf Pain  negative for Bowel Movement   Tolerating Diet: yes         Patient reports pain as 3 on 0-10 scale.    Objective: Vital signs in last 24 hours:    Patient Vitals for the past 24 hrs:  BP Temp Temp src Pulse Resp SpO2 Height Weight  02/13/17 2038 (!) 143/72 97.8 F (36.6 C) Oral 67 16 96 % - -  02/13/17 1420 (!) 170/71 97.6 F (36.4 C) Oral 64 18 98 % - -  02/13/17 1400 (!) 162/78 - - (!) 56 19 99 % - -  02/13/17 1345 (!) 155/94 - - (!) 57 (!) 22 95 % - -  02/13/17 1330 (!) 159/77 - - (!) 55 16 96 % - -  02/13/17 1315 (!) 160/77 - - (!) 54 17 95 % - -  02/13/17 1300 (!) 143/89 - - (!) 56 18 95 % - -  02/13/17 1245 (!) 149/70 - - (!) 52 16 93 % - -  02/13/17 1230 (!) 160/68 - - (!) 54 15 95 % - -  02/13/17 1215 139/69 - - (!) 57 16 95 % - -  02/13/17 1203 136/64 (!) 97 F (36.1 C) - (!) 56 16 95 % - -  02/13/17 0843 (!) 162/64 - - (!) 53 19 94 % - -  02/13/17 0840 (!) 162/64 - - (!) 52 19 94 % - -  02/13/17 0835 (!) 155/72 - - 70 18 93 % - -  02/13/17 0830 - - - (!) 59 15 95 % - -  02/13/17 0825 - - - 63 (!) 44 97 % - -  02/13/17 0724 - - - - - - 6\' 1"  (1.854 m) 93.4 kg (206 lb)  02/13/17 0719 (!) 173/82 (!) 97.4 F (36.3 C) Oral 62 20 96 % - -    @flow {1959:LAST@   Intake/Output from previous day:   12/03 0701 - 12/04 0700 In: 1090 [P.O.:240; I.V.:800] Out: 1200 [Urine:1175]   Intake/Output this shift:   No intake/output data recorded.   Intake/Output      12/03 0701 - 12/04 0700 12/04 0701 - 12/05 0700   P.O. 240    I.V. (mL/kg) 800 (8.6)    IV Piggyback 50    Total Intake(mL/kg) 1090 (11.7)    Urine (mL/kg/hr) 1175 (0.5)    Blood 25    Total  Output 1200    Net -110         Urine Occurrence 1 x       LABORATORY DATA: Recent Labs    02/14/17 0531  WBC 10.4  HGB 13.2  HCT 39.8  PLT 125*   Recent Labs    02/14/17 0531  NA 138  K 3.9  CL 104  CO2 23  BUN 13  CREATININE 0.89  GLUCOSE 122*  CALCIUM 9.1   Lab Results  Component Value Date   INR 1.65 06/26/2016   INR 1.57 06/25/2016   INR 1.37 12/18/2013    Examination:  General  appearance: alert, cooperative and no distress Extremities: extremities normal, atraumatic, no cyanosis or edema  Wound Exam: clean, dry, intact   Drainage:  None: wound tissue dry  Motor Exam: Quadriceps and Hamstrings Intact  Sensory Exam: Superficial Peroneal, Deep Peroneal and Tibial normal   Assessment:    1 Day Post-Op  Procedure(s) (LRB): TOTAL KNEE ARTHROPLASTY (Right)  ADDITIONAL DIAGNOSIS:  Active Problems:   S/P total knee replacement     Plan: Physical Therapy as ordered Weight Bearing as Tolerated (WBAT)  DVT Prophylaxis:  Aspirin  DISCHARGE PLAN: Home  DISCHARGE NEEDS: HHPT   Patient doing well, expected D/C home today         Donia Ast 02/14/2017, 7:07 AM

## 2017-02-14 NOTE — Anesthesia Postprocedure Evaluation (Signed)
Anesthesia Post Note  Patient: Samuel Little Seen  Procedure(s) Performed: TOTAL KNEE ARTHROPLASTY (Right )     Patient location during evaluation: PACU Anesthesia Type: Spinal Level of consciousness: awake Pain management: satisfactory to patient Vital Signs Assessment: post-procedure vital signs reviewed and stable Respiratory status: spontaneous breathing Cardiovascular status: blood pressure returned to baseline Postop Assessment: no headache and spinal receding Anesthetic complications: no    Last Vitals:  Vitals:   02/13/17 2038 02/14/17 0740  BP: (!) 143/72 (!) 152/63  Pulse: 67 (!) 57  Resp: 16 16  Temp: 36.6 C 36.6 C  SpO2: 96% 97%    Last Pain:  Vitals:   02/14/17 1228  TempSrc:   PainSc: [place]

## 2017-02-14 NOTE — Progress Notes (Signed)
Physical Therapy Treatment Patient Details Name: Samuel Little MRN: [id number] DOB: [birth year] Today's Date: 02/14/2017    History of Present Illness Patient is an 83 yo male s/p R TKA    PT Comments    Pt mobilizing well today, ambulated 120' with RW and supervision working on reciprocal gait pattern an posture. Pt practiced steps and did well with this. Plan to see one more time this afternoon prior to d/c home. PT will continue to follow.    Follow Up Recommendations  DC plan and follow up therapy as arranged by surgeon     Equipment Recommendations  None recommended by PT    Recommendations for Other Services       Precautions / Restrictions Precautions Precautions: Knee Restrictions Weight Bearing Restrictions: No RLE Weight Bearing: Weight bearing as tolerated    Mobility  Bed Mobility Overal bed mobility: Modified Independent Bed Mobility: Sit to Supine       Sit to supine: Modified independent (Device/Increase time)   General bed mobility comments: pt able to get into bed and position himself independently  Transfers Overall transfer level: Needs assistance Equipment used: Rolling walker (2 wheeled) Transfers: Sit to/from Stand Sit to Stand: Supervision         General transfer comment: no physical assist required, supervision for safety RW for UE support.  Ambulation/Gait Ambulation/Gait assistance: Supervision Ambulation Distance (Feet): 120 Feet Assistive device: Rolling walker (2 wheeled) Gait Pattern/deviations: Antalgic;Step-through pattern Gait velocity: decreased Gait velocity interpretation: Below normal speed for age/gender General Gait Details: worked on taking slightly smaller R step so he could begin to have reciprocal gait pattern. Pt did well with this   Stairs Stairs: Yes   Stair Management: Two rails;Step to pattern;Forwards Number of Stairs: 5 General stair comments: vc's for sequencing  Wheelchair Mobility    Modified  [name] (Stroke Patients Only)       Balance Overall balance assessment: Needs assistance Sitting-balance support: Feet supported Sitting balance-Leahy Scale: Good     Standing balance support: No upper extremity supported Standing balance-Leahy Scale: Fair Standing balance comment: able to maintain static standing without support                            Cognition Arousal/Alertness: Awake/alert Behavior During Therapy: WFL for tasks assessed/performed Overall Cognitive Status: Within Functional Limits for tasks assessed                                        Exercises Total Joint Exercises Ankle Circles/Pumps: AROM;Both;10 reps;Seated Quad Sets: AROM;Both;10 reps;Seated Heel Slides: AROM;Right;10 reps;Seated Hip ABduction/ADduction: AROM;Right;10 reps;Seated Straight Leg Raises: AROM;Right;10 reps;Seated Goniometric ROM: 0-90 Other Exercises Other Exercises: chin tucks 10x Other Exercises: static chest stretch    General Comments General comments (skin integrity, edema, etc.): pt with spinal OA and kyphosis. Discussed posture with ambulation as well as exercises for posture      Pertinent Vitals/Pain Pain Assessment: Faces Faces Pain Scale: Hurts little more Pain Location: anterior right knee Pain Descriptors / Indicators: Sore Pain Intervention(s): Limited activity within patient's tolerance;Monitored during session    Home Living Family/patient expects to be discharged to:: Private residence Living Arrangements: Spouse/significant other Available Help at Discharge: Family;Available 24 hours/day Type of Home: House Home Access: Stairs to enter   Home Layout: One level(2 steps to change levele) Home Equipment: Gilford Rile - 2  wheels;Bedside commode;Shower seat;Grab bars - tub/shower      Prior Function            PT Goals (current goals can now be found in the care plan section) Acute Rehab PT Goals Patient Stated Goal: to go  home PT Goal Formulation: With patient Time For Goal Achievement: 02/27/17 Potential to Achieve Goals: Good Progress towards PT goals: Progressing toward goals    Frequency    7X/week      PT Plan Current plan remains appropriate    Co-evaluation              AM-PAC PT "6 Clicks" Daily Activity  Outcome Measure  Difficulty turning over in bed (including adjusting bedclothes, sheets and blankets)?: A Little Difficulty moving from lying on back to sitting on the side of the bed? : A Little Difficulty sitting down on and standing up from a chair with arms (e.g., wheelchair, bedside commode, etc,.)?: A Little Help needed moving to and from a bed to chair (including a wheelchair)?: A Little Help needed walking in hospital room?: A Little Help needed climbing 3-5 steps with a railing? : A Little 6 Click Score: 18    End of Session Equipment Utilized During Treatment: Gait belt Activity Tolerance: Patient tolerated treatment well Patient left: with call bell/phone within reach;in bed;in CPM Nurse Communication: Mobility status PT Visit Diagnosis: Difficulty in walking, not elsewhere classified (R26.2)     Time: 1423-9532 PT Time Calculation (min) (ACUTE ONLY): 35 min  Charges:  $Gait Training: 8-22 mins $Therapeutic Exercise: 8-22 mins                    G Codes:       Leighton Roach, PT  Acute Rehab Services  [place] 02/14/2017, 10:33 AM

## 2017-02-14 NOTE — Care Management Obs Status (Signed)
[place] NOTIFICATION   Patient Details  Name: Samuel Little MRN: [id number] Date of Birth: 06/02/1932   Medicare Observation Status Notification Given:  Yes    Carles Collet, RN 02/14/2017, 10:31 AM

## 2017-02-14 NOTE — Progress Notes (Signed)
Physical Therapy Treatment Patient Details Name: Samuel Little MRN: [id number] DOB: 04/16/1932 Today's Date: 02/14/2017    History of Present Illness Patient is an 83 yo male s/p R TKA    PT Comments    Pt ambulated 225 with RW and supervision, performing bed mobility and transfers independently. Remained in CPM 2 hrs -2-90 deg without difficulty. Likely to d/c home later today for f/u with HHPT.    Follow Up Recommendations  DC plan and follow up therapy as arranged by surgeon     Equipment Recommendations  None recommended by PT    Recommendations for Other Services       Precautions / Restrictions Precautions Precautions: Knee Restrictions Weight Bearing Restrictions: No RLE Weight Bearing: Weight bearing as tolerated    Mobility  Bed Mobility               General bed mobility comments: pt received in chair  Transfers Overall transfer level: Modified independent Equipment used: Rolling walker (2 wheeled) Transfers: Sit to/from Stand Sit to Stand: Modified independent (Device/Increase time)         General transfer comment: pt standing safely from bed and chair without cues for hand placement  Ambulation/Gait Ambulation/Gait assistance: Supervision Ambulation Distance (Feet): 225 Feet Assistive device: Rolling walker (2 wheeled) Gait Pattern/deviations: Antalgic;Step-through pattern Gait velocity: decreased Gait velocity interpretation: Below normal speed for age/gender General Gait Details: worked on posture in standing, pt self corrected several times   Financial trader Samuel Little (Stroke Patients Only)       Balance Overall balance assessment: Needs assistance Sitting-balance support: No upper extremity supported;Feet supported Sitting balance-Leahy Scale: Good     Standing balance support: No upper extremity supported;During functional activity Standing balance-Leahy Scale: Fair                               Cognition Arousal/Alertness: Awake/alert Behavior During Therapy: WFL for tasks assessed/performed Overall Cognitive Status: Within Functional Limits for tasks assessed                                        Exercises Total Joint Exercises Heel Slides: AROM;Right;10 reps;Seated Long Arc Quad: AROM;Right;20 reps;Seated Knee Flexion: AROM;Right;10 reps;Seated Goniometric ROM: 0-93    General Comments General comments (skin integrity, edema, etc.): education given with pt's wife re: time in CPM, zero knee foam, and overall activity level      Pertinent Vitals/Pain Pain Assessment: 0-10 Faces Pain Scale: Hurts little more Pain Location: anterior right knee Pain Descriptors / Indicators: Sore    Home Living                      Prior Function            PT Goals (current goals can now be found in the care plan section) Acute Rehab PT Goals Patient Stated Goal: to go home PT Goal Formulation: With patient Time For Goal Achievement: 02/27/17 Potential to Achieve Goals: Good Progress towards PT goals: Progressing toward goals    Frequency    7X/week      PT Plan Current plan remains appropriate    Co-evaluation              AM-PAC PT "6 Clicks"  Daily Activity  Outcome Measure  Difficulty turning over in bed (including adjusting bedclothes, sheets and blankets)?: None Difficulty moving from lying on back to sitting on the side of the bed? : None Difficulty sitting down on and standing up from a chair with arms (e.g., wheelchair, bedside commode, etc,.)?: None Help needed moving to and from a bed to chair (including a wheelchair)?: None Help needed walking in hospital room?: A Little Help needed climbing 3-5 steps with a railing? : A Little 6 Click Score: 22    End of Session   Activity Tolerance: Patient tolerated treatment well Patient left: with call bell/phone within reach;in CPM;in chair;with  family/visitor present Nurse Communication: Mobility status PT Visit Diagnosis: Difficulty in walking, not elsewhere classified (R26.2)     Time: 7544-9201 PT Time Calculation (min) (ACUTE ONLY): 21 min  Charges:  $Gait Training: 8-22 mins                    G Codes:       Samuel Little, PT  Acute Rehab Services  [place] 02/14/2017, 3:09 PM

## 2017-02-15 DIAGNOSIS — I4510 Unspecified right bundle-branch block: Secondary | ICD-10-CM | POA: Diagnosis not present

## 2017-02-15 DIAGNOSIS — Z471 Aftercare following joint replacement surgery: Secondary | ICD-10-CM | POA: Diagnosis not present

## 2017-02-15 DIAGNOSIS — M109 Gout, unspecified: Secondary | ICD-10-CM | POA: Diagnosis not present

## 2017-02-15 DIAGNOSIS — I10 Essential (primary) hypertension: Secondary | ICD-10-CM | POA: Diagnosis not present

## 2017-02-15 DIAGNOSIS — Z7982 Long term (current) use of aspirin: Secondary | ICD-10-CM | POA: Diagnosis not present

## 2017-02-15 DIAGNOSIS — Z96651 Presence of right artificial knee joint: Secondary | ICD-10-CM | POA: Diagnosis not present

## 2017-02-16 DIAGNOSIS — Z7982 Long term (current) use of aspirin: Secondary | ICD-10-CM | POA: Diagnosis not present

## 2017-02-16 DIAGNOSIS — I4510 Unspecified right bundle-branch block: Secondary | ICD-10-CM | POA: Diagnosis not present

## 2017-02-16 DIAGNOSIS — M109 Gout, unspecified: Secondary | ICD-10-CM | POA: Diagnosis not present

## 2017-02-16 DIAGNOSIS — Z471 Aftercare following joint replacement surgery: Secondary | ICD-10-CM | POA: Diagnosis not present

## 2017-02-16 DIAGNOSIS — I10 Essential (primary) hypertension: Secondary | ICD-10-CM | POA: Diagnosis not present

## 2017-02-16 DIAGNOSIS — Z96651 Presence of right artificial knee joint: Secondary | ICD-10-CM | POA: Diagnosis not present

## 2017-02-17 DIAGNOSIS — Z471 Aftercare following joint replacement surgery: Secondary | ICD-10-CM | POA: Diagnosis not present

## 2017-02-17 DIAGNOSIS — Z7982 Long term (current) use of aspirin: Secondary | ICD-10-CM | POA: Diagnosis not present

## 2017-02-17 DIAGNOSIS — I10 Essential (primary) hypertension: Secondary | ICD-10-CM | POA: Diagnosis not present

## 2017-02-17 DIAGNOSIS — M109 Gout, unspecified: Secondary | ICD-10-CM | POA: Diagnosis not present

## 2017-02-17 DIAGNOSIS — I4510 Unspecified right bundle-branch block: Secondary | ICD-10-CM | POA: Diagnosis not present

## 2017-02-17 DIAGNOSIS — Z96651 Presence of right artificial knee joint: Secondary | ICD-10-CM | POA: Diagnosis not present

## 2017-02-22 DIAGNOSIS — I10 Essential (primary) hypertension: Secondary | ICD-10-CM | POA: Diagnosis not present

## 2017-02-22 DIAGNOSIS — I4510 Unspecified right bundle-branch block: Secondary | ICD-10-CM | POA: Diagnosis not present

## 2017-02-22 DIAGNOSIS — M109 Gout, unspecified: Secondary | ICD-10-CM | POA: Diagnosis not present

## 2017-02-22 DIAGNOSIS — Z7982 Long term (current) use of aspirin: Secondary | ICD-10-CM | POA: Diagnosis not present

## 2017-02-22 DIAGNOSIS — Z96651 Presence of right artificial knee joint: Secondary | ICD-10-CM | POA: Diagnosis not present

## 2017-02-22 DIAGNOSIS — Z471 Aftercare following joint replacement surgery: Secondary | ICD-10-CM | POA: Diagnosis not present

## 2017-02-23 DIAGNOSIS — M25461 Effusion, right knee: Secondary | ICD-10-CM | POA: Diagnosis not present

## 2017-02-23 DIAGNOSIS — R262 Difficulty in walking, not elsewhere classified: Secondary | ICD-10-CM | POA: Diagnosis not present

## 2017-02-23 DIAGNOSIS — M1711 Unilateral primary osteoarthritis, right knee: Secondary | ICD-10-CM | POA: Diagnosis not present

## 2017-02-23 DIAGNOSIS — R29898 Other symptoms and signs involving the musculoskeletal system: Secondary | ICD-10-CM | POA: Diagnosis not present

## 2017-02-23 DIAGNOSIS — Z96651 Presence of right artificial knee joint: Secondary | ICD-10-CM | POA: Diagnosis not present

## 2017-02-23 DIAGNOSIS — M25661 Stiffness of right knee, not elsewhere classified: Secondary | ICD-10-CM | POA: Diagnosis not present

## 2017-02-28 DIAGNOSIS — M1711 Unilateral primary osteoarthritis, right knee: Secondary | ICD-10-CM | POA: Diagnosis not present

## 2017-02-28 DIAGNOSIS — R29898 Other symptoms and signs involving the musculoskeletal system: Secondary | ICD-10-CM | POA: Diagnosis not present

## 2017-02-28 DIAGNOSIS — M25661 Stiffness of right knee, not elsewhere classified: Secondary | ICD-10-CM | POA: Diagnosis not present

## 2017-02-28 DIAGNOSIS — Z96651 Presence of right artificial knee joint: Secondary | ICD-10-CM | POA: Diagnosis not present

## 2017-02-28 DIAGNOSIS — M25461 Effusion, right knee: Secondary | ICD-10-CM | POA: Diagnosis not present

## 2017-02-28 DIAGNOSIS — R262 Difficulty in walking, not elsewhere classified: Secondary | ICD-10-CM | POA: Diagnosis not present

## 2017-03-03 DIAGNOSIS — R262 Difficulty in walking, not elsewhere classified: Secondary | ICD-10-CM | POA: Diagnosis not present

## 2017-03-03 DIAGNOSIS — M25661 Stiffness of right knee, not elsewhere classified: Secondary | ICD-10-CM | POA: Diagnosis not present

## 2017-03-03 DIAGNOSIS — M25461 Effusion, right knee: Secondary | ICD-10-CM | POA: Diagnosis not present

## 2017-03-03 DIAGNOSIS — M1711 Unilateral primary osteoarthritis, right knee: Secondary | ICD-10-CM | POA: Diagnosis not present

## 2017-03-03 DIAGNOSIS — R29898 Other symptoms and signs involving the musculoskeletal system: Secondary | ICD-10-CM | POA: Diagnosis not present

## 2017-03-03 DIAGNOSIS — Z96651 Presence of right artificial knee joint: Secondary | ICD-10-CM | POA: Diagnosis not present

## 2017-03-06 DIAGNOSIS — M25461 Effusion, right knee: Secondary | ICD-10-CM | POA: Diagnosis not present

## 2017-03-06 DIAGNOSIS — M25661 Stiffness of right knee, not elsewhere classified: Secondary | ICD-10-CM | POA: Diagnosis not present

## 2017-03-06 DIAGNOSIS — R29898 Other symptoms and signs involving the musculoskeletal system: Secondary | ICD-10-CM | POA: Diagnosis not present

## 2017-03-06 DIAGNOSIS — M1711 Unilateral primary osteoarthritis, right knee: Secondary | ICD-10-CM | POA: Diagnosis not present

## 2017-03-06 DIAGNOSIS — R262 Difficulty in walking, not elsewhere classified: Secondary | ICD-10-CM | POA: Diagnosis not present

## 2017-03-06 DIAGNOSIS — Z96651 Presence of right artificial knee joint: Secondary | ICD-10-CM | POA: Diagnosis not present

## 2017-03-09 DIAGNOSIS — R262 Difficulty in walking, not elsewhere classified: Secondary | ICD-10-CM | POA: Diagnosis not present

## 2017-03-09 DIAGNOSIS — M25661 Stiffness of right knee, not elsewhere classified: Secondary | ICD-10-CM | POA: Diagnosis not present

## 2017-03-09 DIAGNOSIS — Z96651 Presence of right artificial knee joint: Secondary | ICD-10-CM | POA: Diagnosis not present

## 2017-03-09 DIAGNOSIS — M1711 Unilateral primary osteoarthritis, right knee: Secondary | ICD-10-CM | POA: Diagnosis not present

## 2017-03-09 DIAGNOSIS — R29898 Other symptoms and signs involving the musculoskeletal system: Secondary | ICD-10-CM | POA: Diagnosis not present

## 2017-03-09 DIAGNOSIS — M25461 Effusion, right knee: Secondary | ICD-10-CM | POA: Diagnosis not present

## 2017-03-15 DIAGNOSIS — Z96651 Presence of right artificial knee joint: Secondary | ICD-10-CM | POA: Diagnosis not present

## 2017-03-15 DIAGNOSIS — R29898 Other symptoms and signs involving the musculoskeletal system: Secondary | ICD-10-CM | POA: Diagnosis not present

## 2017-03-15 DIAGNOSIS — M25661 Stiffness of right knee, not elsewhere classified: Secondary | ICD-10-CM | POA: Diagnosis not present

## 2017-03-15 DIAGNOSIS — M25461 Effusion, right knee: Secondary | ICD-10-CM | POA: Diagnosis not present

## 2017-03-15 DIAGNOSIS — R262 Difficulty in walking, not elsewhere classified: Secondary | ICD-10-CM | POA: Diagnosis not present

## 2017-03-17 DIAGNOSIS — R262 Difficulty in walking, not elsewhere classified: Secondary | ICD-10-CM | POA: Diagnosis not present

## 2017-03-17 DIAGNOSIS — M1711 Unilateral primary osteoarthritis, right knee: Secondary | ICD-10-CM | POA: Diagnosis not present

## 2017-03-17 DIAGNOSIS — M25461 Effusion, right knee: Secondary | ICD-10-CM | POA: Diagnosis not present

## 2017-03-17 DIAGNOSIS — Z96651 Presence of right artificial knee joint: Secondary | ICD-10-CM | POA: Diagnosis not present

## 2017-03-17 DIAGNOSIS — M25661 Stiffness of right knee, not elsewhere classified: Secondary | ICD-10-CM | POA: Diagnosis not present

## 2017-03-17 DIAGNOSIS — R29898 Other symptoms and signs involving the musculoskeletal system: Secondary | ICD-10-CM | POA: Diagnosis not present

## 2017-03-21 DIAGNOSIS — R262 Difficulty in walking, not elsewhere classified: Secondary | ICD-10-CM | POA: Diagnosis not present

## 2017-03-21 DIAGNOSIS — M25661 Stiffness of right knee, not elsewhere classified: Secondary | ICD-10-CM | POA: Diagnosis not present

## 2017-03-21 DIAGNOSIS — M1711 Unilateral primary osteoarthritis, right knee: Secondary | ICD-10-CM | POA: Diagnosis not present

## 2017-03-21 DIAGNOSIS — M25461 Effusion, right knee: Secondary | ICD-10-CM | POA: Diagnosis not present

## 2017-03-21 DIAGNOSIS — Z96651 Presence of right artificial knee joint: Secondary | ICD-10-CM | POA: Diagnosis not present

## 2017-03-21 DIAGNOSIS — R29898 Other symptoms and signs involving the musculoskeletal system: Secondary | ICD-10-CM | POA: Diagnosis not present

## 2017-03-24 DIAGNOSIS — R262 Difficulty in walking, not elsewhere classified: Secondary | ICD-10-CM | POA: Diagnosis not present

## 2017-03-24 DIAGNOSIS — M25461 Effusion, right knee: Secondary | ICD-10-CM | POA: Diagnosis not present

## 2017-03-24 DIAGNOSIS — Z96651 Presence of right artificial knee joint: Secondary | ICD-10-CM | POA: Diagnosis not present

## 2017-03-24 DIAGNOSIS — R29898 Other symptoms and signs involving the musculoskeletal system: Secondary | ICD-10-CM | POA: Diagnosis not present

## 2017-03-24 DIAGNOSIS — M25661 Stiffness of right knee, not elsewhere classified: Secondary | ICD-10-CM | POA: Diagnosis not present

## 2017-03-28 DIAGNOSIS — R262 Difficulty in walking, not elsewhere classified: Secondary | ICD-10-CM | POA: Diagnosis not present

## 2017-03-28 DIAGNOSIS — M25661 Stiffness of right knee, not elsewhere classified: Secondary | ICD-10-CM | POA: Diagnosis not present

## 2017-03-28 DIAGNOSIS — M25461 Effusion, right knee: Secondary | ICD-10-CM | POA: Diagnosis not present

## 2017-03-28 DIAGNOSIS — R29898 Other symptoms and signs involving the musculoskeletal system: Secondary | ICD-10-CM | POA: Diagnosis not present

## 2017-03-28 DIAGNOSIS — Z96651 Presence of right artificial knee joint: Secondary | ICD-10-CM | POA: Diagnosis not present

## 2017-03-31 DIAGNOSIS — M25661 Stiffness of right knee, not elsewhere classified: Secondary | ICD-10-CM | POA: Diagnosis not present

## 2017-03-31 DIAGNOSIS — R29898 Other symptoms and signs involving the musculoskeletal system: Secondary | ICD-10-CM | POA: Diagnosis not present

## 2017-03-31 DIAGNOSIS — Z96651 Presence of right artificial knee joint: Secondary | ICD-10-CM | POA: Diagnosis not present

## 2017-03-31 DIAGNOSIS — M25461 Effusion, right knee: Secondary | ICD-10-CM | POA: Diagnosis not present

## 2017-03-31 DIAGNOSIS — R262 Difficulty in walking, not elsewhere classified: Secondary | ICD-10-CM | POA: Diagnosis not present

## 2017-04-04 DIAGNOSIS — R262 Difficulty in walking, not elsewhere classified: Secondary | ICD-10-CM | POA: Diagnosis not present

## 2017-04-04 DIAGNOSIS — Z96651 Presence of right artificial knee joint: Secondary | ICD-10-CM | POA: Diagnosis not present

## 2017-04-04 DIAGNOSIS — M25661 Stiffness of right knee, not elsewhere classified: Secondary | ICD-10-CM | POA: Diagnosis not present

## 2017-04-04 DIAGNOSIS — M25461 Effusion, right knee: Secondary | ICD-10-CM | POA: Diagnosis not present

## 2017-04-04 DIAGNOSIS — R29898 Other symptoms and signs involving the musculoskeletal system: Secondary | ICD-10-CM | POA: Diagnosis not present

## 2017-04-07 DIAGNOSIS — Z96651 Presence of right artificial knee joint: Secondary | ICD-10-CM | POA: Diagnosis not present

## 2017-04-07 DIAGNOSIS — M25661 Stiffness of right knee, not elsewhere classified: Secondary | ICD-10-CM | POA: Diagnosis not present

## 2017-04-07 DIAGNOSIS — R29898 Other symptoms and signs involving the musculoskeletal system: Secondary | ICD-10-CM | POA: Diagnosis not present

## 2017-04-07 DIAGNOSIS — M25461 Effusion, right knee: Secondary | ICD-10-CM | POA: Diagnosis not present

## 2017-04-07 DIAGNOSIS — R262 Difficulty in walking, not elsewhere classified: Secondary | ICD-10-CM | POA: Diagnosis not present

## 2017-04-11 DIAGNOSIS — Z96651 Presence of right artificial knee joint: Secondary | ICD-10-CM | POA: Diagnosis not present

## 2017-04-11 DIAGNOSIS — R29898 Other symptoms and signs involving the musculoskeletal system: Secondary | ICD-10-CM | POA: Diagnosis not present

## 2017-04-11 DIAGNOSIS — M25461 Effusion, right knee: Secondary | ICD-10-CM | POA: Diagnosis not present

## 2017-04-11 DIAGNOSIS — M25661 Stiffness of right knee, not elsewhere classified: Secondary | ICD-10-CM | POA: Diagnosis not present

## 2017-04-11 DIAGNOSIS — R262 Difficulty in walking, not elsewhere classified: Secondary | ICD-10-CM | POA: Diagnosis not present

## 2017-04-14 DIAGNOSIS — Z96651 Presence of right artificial knee joint: Secondary | ICD-10-CM | POA: Diagnosis not present

## 2017-04-14 DIAGNOSIS — R29898 Other symptoms and signs involving the musculoskeletal system: Secondary | ICD-10-CM | POA: Diagnosis not present

## 2017-04-14 DIAGNOSIS — M25461 Effusion, right knee: Secondary | ICD-10-CM | POA: Diagnosis not present

## 2017-04-14 DIAGNOSIS — R262 Difficulty in walking, not elsewhere classified: Secondary | ICD-10-CM | POA: Diagnosis not present

## 2017-04-14 DIAGNOSIS — M25661 Stiffness of right knee, not elsewhere classified: Secondary | ICD-10-CM | POA: Diagnosis not present

## 2017-04-20 DIAGNOSIS — M25461 Effusion, right knee: Secondary | ICD-10-CM | POA: Diagnosis not present

## 2017-04-20 DIAGNOSIS — Z96651 Presence of right artificial knee joint: Secondary | ICD-10-CM | POA: Diagnosis not present

## 2017-04-20 DIAGNOSIS — R29898 Other symptoms and signs involving the musculoskeletal system: Secondary | ICD-10-CM | POA: Diagnosis not present

## 2017-04-20 DIAGNOSIS — M25661 Stiffness of right knee, not elsewhere classified: Secondary | ICD-10-CM | POA: Diagnosis not present

## 2017-04-20 DIAGNOSIS — R262 Difficulty in walking, not elsewhere classified: Secondary | ICD-10-CM | POA: Diagnosis not present

## 2017-04-24 DIAGNOSIS — H35033 Hypertensive retinopathy, bilateral: Secondary | ICD-10-CM | POA: Diagnosis not present

## 2017-04-24 DIAGNOSIS — Z961 Presence of intraocular lens: Secondary | ICD-10-CM | POA: Diagnosis not present

## 2017-04-24 DIAGNOSIS — H04123 Dry eye syndrome of bilateral lacrimal glands: Secondary | ICD-10-CM | POA: Diagnosis not present

## 2017-04-24 DIAGNOSIS — H353132 Nonexudative age-related macular degeneration, bilateral, intermediate dry stage: Secondary | ICD-10-CM | POA: Diagnosis not present

## 2017-04-27 DIAGNOSIS — M25661 Stiffness of right knee, not elsewhere classified: Secondary | ICD-10-CM | POA: Diagnosis not present

## 2017-04-27 DIAGNOSIS — Z96651 Presence of right artificial knee joint: Secondary | ICD-10-CM | POA: Diagnosis not present

## 2017-04-27 DIAGNOSIS — R262 Difficulty in walking, not elsewhere classified: Secondary | ICD-10-CM | POA: Diagnosis not present

## 2017-04-27 DIAGNOSIS — M25461 Effusion, right knee: Secondary | ICD-10-CM | POA: Diagnosis not present

## 2017-04-27 DIAGNOSIS — R29898 Other symptoms and signs involving the musculoskeletal system: Secondary | ICD-10-CM | POA: Diagnosis not present

## 2017-05-03 DIAGNOSIS — N400 Enlarged prostate without lower urinary tract symptoms: Secondary | ICD-10-CM | POA: Diagnosis not present

## 2017-05-03 DIAGNOSIS — Z0000 Encounter for general adult medical examination without abnormal findings: Secondary | ICD-10-CM | POA: Diagnosis not present

## 2017-05-03 DIAGNOSIS — Z1389 Encounter for screening for other disorder: Secondary | ICD-10-CM | POA: Diagnosis not present

## 2017-05-03 DIAGNOSIS — H8110 Benign paroxysmal vertigo, unspecified ear: Secondary | ICD-10-CM | POA: Diagnosis not present

## 2017-05-03 DIAGNOSIS — N182 Chronic kidney disease, stage 2 (mild): Secondary | ICD-10-CM | POA: Diagnosis not present

## 2017-05-03 DIAGNOSIS — K219 Gastro-esophageal reflux disease without esophagitis: Secondary | ICD-10-CM | POA: Diagnosis not present

## 2017-05-03 DIAGNOSIS — M1990 Unspecified osteoarthritis, unspecified site: Secondary | ICD-10-CM | POA: Diagnosis not present

## 2017-05-03 DIAGNOSIS — R739 Hyperglycemia, unspecified: Secondary | ICD-10-CM | POA: Diagnosis not present

## 2017-05-03 DIAGNOSIS — D696 Thrombocytopenia, unspecified: Secondary | ICD-10-CM | POA: Diagnosis not present

## 2017-05-03 DIAGNOSIS — M109 Gout, unspecified: Secondary | ICD-10-CM | POA: Diagnosis not present

## 2017-05-03 DIAGNOSIS — I10 Essential (primary) hypertension: Secondary | ICD-10-CM | POA: Diagnosis not present

## 2017-05-03 DIAGNOSIS — E039 Hypothyroidism, unspecified: Secondary | ICD-10-CM | POA: Diagnosis not present

## 2017-05-05 DIAGNOSIS — Z85828 Personal history of other malignant neoplasm of skin: Secondary | ICD-10-CM | POA: Diagnosis not present

## 2017-05-05 DIAGNOSIS — L570 Actinic keratosis: Secondary | ICD-10-CM | POA: Diagnosis not present

## 2017-05-05 DIAGNOSIS — L821 Other seborrheic keratosis: Secondary | ICD-10-CM | POA: Diagnosis not present

## 2017-05-05 DIAGNOSIS — Z08 Encounter for follow-up examination after completed treatment for malignant neoplasm: Secondary | ICD-10-CM | POA: Diagnosis not present

## 2017-05-05 DIAGNOSIS — X32XXXD Exposure to sunlight, subsequent encounter: Secondary | ICD-10-CM | POA: Diagnosis not present

## 2017-05-05 DIAGNOSIS — L814 Other melanin hyperpigmentation: Secondary | ICD-10-CM | POA: Diagnosis not present

## 2017-08-01 DIAGNOSIS — Z96651 Presence of right artificial knee joint: Secondary | ICD-10-CM | POA: Diagnosis not present

## 2017-08-29 DIAGNOSIS — L308 Other specified dermatitis: Secondary | ICD-10-CM | POA: Diagnosis not present

## 2017-08-29 DIAGNOSIS — X32XXXD Exposure to sunlight, subsequent encounter: Secondary | ICD-10-CM | POA: Diagnosis not present

## 2017-08-29 DIAGNOSIS — L570 Actinic keratosis: Secondary | ICD-10-CM | POA: Diagnosis not present

## 2017-11-07 DIAGNOSIS — E039 Hypothyroidism, unspecified: Secondary | ICD-10-CM | POA: Diagnosis not present

## 2017-11-07 DIAGNOSIS — I10 Essential (primary) hypertension: Secondary | ICD-10-CM | POA: Diagnosis not present

## 2017-11-07 DIAGNOSIS — D696 Thrombocytopenia, unspecified: Secondary | ICD-10-CM | POA: Diagnosis not present

## 2017-11-07 DIAGNOSIS — N182 Chronic kidney disease, stage 2 (mild): Secondary | ICD-10-CM | POA: Diagnosis not present

## 2017-11-07 DIAGNOSIS — E782 Mixed hyperlipidemia: Secondary | ICD-10-CM | POA: Diagnosis not present

## 2017-11-07 DIAGNOSIS — M109 Gout, unspecified: Secondary | ICD-10-CM | POA: Diagnosis not present

## 2017-11-07 DIAGNOSIS — K219 Gastro-esophageal reflux disease without esophagitis: Secondary | ICD-10-CM | POA: Diagnosis not present

## 2017-11-07 DIAGNOSIS — N400 Enlarged prostate without lower urinary tract symptoms: Secondary | ICD-10-CM | POA: Diagnosis not present

## 2017-11-20 ENCOUNTER — Other Ambulatory Visit: Payer: Self-pay | Admitting: Internal Medicine

## 2017-11-20 ENCOUNTER — Ambulatory Visit
Admission: RE | Admit: 2017-11-20 | Discharge: 2017-11-20 | Disposition: A | Discharge status: Home / Self Care | Payer: Medicare Other | Source: Ambulatory Visit | Attending: Internal Medicine | Admitting: Internal Medicine

## 2017-11-20 DIAGNOSIS — R109 Unspecified abdominal pain: Secondary | ICD-10-CM | POA: Diagnosis not present

## 2017-11-20 DIAGNOSIS — N200 Calculus of kidney: Secondary | ICD-10-CM

## 2017-11-21 DIAGNOSIS — Z974 Presence of external hearing-aid: Secondary | ICD-10-CM | POA: Diagnosis not present

## 2017-11-21 DIAGNOSIS — H60332 Swimmer's ear, left ear: Secondary | ICD-10-CM | POA: Diagnosis not present

## 2017-11-21 DIAGNOSIS — H6122 Impacted cerumen, left ear: Secondary | ICD-10-CM | POA: Diagnosis not present

## 2017-11-21 DIAGNOSIS — H903 Sensorineural hearing loss, bilateral: Secondary | ICD-10-CM | POA: Diagnosis not present

## 2017-11-22 DIAGNOSIS — N200 Calculus of kidney: Secondary | ICD-10-CM | POA: Diagnosis not present

## 2017-12-06 DIAGNOSIS — H906 Mixed conductive and sensorineural hearing loss, bilateral: Secondary | ICD-10-CM | POA: Diagnosis not present

## 2017-12-06 DIAGNOSIS — H903 Sensorineural hearing loss, bilateral: Secondary | ICD-10-CM | POA: Diagnosis not present

## 2017-12-22 DIAGNOSIS — X32XXXD Exposure to sunlight, subsequent encounter: Secondary | ICD-10-CM | POA: Diagnosis not present

## 2017-12-22 DIAGNOSIS — C44311 Basal cell carcinoma of skin of nose: Secondary | ICD-10-CM | POA: Diagnosis not present

## 2017-12-22 DIAGNOSIS — L570 Actinic keratosis: Secondary | ICD-10-CM | POA: Diagnosis not present

## 2017-12-22 DIAGNOSIS — C44629 Squamous cell carcinoma of skin of left upper limb, including shoulder: Secondary | ICD-10-CM | POA: Diagnosis not present

## 2017-12-22 DIAGNOSIS — C44319 Basal cell carcinoma of skin of other parts of face: Secondary | ICD-10-CM | POA: Diagnosis not present

## 2018-01-08 DIAGNOSIS — H35033 Hypertensive retinopathy, bilateral: Secondary | ICD-10-CM | POA: Diagnosis not present

## 2018-01-08 DIAGNOSIS — H04123 Dry eye syndrome of bilateral lacrimal glands: Secondary | ICD-10-CM | POA: Diagnosis not present

## 2018-01-08 DIAGNOSIS — H353132 Nonexudative age-related macular degeneration, bilateral, intermediate dry stage: Secondary | ICD-10-CM | POA: Diagnosis not present

## 2018-01-08 DIAGNOSIS — Z961 Presence of intraocular lens: Secondary | ICD-10-CM | POA: Diagnosis not present

## 2018-01-11 DIAGNOSIS — Z23 Encounter for immunization: Secondary | ICD-10-CM | POA: Diagnosis not present

## 2018-01-23 DIAGNOSIS — C44319 Basal cell carcinoma of skin of other parts of face: Secondary | ICD-10-CM | POA: Diagnosis not present

## 2018-01-23 DIAGNOSIS — X32XXXD Exposure to sunlight, subsequent encounter: Secondary | ICD-10-CM | POA: Diagnosis not present

## 2018-01-23 DIAGNOSIS — L570 Actinic keratosis: Secondary | ICD-10-CM | POA: Diagnosis not present

## 2018-01-23 DIAGNOSIS — Z08 Encounter for follow-up examination after completed treatment for malignant neoplasm: Secondary | ICD-10-CM | POA: Diagnosis not present

## 2018-01-23 DIAGNOSIS — Z85828 Personal history of other malignant neoplasm of skin: Secondary | ICD-10-CM | POA: Diagnosis not present

## 2018-02-27 DIAGNOSIS — Z471 Aftercare following joint replacement surgery: Secondary | ICD-10-CM | POA: Diagnosis not present

## 2018-02-27 DIAGNOSIS — Z96651 Presence of right artificial knee joint: Secondary | ICD-10-CM | POA: Diagnosis not present

## 2018-03-20 DIAGNOSIS — B355 Tinea imbricata: Secondary | ICD-10-CM | POA: Diagnosis not present

## 2018-03-20 DIAGNOSIS — B359 Dermatophytosis, unspecified: Secondary | ICD-10-CM | POA: Diagnosis not present

## 2018-03-20 DIAGNOSIS — Z08 Encounter for follow-up examination after completed treatment for malignant neoplasm: Secondary | ICD-10-CM | POA: Diagnosis not present

## 2018-03-20 DIAGNOSIS — Z85828 Personal history of other malignant neoplasm of skin: Secondary | ICD-10-CM | POA: Diagnosis not present

## 2018-04-03 DIAGNOSIS — B355 Tinea imbricata: Secondary | ICD-10-CM | POA: Diagnosis not present

## 2018-04-08 IMAGING — CR DG CHEST 1V PORT
1 series · 1 of 1 positions shown · non-contrast
Comparison: 06/23/2016 gallbladder ultrasound, CT abdomen from
12/18/2013 and CXR 12/18/2013

CLINICAL DATA: Worsening abdominal pain

EXAM:
PORTABLE CHEST 1 VIEW

[AP]
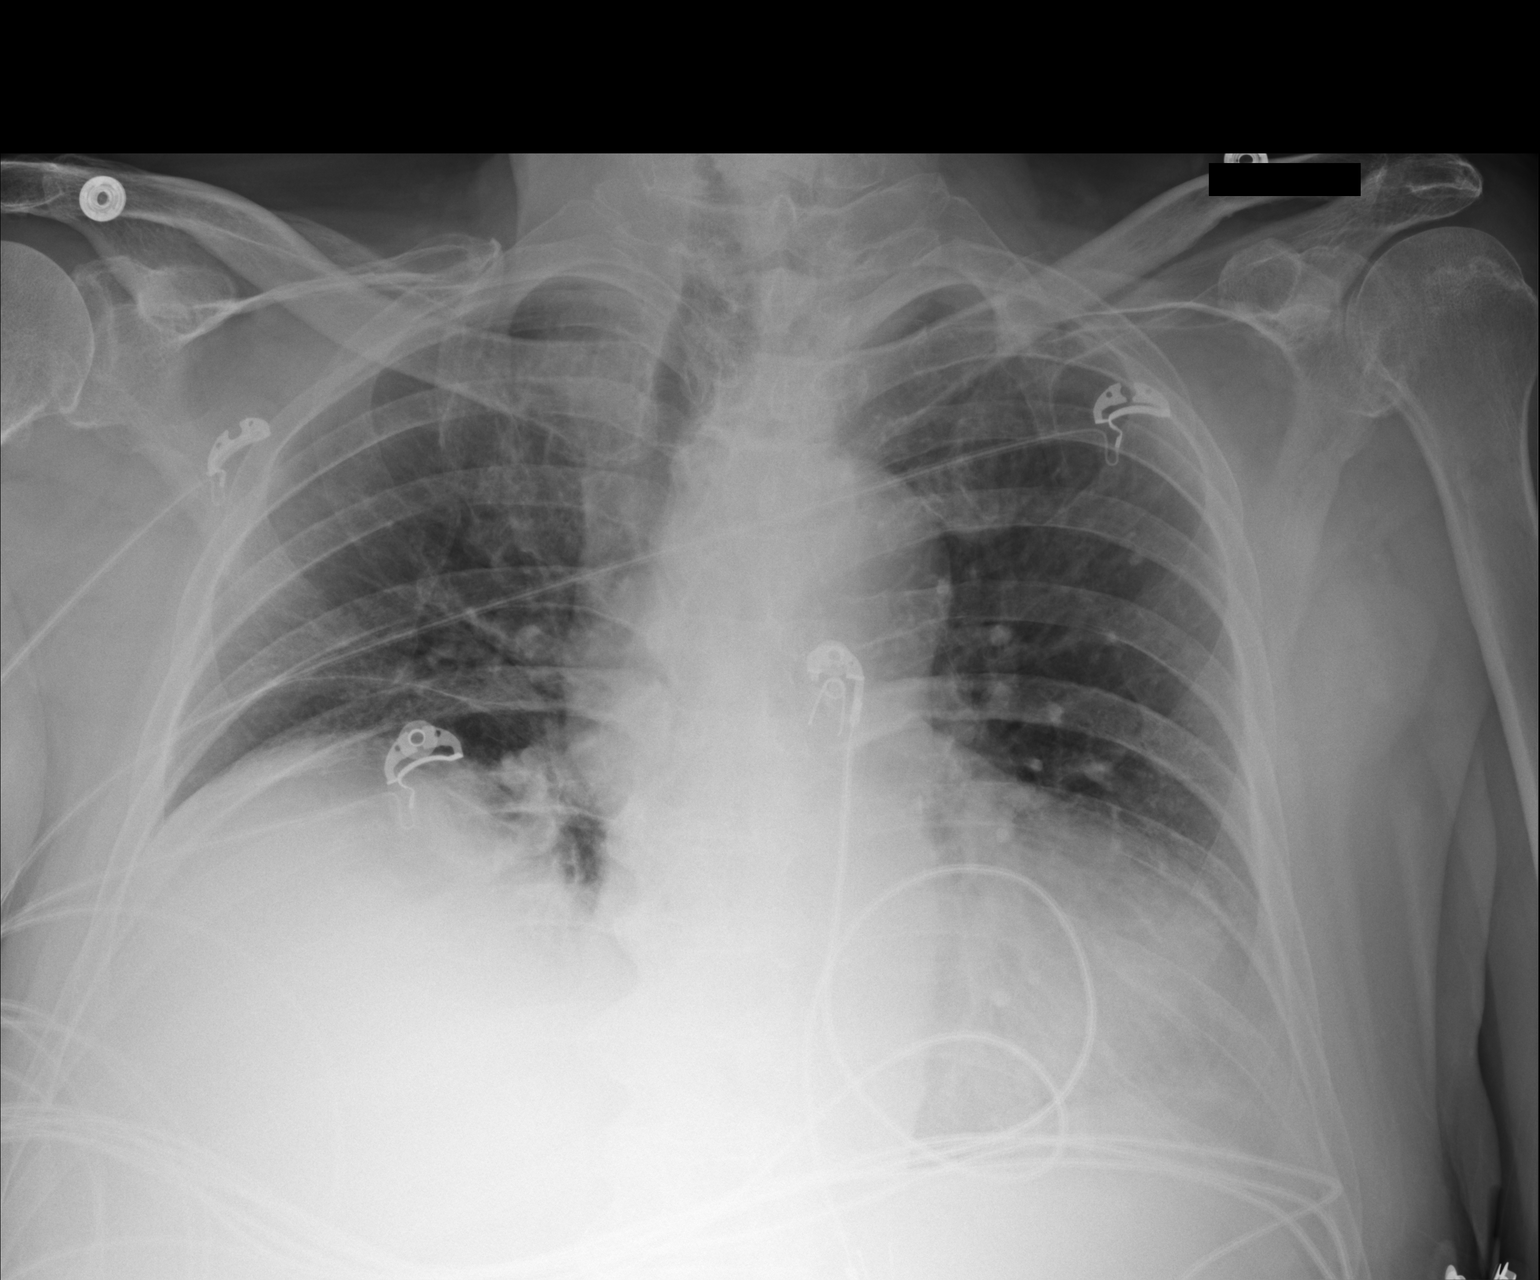

[1 of 1 positions shown; findings below may reference images not displayed]

FINDINGS: Elevated right hemidiaphragm appears to be a chronic finding. There
is atelectasis at the lung bases. No pneumonic consolidation,
effusion or pneumothorax. There is aortic atherosclerosis with
slight uncoiling of the aorta. Borderline cardiomegaly. No
pneumothorax. No acute nor suspicious osseous abnormalities.
IMPRESSION: Chronic elevation of the right hemidiaphragm with right basilar
atelectasis. Aortic atherosclerosis. No acute pneumonic
consolidation, CHF nor effusion.

## 2018-05-10 DIAGNOSIS — Z0000 Encounter for general adult medical examination without abnormal findings: Secondary | ICD-10-CM | POA: Diagnosis not present

## 2018-05-10 DIAGNOSIS — M109 Gout, unspecified: Secondary | ICD-10-CM | POA: Diagnosis not present

## 2018-05-10 DIAGNOSIS — I471 Supraventricular tachycardia: Secondary | ICD-10-CM | POA: Diagnosis not present

## 2018-05-10 DIAGNOSIS — R7303 Prediabetes: Secondary | ICD-10-CM | POA: Diagnosis not present

## 2018-05-10 DIAGNOSIS — Z1211 Encounter for screening for malignant neoplasm of colon: Secondary | ICD-10-CM | POA: Diagnosis not present

## 2018-05-10 DIAGNOSIS — D696 Thrombocytopenia, unspecified: Secondary | ICD-10-CM | POA: Diagnosis not present

## 2018-05-10 DIAGNOSIS — K219 Gastro-esophageal reflux disease without esophagitis: Secondary | ICD-10-CM | POA: Diagnosis not present

## 2018-05-10 DIAGNOSIS — H8110 Benign paroxysmal vertigo, unspecified ear: Secondary | ICD-10-CM | POA: Diagnosis not present

## 2018-05-10 DIAGNOSIS — E039 Hypothyroidism, unspecified: Secondary | ICD-10-CM | POA: Diagnosis not present

## 2018-05-10 DIAGNOSIS — Z1389 Encounter for screening for other disorder: Secondary | ICD-10-CM | POA: Diagnosis not present

## 2018-05-10 DIAGNOSIS — M1990 Unspecified osteoarthritis, unspecified site: Secondary | ICD-10-CM | POA: Diagnosis not present

## 2018-05-10 DIAGNOSIS — E782 Mixed hyperlipidemia: Secondary | ICD-10-CM | POA: Diagnosis not present

## 2018-05-10 DIAGNOSIS — I10 Essential (primary) hypertension: Secondary | ICD-10-CM | POA: Diagnosis not present

## 2018-07-20 DIAGNOSIS — H43813 Vitreous degeneration, bilateral: Secondary | ICD-10-CM | POA: Diagnosis not present

## 2018-07-20 DIAGNOSIS — H43393 Other vitreous opacities, bilateral: Secondary | ICD-10-CM | POA: Diagnosis not present

## 2018-07-20 DIAGNOSIS — H353231 Exudative age-related macular degeneration, bilateral, with active choroidal neovascularization: Secondary | ICD-10-CM | POA: Diagnosis not present

## 2018-07-20 DIAGNOSIS — H35033 Hypertensive retinopathy, bilateral: Secondary | ICD-10-CM | POA: Diagnosis not present

## 2018-08-20 DIAGNOSIS — H353231 Exudative age-related macular degeneration, bilateral, with active choroidal neovascularization: Secondary | ICD-10-CM | POA: Diagnosis not present

## 2018-09-24 DIAGNOSIS — H353231 Exudative age-related macular degeneration, bilateral, with active choroidal neovascularization: Secondary | ICD-10-CM | POA: Diagnosis not present

## 2018-09-24 DIAGNOSIS — H43393 Other vitreous opacities, bilateral: Secondary | ICD-10-CM | POA: Diagnosis not present

## 2018-09-24 DIAGNOSIS — H35033 Hypertensive retinopathy, bilateral: Secondary | ICD-10-CM | POA: Diagnosis not present

## 2018-09-24 DIAGNOSIS — H43813 Vitreous degeneration, bilateral: Secondary | ICD-10-CM | POA: Diagnosis not present

## 2018-10-24 IMAGING — US US ABDOMEN LIMITED
1 series · 14 of 25 positions shown · non-contrast
Comparison: Prior CT from 12/18/2013.

CLINICAL DATA: Initial evaluation for acute right upper quadrant
pain.

EXAM:
US ABDOMEN LIMITED - RIGHT UPPER QUADRANT

[Series 1: us abdomen limited · 0.30mm/px · 14 of 43 slices shown]
[im 1/43]
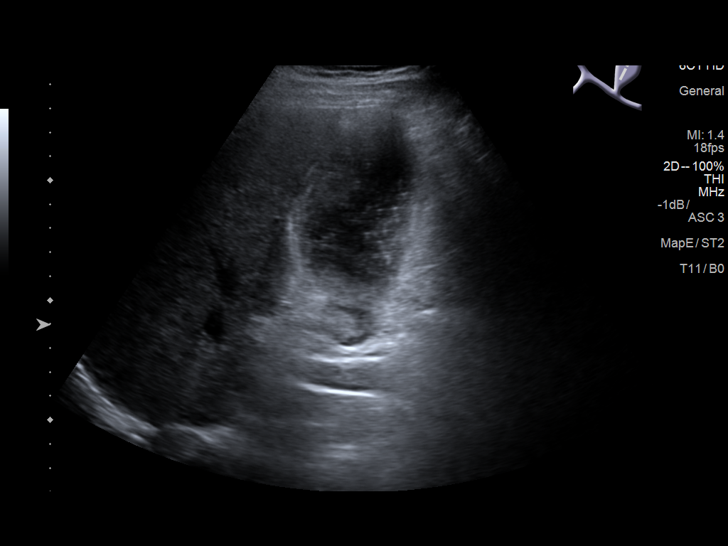
[im 4/43]
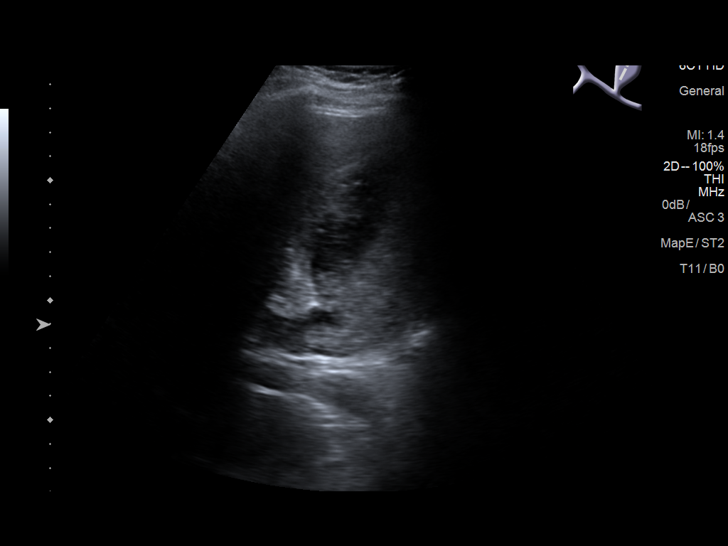
[im 8/43]
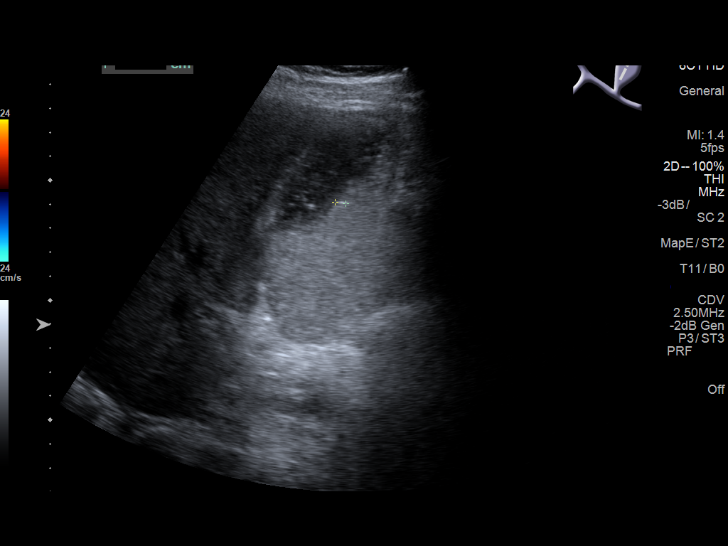
[im 11/43]
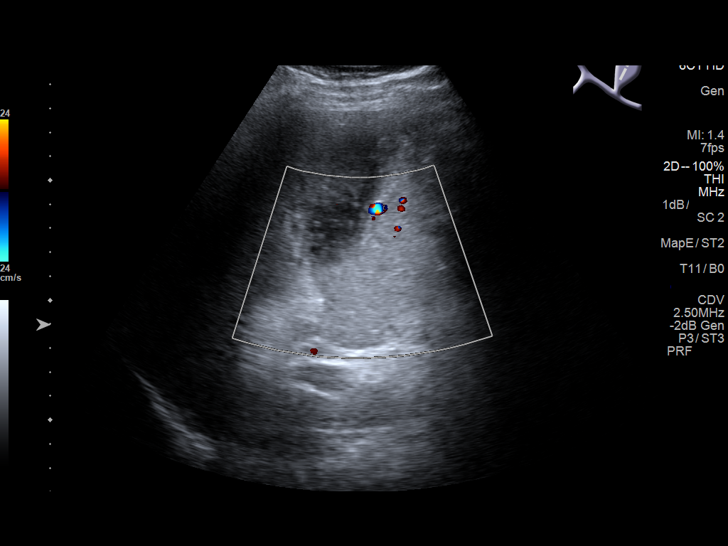
[im 15/43]
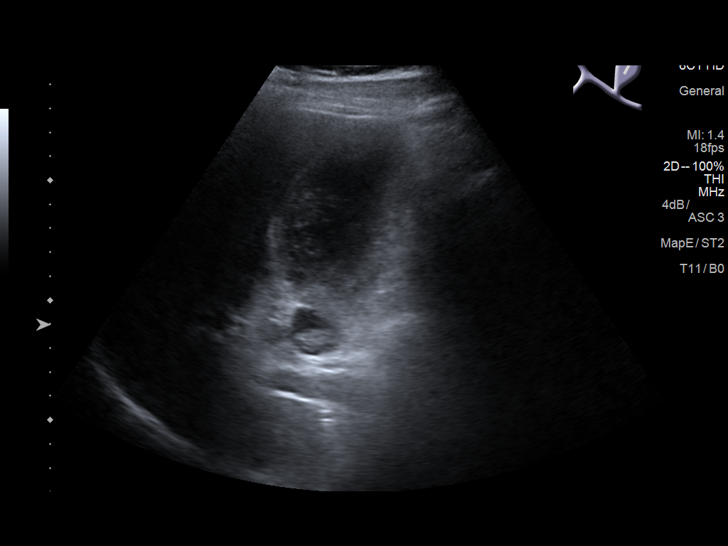
[im 16/43]
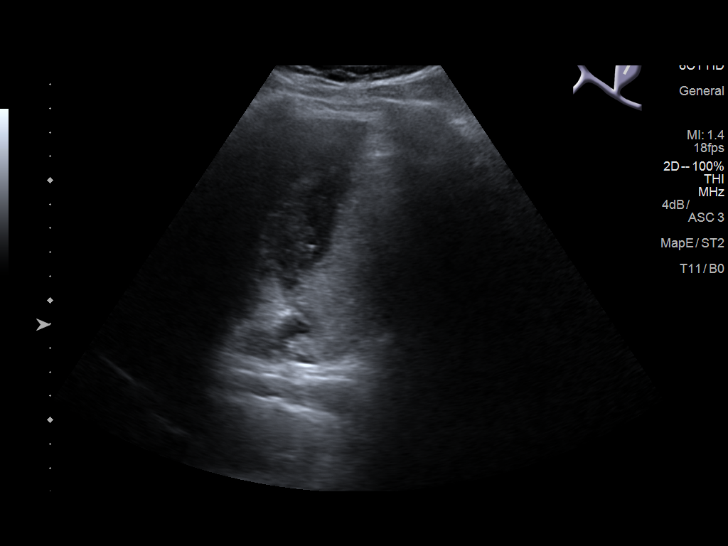
[im 20/43]
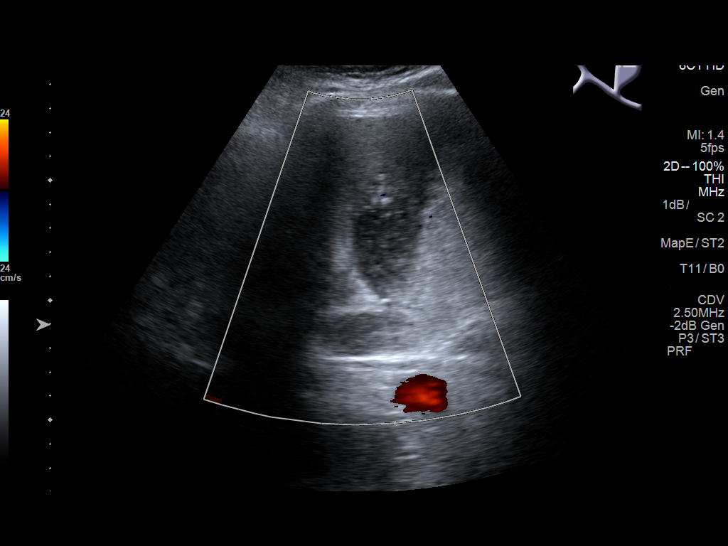
[im 23/43]
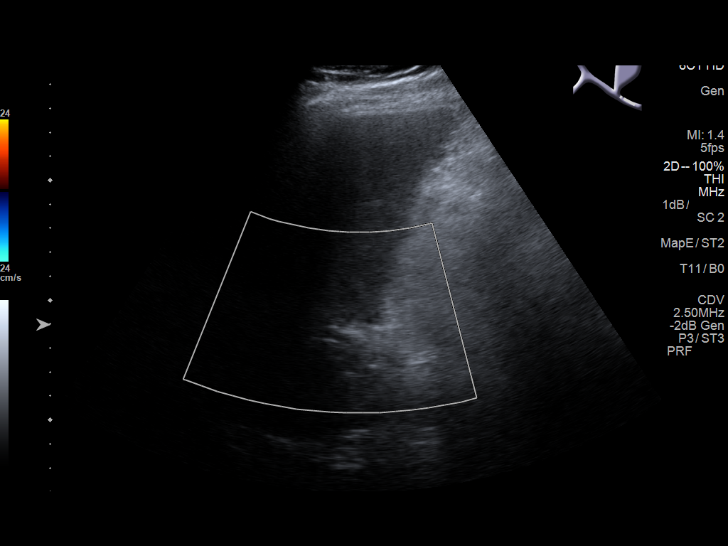
[im 27/43]
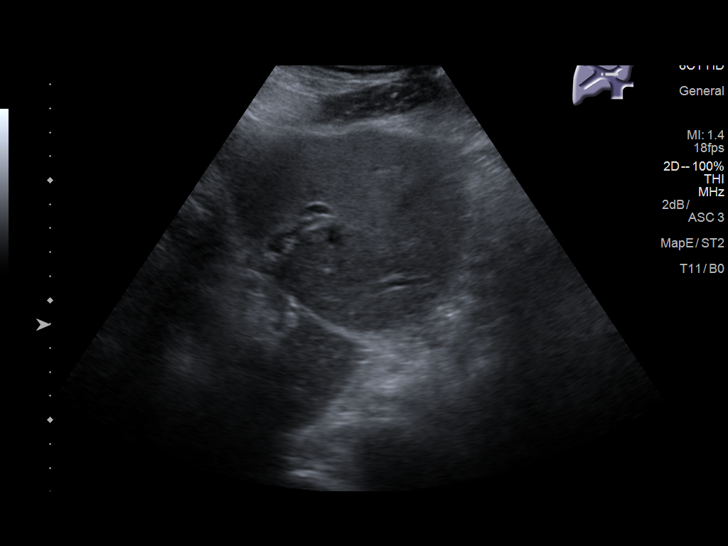
[im 29/43]
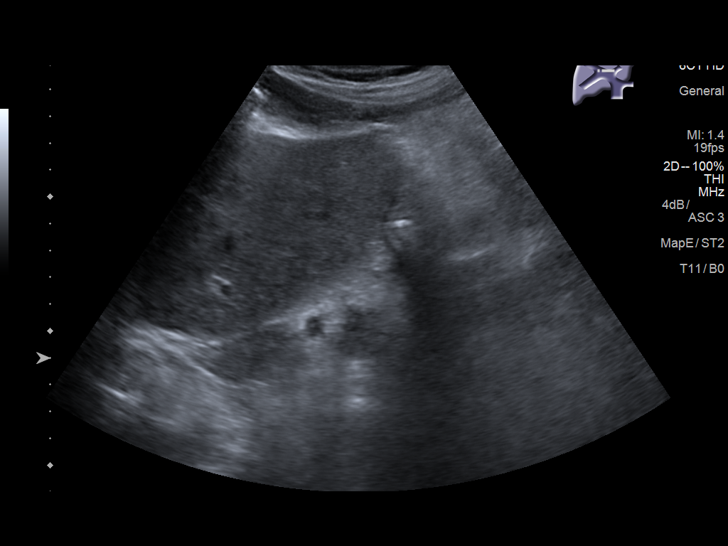
[im 32/43]
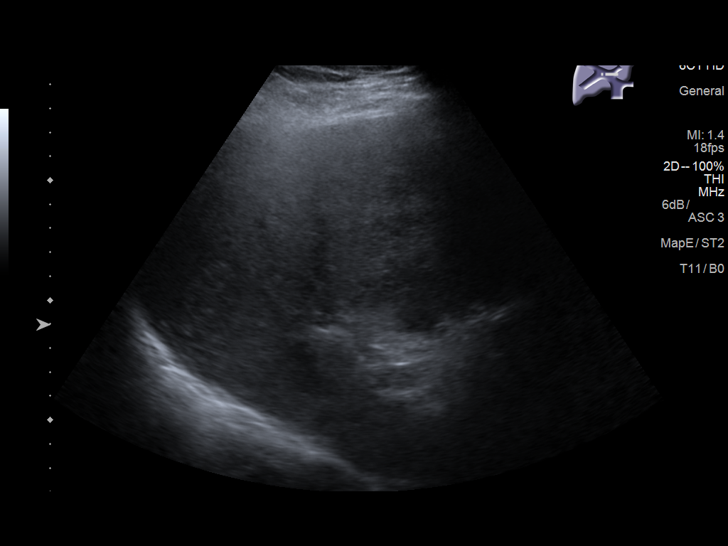
[im 36/43]
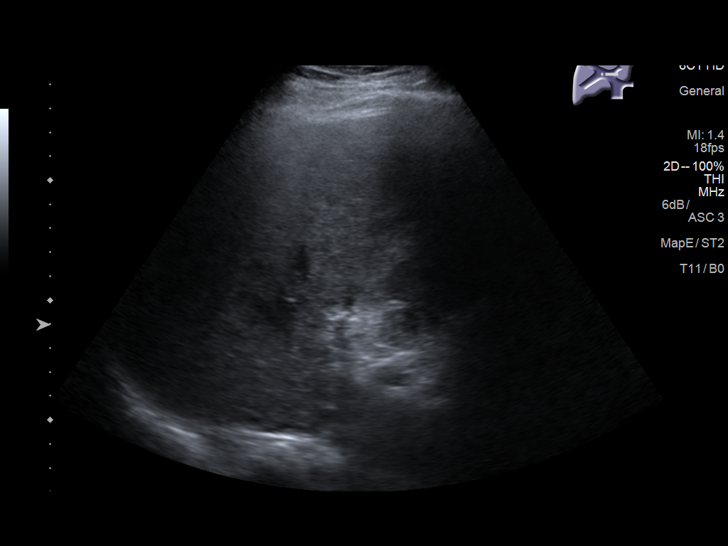
[im 39/43]
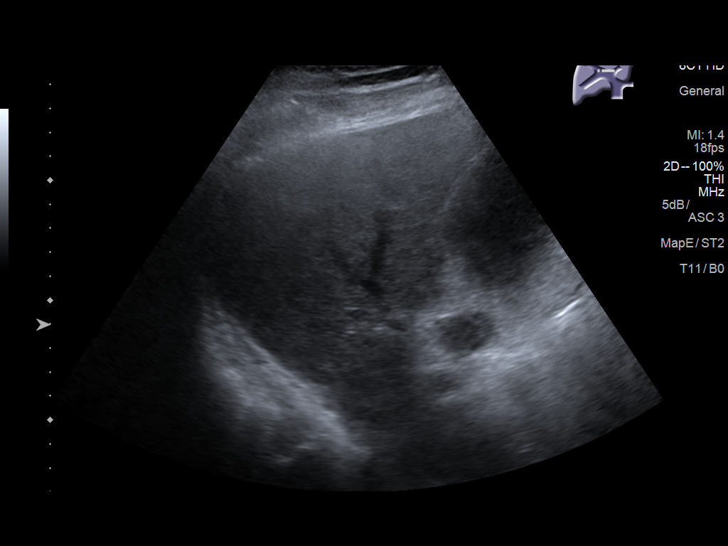
[im 43/43]
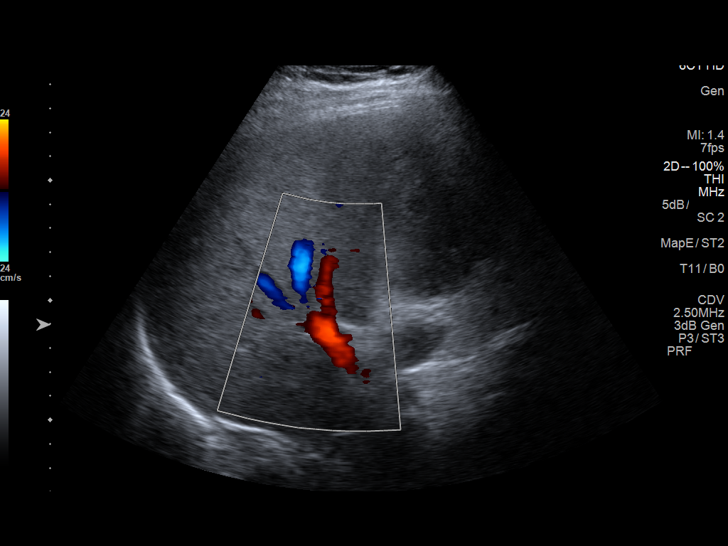

[14 of 25 positions shown; findings below may reference images not displayed]

FINDINGS: Gallbladder:

Stones and sludge present within the gallbladder lumen. Largest
stone measured approximately 5 mm. No free pericholecystic fluid. No
abnormal gallbladder wall thickening. No sonographic Murphy sign
elicited on exam.

Common bile duct:

Diameter: 4.2 mm

Liver:

No focal lesion identified. Within normal limits in parenchymal
echogenicity.
IMPRESSION: 1. Stones and sludge within the gallbladder lumen. No other
sonographic features to suggest acute cholecystitis.
2. No biliary dilatation.

## 2018-10-25 IMAGING — US US ABDOMEN LIMITED
1 series · 14 of 25 positions shown · non-contrast
Comparison: Abdominal ultrasound June 23, 2016.

CLINICAL DATA: Persistent RIGHT upper quadrant pain and
leukocytosis.

EXAM:
US ABDOMEN LIMITED - RIGHT UPPER QUADRANT

[Series 1: us abdomen limited · 0.31mm/px · 14 of 51 slices shown]
[im 1/51]
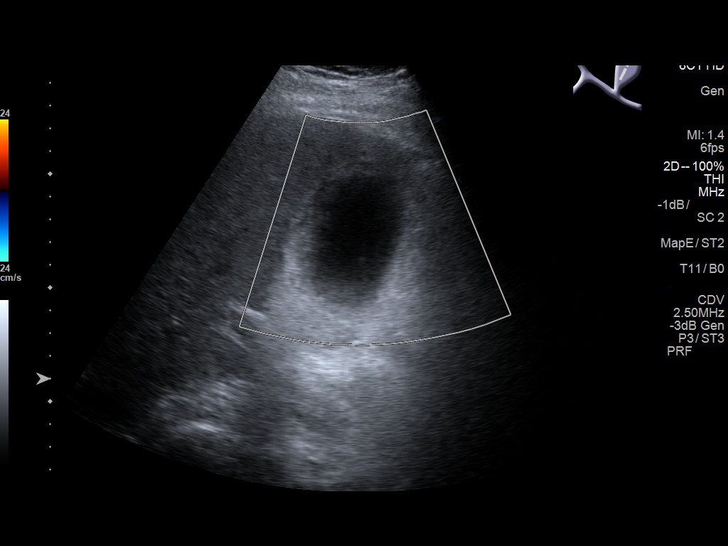
[im 5/51]
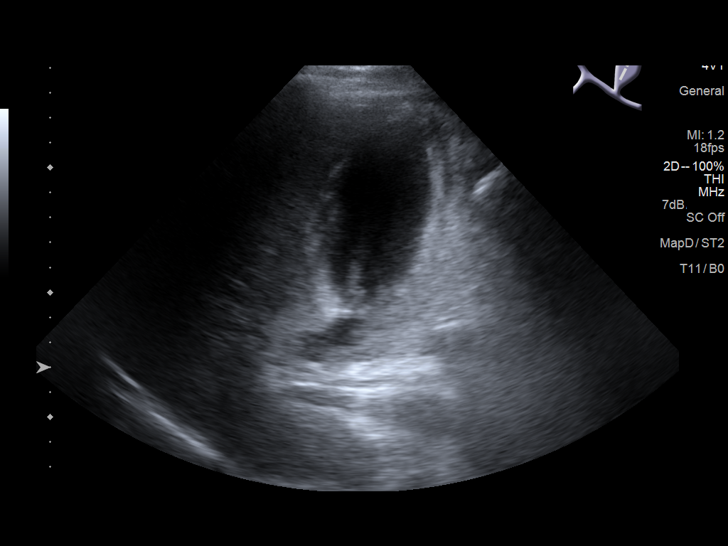
[im 9/51]
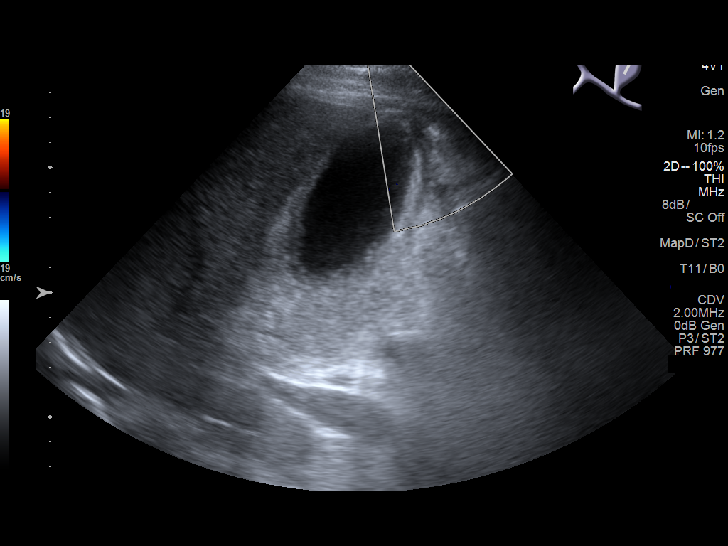
[im 13/51]
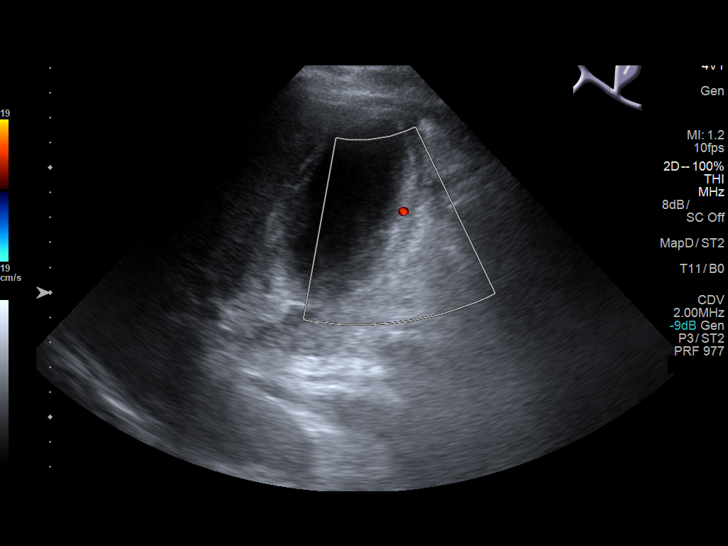
[im 17/51]
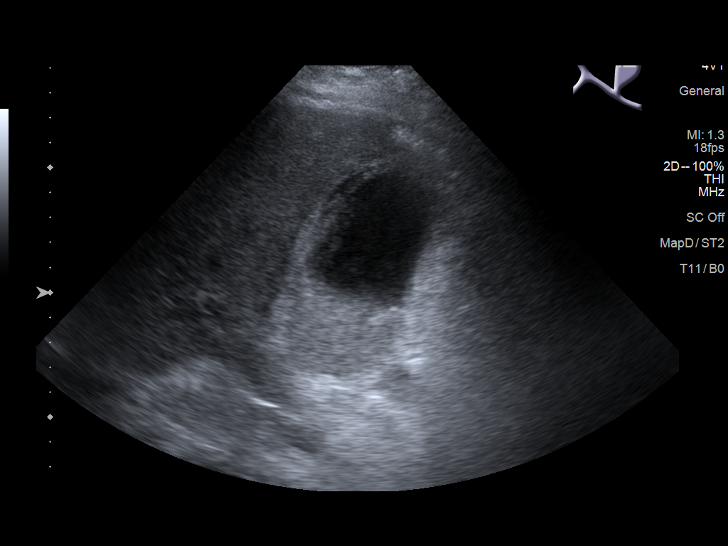
[im 19/51]
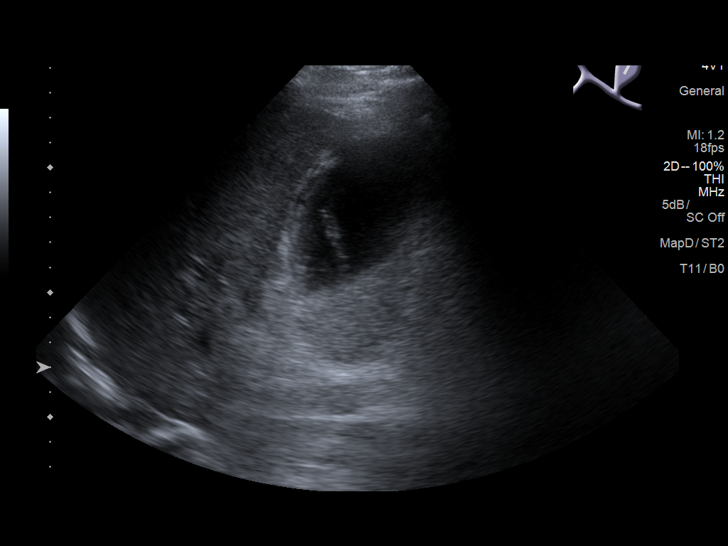
[im 23/51]
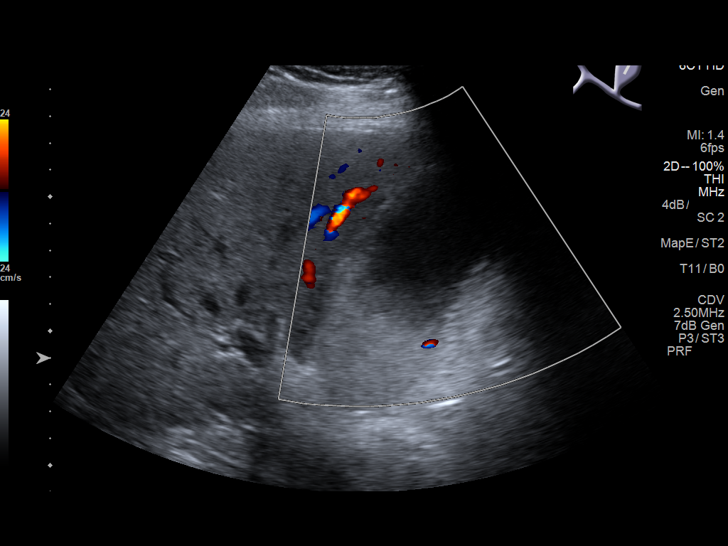
[im 28/51]
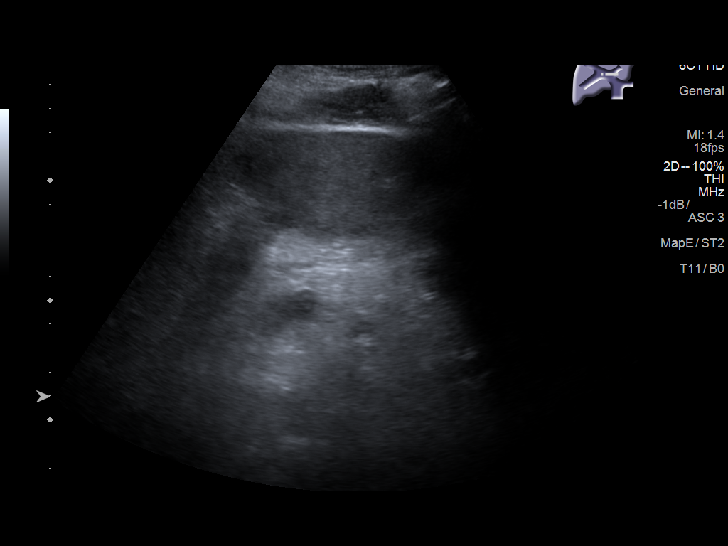
[im 32/51]
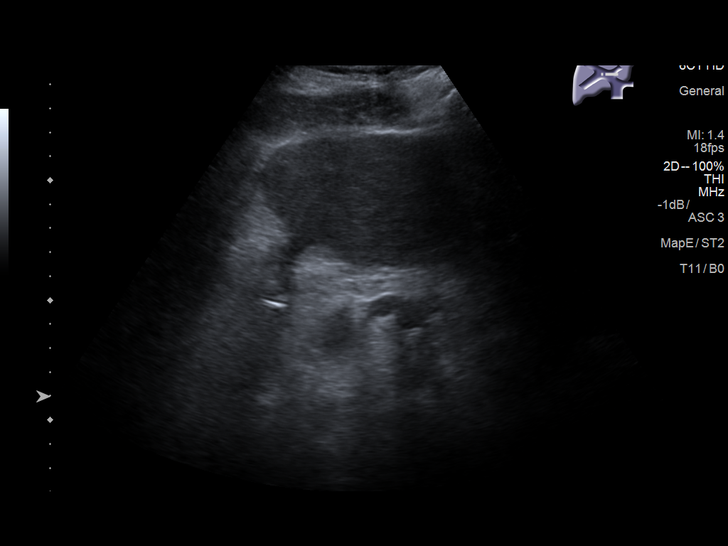
[im 34/51]
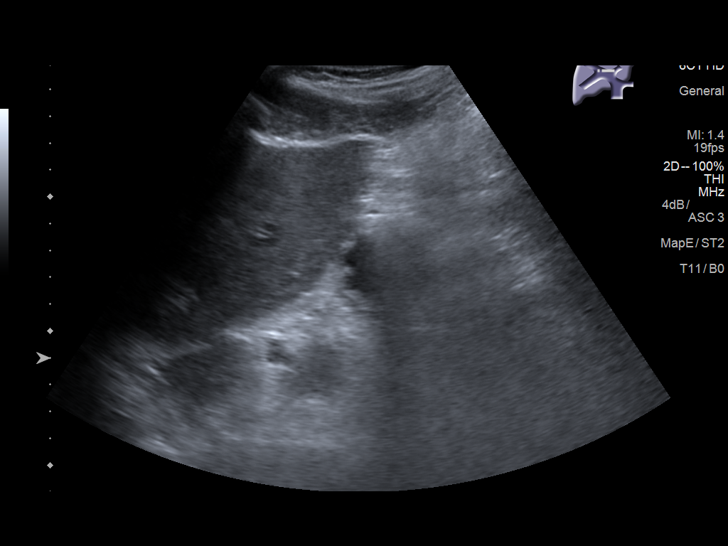
[im 38/51]
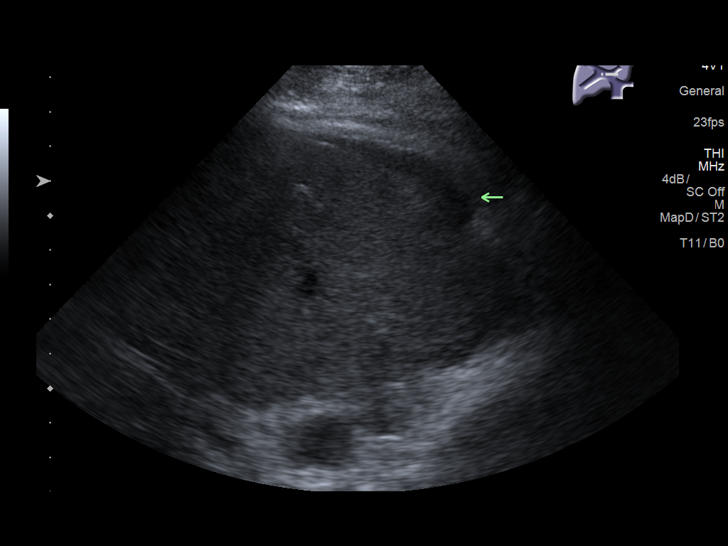
[im 42/51]
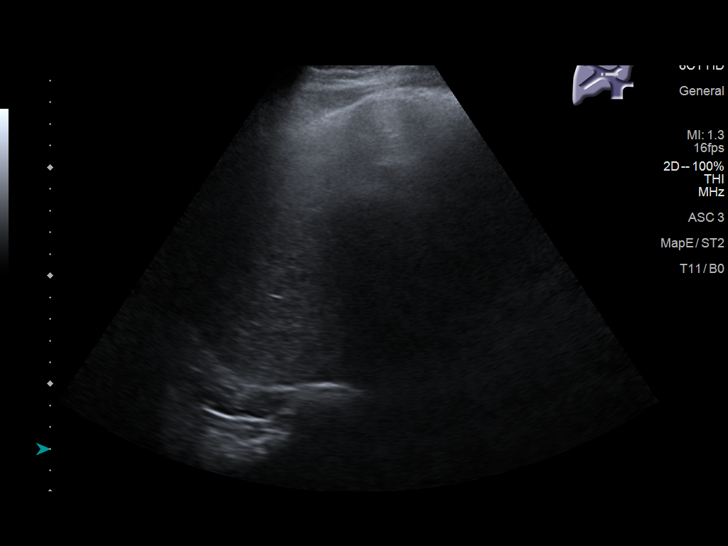
[im 46/51]
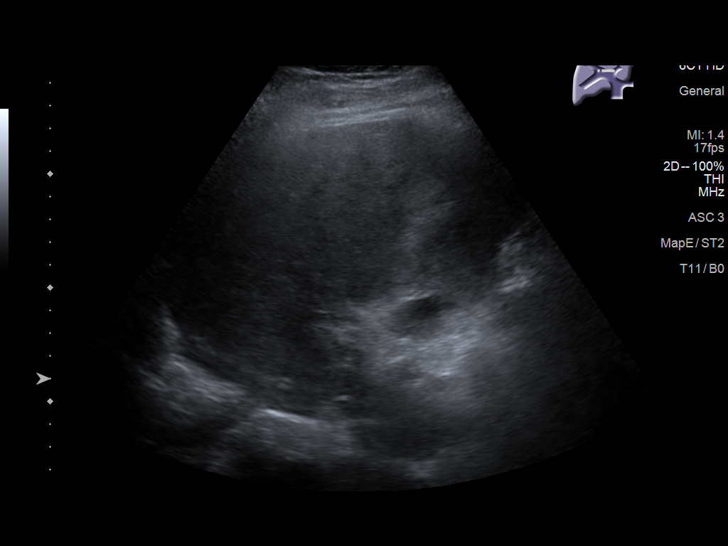
[im 51/51]
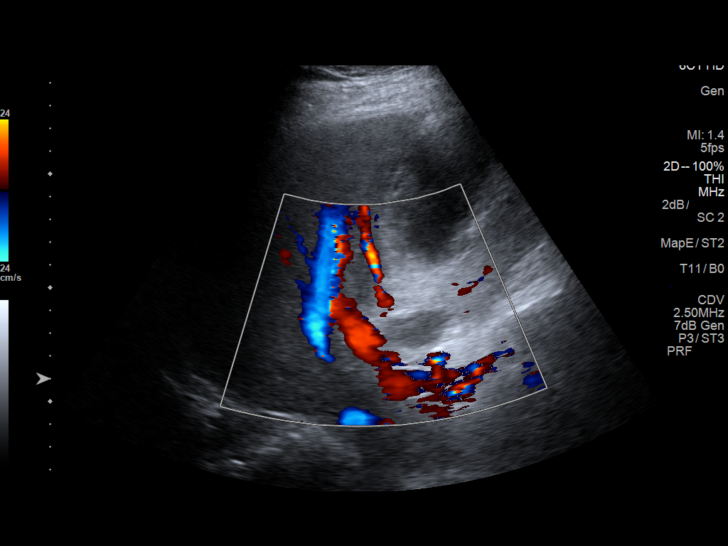

[14 of 25 positions shown; findings below may reference images not displayed]

FINDINGS: Gallbladder:

Gallbladder wall thickening at 5 mm. Pericholecystic fluid.
Echogenic sludge in a few punctate suspected gallstones. No
sonographic Murphy's sign elicited though, there is general RIGHT
upper quadrant pain.

Common bile duct:

Diameter: 5 mm

Liver:

No focal lesion identified. Within normal limits in parenchymal
echogenicity. Small amount of perihepatic ascites.
IMPRESSION: Sonographic findings of acute cholecystitis progressed from 1 day
prior, even in the absence of Murphy's sign.

New small volume perihepatic ascites.

## 2018-11-12 DIAGNOSIS — H353231 Exudative age-related macular degeneration, bilateral, with active choroidal neovascularization: Secondary | ICD-10-CM | POA: Diagnosis not present

## 2018-11-12 DIAGNOSIS — H35033 Hypertensive retinopathy, bilateral: Secondary | ICD-10-CM | POA: Diagnosis not present

## 2018-11-13 DIAGNOSIS — E782 Mixed hyperlipidemia: Secondary | ICD-10-CM | POA: Diagnosis not present

## 2018-11-13 DIAGNOSIS — D696 Thrombocytopenia, unspecified: Secondary | ICD-10-CM | POA: Diagnosis not present

## 2018-11-13 DIAGNOSIS — I10 Essential (primary) hypertension: Secondary | ICD-10-CM | POA: Diagnosis not present

## 2018-11-13 DIAGNOSIS — I700 Atherosclerosis of aorta: Secondary | ICD-10-CM | POA: Diagnosis not present

## 2018-11-13 DIAGNOSIS — I471 Supraventricular tachycardia: Secondary | ICD-10-CM | POA: Diagnosis not present

## 2018-11-13 DIAGNOSIS — R7303 Prediabetes: Secondary | ICD-10-CM | POA: Diagnosis not present

## 2018-11-13 DIAGNOSIS — R202 Paresthesia of skin: Secondary | ICD-10-CM | POA: Diagnosis not present

## 2018-11-13 DIAGNOSIS — E039 Hypothyroidism, unspecified: Secondary | ICD-10-CM | POA: Diagnosis not present

## 2018-11-13 DIAGNOSIS — N182 Chronic kidney disease, stage 2 (mild): Secondary | ICD-10-CM | POA: Diagnosis not present

## 2018-11-29 DIAGNOSIS — H906 Mixed conductive and sensorineural hearing loss, bilateral: Secondary | ICD-10-CM | POA: Diagnosis not present

## 2018-12-07 DIAGNOSIS — D225 Melanocytic nevi of trunk: Secondary | ICD-10-CM | POA: Diagnosis not present

## 2018-12-07 DIAGNOSIS — C44519 Basal cell carcinoma of skin of other part of trunk: Secondary | ICD-10-CM | POA: Diagnosis not present

## 2018-12-07 DIAGNOSIS — C44319 Basal cell carcinoma of skin of other parts of face: Secondary | ICD-10-CM | POA: Diagnosis not present

## 2018-12-07 DIAGNOSIS — L821 Other seborrheic keratosis: Secondary | ICD-10-CM | POA: Diagnosis not present

## 2018-12-27 DIAGNOSIS — H353231 Exudative age-related macular degeneration, bilateral, with active choroidal neovascularization: Secondary | ICD-10-CM | POA: Diagnosis not present

## 2019-01-03 DIAGNOSIS — Z23 Encounter for immunization: Secondary | ICD-10-CM | POA: Diagnosis not present

## 2019-01-18 DIAGNOSIS — Z08 Encounter for follow-up examination after completed treatment for malignant neoplasm: Secondary | ICD-10-CM | POA: Diagnosis not present

## 2019-01-18 DIAGNOSIS — Z85828 Personal history of other malignant neoplasm of skin: Secondary | ICD-10-CM | POA: Diagnosis not present

## 2019-02-11 DIAGNOSIS — H43813 Vitreous degeneration, bilateral: Secondary | ICD-10-CM | POA: Diagnosis not present

## 2019-02-11 DIAGNOSIS — H35033 Hypertensive retinopathy, bilateral: Secondary | ICD-10-CM | POA: Diagnosis not present

## 2019-02-11 DIAGNOSIS — H353231 Exudative age-related macular degeneration, bilateral, with active choroidal neovascularization: Secondary | ICD-10-CM | POA: Diagnosis not present

## 2019-04-08 DIAGNOSIS — H35033 Hypertensive retinopathy, bilateral: Secondary | ICD-10-CM | POA: Diagnosis not present

## 2019-04-08 DIAGNOSIS — H43813 Vitreous degeneration, bilateral: Secondary | ICD-10-CM | POA: Diagnosis not present

## 2019-04-08 DIAGNOSIS — H353231 Exudative age-related macular degeneration, bilateral, with active choroidal neovascularization: Secondary | ICD-10-CM | POA: Diagnosis not present

## 2019-04-17 DIAGNOSIS — E782 Mixed hyperlipidemia: Secondary | ICD-10-CM | POA: Diagnosis not present

## 2019-04-17 DIAGNOSIS — M1990 Unspecified osteoarthritis, unspecified site: Secondary | ICD-10-CM | POA: Diagnosis not present

## 2019-04-17 DIAGNOSIS — M179 Osteoarthritis of knee, unspecified: Secondary | ICD-10-CM | POA: Diagnosis not present

## 2019-04-17 DIAGNOSIS — E039 Hypothyroidism, unspecified: Secondary | ICD-10-CM | POA: Diagnosis not present

## 2019-04-17 DIAGNOSIS — I10 Essential (primary) hypertension: Secondary | ICD-10-CM | POA: Diagnosis not present

## 2019-04-17 DIAGNOSIS — N182 Chronic kidney disease, stage 2 (mild): Secondary | ICD-10-CM | POA: Diagnosis not present

## 2019-04-17 DIAGNOSIS — N400 Enlarged prostate without lower urinary tract symptoms: Secondary | ICD-10-CM | POA: Diagnosis not present

## 2019-04-20 ENCOUNTER — Ambulatory Visit: Payer: Medicare Other | Attending: Internal Medicine

## 2019-04-20 DIAGNOSIS — Z23 Encounter for immunization: Secondary | ICD-10-CM

## 2019-04-20 NOTE — Progress Notes (Signed)
   Covid-19 Vaccination Clinic  Name:  DELORIAN SABIA    MRN: [id number] DOB: 21-Jul-1932  04/20/2019  Mr. Lanes was observed post Covid-19 immunization for 30 minutes based on pre-vaccination screening without incidence. He was provided with Vaccine Information Sheet and instruction to access the V-Safe system.   Mr. Finken was instructed to call 911 with any severe reactions post vaccine: Marland Kitchen Difficulty breathing  . Swelling of your face and throat  . A fast heartbeat  . A bad rash all over your body  . Dizziness and weakness    Immunizations Administered    Name Date Dose VIS Date Route   Pfizer COVID-19 Vaccine 04/20/2019  5:00 PM 0.3 mL 02/22/2019 Intramuscular   Manufacturer: [place]   Lot: [id number]   [place]: [id number]

## 2019-05-08 DIAGNOSIS — D696 Thrombocytopenia, unspecified: Secondary | ICD-10-CM | POA: Diagnosis not present

## 2019-05-08 DIAGNOSIS — E782 Mixed hyperlipidemia: Secondary | ICD-10-CM | POA: Diagnosis not present

## 2019-05-08 DIAGNOSIS — R7309 Other abnormal glucose: Secondary | ICD-10-CM | POA: Diagnosis not present

## 2019-05-08 DIAGNOSIS — M109 Gout, unspecified: Secondary | ICD-10-CM | POA: Diagnosis not present

## 2019-05-08 DIAGNOSIS — I10 Essential (primary) hypertension: Secondary | ICD-10-CM | POA: Diagnosis not present

## 2019-05-08 DIAGNOSIS — Z0000 Encounter for general adult medical examination without abnormal findings: Secondary | ICD-10-CM | POA: Diagnosis not present

## 2019-05-08 DIAGNOSIS — Z1389 Encounter for screening for other disorder: Secondary | ICD-10-CM | POA: Diagnosis not present

## 2019-05-08 DIAGNOSIS — H8110 Benign paroxysmal vertigo, unspecified ear: Secondary | ICD-10-CM | POA: Diagnosis not present

## 2019-05-08 DIAGNOSIS — K219 Gastro-esophageal reflux disease without esophagitis: Secondary | ICD-10-CM | POA: Diagnosis not present

## 2019-05-08 DIAGNOSIS — E039 Hypothyroidism, unspecified: Secondary | ICD-10-CM | POA: Diagnosis not present

## 2019-05-08 DIAGNOSIS — N182 Chronic kidney disease, stage 2 (mild): Secondary | ICD-10-CM | POA: Diagnosis not present

## 2019-05-08 DIAGNOSIS — I471 Supraventricular tachycardia: Secondary | ICD-10-CM | POA: Diagnosis not present

## 2019-05-08 DIAGNOSIS — N400 Enlarged prostate without lower urinary tract symptoms: Secondary | ICD-10-CM | POA: Diagnosis not present

## 2019-05-08 DIAGNOSIS — I700 Atherosclerosis of aorta: Secondary | ICD-10-CM | POA: Diagnosis not present

## 2019-05-10 ENCOUNTER — Ambulatory Visit: Payer: Medicare Other

## 2019-05-15 ENCOUNTER — Ambulatory Visit: Payer: Medicare Other | Attending: Internal Medicine

## 2019-05-15 DIAGNOSIS — Z23 Encounter for immunization: Secondary | ICD-10-CM | POA: Insufficient documentation

## 2019-05-15 NOTE — Progress Notes (Signed)
   Covid-19 Vaccination Clinic  Name:  ROCKET WOLFE    MRN: [id number] DOB: [birth year]  05/15/2019  Mr. Fine was observed post Covid-19 immunization for 15 minutes without incident. He was provided with Vaccine Information Sheet and instruction to access the V-Safe system.   Mr. Helmes was instructed to call 911 with any severe reactions post vaccine: Marland Kitchen Difficulty breathing  . Swelling of face and throat  . A fast heartbeat  . A bad rash all over body  . Dizziness and weakness   Immunizations Administered    Name Date Dose VIS Date Route   Pfizer COVID-19 Vaccine 05/15/2019  2:31 PM 0.3 mL 02/22/2019 Intramuscular   Manufacturer: [place]   Lot: [id number]   [place]: [id number]

## 2019-05-16 DIAGNOSIS — N400 Enlarged prostate without lower urinary tract symptoms: Secondary | ICD-10-CM | POA: Diagnosis not present

## 2019-05-16 DIAGNOSIS — M1990 Unspecified osteoarthritis, unspecified site: Secondary | ICD-10-CM | POA: Diagnosis not present

## 2019-05-16 DIAGNOSIS — N182 Chronic kidney disease, stage 2 (mild): Secondary | ICD-10-CM | POA: Diagnosis not present

## 2019-05-16 DIAGNOSIS — E039 Hypothyroidism, unspecified: Secondary | ICD-10-CM | POA: Diagnosis not present

## 2019-05-16 DIAGNOSIS — E782 Mixed hyperlipidemia: Secondary | ICD-10-CM | POA: Diagnosis not present

## 2019-05-16 DIAGNOSIS — M179 Osteoarthritis of knee, unspecified: Secondary | ICD-10-CM | POA: Diagnosis not present

## 2019-05-16 DIAGNOSIS — I10 Essential (primary) hypertension: Secondary | ICD-10-CM | POA: Diagnosis not present

## 2019-06-20 DIAGNOSIS — H353231 Exudative age-related macular degeneration, bilateral, with active choroidal neovascularization: Secondary | ICD-10-CM | POA: Diagnosis not present

## 2019-07-08 DIAGNOSIS — E039 Hypothyroidism, unspecified: Secondary | ICD-10-CM | POA: Diagnosis not present

## 2019-11-19 DIAGNOSIS — R7303 Prediabetes: Secondary | ICD-10-CM | POA: Diagnosis not present

## 2019-11-19 DIAGNOSIS — H8110 Benign paroxysmal vertigo, unspecified ear: Secondary | ICD-10-CM | POA: Diagnosis not present

## 2019-11-19 DIAGNOSIS — G629 Polyneuropathy, unspecified: Secondary | ICD-10-CM | POA: Diagnosis not present

## 2019-11-19 DIAGNOSIS — D696 Thrombocytopenia, unspecified: Secondary | ICD-10-CM | POA: Diagnosis not present

## 2019-11-19 DIAGNOSIS — I700 Atherosclerosis of aorta: Secondary | ICD-10-CM | POA: Diagnosis not present

## 2019-11-19 DIAGNOSIS — I10 Essential (primary) hypertension: Secondary | ICD-10-CM | POA: Diagnosis not present

## 2019-11-19 DIAGNOSIS — I471 Supraventricular tachycardia: Secondary | ICD-10-CM | POA: Diagnosis not present

## 2019-11-19 DIAGNOSIS — M109 Gout, unspecified: Secondary | ICD-10-CM | POA: Diagnosis not present

## 2019-11-19 DIAGNOSIS — E039 Hypothyroidism, unspecified: Secondary | ICD-10-CM | POA: Diagnosis not present

## 2019-11-19 DIAGNOSIS — N182 Chronic kidney disease, stage 2 (mild): Secondary | ICD-10-CM | POA: Diagnosis not present

## 2019-11-28 DIAGNOSIS — H353231 Exudative age-related macular degeneration, bilateral, with active choroidal neovascularization: Secondary | ICD-10-CM | POA: Diagnosis not present

## 2019-11-28 DIAGNOSIS — H35033 Hypertensive retinopathy, bilateral: Secondary | ICD-10-CM | POA: Diagnosis not present

## 2019-11-28 DIAGNOSIS — H43393 Other vitreous opacities, bilateral: Secondary | ICD-10-CM | POA: Diagnosis not present

## 2019-11-28 DIAGNOSIS — H43813 Vitreous degeneration, bilateral: Secondary | ICD-10-CM | POA: Diagnosis not present

## 2019-12-05 DIAGNOSIS — H906 Mixed conductive and sensorineural hearing loss, bilateral: Secondary | ICD-10-CM | POA: Diagnosis not present

## 2019-12-19 DIAGNOSIS — Z23 Encounter for immunization: Secondary | ICD-10-CM | POA: Diagnosis not present

## 2020-01-20 DIAGNOSIS — Z23 Encounter for immunization: Secondary | ICD-10-CM | POA: Diagnosis not present

## 2020-02-03 DIAGNOSIS — H43393 Other vitreous opacities, bilateral: Secondary | ICD-10-CM | POA: Diagnosis not present

## 2020-02-03 DIAGNOSIS — H35423 Microcystoid degeneration of retina, bilateral: Secondary | ICD-10-CM | POA: Diagnosis not present

## 2020-02-03 DIAGNOSIS — H353231 Exudative age-related macular degeneration, bilateral, with active choroidal neovascularization: Secondary | ICD-10-CM | POA: Diagnosis not present

## 2020-02-03 DIAGNOSIS — H43813 Vitreous degeneration, bilateral: Secondary | ICD-10-CM | POA: Diagnosis not present

## 2020-03-24 DIAGNOSIS — Z96651 Presence of right artificial knee joint: Secondary | ICD-10-CM | POA: Diagnosis not present

## 2020-04-06 DIAGNOSIS — H43393 Other vitreous opacities, bilateral: Secondary | ICD-10-CM | POA: Diagnosis not present

## 2020-04-06 DIAGNOSIS — H35033 Hypertensive retinopathy, bilateral: Secondary | ICD-10-CM | POA: Diagnosis not present

## 2020-04-06 DIAGNOSIS — H353231 Exudative age-related macular degeneration, bilateral, with active choroidal neovascularization: Secondary | ICD-10-CM | POA: Diagnosis not present

## 2020-04-06 DIAGNOSIS — H43813 Vitreous degeneration, bilateral: Secondary | ICD-10-CM | POA: Diagnosis not present

## 2020-05-20 DIAGNOSIS — D696 Thrombocytopenia, unspecified: Secondary | ICD-10-CM | POA: Diagnosis not present

## 2020-05-20 DIAGNOSIS — R7303 Prediabetes: Secondary | ICD-10-CM | POA: Diagnosis not present

## 2020-05-20 DIAGNOSIS — E782 Mixed hyperlipidemia: Secondary | ICD-10-CM | POA: Diagnosis not present

## 2020-05-20 DIAGNOSIS — I10 Essential (primary) hypertension: Secondary | ICD-10-CM | POA: Diagnosis not present

## 2020-05-20 DIAGNOSIS — Z1389 Encounter for screening for other disorder: Secondary | ICD-10-CM | POA: Diagnosis not present

## 2020-05-20 DIAGNOSIS — N182 Chronic kidney disease, stage 2 (mild): Secondary | ICD-10-CM | POA: Diagnosis not present

## 2020-05-20 DIAGNOSIS — Z0000 Encounter for general adult medical examination without abnormal findings: Secondary | ICD-10-CM | POA: Diagnosis not present

## 2020-05-20 DIAGNOSIS — I471 Supraventricular tachycardia: Secondary | ICD-10-CM | POA: Diagnosis not present

## 2020-05-20 DIAGNOSIS — E039 Hypothyroidism, unspecified: Secondary | ICD-10-CM | POA: Diagnosis not present

## 2020-05-20 DIAGNOSIS — M109 Gout, unspecified: Secondary | ICD-10-CM | POA: Diagnosis not present

## 2020-05-20 DIAGNOSIS — H3530 Unspecified macular degeneration: Secondary | ICD-10-CM | POA: Diagnosis not present

## 2020-05-20 DIAGNOSIS — G629 Polyneuropathy, unspecified: Secondary | ICD-10-CM | POA: Diagnosis not present

## 2020-05-20 DIAGNOSIS — I700 Atherosclerosis of aorta: Secondary | ICD-10-CM | POA: Diagnosis not present

## 2020-05-20 DIAGNOSIS — M179 Osteoarthritis of knee, unspecified: Secondary | ICD-10-CM | POA: Diagnosis not present

## 2020-05-25 DIAGNOSIS — H35033 Hypertensive retinopathy, bilateral: Secondary | ICD-10-CM | POA: Diagnosis not present

## 2020-05-25 DIAGNOSIS — H353231 Exudative age-related macular degeneration, bilateral, with active choroidal neovascularization: Secondary | ICD-10-CM | POA: Diagnosis not present

## 2020-05-25 DIAGNOSIS — H43813 Vitreous degeneration, bilateral: Secondary | ICD-10-CM | POA: Diagnosis not present

## 2020-05-25 DIAGNOSIS — H35423 Microcystoid degeneration of retina, bilateral: Secondary | ICD-10-CM | POA: Diagnosis not present

## 2020-06-03 DIAGNOSIS — Z1211 Encounter for screening for malignant neoplasm of colon: Secondary | ICD-10-CM | POA: Diagnosis not present

## 2020-06-10 DIAGNOSIS — D225 Melanocytic nevi of trunk: Secondary | ICD-10-CM | POA: Diagnosis not present

## 2020-06-10 DIAGNOSIS — X32XXXD Exposure to sunlight, subsequent encounter: Secondary | ICD-10-CM | POA: Diagnosis not present

## 2020-06-10 DIAGNOSIS — C44612 Basal cell carcinoma of skin of right upper limb, including shoulder: Secondary | ICD-10-CM | POA: Diagnosis not present

## 2020-06-10 DIAGNOSIS — Z08 Encounter for follow-up examination after completed treatment for malignant neoplasm: Secondary | ICD-10-CM | POA: Diagnosis not present

## 2020-06-10 DIAGNOSIS — L570 Actinic keratosis: Secondary | ICD-10-CM | POA: Diagnosis not present

## 2020-06-10 DIAGNOSIS — Z85828 Personal history of other malignant neoplasm of skin: Secondary | ICD-10-CM | POA: Diagnosis not present

## 2020-07-20 DIAGNOSIS — H353231 Exudative age-related macular degeneration, bilateral, with active choroidal neovascularization: Secondary | ICD-10-CM | POA: Diagnosis not present

## 2020-07-20 DIAGNOSIS — H35033 Hypertensive retinopathy, bilateral: Secondary | ICD-10-CM | POA: Diagnosis not present

## 2020-07-20 DIAGNOSIS — H35423 Microcystoid degeneration of retina, bilateral: Secondary | ICD-10-CM | POA: Diagnosis not present

## 2020-07-20 DIAGNOSIS — H43813 Vitreous degeneration, bilateral: Secondary | ICD-10-CM | POA: Diagnosis not present

## 2020-08-05 DIAGNOSIS — Z85828 Personal history of other malignant neoplasm of skin: Secondary | ICD-10-CM | POA: Diagnosis not present

## 2020-08-05 DIAGNOSIS — Z08 Encounter for follow-up examination after completed treatment for malignant neoplasm: Secondary | ICD-10-CM | POA: Diagnosis not present

## 2020-09-03 DIAGNOSIS — H353231 Exudative age-related macular degeneration, bilateral, with active choroidal neovascularization: Secondary | ICD-10-CM | POA: Diagnosis not present

## 2020-10-19 DIAGNOSIS — H43393 Other vitreous opacities, bilateral: Secondary | ICD-10-CM | POA: Diagnosis not present

## 2020-10-19 DIAGNOSIS — H35423 Microcystoid degeneration of retina, bilateral: Secondary | ICD-10-CM | POA: Diagnosis not present

## 2020-10-19 DIAGNOSIS — H43813 Vitreous degeneration, bilateral: Secondary | ICD-10-CM | POA: Diagnosis not present

## 2020-10-19 DIAGNOSIS — H353231 Exudative age-related macular degeneration, bilateral, with active choroidal neovascularization: Secondary | ICD-10-CM | POA: Diagnosis not present

## 2020-11-23 DIAGNOSIS — E039 Hypothyroidism, unspecified: Secondary | ICD-10-CM | POA: Diagnosis not present

## 2020-11-23 DIAGNOSIS — G629 Polyneuropathy, unspecified: Secondary | ICD-10-CM | POA: Diagnosis not present

## 2020-11-23 DIAGNOSIS — M109 Gout, unspecified: Secondary | ICD-10-CM | POA: Diagnosis not present

## 2020-11-23 DIAGNOSIS — R7303 Prediabetes: Secondary | ICD-10-CM | POA: Diagnosis not present

## 2020-11-23 DIAGNOSIS — K5900 Constipation, unspecified: Secondary | ICD-10-CM | POA: Diagnosis not present

## 2020-11-23 DIAGNOSIS — I471 Supraventricular tachycardia: Secondary | ICD-10-CM | POA: Diagnosis not present

## 2020-11-23 DIAGNOSIS — I10 Essential (primary) hypertension: Secondary | ICD-10-CM | POA: Diagnosis not present

## 2020-11-23 DIAGNOSIS — Z23 Encounter for immunization: Secondary | ICD-10-CM | POA: Diagnosis not present

## 2020-11-23 DIAGNOSIS — D696 Thrombocytopenia, unspecified: Secondary | ICD-10-CM | POA: Diagnosis not present

## 2020-11-23 DIAGNOSIS — I700 Atherosclerosis of aorta: Secondary | ICD-10-CM | POA: Diagnosis not present

## 2020-11-23 DIAGNOSIS — E782 Mixed hyperlipidemia: Secondary | ICD-10-CM | POA: Diagnosis not present

## 2020-11-27 DIAGNOSIS — H906 Mixed conductive and sensorineural hearing loss, bilateral: Secondary | ICD-10-CM | POA: Diagnosis not present

## 2020-12-17 DIAGNOSIS — H35423 Microcystoid degeneration of retina, bilateral: Secondary | ICD-10-CM | POA: Diagnosis not present

## 2020-12-17 DIAGNOSIS — H353231 Exudative age-related macular degeneration, bilateral, with active choroidal neovascularization: Secondary | ICD-10-CM | POA: Diagnosis not present

## 2020-12-17 DIAGNOSIS — H43813 Vitreous degeneration, bilateral: Secondary | ICD-10-CM | POA: Diagnosis not present

## 2020-12-17 DIAGNOSIS — H35033 Hypertensive retinopathy, bilateral: Secondary | ICD-10-CM | POA: Diagnosis not present

## 2021-02-08 DIAGNOSIS — H353231 Exudative age-related macular degeneration, bilateral, with active choroidal neovascularization: Secondary | ICD-10-CM | POA: Diagnosis not present

## 2021-02-08 DIAGNOSIS — H35033 Hypertensive retinopathy, bilateral: Secondary | ICD-10-CM | POA: Diagnosis not present

## 2021-02-08 DIAGNOSIS — H35423 Microcystoid degeneration of retina, bilateral: Secondary | ICD-10-CM | POA: Diagnosis not present

## 2021-02-08 DIAGNOSIS — H43813 Vitreous degeneration, bilateral: Secondary | ICD-10-CM | POA: Diagnosis not present

## 2021-04-22 DIAGNOSIS — H353231 Exudative age-related macular degeneration, bilateral, with active choroidal neovascularization: Secondary | ICD-10-CM | POA: Diagnosis not present

## 2021-04-22 DIAGNOSIS — H43813 Vitreous degeneration, bilateral: Secondary | ICD-10-CM | POA: Diagnosis not present

## 2021-04-22 DIAGNOSIS — H3581 Retinal edema: Secondary | ICD-10-CM | POA: Diagnosis not present

## 2021-04-22 DIAGNOSIS — H43393 Other vitreous opacities, bilateral: Secondary | ICD-10-CM | POA: Diagnosis not present

## 2021-06-03 DIAGNOSIS — N182 Chronic kidney disease, stage 2 (mild): Secondary | ICD-10-CM | POA: Diagnosis not present

## 2021-06-03 DIAGNOSIS — I471 Supraventricular tachycardia: Secondary | ICD-10-CM | POA: Diagnosis not present

## 2021-06-03 DIAGNOSIS — Z0000 Encounter for general adult medical examination without abnormal findings: Secondary | ICD-10-CM | POA: Diagnosis not present

## 2021-06-03 DIAGNOSIS — I10 Essential (primary) hypertension: Secondary | ICD-10-CM | POA: Diagnosis not present

## 2021-06-03 DIAGNOSIS — D696 Thrombocytopenia, unspecified: Secondary | ICD-10-CM | POA: Diagnosis not present

## 2021-06-03 DIAGNOSIS — Z1389 Encounter for screening for other disorder: Secondary | ICD-10-CM | POA: Diagnosis not present

## 2021-06-03 DIAGNOSIS — N400 Enlarged prostate without lower urinary tract symptoms: Secondary | ICD-10-CM | POA: Diagnosis not present

## 2021-06-03 DIAGNOSIS — I700 Atherosclerosis of aorta: Secondary | ICD-10-CM | POA: Diagnosis not present

## 2021-06-03 DIAGNOSIS — G629 Polyneuropathy, unspecified: Secondary | ICD-10-CM | POA: Diagnosis not present

## 2021-06-03 DIAGNOSIS — E782 Mixed hyperlipidemia: Secondary | ICD-10-CM | POA: Diagnosis not present

## 2021-06-03 DIAGNOSIS — R7303 Prediabetes: Secondary | ICD-10-CM | POA: Diagnosis not present

## 2021-06-03 DIAGNOSIS — E039 Hypothyroidism, unspecified: Secondary | ICD-10-CM | POA: Diagnosis not present

## 2021-06-03 DIAGNOSIS — M109 Gout, unspecified: Secondary | ICD-10-CM | POA: Diagnosis not present

## 2021-06-21 DIAGNOSIS — H43393 Other vitreous opacities, bilateral: Secondary | ICD-10-CM | POA: Diagnosis not present

## 2021-06-21 DIAGNOSIS — H43813 Vitreous degeneration, bilateral: Secondary | ICD-10-CM | POA: Diagnosis not present

## 2021-06-21 DIAGNOSIS — H353231 Exudative age-related macular degeneration, bilateral, with active choroidal neovascularization: Secondary | ICD-10-CM | POA: Diagnosis not present

## 2021-06-21 DIAGNOSIS — R945 Abnormal results of liver function studies: Secondary | ICD-10-CM | POA: Diagnosis not present

## 2021-06-21 DIAGNOSIS — H35033 Hypertensive retinopathy, bilateral: Secondary | ICD-10-CM | POA: Diagnosis not present

## 2021-07-02 DIAGNOSIS — Z9049 Acquired absence of other specified parts of digestive tract: Secondary | ICD-10-CM | POA: Diagnosis not present

## 2021-07-02 DIAGNOSIS — R161 Splenomegaly, not elsewhere classified: Secondary | ICD-10-CM | POA: Diagnosis not present

## 2021-07-02 DIAGNOSIS — R945 Abnormal results of liver function studies: Secondary | ICD-10-CM | POA: Diagnosis not present

## 2021-07-13 DIAGNOSIS — D225 Melanocytic nevi of trunk: Secondary | ICD-10-CM | POA: Diagnosis not present

## 2021-07-13 DIAGNOSIS — L820 Inflamed seborrheic keratosis: Secondary | ICD-10-CM | POA: Diagnosis not present

## 2021-07-13 DIAGNOSIS — L308 Other specified dermatitis: Secondary | ICD-10-CM | POA: Diagnosis not present

## 2021-07-13 DIAGNOSIS — B078 Other viral warts: Secondary | ICD-10-CM | POA: Diagnosis not present

## 2021-07-13 DIAGNOSIS — X32XXXD Exposure to sunlight, subsequent encounter: Secondary | ICD-10-CM | POA: Diagnosis not present

## 2021-07-13 DIAGNOSIS — L570 Actinic keratosis: Secondary | ICD-10-CM | POA: Diagnosis not present

## 2021-08-12 DIAGNOSIS — H353231 Exudative age-related macular degeneration, bilateral, with active choroidal neovascularization: Secondary | ICD-10-CM | POA: Diagnosis not present

## 2021-09-10 ENCOUNTER — Encounter (HOSPITAL_COMMUNITY): Payer: Self-pay

## 2021-09-10 ENCOUNTER — Emergency Department (HOSPITAL_COMMUNITY)
Admission: EM | Admit: 2021-09-10 | Discharge: 2021-09-10 | Disposition: A | Discharge status: Home / Self Care | Payer: Medicare Other | Attending: Emergency Medicine | Admitting: Emergency Medicine

## 2021-09-10 ENCOUNTER — Emergency Department (HOSPITAL_COMMUNITY): Payer: Medicare Other

## 2021-09-10 ENCOUNTER — Other Ambulatory Visit: Payer: Self-pay

## 2021-09-10 DIAGNOSIS — M25512 Pain in left shoulder: Secondary | ICD-10-CM | POA: Insufficient documentation

## 2021-09-10 DIAGNOSIS — I10 Essential (primary) hypertension: Secondary | ICD-10-CM | POA: Insufficient documentation

## 2021-09-10 DIAGNOSIS — W01198A Fall on same level from slipping, tripping and stumbling with subsequent striking against other object, initial encounter: Secondary | ICD-10-CM | POA: Diagnosis not present

## 2021-09-10 DIAGNOSIS — M542 Cervicalgia: Secondary | ICD-10-CM | POA: Diagnosis not present

## 2021-09-10 DIAGNOSIS — S199XXA Unspecified injury of neck, initial encounter: Secondary | ICD-10-CM | POA: Diagnosis present

## 2021-09-10 DIAGNOSIS — Z7982 Long term (current) use of aspirin: Secondary | ICD-10-CM | POA: Insufficient documentation

## 2021-09-10 DIAGNOSIS — R519 Headache, unspecified: Secondary | ICD-10-CM | POA: Diagnosis not present

## 2021-09-10 DIAGNOSIS — S0990XA Unspecified injury of head, initial encounter: Secondary | ICD-10-CM | POA: Diagnosis present

## 2021-09-10 DIAGNOSIS — S161XXA Strain of muscle, fascia and tendon at neck level, initial encounter: Secondary | ICD-10-CM | POA: Insufficient documentation

## 2021-09-10 DIAGNOSIS — S0001XA Abrasion of scalp, initial encounter: Secondary | ICD-10-CM | POA: Diagnosis not present

## 2021-09-10 DIAGNOSIS — Z79899 Other long term (current) drug therapy: Secondary | ICD-10-CM | POA: Insufficient documentation

## 2021-09-10 MED ORDER — LIDOCAINE 5 % EX PTCH
1.0000 | MEDICATED_PATCH | CUTANEOUS | Status: DC
  Administered 2021-09-10: 1 via TRANSDERMAL
  Filled 2021-09-10: qty 1

## 2021-09-10 MED ORDER — ACETAMINOPHEN 500 MG PO TABS
1000.0000 mg | ORAL_TABLET | Freq: Once | ORAL | Status: AC
  Administered 2021-09-10: 1000 mg via ORAL
  Filled 2021-09-10: qty 2

## 2021-09-10 NOTE — ED Triage Notes (Addendum)
Pt reports he tripped and fell forward onto concrete last Thursday, braced himself with his hands but did hit his head. No LOC. He has a scabbed over wound on his forehead. He stated he didn't think anything of it but yesterday the left side of the back of his neck has started hurting. Pain worse with movement, radiates behind his ear.  Denies taking blood thinners.

## 2021-09-10 NOTE — Discharge Instructions (Signed)
You may take 2 tablets of '325mg'$  of Tylenol every 6 hours as needed for pain. Please also use salonpas patches every 24 hours on your neck. If you develop any numbness, tingling, visual changes, or headaches, please return to the emergency room for evaluation.

## 2021-09-10 NOTE — ED Provider Triage Note (Signed)
Emergency Medicine Provider Triage Evaluation Note  Samuel Little , a 88 y.o. male  was evaluated in triage.  Pt complains of neck pain, inability to turn head after fall around 1 week ago.  Patient reports he fell forward onto the concrete, striking his fall with his hands, he felt fine for a few days, but has been working in the yard this week and reports that he started to have some sharp pain yesterday, woke up this morning and felt unable to turn his head or neck.  He has not taken anything for pain.  He tried Aspercreme x2 today with no relief.  He denies any numbness, tingling of arms or legs.  Review of Systems  Positive: Neck pain, head wound Negative: Numbness, tingling  Physical Exam  BP (!) 178/77 (BP Location: Left Arm)   Pulse 73   Temp 97.8 F (36.6 C) (Oral)   Resp 20   Ht '6\' 1"'$  (1.854 m)   Wt 93.4 kg   SpO2 96%   BMI 27.18 kg/m  Gen:   Awake, no distress   Resp:  Normal effort  MSK:   Moves extremities without difficulty  Other:  Patient has tenderness palpation in proximal cervical spine, tenderness to palpation in paraspinous muscles, patient is unable to actively turn his neck secondary to pain, he has a healing wound on his forehead with evidence of hematoma  Moves all 4 limbs spontaneously, 5 out of 5 strength bilateral lower and upper extremities.  Medical Decision Making  Medically screening exam initiated at 6:02 PM.  Appropriate orders placed.  [place] Everlean Cherry was informed that the remainder of the evaluation will be completed by another provider, this initial triage assessment does not replace that evaluation, and the importance of remaining in the ED until their evaluation is complete.  Workup initiated, patient placed in c collar   Anselmo Pickler, PA-C 09/10/21 1807

## 2021-09-10 NOTE — ED Provider Notes (Signed)
[facility] EMERGENCY DEPARTMENT Provider Note   CSN: [id number] Arrival date & time: 09/10/21  1735     History  Chief Complaint  Patient presents with   Neck Pain    Samuel Little is a 88 y.o. male.  88 year old male with a past medical history of hypertension, gout presents the ED with left-sided neck pain.  Patient states that 1 week ago, he sustained a ground-level fall where he hit the top of his head on the wall.  He denies losing consciousness or sustaining any neck pain at that time.  Yesterday he was outside hunched over removing a pot of grass when he developed left-sided neck pain that extends down into his left shoulder and head.  He states that he tried Aspercreme without improvement of symptoms.  He states that he has been having significant difficulty ranging his neck.  He denies associated headaches, dizziness, vision changes, numbness, weakness of his extremities.  He states that he has otherwise been in his normal state of health.  The history is provided by the patient and the spouse.       Home Medications Prior to Admission medications   Medication Sig Start Date End Date Taking? Authorizing Provider  acetaminophen (TYLENOL) 650 MG CR tablet Take 1,300 mg every 8 (eight) hours as needed by mouth for pain.     [provider]  Ascorbic Acid (VITAMIN C) 500 MG CAPS Take 500 mg by mouth daily.     [provider]  aspirin EC 325 MG EC tablet Take 1 tablet (325 mg total) by mouth 2 (two) times daily. 02/14/17   Donia Ast, PA  Bilberry 100 MG CAPS Take 100 mg daily by mouth.     [provider]  bisacodyl (DULCOLAX) 5 MG EC tablet Take 10 mg daily as needed by mouth for moderate constipation.    [provider]  Calcium-Vitamin D (CALTRATE 600 PLUS-VIT D PO) Take 1 tablet by mouth daily.     [provider]  cholecalciferol (VITAMIN D) 1000 units tablet Take 1,000 Units daily by mouth.      [provider]  Coenzyme Q10 (CO Q 10 PO) Take 120 mg daily by mouth.     [provider]  colchicine 0.6 MG tablet Take 1 tablet (0.6 mg total) by mouth daily as needed (gout). Please take daily for next 5 days until gout flare is resolved, then as needed Patient taking differently: Take 0.6 mg daily as needed by mouth (for gout).  06/28/16   Rai, Vernelle Emerald, MD  CORAL CALCIUM PO Take 1,000 mg daily by mouth.     [provider]  CRANBERRY PO Take 500 mg daily by mouth.     [provider]  Cyanocobalamin (VITAMIN B-12 PO) Take 1 tablet daily by mouth.    [provider]  doxazosin (CARDURA) 4 MG tablet Take 4 mg by mouth at bedtime.     [provider]  GINKGO BILOBA PO Take 120 mg daily by mouth.     [provider]  Glucos-Chondroit-Hyaluron-MSM (GLUCOSAMINE CHONDROITIN JOINT PO) Take 2 tablets daily by mouth.     [provider]  levothyroxine (SYNTHROID, LEVOTHROID) 175 MCG tablet Take 175 mcg by mouth daily before breakfast.    [provider]  lisinopril (PRINIVIL,ZESTRIL) 20 MG tablet Take 1 tablet (20 mg total) by mouth daily. 06/28/16   Rai, Vernelle Emerald, MD  Lutein 6 MG CAPS Take 6 mg  daily by mouth.    [provider]  methocarbamol (ROBAXIN) 500 MG tablet Take 1-2 tablets (500-1,000 mg total) by mouth every 6 (six) hours as needed for muscle spasms. 02/14/17   Donia Ast, PA  Misc Natural Products (BLACK CHERRY CONCENTRATE PO) Take 1 tablet by mouth daily.     [provider]  naloxegol oxalate (MOVANTIK) 25 MG TABS tablet Take 1 tablet (25 mg total) by mouth daily. 02/14/17   Donia Ast, PA  Omega-3 Fatty Acids (FISH OIL) 1000 MG CAPS Take 1,000 mg daily by mouth.    [provider]  OVER THE COUNTER MEDICATION Take 2 tablets 2 (two) times daily by mouth. Macular Protect Complete Supplement    [provider]  oxyCODONE 10 MG TABS Take 1 tablet (10 mg  total) by mouth every 4 (four) hours as needed for moderate pain ((score 4 to 6)). 02/14/17   Donia Ast, PA  polyethylene glycol [facility] / Floria Raveling) packet Take 17 g daily as needed by mouth for moderate constipation.    [provider]  RESVERATROL PO Take 1 tablet by mouth daily.     [provider]  senna (SENOKOT) 8.6 MG TABS tablet Take 1 tablet (8.6 mg total) by mouth at bedtime as needed for mild constipation. Patient not taking: Reported on 01/23/2017 12/20/13   Dhungel, Flonnie Overman, MD  Trolamine Salicylate (ASPERCREME EX) Apply 1 application as needed topically (for muscle pain).    [provider]  Turmeric 500 MG CAPS Take 500 mg 2 (two) times daily by mouth.     [provider]      Allergies    Nsaids, Celecoxib, and Meperidine hcl    Review of Systems   Review of Systems  Constitutional:  Negative for fever.  Eyes:  Negative for visual disturbance.  Respiratory:  Negative for shortness of breath.   Cardiovascular:  Negative for chest pain.  Gastrointestinal:  Negative for nausea and vomiting.  Musculoskeletal:  Positive for neck pain and neck stiffness.  Neurological:  Negative for dizziness, syncope, weakness, light-headedness, numbness and headaches.    Physical Exam Updated Vital Signs BP (!) 185/74   Pulse 61   Temp 97.8 F (36.6 C) (Oral)   Resp 17   Ht '6\' 1"'$  (1.854 m)   Wt 93.4 kg   SpO2 97%   BMI 27.18 kg/m  Physical Exam Vitals and nursing note reviewed.  Constitutional:      General: He is not in acute distress.    Appearance: Normal appearance. He is well-developed. He is not ill-appearing.  HENT:     Head: Normocephalic.     Comments: Superficial abrasion and scabbing noted to the vertex of the scalp. No open wounds noted.    Mouth/Throat:     Mouth: Mucous membranes are moist.     Pharynx: Oropharynx is clear.  Eyes:     Extraocular Movements: Extraocular movements intact.     Conjunctiva/sclera:  Conjunctivae normal.     Pupils: Pupils are equal, round, and reactive to light.  Neck:     Vascular: No carotid bruit.     Comments: Point tenderness to palpation overlying the left lateral trapezius.  Tenderness extends more caudally into the insertion at the occiput.  Limited active and passive range of motion in the horizontal and vertical planes secondary to pain.  No midline cervical spine tenderness, no step-offs or deformities. Cardiovascular:     Rate and Rhythm: Normal rate and regular  rhythm.     Heart sounds: No murmur heard. Pulmonary:     Effort: Pulmonary effort is normal. No respiratory distress.     Breath sounds: Normal breath sounds.  Abdominal:     Palpations: Abdomen is soft.     Tenderness: There is no abdominal tenderness.  Musculoskeletal:        General: No swelling.     Cervical back: Tenderness present. Muscular tenderness present. No spinous process tenderness.  Skin:    General: Skin is warm and dry.  Neurological:     Mental Status: He is alert.     Comments: Awake, alert.  No facial asymmetry or dysarthria.  Sensation is equal and intact to light touch in the bilateral V1-V3 distributions as well as the bilateral upper and lower extremities.  Strength is 5/5 to the proximal and distal bilateral upper and lower extremities.     ED Results / Procedures / Treatments   Labs (all labs ordered are listed, but only abnormal results are displayed) Labs Reviewed - No data to display  EKG None  Radiology CT Head Wo Contrast  Result Date: 09/10/2021 CLINICAL DATA:  88 y.o. male was evaluated in triage. Pt complains of neck pain, inability to turn head after fall around 1 week ago. Patient reports he fell forward onto the concrete, striking his fall with his hands, he felt fine for a few days, but has been working in the yard this week and reports that he started to have some sharp pain yesterday, woke up this morning and felt unable to turn his head or neck. He  has not taken anything for pain. He tried Aspercreme x2 today with no relief. He denies any numbness, tingling of arms or legs. EXAM: CT HEAD WITHOUT CONTRAST CT CERVICAL SPINE WITHOUT CONTRAST TECHNIQUE: Multidetector CT imaging of the head and cervical spine was performed following the standard protocol without intravenous contrast. Multiplanar CT image reconstructions of the cervical spine were also generated. RADIATION DOSE REDUCTION: This exam was performed according to the departmental dose-optimization program which includes automated exposure control, adjustment of the mA and/or kV according to patient size and/or use of iterative reconstruction technique. COMPARISON:  None Available. FINDINGS: CT HEAD FINDINGS Brain: No evidence of acute infarction, hemorrhage, hydrocephalus, extra-axial collection or mass lesion/mass effect. Patchy areas of white matter hypoattenuation are noted consistent with mild chronic microvascular ischemic change. Vascular: No hyperdense vessel or unexpected calcification. Skull: Normal. Negative for fracture or focal lesion. Sinuses/Orbits: Globes and orbits are unremarkable. Visualized sinuses are clear. Other: None. CT CERVICAL SPINE FINDINGS Alignment: Normal. Skull base and vertebrae: No acute fracture. No primary bone lesion or focal pathologic process. Soft tissues and spinal canal: No prevertebral fluid or swelling. No visible canal hematoma. Disc levels: Moderate loss of disc height at C3-C4, C5-C6 and C6-C7. Bridging anterior osteophytes at C2-C3 and C5 through T1. Mild disc bulging with endplate spurring. No convincing disc herniation. Facet degenerative changes bilaterally. Upper chest: No acute or significant abnormality. Other: None. IMPRESSION: HEAD CT 1. No acute intracranial abnormalities. CERVICAL CT 1. No fracture or acute finding. Electronically Signed   By: Lajean Manes M.D.   On: 09/10/2021 19:18   CT Cervical Spine Wo Contrast  Result Date:  09/10/2021 CLINICAL DATA:  88 y.o. male was evaluated in triage. Pt complains of neck pain, inability to turn head after fall around 1 week ago. Patient reports he fell forward onto the concrete, striking his fall with his hands, he felt fine  for a few days, but has been working in the yard this week and reports that he started to have some sharp pain yesterday, woke up this morning and felt unable to turn his head or neck. He has not taken anything for pain. He tried Aspercreme x2 today with no relief. He denies any numbness, tingling of arms or legs. EXAM: CT HEAD WITHOUT CONTRAST CT CERVICAL SPINE WITHOUT CONTRAST TECHNIQUE: Multidetector CT imaging of the head and cervical spine was performed following the standard protocol without intravenous contrast. Multiplanar CT image reconstructions of the cervical spine were also generated. RADIATION DOSE REDUCTION: This exam was performed according to the departmental dose-optimization program which includes automated exposure control, adjustment of the mA and/or kV according to patient size and/or use of iterative reconstruction technique. COMPARISON:  None Available. FINDINGS: CT HEAD FINDINGS Brain: No evidence of acute infarction, hemorrhage, hydrocephalus, extra-axial collection or mass lesion/mass effect. Patchy areas of white matter hypoattenuation are noted consistent with mild chronic microvascular ischemic change. Vascular: No hyperdense vessel or unexpected calcification. Skull: Normal. Negative for fracture or focal lesion. Sinuses/Orbits: Globes and orbits are unremarkable. Visualized sinuses are clear. Other: None. CT CERVICAL SPINE FINDINGS Alignment: Normal. Skull base and vertebrae: No acute fracture. No primary bone lesion or focal pathologic process. Soft tissues and spinal canal: No prevertebral fluid or swelling. No visible canal hematoma. Disc levels: Moderate loss of disc height at C3-C4, C5-C6 and C6-C7. Bridging anterior osteophytes at C2-C3 and  C5 through T1. Mild disc bulging with endplate spurring. No convincing disc herniation. Facet degenerative changes bilaterally. Upper chest: No acute or significant abnormality. Other: None. IMPRESSION: HEAD CT 1. No acute intracranial abnormalities. CERVICAL CT 1. No fracture or acute finding. Electronically Signed   By: Lajean Manes M.D.   On: 09/10/2021 19:18    Procedures Procedures    Medications Ordered in ED Medications  lidocaine (LIDODERM) 5 % 1 patch (1 patch Transdermal Patch Applied 09/10/21 2158)  acetaminophen (TYLENOL) tablet 1,000 mg (1,000 mg Oral Given 09/10/21 2158)    ED Course/ Medical Decision Making/ A&P                           Medical Decision Making Risk OTC drugs. Prescription drug management.   88 year old male with a history as above presents with 1 day of left-sided neck pain.  On arrival, patient is hypertensive but hemodynamically stable. Afebrile thus low suspicion for infectious etiology. He has significant point tenderness overlying his left lateral trapezius and extending more caudally into its insertion at the occiput.  Initially considered carotid artery dissection, however, he denies dizziness, visual disturbances, headaches, or neurologic symptoms.  He has no focal neurologic deficits or carotid bruit on exam. In the setting of his reproducible muscular tenderness and lack of neurologic findings, favor cervical strain.  No evidence for brachial plexopathy or Horner syndrome.  No direct trauma in this area to support intramuscular hematoma.  He has equal and strong symmetric pulses. CT head and cervical spine were obtained and negative for acute fractures or dislocations.  There is additionally no intracranial hemorrhages or skull fractures.  Patient was treated with Tylenol and lidocaine patch in the ED with improvement in his symptoms.  He was able to gently range his neck following these interventions.  I had a shared decision making with the patient  and wife at bedside who are both comfortable with discharge home with outpatient management.  Advised patient that he  will need very close follow-up with his primary care provider in the next week to ensure that symptoms are improving.  Advised on use of Tylenol, ice, heat and Salonpas patches at home as needed.  Strict ED return precautions given including development of numbness, weakness, visual changes, headaches.  At this time, patient is stable for discharge home.  Final Clinical Impression(s) / ED Diagnoses Final diagnoses:  Acute strain of neck muscle, initial encounter    Rx / DC Orders ED Discharge Orders     None         Laiyah Exline, [place], MD 09/10/21 5809    Luna Fuse, MD 09/11/21 [phone]

## 2021-10-07 DIAGNOSIS — H353231 Exudative age-related macular degeneration, bilateral, with active choroidal neovascularization: Secondary | ICD-10-CM | POA: Diagnosis not present

## 2021-10-07 DIAGNOSIS — H43393 Other vitreous opacities, bilateral: Secondary | ICD-10-CM | POA: Diagnosis not present

## 2021-10-07 DIAGNOSIS — H35033 Hypertensive retinopathy, bilateral: Secondary | ICD-10-CM | POA: Diagnosis not present

## 2021-10-07 DIAGNOSIS — H43813 Vitreous degeneration, bilateral: Secondary | ICD-10-CM | POA: Diagnosis not present

## 2021-11-25 DIAGNOSIS — J309 Allergic rhinitis, unspecified: Secondary | ICD-10-CM | POA: Diagnosis not present

## 2021-11-25 DIAGNOSIS — G629 Polyneuropathy, unspecified: Secondary | ICD-10-CM | POA: Diagnosis not present

## 2021-11-25 DIAGNOSIS — Z23 Encounter for immunization: Secondary | ICD-10-CM | POA: Diagnosis not present

## 2021-11-25 DIAGNOSIS — R945 Abnormal results of liver function studies: Secondary | ICD-10-CM | POA: Diagnosis not present

## 2021-11-25 DIAGNOSIS — D696 Thrombocytopenia, unspecified: Secondary | ICD-10-CM | POA: Diagnosis not present

## 2021-11-25 DIAGNOSIS — I700 Atherosclerosis of aorta: Secondary | ICD-10-CM | POA: Diagnosis not present

## 2021-11-25 DIAGNOSIS — H8110 Benign paroxysmal vertigo, unspecified ear: Secondary | ICD-10-CM | POA: Diagnosis not present

## 2021-11-25 DIAGNOSIS — R7989 Other specified abnormal findings of blood chemistry: Secondary | ICD-10-CM | POA: Diagnosis not present

## 2021-11-25 DIAGNOSIS — E039 Hypothyroidism, unspecified: Secondary | ICD-10-CM | POA: Diagnosis not present

## 2021-11-25 DIAGNOSIS — R7303 Prediabetes: Secondary | ICD-10-CM | POA: Diagnosis not present

## 2021-11-25 DIAGNOSIS — I10 Essential (primary) hypertension: Secondary | ICD-10-CM | POA: Diagnosis not present

## 2021-11-25 DIAGNOSIS — I471 Supraventricular tachycardia: Secondary | ICD-10-CM | POA: Diagnosis not present

## 2021-11-25 DIAGNOSIS — N182 Chronic kidney disease, stage 2 (mild): Secondary | ICD-10-CM | POA: Diagnosis not present

## 2021-12-16 DIAGNOSIS — H353231 Exudative age-related macular degeneration, bilateral, with active choroidal neovascularization: Secondary | ICD-10-CM | POA: Diagnosis not present

## 2021-12-16 DIAGNOSIS — H43813 Vitreous degeneration, bilateral: Secondary | ICD-10-CM | POA: Diagnosis not present

## 2021-12-16 DIAGNOSIS — H35033 Hypertensive retinopathy, bilateral: Secondary | ICD-10-CM | POA: Diagnosis not present

## 2021-12-16 DIAGNOSIS — H43393 Other vitreous opacities, bilateral: Secondary | ICD-10-CM | POA: Diagnosis not present

## 2021-12-29 ENCOUNTER — Ambulatory Visit (INDEPENDENT_AMBULATORY_CARE_PROVIDER_SITE_OTHER): Payer: Medicare Other | Admitting: Neurology

## 2021-12-29 ENCOUNTER — Encounter: Payer: Self-pay | Admitting: Neurology

## 2021-12-29 VITALS — BP 129/65 | HR 76 | Ht 72.0 in | Wt 189.4 lb

## 2021-12-29 DIAGNOSIS — R270 Ataxia, unspecified: Secondary | ICD-10-CM

## 2021-12-29 DIAGNOSIS — R269 Unspecified abnormalities of gait and mobility: Secondary | ICD-10-CM | POA: Diagnosis not present

## 2021-12-29 DIAGNOSIS — M48062 Spinal stenosis, lumbar region with neurogenic claudication: Secondary | ICD-10-CM

## 2021-12-29 DIAGNOSIS — R292 Abnormal reflex: Secondary | ICD-10-CM

## 2021-12-29 DIAGNOSIS — R29898 Other symptoms and signs involving the musculoskeletal system: Secondary | ICD-10-CM

## 2021-12-29 DIAGNOSIS — G8929 Other chronic pain: Secondary | ICD-10-CM

## 2021-12-29 DIAGNOSIS — R29818 Other symptoms and signs involving the nervous system: Secondary | ICD-10-CM | POA: Diagnosis not present

## 2021-12-29 DIAGNOSIS — M545 Low back pain, unspecified: Secondary | ICD-10-CM | POA: Diagnosis not present

## 2021-12-29 DIAGNOSIS — W19XXXA Unspecified fall, initial encounter: Secondary | ICD-10-CM

## 2021-12-29 NOTE — Progress Notes (Signed)
[facility]    Provider:  Dr Jaynee Eagles Requesting Provider: Wenda Low, MD Primary Care Provider:  Wenda Low, MD  CC:  low back pain, imbalance, gait abnormality  HPI:  Samuel Little is a 88 y.o. male here as requested by Wenda Low, MD for neuropathy.  He has a past medical history of elevated LFTs, benign positional vertigo, allergic rhinitis, osteoarthritis of the knee, thrombocytopenia, GERD, hypertension, hypothyroidism, mixed hyperlipidemia, erectile dysfunction, varicose veins, chronic kidney disease, prediabetes, paresthesias of both feet, left for lithiasis, cervical disc disease, gout, SVT, macular degeneration of the eyes, neuropathy and constipation.  I reviewed Dr. Sherilyn Cooter notes, he has numbness in the feet also complaining of burning and discomfort during the daytime they tried him on gabapentin, he also appears to be on multiple over-the-counter medications like ginkgo biloba and bilberry, cranberry, elderberry and many more and glucosamine tumeric, medications that are not FDA approved (discussed with patient).  Dr. Deforest Hoyles notes that he complains of lower leg issues in both legs worse in the morning when waking up.  Feet burn and sting from the toes to the knees. Started worsening 1-2 years ago, started worsenig in covid so maybe started 3-4 years ago. Doesn't bother him when sleeping only hurts with his first steps in the morning. He feels like he is walking on a sponge. Only hurts when walking. Feet don;t hurt when he is walking feels unsteady. When he is walking and his legs start to burn he has to sit down. After he gets up he feels better. He uses the grocery cart as a walker and can walk further. He can only walk for a certain amount of time before he has to sit down (claudication). If he stands still his back "gives out" he has chronic back pain and can't stand so long without sitting down and he has had decades since his 20s of back pain. He has  chronic back pain and radicular symptoms from the knees down. Otherwise when sitting or laying no numbness or sensory changes in the feet.   Reviewed notes, labs and imaging from outside physicians, which showed:   I received blood work with the referral: Hemoglobin A1c was 5, TSH 1.67, CMP was unremarkable with BUN 16 and creatinine 0.8 although his liver enzymes were elevated AST 52, ALT 49, ALP 205, T. bili 1.3, uric acid was normal 5.8, CBC showed some mild anemia with elevated MCV, vitamin B12 was greater than 1500 (might he have had a B12 deficiency in the past?).   CT HEAD FINDINGS   Brain: No evidence of acute infarction, hemorrhage, hydrocephalus, extra-axial collection or mass lesion/mass effect.   Patchy areas of white matter hypoattenuation are noted consistent with mild chronic microvascular ischemic change.   Vascular: No hyperdense vessel or unexpected calcification.   Skull: Normal. Negative for fracture or focal lesion.   Sinuses/Orbits: Globes and orbits are unremarkable. Visualized sinuses are clear.   Other: None.   CT CERVICAL SPINE FINDINGS   Alignment: Normal.   Skull base and vertebrae: No acute fracture. No primary bone lesion or focal pathologic process.   Soft tissues and spinal canal: No prevertebral fluid or swelling. No visible canal hematoma.   Disc levels: Moderate loss of disc height at C3-C4, C5-C6 and C6-C7. Bridging anterior osteophytes at C2-C3 and C5 through T1. Mild disc bulging with endplate spurring. No convincing disc herniation. Facet degenerative changes bilaterally.   Upper chest: No acute or significant abnormality.   Other: None.  IMPRESSION: HEAD CT   1. No acute intracranial abnormalities.   CERVICAL CT   1. No fracture or acute finding.   Review of Systems: Patient complains of symptoms per HPI as well as the following symptoms low back pain. Pertinent negatives and positives per HPI. All others  negative.   Social History   Socioeconomic History   Marital status: Married    Spouse name: Not on file   Number of children: Not on file   Years of education: Not on file   Highest education level: Not on file  Occupational History   Not on file  Tobacco Use   Smoking status: Never   Smokeless tobacco: Never  Substance and Sexual Activity   Alcohol use: No   Drug use: No   Sexual activity: Not on file  Other Topics Concern   Not on file  Social History Narrative   Not on file   Social Determinants of Health   Financial Resource Strain: Not on file  Food Insecurity: Not on file  Transportation Needs: Not on file  Physical Activity: Not on file  Stress: Not on file  Social Connections: Not on file  Intimate Partner Violence: Not on file    Family History  Problem Relation Age of Onset   Hypertension Mother    Diabetes Mother    Hypertension Father    Heart attack Father    Diabetes Father    Heart attack Brother    Neuropathy Brother    Heart attack Paternal Uncle    Heart attack Paternal Uncle    Heart attack Paternal Uncle    Other Son        Leak in aorta    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 , march 2,2011    Arthritis    Diverticulosis    Effusion, pericardium    Gastroesophageal reflux disease    Gout    Hard of hearing    wears bilateral hearing aids   History of kidney stones    Hypertension    Hypothyroidism    Macular degeneration    Pleural effusion, left    Right bundle branch block    Thyroid disease    hypothyroidism   Wears glasses     Patient Active Problem List   Diagnosis Date Noted   S/P total knee replacement 02/13/2017   Hyponatremia 06/25/2016   Prolonged QT interval 06/25/2016   Acute cholecystitis 06/24/2016   Diverticulitis of cecum 12/20/2013   BPH (benign prostatic hyperplasia) 12/20/2013    Bacteremia due to Escherichia coli 12/19/2013   Gout 12/18/2013   Sepsis ([place]) 12/18/2013   CKD (chronic kidney disease), stage III ([place]) 12/18/2013   PSVT 04/23/2009   Hypothyroidism 04/22/2009   Essential hypertension 04/22/2009   GERD 04/22/2009   Arthropathy 04/22/2009    Past Surgical History:  Procedure Laterality Date   ARTHROSCOPY KNEE W/ DRILLING Right    CARDIAC CATHETERIZATION  2007   CARDIAC ELECTROPHYSIOLOGY STUDY AND ABLATION     CATARACT EXTRACTION     CHEST TUBE INSERTION     CHOLECYSTECTOMY  06/26/2016   Procedure: LAPAROSCOPIC CHOLECYSTECTOMY  subtotal;  Surgeon: Stark Klein, MD;  Location: [facility];  Service: General;;   FRACTURE SURGERY Right 2001   right leg   HERNIA REPAIR     MASS EXCISION Right 09/22/2015   Procedure: RIGHT THUMB EXCISION MASS;  Surgeon: Lennette Bihari  Fredna Dow, MD;  Location: [facility];  Service: Orthopedics;  Laterality: Right;   ROTATOR CUFF REPAIR     ROTATOR CUFF REPAIR Right 2002   TONSILLECTOMY     TOTAL KNEE ARTHROPLASTY Right 02/13/2017   Procedure: TOTAL KNEE ARTHROPLASTY;  Surgeon: Vickey Huger, MD;  Location: [facility];  Service: Orthopedics;  Laterality: Right;    Current Outpatient Medications  Medication Sig Dispense Refill   acetaminophen (TYLENOL) 650 MG CR tablet Take 1,300 mg every 8 (eight) hours as needed by mouth for pain.      Alpha-Lipoic Acid 100 MG CAPS Take by mouth.     Ascorbic Acid (VITAMIN C) 500 MG CAPS Take 500 mg by mouth daily.      Bilberry 100 MG CAPS Take 100 mg daily by mouth.      bisacodyl (DULCOLAX) 5 MG EC tablet Take 10 mg daily as needed by mouth for moderate constipation.     CALCIUM PO Take by mouth.     cholecalciferol (VITAMIN D) 1000 units tablet Take 1,000 Units daily by mouth.      Coenzyme Q10 (CO Q 10 PO) Take 120 mg daily by mouth.      colchicine 0.6 MG tablet Take 1 tablet (0.6 mg total) by mouth daily as needed (gout). Please take daily for next 5 days until gout flare is resolved,  then as needed (Patient taking differently: Take 0.6 mg by mouth daily as needed (for gout).) 30 tablet 1   CRANBERRY PO Take 500 mg daily by mouth.      Cyanocobalamin (VITAMIN B-12 PO) Take 1 tablet daily by mouth.     doxazosin (CARDURA) 4 MG tablet Take 4 mg by mouth at bedtime.      Flaxseed, Linseed, (FLAX SEED OIL PO) Take by mouth.     GINKGO BILOBA PO Take 120 mg daily by mouth.      Glucos-Chondroit-Hyaluron-MSM (GLUCOSAMINE CHONDROITIN JOINT PO) Take 2 tablets daily by mouth.      levothyroxine (SYNTHROID, LEVOTHROID) 175 MCG tablet Take 175 mcg by mouth daily before breakfast.     lisinopril (PRINIVIL,ZESTRIL) 20 MG tablet Take 1 tablet (20 mg total) by mouth daily. 30 tablet 3   Lutein 6 MG CAPS Take 6 mg daily by mouth.     Omega-3 Fatty Acids (FISH OIL) 1000 MG CAPS Take 1,000 mg daily by mouth.     OVER THE COUNTER MEDICATION Take 2 tablets 2 (two) times daily by mouth. Macular Protect Complete Supplement     polyethylene glycol (MIRALAX / GLYCOLAX) packet Take 17 g daily as needed by mouth for moderate constipation.     RESVERATROL PO Take 1 tablet by mouth daily.      senna (SENOKOT) 8.6 MG TABS tablet Take 1 tablet (8.6 mg total) by mouth at bedtime as needed for mild constipation. (Patient taking differently: Take 1 tablet by mouth as needed for mild constipation.) 10 each 0   TART CHERRY PO Take by mouth.     Trolamine Salicylate (ASPERCREME EX) Apply 1 application as needed topically (for muscle pain).     Turmeric 500 MG CAPS Take 500 mg 2 (two) times daily by mouth.      No current facility-administered medications for this visit.    Allergies as of 12/29/2021 - Review Complete 12/29/2021  Allergen Reaction Noted   Nsaids Other (See Comments) 12/18/2013   Celecoxib Nausea And Vomiting    Meperidine hcl Nausea Only     Vitals: BP  129/65   Pulse 76   Ht 6' (1.829 m)   Wt 189 lb 6.4 oz (85.9 kg)   BMI 25.69 kg/m  Last Weight:  Wt Readings from Last 1  Encounters:  12/29/21 189 lb 6.4 oz (85.9 kg)   Last Height:   Ht Readings from Last 1 Encounters:  12/29/21 6' (1.829 m)     Physical exam: Exam: Gen: NAD, conversant, well nourised, well groomed                     CV: RRR, no MRG. No Carotid Bruits. minimal peripheral edema, warm, nontender, rubor distally around the ankles Eyes: Conjunctivae clear without exudates or hemorrhage  Neuro: Detailed Neurologic Exam  Speech:    Speech is normal; fluent and spontaneous with normal comprehension.  Cognition:    The patient is oriented to person, place, and time;     recent and remote memory impaired;     language fluent;     normal attention, concentration,     fund of knowledge impaired Cranial Nerves:    The pupils are equal, round, and reactive to light. Attempted, pupils too small to visualize fundi. Visual fields are full to finger confrontation. Extraocular movements are intact. Trigeminal sensation is intact and the muscles of mastication are normal. The face is symmetric. The palate elevates in the midline. Hearing intact. Voice is normal. Shoulder shrug is normal. The tongue has normal motion without fasciculations.   Coordination:    Ataxic when walking   Gait:    Stooped, antalgic due to back pain  Motor Observation:    No asymmetry, no atrophy, and no involuntary movements noted. Tone:    Normal muscle tone.    Posture:    Posture is normal. normal erect    Strength: proximal LE weakness otherwise strength is intact in the upper and lower limbs.      Sensation: intact to LT, pin prick cold, vibration and proprioception     Reflex Exam:  DTR's: absent Ajs otherwise othewise deep tendon reflexes in the upper and lower extremities are slightly brisk for age bilaterally.   Toes:    The toes are eqiv bilaterally.   Clonus:    Clonus is absent.    Assessment/Plan:  88 y.o. male here as requested by Wenda Low, MD. Patient has chronic back pain, legs hurt  and burn only when walking or standing, better with leaning on a cart, has to sit down and the burning goes away and can walk again. The burning doesn't bother him at night or unless he is walking. This sounds more like lumbar spinal stenosis with claudication and not a distal peripheral polyneuropathy. His sensory exam is relatively normal for age.  If he stands still his back "gives out" he has chronic back pain and can't stand so long without sitting down and he has had decades since his 20s of back pain and has been told of degenerative low back changes in the past. He has chronic back pain and radicular symptoms from the knees down. Otherwise when sitting or laying no numbness or sensory changes in the feet.   MRI of the lumbar spine due to falls, imbalance, claudication, brisk reflexes, chronic low back pain for decades, bladder changes, gait abnormality - needs MRI for spinal stenosis  Orders Placed This Encounter  Procedures   MR LUMBAR SPINE WO CONTRAST   Cc: Wenda Low, MD,  Wenda Low, MD  Sarina Ill, MD  [facility] [address] [place], [place] [address]  Phone [phone] Fax [phone]

## 2021-12-29 NOTE — Patient Instructions (Addendum)
Spinal Stenosis   Spinal stenosis is a condition that happens when the spinal canal narrows. The spinal canal is the space between the bones of your spine (vertebrae). This narrowing puts pressure on the spinal cord and nerves that exit the spine and run down the arms or legs. When nerves exiting the spine are pinched, it can cause pain, numbness, or weakness in the arms or legs. Spinal stenosis can affect the vertebrae in the neck, upper back, and lower back. Spinal stenosis can range from mild to severe. What are the causes? This condition is caused by areas of bone pushing into the spinal canal. This condition may be present at birth (congenital), or it may be caused by: Slow breakdown of your vertebrae (spinal degeneration). This usually starts between 14 and 80 years of age. Injury (trauma) to your spine. Previous spinal surgery. Tumors in your spine. Calcium deposits in your spine. What increases the risk? The following factors may make you more likely to develop this condition: Being older than age 21. Being born with an abnormally shaped spine (congenitalspinal deformity), such as scoliosis. Having arthritis. What are the signs or symptoms? Symptoms of this condition include: Pain in the neck or back that is generally worse with activities, particularly when standing or walking. Numbness, tingling, hot or cold sensations, weakness, or tiredness (fatigue) in your arms or legs. This can happen in one arm or leg, or both. Can only walk so far without feeling pain and having to sit down Falling, having to use a shopping cart or walker. Can have weakness In more severe cases, you may develop: Problems having a bowel movement or urinating. Difficulty having sex. Loss of feeling in your legs and inability to walk. Symptoms may come on slowly and get worse over time. In some cases, there are no symptoms. How is this diagnosed? This condition is diagnosed based on your medical history  and a physical exam. You may also have tests, such as an X-ray, CT scan, or MRI. How is this treated? Treatment for this condition often focuses on managing your pain and any other symptoms. Treatment may include: Practicing good posture to lessen pressure on your nerves. Exercises to strengthen muscles, build endurance, improve balance, and maintain range of motion. This may include physical therapy to restore movement and strength to your back. Losing weight, if needed. Medicines to reduce inflammation or pain. This may include a medicine that is injected into your spine (steroidinjection). Assistive devices, such as a corset or brace. In some cases, surgery may be needed. The most common procedure is decompression laminectomy. This removes excess bone that puts pressure on your nerve roots. Follow these instructions at home: Managing pain, stiffness, and swelling  Practice good posture. If you were given a brace or a corset, wear it as told by your health care provider. Maintain a healthy weight. Talk with your health care provider if you need help losing weight. If directed, apply heat to the affected area as often as told by your health care provider. Use the heat source that your health care provider recommends, such as a moist heat pack or a heating pad. Place a towel between your skin and the heat source. Leave the heat on for 20-30 minutes. If your skin turns bright red, remove the heat right away to prevent burns. The risk of burns is higher if you cannot feel pain, heat, or cold. Activity Do all exercises and stretches as told by your health care provider. Do  not do any activities that cause pain. You may have to avoid lifting. Ask your health care provider how much you can safely lift. Return to your normal activities as told by your health care provider. Ask your health care provider what activities are safe for you. General instructions Take over-the-counter and prescription  medicines only as told by your health care provider. Do not use any products that contain nicotine or tobacco. These products include cigarettes, chewing tobacco, and vaping devices, such as e-cigarettes. If you need help quitting, ask your health care provider. Eat a healthy diet. This includes plenty of fruits and vegetables, whole grains, and low-fat (lean) protein. Where to find more information [employer] of Arthritis and Musculoskeletal and Skin Diseases: www.niams.SouthEx[employer].es Contact a health care provider if: Your symptoms do not get better or they get worse. You have a fever. Get help right away if: You have new pain or symptoms of severe pain, such as: New or worsening pain in your neck or upper back. Severe pain that cannot be controlled with medicines. A severe headache that gets worse when you stand. You are dizzy. You have vision problems, such as blurred vision or double vision. You have nausea or vomiting. You develop new or worsening numbness or tingling in your back or legs. You lose control of your bowels or bladder. You have pain, redness, swelling, or warmth in your arm or leg. These symptoms may be an emergency. Get help right away. Call 911. Do not wait to see if the symptoms will go away. Do not drive yourself to the hospital. Summary Spinal stenosis is a condition that happens when the spinal canal narrows, putting pressure on the spinal cord or nerves that exit the vertebrae. This condition is caused by areas of bone pushing into the spinal canal. Spinal stenosis can cause numbness, weakness, or pain in the buttocks, neck, back, arms, and legs. This condition is usually diagnosed with your medical history, a physical exam, and tests, such as an X-ray, CT scan, or MRI. This information is not intended to replace advice given to you by your health care provider. Make sure you discuss any questions you have with your health care provider. Document Revised:  05/25/2021 Document Reviewed: 05/25/2021 Elsevier Patient Education  [place].

## 2022-01-03 ENCOUNTER — Telehealth: Payer: Self-pay | Admitting: Neurology

## 2022-01-03 NOTE — Telephone Encounter (Signed)
medicare NPR GEHA NPR ref: Alisia Ferrari. 01/03/22 sent to GI [phone]

## 2022-01-18 ENCOUNTER — Ambulatory Visit
Admission: RE | Admit: 2022-01-18 | Discharge: 2022-01-18 | Disposition: A | Discharge status: Home / Self Care | Payer: Medicare Other | Source: Ambulatory Visit | Attending: Neurology | Admitting: Neurology

## 2022-01-18 DIAGNOSIS — R29818 Other symptoms and signs involving the nervous system: Secondary | ICD-10-CM

## 2022-01-18 DIAGNOSIS — G8929 Other chronic pain: Secondary | ICD-10-CM

## 2022-01-18 DIAGNOSIS — R269 Unspecified abnormalities of gait and mobility: Secondary | ICD-10-CM

## 2022-01-18 DIAGNOSIS — R29898 Other symptoms and signs involving the musculoskeletal system: Secondary | ICD-10-CM

## 2022-01-18 DIAGNOSIS — M545 Low back pain, unspecified: Secondary | ICD-10-CM | POA: Diagnosis not present

## 2022-01-18 DIAGNOSIS — R292 Abnormal reflex: Secondary | ICD-10-CM

## 2022-01-18 DIAGNOSIS — M48062 Spinal stenosis, lumbar region with neurogenic claudication: Secondary | ICD-10-CM | POA: Diagnosis not present

## 2022-01-18 DIAGNOSIS — R270 Ataxia, unspecified: Secondary | ICD-10-CM | POA: Diagnosis not present

## 2022-01-18 DIAGNOSIS — W19XXXA Unspecified fall, initial encounter: Secondary | ICD-10-CM

## 2022-01-24 ENCOUNTER — Ambulatory Visit (HOSPITAL_BASED_OUTPATIENT_CLINIC_OR_DEPARTMENT_OTHER)
Admission: RE | Admit: 2022-01-24 | Discharge: 2022-01-24 | Disposition: A | Discharge status: Home / Self Care | Payer: Medicare Other | Source: Ambulatory Visit | Attending: Internal Medicine | Admitting: Internal Medicine

## 2022-01-24 ENCOUNTER — Other Ambulatory Visit (HOSPITAL_COMMUNITY): Payer: Self-pay | Admitting: Internal Medicine

## 2022-01-24 ENCOUNTER — Telehealth: Payer: Self-pay

## 2022-01-24 DIAGNOSIS — M545 Low back pain, unspecified: Secondary | ICD-10-CM

## 2022-01-24 DIAGNOSIS — M7989 Other specified soft tissue disorders: Secondary | ICD-10-CM | POA: Diagnosis not present

## 2022-01-24 DIAGNOSIS — R269 Unspecified abnormalities of gait and mobility: Secondary | ICD-10-CM

## 2022-01-24 DIAGNOSIS — M25561 Pain in right knee: Secondary | ICD-10-CM | POA: Diagnosis not present

## 2022-01-24 DIAGNOSIS — T148XXA Other injury of unspecified body region, initial encounter: Secondary | ICD-10-CM

## 2022-01-24 DIAGNOSIS — S8011XA Contusion of right lower leg, initial encounter: Secondary | ICD-10-CM | POA: Diagnosis not present

## 2022-01-24 DIAGNOSIS — M48062 Spinal stenosis, lumbar region with neurogenic claudication: Secondary | ICD-10-CM

## 2022-01-24 DIAGNOSIS — L0390 Cellulitis, unspecified: Secondary | ICD-10-CM | POA: Diagnosis not present

## 2022-01-24 DIAGNOSIS — R29898 Other symptoms and signs involving the musculoskeletal system: Secondary | ICD-10-CM

## 2022-01-24 DIAGNOSIS — G8929 Other chronic pain: Secondary | ICD-10-CM

## 2022-01-24 NOTE — Telephone Encounter (Signed)
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 I would like to send you to neurosurgery to take a look. If agreed, you can send him to dr Arnoldo Morale are [place] [facility] thanks

## 2022-01-24 NOTE — Telephone Encounter (Signed)
I called patient to discuss.  No answer, left a voicemail asking him to call us back.  If patient calls back another day, please route to POD 4.

## 2022-01-25 ENCOUNTER — Telehealth: Payer: Self-pay | Admitting: Neurology

## 2022-01-25 NOTE — Telephone Encounter (Signed)
Referral sent to Dr. Arnoldo Morale at [facility], phone # [phone].

## 2022-01-25 NOTE — Telephone Encounter (Signed)
Patient and his wife returned my call.  I discussed his MRI lumbar spine results.  Patient is agreeable to a referral to [facility].  Patient understands that that office will call him in the next week or 2.  Patient will let us know if he does not hear anything from that office.  Patient verbalized understanding of results and recommendations.  Patient does not have any questions or concerns at this time.

## 2022-01-31 DIAGNOSIS — D649 Anemia, unspecified: Secondary | ICD-10-CM | POA: Diagnosis not present

## 2022-01-31 DIAGNOSIS — R17 Unspecified jaundice: Secondary | ICD-10-CM | POA: Diagnosis not present

## 2022-01-31 DIAGNOSIS — L03115 Cellulitis of right lower limb: Secondary | ICD-10-CM | POA: Diagnosis not present

## 2022-02-07 DIAGNOSIS — L03115 Cellulitis of right lower limb: Secondary | ICD-10-CM | POA: Diagnosis not present

## 2022-02-07 DIAGNOSIS — L309 Dermatitis, unspecified: Secondary | ICD-10-CM | POA: Diagnosis not present

## 2022-02-08 NOTE — Telephone Encounter (Signed)
Received fax that pt is scheduled with Dr. Arnoldo Morale for 02/18/22 at 3:30 pm.

## 2022-02-11 DIAGNOSIS — H906 Mixed conductive and sensorineural hearing loss, bilateral: Secondary | ICD-10-CM | POA: Diagnosis not present

## 2022-02-14 DIAGNOSIS — R7989 Other specified abnormal findings of blood chemistry: Secondary | ICD-10-CM | POA: Diagnosis not present

## 2022-02-14 DIAGNOSIS — L03115 Cellulitis of right lower limb: Secondary | ICD-10-CM | POA: Diagnosis not present

## 2022-02-17 DIAGNOSIS — H35033 Hypertensive retinopathy, bilateral: Secondary | ICD-10-CM | POA: Diagnosis not present

## 2022-02-17 DIAGNOSIS — H43813 Vitreous degeneration, bilateral: Secondary | ICD-10-CM | POA: Diagnosis not present

## 2022-02-17 DIAGNOSIS — H353231 Exudative age-related macular degeneration, bilateral, with active choroidal neovascularization: Secondary | ICD-10-CM | POA: Diagnosis not present

## 2022-02-18 DIAGNOSIS — Z6825 Body mass index (BMI) 25.0-25.9, adult: Secondary | ICD-10-CM | POA: Diagnosis not present

## 2022-02-18 DIAGNOSIS — M48061 Spinal stenosis, lumbar region without neurogenic claudication: Secondary | ICD-10-CM | POA: Diagnosis not present

## 2022-02-21 DIAGNOSIS — H938X3 Other specified disorders of ear, bilateral: Secondary | ICD-10-CM | POA: Diagnosis not present

## 2022-09-09 ENCOUNTER — Inpatient Hospital Stay (HOSPITAL_COMMUNITY): Payer: Medicare Other

## 2022-09-09 ENCOUNTER — Other Ambulatory Visit: Payer: Self-pay

## 2022-09-09 ENCOUNTER — Encounter (HOSPITAL_COMMUNITY): Payer: Self-pay

## 2022-09-09 ENCOUNTER — Inpatient Hospital Stay (HOSPITAL_COMMUNITY)
Admission: EM | Admit: 2022-09-09 | Discharge: 2022-09-11 | DRG: 194 | Disposition: A | Discharge status: Home / Self Care | Payer: Medicare Other | Attending: Internal Medicine | Admitting: Internal Medicine

## 2022-09-09 ENCOUNTER — Emergency Department (HOSPITAL_COMMUNITY): Payer: Medicare Other

## 2022-09-09 DIAGNOSIS — Z888 Allergy status to other drugs, medicaments and biological substances status: Secondary | ICD-10-CM

## 2022-09-09 DIAGNOSIS — R0781 Pleurodynia: Secondary | ICD-10-CM | POA: Diagnosis present

## 2022-09-09 DIAGNOSIS — M109 Gout, unspecified: Secondary | ICD-10-CM | POA: Diagnosis present

## 2022-09-09 DIAGNOSIS — I3131 Malignant pericardial effusion in diseases classified elsewhere: Secondary | ICD-10-CM | POA: Diagnosis not present

## 2022-09-09 DIAGNOSIS — I080 Rheumatic disorders of both mitral and aortic valves: Secondary | ICD-10-CM | POA: Diagnosis not present

## 2022-09-09 DIAGNOSIS — Z833 Family history of diabetes mellitus: Secondary | ICD-10-CM | POA: Diagnosis not present

## 2022-09-09 DIAGNOSIS — Z96651 Presence of right artificial knee joint: Secondary | ICD-10-CM | POA: Diagnosis present

## 2022-09-09 DIAGNOSIS — Z974 Presence of external hearing-aid: Secondary | ICD-10-CM

## 2022-09-09 DIAGNOSIS — Z1152 Encounter for screening for COVID-19: Secondary | ICD-10-CM

## 2022-09-09 DIAGNOSIS — Z886 Allergy status to analgesic agent status: Secondary | ICD-10-CM | POA: Diagnosis not present

## 2022-09-09 DIAGNOSIS — I517 Cardiomegaly: Secondary | ICD-10-CM | POA: Diagnosis not present

## 2022-09-09 DIAGNOSIS — Z79899 Other long term (current) drug therapy: Secondary | ICD-10-CM

## 2022-09-09 DIAGNOSIS — D649 Anemia, unspecified: Secondary | ICD-10-CM | POA: Diagnosis present

## 2022-09-09 DIAGNOSIS — E039 Hypothyroidism, unspecified: Secondary | ICD-10-CM | POA: Diagnosis present

## 2022-09-09 DIAGNOSIS — R0902 Hypoxemia: Secondary | ICD-10-CM | POA: Diagnosis present

## 2022-09-09 DIAGNOSIS — I10 Essential (primary) hypertension: Secondary | ICD-10-CM | POA: Diagnosis present

## 2022-09-09 DIAGNOSIS — J918 Pleural effusion in other conditions classified elsewhere: Secondary | ICD-10-CM | POA: Diagnosis present

## 2022-09-09 DIAGNOSIS — I5030 Unspecified diastolic (congestive) heart failure: Secondary | ICD-10-CM | POA: Diagnosis not present

## 2022-09-09 DIAGNOSIS — Z79890 Hormone replacement therapy (postmenopausal): Secondary | ICD-10-CM | POA: Diagnosis not present

## 2022-09-09 DIAGNOSIS — I3139 Other pericardial effusion (noninflammatory): Secondary | ICD-10-CM | POA: Diagnosis not present

## 2022-09-09 DIAGNOSIS — Z8249 Family history of ischemic heart disease and other diseases of the circulatory system: Secondary | ICD-10-CM

## 2022-09-09 DIAGNOSIS — R079 Chest pain, unspecified: Principal | ICD-10-CM

## 2022-09-09 DIAGNOSIS — I77810 Thoracic aortic ectasia: Secondary | ICD-10-CM | POA: Diagnosis not present

## 2022-09-09 DIAGNOSIS — J189 Pneumonia, unspecified organism: Principal | ICD-10-CM | POA: Diagnosis present

## 2022-09-09 HISTORY — PX: IR THORACENTESIS ASP PLEURAL SPACE W/IMG GUIDE: IMG5380

## 2022-09-09 LAB — CBC WITH DIFFERENTIAL/PLATELET
Abs Immature Granulocytes: 0.04 10*3/uL (ref 0.00–0.07)
Basophils Absolute: 0 10*3/uL (ref 0.0–0.1)
Basophils Relative: 0 %
Eosinophils Absolute: 0 10*3/uL (ref 0.0–0.5)
Eosinophils Relative: 1 %
HCT: 32.7 % — ABNORMAL LOW (ref 39.0–52.0)
Hemoglobin: 10.8 g/dL — ABNORMAL LOW (ref 13.0–17.0)
Immature Granulocytes: 1 %
Lymphocytes Relative: 16 %
Lymphs Abs: 0.6 10*3/uL — ABNORMAL LOW (ref 0.7–4.0)
MCH: 32.3 pg (ref 26.0–34.0)
MCHC: 33 g/dL (ref 30.0–36.0)
MCV: 97.9 fL (ref 80.0–100.0)
Monocytes Absolute: 0.4 10*3/uL (ref 0.1–1.0)
Monocytes Relative: 10 %
Neutro Abs: 2.8 10*3/uL (ref 1.7–7.7)
Neutrophils Relative %: 72 %
Platelets: 172 10*3/uL (ref 150–400)
RBC: 3.34 MIL/uL — ABNORMAL LOW (ref 4.22–5.81)
RDW: 15.8 % — ABNORMAL HIGH (ref 11.5–15.5)
WBC: 3.9 10*3/uL — ABNORMAL LOW (ref 4.0–10.5)
nRBC: 0 % (ref 0.0–0.2)

## 2022-09-09 LAB — ALBUMIN, PLEURAL OR PERITONEAL FLUID: Albumin, Fluid: 1.9 g/dL

## 2022-09-09 LAB — TROPONIN I (HIGH SENSITIVITY)
Troponin I (High Sensitivity): 10 ng/L (ref <18)
Troponin I (High Sensitivity): 12 ng/L (ref <18)

## 2022-09-09 LAB — COMPREHENSIVE METABOLIC PANEL
ALT: 69 U/L — ABNORMAL HIGH (ref 0–44)
AST: 72 U/L — ABNORMAL HIGH (ref 15–41)
Albumin: 2.6 g/dL — ABNORMAL LOW (ref 3.5–5.0)
Alkaline Phosphatase: 339 U/L — ABNORMAL HIGH (ref 38–126)
Anion gap: 13 (ref 5–15)
BUN: 11 mg/dL (ref 8–23)
CO2: 20 mmol/L — ABNORMAL LOW (ref 22–32)
Calcium: 8.7 mg/dL — ABNORMAL LOW (ref 8.9–10.3)
Chloride: 104 mmol/L (ref 98–111)
Creatinine, Ser: 0.74 mg/dL (ref 0.61–1.24)
GFR, Estimated: >60 mL/min (ref >60)
Glucose, Bld: 96 mg/dL (ref 70–99)
Potassium: 3.7 mmol/L (ref 3.5–5.1)
Sodium: 137 mmol/L (ref 135–145)
Total Bilirubin: 0.8 mg/dL (ref 0.3–1.2)
Total Protein: 6.2 g/dL — ABNORMAL LOW (ref 6.5–8.1)

## 2022-09-09 LAB — BODY FLUID CULTURE W GRAM STAIN

## 2022-09-09 LAB — PROTIME-INR
INR: 1.3 — ABNORMAL HIGH (ref 0.8–1.2)
Prothrombin Time: 16.8 seconds — ABNORMAL HIGH (ref 11.4–15.2)

## 2022-09-09 LAB — BODY FLUID CELL COUNT WITH DIFFERENTIAL
Eos, Fluid: 0 %
Lymphs, Fluid: 5 %
Monocyte-Macrophage-Serous Fluid: 9 % — ABNORMAL LOW (ref 50–90)
Neutrophil Count, Fluid: 85 % — ABNORMAL HIGH (ref 0–25)
Total Nucleated Cell Count, Fluid: UNDETERMINED cu mm (ref 0–1000)

## 2022-09-09 LAB — LACTATE DEHYDROGENASE, PLEURAL OR PERITONEAL FLUID: LD, Fluid: 453 U/L — ABNORMAL HIGH (ref 3–23)

## 2022-09-09 LAB — TSH: TSH: 1.348 u[IU]/mL (ref 0.350–4.500)

## 2022-09-09 LAB — LACTIC ACID, PLASMA: Lactic Acid, Venous: 1.5 mmol/L (ref 0.5–1.9)

## 2022-09-09 LAB — GLUCOSE, PLEURAL OR PERITONEAL FLUID: Glucose, Fluid: <20 mg/dL

## 2022-09-09 LAB — GLUCOSE, CAPILLARY: Glucose-Capillary: 274 mg/dL — ABNORMAL HIGH (ref 70–99)

## 2022-09-09 LAB — PROTEIN, PLEURAL OR PERITONEAL FLUID: Total protein, fluid: 3.7 g/dL

## 2022-09-09 LAB — LIPASE, BLOOD: Lipase: 50 U/L (ref 11–51)

## 2022-09-09 LAB — BRAIN NATRIURETIC PEPTIDE: B Natriuretic Peptide: 221 pg/mL — ABNORMAL HIGH (ref 0.0–100.0)

## 2022-09-09 LAB — SARS CORONAVIRUS 2 BY RT PCR: SARS Coronavirus 2 by RT PCR: NEGATIVE

## 2022-09-09 MED ORDER — COLCHICINE 0.6 MG PO TABS: 0.6000 mg | ORAL_TABLET | Freq: Every day | ORAL | Status: DC

## 2022-09-09 MED ORDER — LEVOTHYROXINE SODIUM 75 MCG PO TABS
175.0000 ug | ORAL_TABLET | Freq: Every day | ORAL | Status: DC
  Administered 2022-09-10 – 2022-09-11 (×2): 175 ug via ORAL
  Filled 2022-09-09 (×3): qty 1

## 2022-09-09 MED ORDER — SODIUM CHLORIDE 0.9 % IV SOLN
1.0000 g | Freq: Once | INTRAVENOUS | Status: AC
  Administered 2022-09-09: 1 g via INTRAVENOUS
  Filled 2022-09-09: qty 10

## 2022-09-09 MED ORDER — LIDOCAINE HCL 1 % IJ SOLN
INTRAMUSCULAR | Status: AC
  Filled 2022-09-09: qty 20

## 2022-09-09 MED ORDER — ACETAMINOPHEN 500 MG PO TABS
1000.0000 mg | ORAL_TABLET | Freq: Once | ORAL | Status: AC
  Administered 2022-09-09: 1000 mg via ORAL
  Filled 2022-09-09: qty 2

## 2022-09-09 MED ORDER — BISACODYL 5 MG PO TBEC: 10.0000 mg | DELAYED_RELEASE_TABLET | Freq: Every day | ORAL | Status: DC | PRN

## 2022-09-09 MED ORDER — HYDRALAZINE HCL 20 MG/ML IJ SOLN: 5.0000 mg | Freq: Four times a day (QID) | INTRAMUSCULAR | Status: DC | PRN

## 2022-09-09 MED ORDER — AZITHROMYCIN 500 MG PO TABS
500.0000 mg | ORAL_TABLET | Freq: Every day | ORAL | Status: DC
  Administered 2022-09-10 – 2022-09-11 (×2): 500 mg via ORAL
  Filled 2022-09-09 (×2): qty 1

## 2022-09-09 MED ORDER — SODIUM CHLORIDE 0.9 % IV SOLN
500.0000 mg | Freq: Once | INTRAVENOUS | Status: DC
  Filled 2022-09-09: qty 5

## 2022-09-09 MED ORDER — POLYETHYLENE GLYCOL 3350 17 G PO PACK: 17.0000 g | PACK | Freq: Every day | ORAL | Status: DC | PRN

## 2022-09-09 MED ORDER — DEXTROSE 50 % IV SOLN
INTRAVENOUS | Status: AC
  Administered 2022-09-09: 50 mL
  Filled 2022-09-09: qty 50

## 2022-09-09 MED ORDER — HYDROMORPHONE HCL 1 MG/ML IJ SOLN
0.5000 mg | INTRAMUSCULAR | Status: DC | PRN
  Administered 2022-09-09 – 2022-09-10 (×2): 0.5 mg via INTRAVENOUS
  Filled 2022-09-09: qty 1
  Filled 2022-09-09: qty 0.5

## 2022-09-09 MED ORDER — COLCHICINE 0.6 MG PO TABS: 0.6000 mg | ORAL_TABLET | Freq: Every day | ORAL | Status: DC | PRN

## 2022-09-09 MED ORDER — IPRATROPIUM-ALBUTEROL 0.5-2.5 (3) MG/3ML IN SOLN
3.0000 mL | Freq: Four times a day (QID) | RESPIRATORY_TRACT | Status: DC
  Administered 2022-09-09 – 2022-09-10 (×2): 3 mL via RESPIRATORY_TRACT
  Filled 2022-09-09 (×2): qty 3

## 2022-09-09 MED ORDER — PROMETHAZINE HCL 12.5 MG PO TABS: 12.5000 mg | ORAL_TABLET | Freq: Four times a day (QID) | ORAL | Status: DC | PRN

## 2022-09-09 MED ORDER — LACTATED RINGERS IV BOLUS
1000.0000 mL | Freq: Once | INTRAVENOUS | Status: AC
  Administered 2022-09-09: 1000 mL via INTRAVENOUS

## 2022-09-09 MED ORDER — SENNA 8.6 MG PO TABS: 1.0000 | ORAL_TABLET | ORAL | Status: DC | PRN

## 2022-09-09 MED ORDER — ENOXAPARIN SODIUM 40 MG/0.4ML IJ SOSY
40.0000 mg | PREFILLED_SYRINGE | INTRAMUSCULAR | Status: DC
  Administered 2022-09-09 – 2022-09-10 (×2): 40 mg via SUBCUTANEOUS
  Filled 2022-09-09 (×2): qty 0.4

## 2022-09-09 MED ORDER — GUAIFENESIN ER 600 MG PO TB12
1200.0000 mg | ORAL_TABLET | Freq: Two times a day (BID) | ORAL | Status: DC
  Administered 2022-09-09 – 2022-09-10 (×3): 1200 mg via ORAL
  Filled 2022-09-09 (×3): qty 2

## 2022-09-09 MED ORDER — PREDNISONE 20 MG PO TABS
20.0000 mg | ORAL_TABLET | Freq: Every day | ORAL | Status: DC
  Administered 2022-09-09 – 2022-09-11 (×3): 20 mg via ORAL
  Filled 2022-09-09 (×3): qty 1

## 2022-09-09 MED ORDER — SODIUM CHLORIDE 0.9 % IV SOLN
2.0000 g | INTRAVENOUS | Status: DC
  Administered 2022-09-10 – 2022-09-11 (×2): 2 g via INTRAVENOUS
  Filled 2022-09-09 (×2): qty 20

## 2022-09-09 MED ORDER — DOXAZOSIN MESYLATE 4 MG PO TABS
4.0000 mg | ORAL_TABLET | Freq: Every day | ORAL | Status: DC
  Administered 2022-09-09 – 2022-09-10 (×2): 4 mg via ORAL
  Filled 2022-09-09 (×3): qty 1

## 2022-09-09 MED ORDER — LIDOCAINE HCL 1 % IJ SOLN
20.0000 mL | Freq: Once | INTRAMUSCULAR | Status: AC
  Administered 2022-09-09: 7 mL via INTRADERMAL
  Filled 2022-09-09: qty 20

## 2022-09-09 MED ORDER — LISINOPRIL 20 MG PO TABS
20.0000 mg | ORAL_TABLET | Freq: Every day | ORAL | Status: DC
  Administered 2022-09-09 – 2022-09-11 (×3): 20 mg via ORAL
  Filled 2022-09-09 (×4): qty 1

## 2022-09-09 MED ORDER — ACETAMINOPHEN 500 MG PO TABS
1000.0000 mg | ORAL_TABLET | Freq: Three times a day (TID) | ORAL | Status: DC | PRN
  Administered 2022-09-09: 1000 mg via ORAL
  Filled 2022-09-09: qty 2

## 2022-09-09 NOTE — Plan of Care (Signed)
  Problem: Activity: Goal: Ability to tolerate increased activity will improve Outcome: Progressing   

## 2022-09-09 NOTE — ED Triage Notes (Signed)
PT BIB EMS  for chest pain, left side, was lifting and moving [place] the beach, pulled some muscles, and has been on prednisone, but the pain was worse this morning.  176/76 95% RA 147 CBG 100.3 Temp

## 2022-09-09 NOTE — ED Notes (Signed)
ED TO INPATIENT HANDOFF REPORT  ED Nurse Name and Phone #: Sachin Ferencz, RN [phone]  S Name/Age/Gender Samuel Little 89 y.o. male Room/Bed: 006C/006C  Code Status   Code Status: Full Code  Home/SNF/Other Home Patient oriented to: self, place, time, and situation Is this baseline? Yes   Triage Complete: Triage complete  Chief Complaint PNA (pneumonia) [J18.9]  Triage Note PT BIB EMS  for chest pain, left side, was lifting and moving things for his son at the beach, pulled some muscles, and has been on prednisone, but the pain was worse this morning.  176/76 95% RA 147 CBG 100.3 Temp   Allergies Allergies  Allergen Reactions   Nsaids Other (See Comments)    JAUNDICE   Celecoxib Nausea And Vomiting   Meperidine Hcl Nausea Only    Level of Care/Admitting Diagnosis ED Disposition     ED Disposition  Admit   Condition  --   Comment  Hospital Area: [facility] [[id number]]  Level of Care: Telemetry Medical [104]  May admit patient to [facility] or [facility] if equivalent level of care is available:: No  Covid Evaluation: Confirmed COVID Negative  Diagnosis: PNA (pneumonia) [474259]  Admitting Physician: Emeline General [[id number]]  Attending Physician: Emeline General [[id number]]  Certification:: I certify this patient will need inpatient services for at least 2 midnights  Estimated Length of Stay: 2          B Medical/Surgery History 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 , march 2,2011    Arthritis    Diverticulosis    Effusion, pericardium    Gastroesophageal reflux disease    Gout    Hard of hearing    wears bilateral hearing aids   History of kidney stones    Hypertension    Hypothyroidism    Macular degeneration    Pleural effusion, left    Right bundle branch block    Thyroid disease    hypothyroidism   Wears glasses    Past  Surgical History:  Procedure Laterality Date   ARTHROSCOPY KNEE W/ DRILLING Right    CARDIAC CATHETERIZATION  2007   CARDIAC ELECTROPHYSIOLOGY STUDY AND ABLATION     CATARACT EXTRACTION     CHEST TUBE INSERTION     CHOLECYSTECTOMY  06/26/2016   Procedure: LAPAROSCOPIC CHOLECYSTECTOMY  subtotal;  Surgeon: Almond Lint, MD;  Location: [facility];  Service: General;;   FRACTURE SURGERY Right 2001   right leg   HERNIA REPAIR     MASS EXCISION Right 09/22/2015   Procedure: RIGHT THUMB EXCISION MASS;  Surgeon: Betha Loa, MD;  Location: [facility];  Service: Orthopedics;  Laterality: Right;   ROTATOR CUFF REPAIR     ROTATOR CUFF REPAIR Right 2002   TONSILLECTOMY     TOTAL KNEE ARTHROPLASTY Right 02/13/2017   Procedure: TOTAL KNEE ARTHROPLASTY;  Surgeon: Dannielle Huh, MD;  Location: [facility] OR;  Service: Orthopedics;  Laterality: Right;     A IV Location/Drains/Wounds Patient Lines/Drains/Airways Status     Active Line/Drains/Airways     Name Placement date Placement time Site Days   Peripheral IV 09/09/22 20 G 1.25" Anterior;Right Forearm 09/09/22  0855  Forearm  less than 1   Closed System Drain 1 Right;Lateral Abdomen Bulb (JP) 19 Fr. 06/26/16  1121  Abdomen  2266   Incision (Closed) 09/22/15 Finger (Comment which one) Right 09/22/15  1345  -- 2544   Incision (Closed) 06/26/16 Abdomen Other (Comment) 06/26/16  1119  -- 2266   Incision (Closed) 02/13/17 Leg Right 02/13/17  1031  -- 2034   Incision - 4 Ports Abdomen Umbilicus Upper;Medial Right;Medial Right;Lateral 06/26/16  1044  -- 2266            Intake/Output Last 24 hours No intake or output data in the 24 hours ending 09/09/22 1150  Labs/Imaging Results for orders placed or performed during the hospital encounter of 09/09/22 (from the past 48 hour(s))  Brain natriuretic peptide     Status: Abnormal   Collection Time: 09/09/22  8:30 AM  Result Value Ref Range   B Natriuretic Peptide 221.0 (H) 0.0 - 100.0 pg/mL     Comment: Performed at [facility], [address], [place], [place] [address]  CBC with Differential     Status: Abnormal   Collection Time: 09/09/22  8:42 AM  Result Value Ref Range   WBC 3.9 (L) 4.0 - 10.5 K/uL   RBC 3.34 (L) 4.22 - 5.81 MIL/uL   Hemoglobin 10.8 (L) 13.0 - 17.0 g/dL   HCT 60.4 (L) 54.0 - 98.1 %   MCV 97.9 80.0 - 100.0 fL   MCH 32.3 26.0 - 34.0 pg   MCHC 33.0 30.0 - 36.0 g/dL   RDW 19.1 (H) 47.8 - 29.5 %   Platelets 172 150 - 400 K/uL   nRBC 0.0 0.0 - 0.2 %   Neutrophils Relative % 72 %   Neutro Abs 2.8 1.7 - 7.7 K/uL   Lymphocytes Relative 16 %   Lymphs Abs 0.6 (L) 0.7 - 4.0 K/uL   Monocytes Relative 10 %   Monocytes Absolute 0.4 0.1 - 1.0 K/uL   Eosinophils Relative 1 %   Eosinophils Absolute 0.0 0.0 - 0.5 K/uL   Basophils Relative 0 %   Basophils Absolute 0.0 0.0 - 0.1 K/uL   Immature Granulocytes 1 %   Abs Immature Granulocytes 0.04 0.00 - 0.07 K/uL    Comment: Performed at [facility], [address], [place], [place] [address]  SARS Coronavirus 2 by RT PCR (hospital order, performed in [facility] hospital lab) *cepheid single result test* Peripheral     Status: None   Collection Time: 09/09/22  8:42 AM   Specimen: Peripheral; Nasal Swab  Result Value Ref Range   SARS Coronavirus 2 by RT PCR NEGATIVE NEGATIVE    Comment: Performed at [facility], [address] [place], [place] [address]  Comprehensive metabolic panel     Status: Abnormal   Collection Time: 09/09/22  8:42 AM  Result Value Ref Range   Sodium 137 135 - 145 mmol/L   Potassium 3.7 3.5 - 5.1 mmol/L   Chloride 104 98 - 111 mmol/L   CO2 20 (L) 22 - 32 mmol/L   Glucose, Bld 96 70 - 99 mg/dL    Comment: Glucose reference range applies only to samples taken after fasting for at least 8 hours.   BUN 11 8 - 23 mg/dL   Creatinine, Ser 4.69 0.61 - 1.24 mg/dL   Calcium 8.7 (L) 8.9 - 10.3 mg/dL   Total Protein 6.2 (L) 6.5 - 8.1 g/dL   Albumin 2.6 (L) 3.5 - 5.0 g/dL   AST 72  (H) 15 - 41 U/L   ALT 69 (H) 0 - 44 U/L   Alkaline Phosphatase 339 (H) 38 - 126 U/L   Total Bilirubin 0.8 0.3 -  1.2 mg/dL   GFR, Estimated >82 >95 mL/min    Comment: (NOTE) Calculated using the CKD-EPI Creatinine Equation (2021)    Anion gap 13 5 - 15    Comment: Performed at [facility], [address] [place], [place] [address]  Troponin I (High Sensitivity)     Status: None   Collection Time: 09/09/22  8:42 AM  Result Value Ref Range   Troponin I (High Sensitivity) 10 <18 ng/L    Comment: (NOTE) Elevated high sensitivity troponin I (hsTnI) values and significant  changes across serial measurements may suggest ACS but many other  chronic and acute conditions are known to elevate hsTnI results.  Refer to the "Links" section for chest pain algorithms and additional  guidance. Performed at [facility], [address] [place], [place] [address]   Lipase, blood     Status: None   Collection Time: 09/09/22  8:42 AM  Result Value Ref Range   Lipase 50 11 - 51 U/L    Comment: Performed at [facility], [address], [place], [place] [address]  Lactic acid, plasma     Status: None   Collection Time: 09/09/22  8:42 AM  Result Value Ref Range   Lactic Acid, Venous 1.5 0.5 - 1.9 mmol/L    Comment: Performed at [facility], [address], [place], [place] [address]   DG Chest Portable 1 View  Result Date: 09/09/2022 CLINICAL DATA:  Fever and chest pain. EXAM: PORTABLE CHEST 1 VIEW COMPARISON:  Chest x-ray June 24, 2016. FINDINGS: Left basilar airspace opacities. No definite sizable pleural effusion. No visible pneumothorax. Cardiomediastinal silhouette is unchanged. Polyarticular degenerative change. IMPRESSION: Left basilar airspace opacities, suspicious for pneumonia. Electronically Signed   By: Feliberto Harts M.D.   On: 09/09/2022 09:01    Pending Labs Unresulted Labs (From admission, onward)     Start     Ordered   09/10/22 0500  CBC  Tomorrow  morning,   R        09/09/22 1131   09/10/22 0500  Basic metabolic panel  Tomorrow morning,   R        09/09/22 1131   09/09/22 1130  Expectorated Sputum Assessment w Gram Stain, Rflx to Resp Cult  (COPD / Pneumonia / Cellulitis / Lower Extremity Wound)  Once,   R        09/09/22 1131   09/09/22 1130  Legionella Pneumophila Serogp 1 Ur Ag  (COPD / Pneumonia / Cellulitis / Lower Extremity Wound)  Once,   R        09/09/22 1131   09/09/22 1130  Strep pneumoniae urinary antigen  (COPD / Pneumonia / Cellulitis / Lower Extremity Wound)  Once,   R        09/09/22 1131   09/09/22 0843  Blood culture (routine x 2)  BLOOD CULTURE X 2,   R (with STAT occurrences)      09/09/22 0842            Vitals/Pain Today's Vitals   09/09/22 0826 09/09/22 0829  BP:  (!) 182/68  Pulse:  82  Resp:  13  Temp:  (!) 101 F (38.3 C)  SpO2:  98%  Weight: 87.1 kg   Height: 6\' 1"  (1.854 m)     Isolation Precautions No active isolations  Medications Medications  cefTRIAXone (ROCEPHIN) 1 g in sodium chloride 0.9 % 100 mL IVPB (has no administration in time range)  azithromycin (ZITHROMAX) 500 mg in sodium chloride 0.9 % 250 mL IVPB (has no administration in time range)  colchicine tablet 0.6 mg (has no administration in time range)  acetaminophen (TYLENOL) tablet 1,000 mg (has no administration in time range)  HYDROmorphone (DILAUDID) injection 0.5 mg (has no administration in time range)  doxazosin (CARDURA) tablet 4 mg (has no administration in time range)  lisinopril (ZESTRIL) tablet 20 mg (has no administration in time range)  levothyroxine (SYNTHROID) tablet 175 mcg (has no administration in time range)  bisacodyl (DULCOLAX) EC tablet 10 mg (has no administration in time range)  polyethylene glycol (MIRALAX / GLYCOLAX) packet 17 g (has no administration in time range)  senna (SENOKOT) tablet 8.6 mg (has no administration in time range)  predniSONE (DELTASONE) tablet 20 mg (has no administration  in time range)  cefTRIAXone (ROCEPHIN) 2 g in sodium chloride 0.9 % 100 mL IVPB (has no administration in time range)  azithromycin (ZITHROMAX) tablet 500 mg (has no administration in time range)  ipratropium-albuterol (DUONEB) 0.5-2.5 (3) MG/3ML nebulizer solution 3 mL (has no administration in time range)  enoxaparin (LOVENOX) injection 40 mg (has no administration in time range)  promethazine (PHENERGAN) tablet 12.5 mg (has no administration in time range)  colchicine tablet 0.6 mg (has no administration in time range)  acetaminophen (TYLENOL) tablet 1,000 mg (1,000 mg Oral Given 09/09/22 0905)  lactated ringers bolus 1,000 mL (0 mLs Intravenous Stopped 09/09/22 1057)    Mobility walks     Focused Assessments    R Recommendations: See Admitting Provider Note  Report given to:   Additional Notes:

## 2022-09-09 NOTE — Procedures (Signed)
PROCEDURE SUMMARY:  Successful image-guided left thoracentesis. Yielded 450 mL of hazy amber fluid. Pt tolerated procedure well. No immediate complications. EBL = trace   Specimen was  sent for labs. CXR ordered.  Please see imaging section of Epic for full dictation.  Lynann Bologna Alizay Bronkema PA-C 09/09/2022 4:22 PM

## 2022-09-09 NOTE — Progress Notes (Signed)
CT chest result reviewed, IR thoracentesis ordered and Echo ordered.

## 2022-09-09 NOTE — H&P (Signed)
History and Physical    Samuel Little ZOX:[id number] DOB: 08-16-1932 DOA: 09/09/2022  PCP: Georgann Housekeeper, MD (Confirm with patient/family/NH records and if not entered, this has to be entered at [facility] point of entry) Patient coming from: Home  I have personally briefly reviewed patient's old medical records in [facility]  Chief Complaint: Chest pain, SOB  HPI: Samuel Little is a 89 y.o. male with medical history significant of HTN, hypothyroidism, gout, paroxysmal SVT s/p ablation 2010, presented with worsening of chest pain and cough.  Patient underwent a beach vacation and came back last Monday usual state.  Friday patient started to have left-sided chest pain, sharp, worsening with deep breath and moving chest, no cough or fever at that point.  Next day, patient developed gouty flareup with bilateral knee swelling and pain and went to see PCP who started patient on prednisone tapering doses.  Over the weekend and Monday, the flareup of bilateral knee gout significant proved, left-sided chest pain also improved.  However yesterday, left-sided chest pain and came back and became worse along with dry cough.  This morning patient woke up with more severe left-sided chest pain and fever 101.0 at home.  ED Course:  101.1, none tachycardia, borderline hypoxic.  Patient 94% on room air.  WBC 3.9, lactate is 1.8 creatinine 0.7, COVID negative.  X-ray showed left-sided pneumonia.  Patient was started on ceftriaxone and azithromycin in the ED.  Review of Systems: As per HPI otherwise 14 point review of systems negative.   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 , march 2,2011    Arthritis    Diverticulosis    Effusion, pericardium    Gastroesophageal reflux disease    Gout    Hard of hearing    wears bilateral hearing aids   History of kidney stones    Hypertension     Hypothyroidism    Macular degeneration    Pleural effusion, left    Right bundle branch block    Thyroid disease    hypothyroidism   Wears glasses     Past Surgical History:  Procedure Laterality Date   ARTHROSCOPY KNEE W/ DRILLING Right    CARDIAC CATHETERIZATION  2007   CARDIAC ELECTROPHYSIOLOGY STUDY AND ABLATION     CATARACT EXTRACTION     CHEST TUBE INSERTION     CHOLECYSTECTOMY  06/26/2016   Procedure: LAPAROSCOPIC CHOLECYSTECTOMY  subtotal;  Surgeon: Almond Lint, MD;  Location: [facility] OR;  Service: General;;   FRACTURE SURGERY Right 2001   right leg   HERNIA REPAIR     MASS EXCISION Right 09/22/2015   Procedure: RIGHT THUMB EXCISION MASS;  Surgeon: Betha Loa, MD;  Location: [facility];  Service: Orthopedics;  Laterality: Right;   ROTATOR CUFF REPAIR     ROTATOR CUFF REPAIR Right 2002   TONSILLECTOMY     TOTAL KNEE ARTHROPLASTY Right 02/13/2017   Procedure: TOTAL KNEE ARTHROPLASTY;  Surgeon: Dannielle Huh, MD;  Location: [facility];  Service: Orthopedics;  Laterality: Right;     reports that he has never smoked. He has never used smokeless tobacco. He reports that he does not drink alcohol and does not use drugs.  Allergies  Allergen Reactions   Nsaids Other (See Comments)    JAUNDICE   Celecoxib Nausea And Vomiting   Meperidine Hcl Nausea Only    Family History  Problem Relation Age  of Onset   Hypertension Mother    Diabetes Mother    Hypertension Father    Heart attack Father    Diabetes Father    Heart attack Brother    Neuropathy Brother    Heart attack Paternal Uncle    Heart attack Paternal Uncle    Heart attack Paternal Uncle    Other Son        Leak in aorta     Prior to Admission medications   Medication Sig Start Date End Date Taking? Authorizing Provider  acetaminophen (TYLENOL) 650 MG CR tablet Take 1,300 mg every 8 (eight) hours as needed by mouth for pain.     [provider]  Alpha-Lipoic Acid 100 MG CAPS Take by mouth.     [provider]  Ascorbic Acid (VITAMIN C) 500 MG CAPS Take 500 mg by mouth daily.     [provider]  Bilberry 100 MG CAPS Take 100 mg daily by mouth.     [provider]  bisacodyl (DULCOLAX) 5 MG EC tablet Take 10 mg daily as needed by mouth for moderate constipation.    [provider]  CALCIUM PO Take by mouth.    [provider]  cholecalciferol (VITAMIN D) 1000 units tablet Take 1,000 Units daily by mouth.     [provider]  Coenzyme Q10 (CO Q 10 PO) Take 120 mg daily by mouth.     [provider]  colchicine 0.6 MG tablet Take 1 tablet (0.6 mg total) by mouth daily as needed (gout). Please take daily for next 5 days until gout flare is resolved, then as needed Patient taking differently: Take 0.6 mg by mouth daily as needed (for gout). 06/28/16   Rai, Ripudeep K, MD  CRANBERRY PO Take 500 mg daily by mouth.     [provider]  Cyanocobalamin (VITAMIN B-12 PO) Take 1 tablet daily by mouth.    [provider]  doxazosin (CARDURA) 4 MG tablet Take 4 mg by mouth at bedtime.     [provider]  Flaxseed, Linseed, (FLAX SEED OIL PO) Take by mouth.    [provider]  GINKGO BILOBA PO Take 120 mg daily by mouth.     [provider]  Glucos-Chondroit-Hyaluron-MSM (GLUCOSAMINE CHONDROITIN JOINT PO) Take 2 tablets daily by mouth.     [provider]  levothyroxine (SYNTHROID, LEVOTHROID) 175 MCG tablet Take 175 mcg by mouth daily before breakfast.    [provider]  lisinopril (PRINIVIL,ZESTRIL) 20 MG tablet Take 1 tablet (20 mg total) by mouth daily. 06/28/16   Rai, Ripudeep Kirtland Bouchard, MD  Lutein 6 MG CAPS Take 6 mg daily by mouth.    [provider]  Omega-3 Fatty Acids (FISH OIL) 1000 MG CAPS Take 1,000 mg daily by mouth.    [provider]  OVER THE COUNTER MEDICATION Take 2 tablets 2 (two) times daily by mouth. Macular Protect Complete Supplement     [provider]  polyethylene glycol (MIRALAX / GLYCOLAX) packet Take 17 g daily as needed by mouth for moderate constipation.    [provider]  RESVERATROL PO Take 1 tablet by mouth daily.     [provider]  senna (SENOKOT) 8.6 MG TABS tablet Take 1 tablet (8.6 mg total) by mouth at bedtime as needed for mild constipation. Patient taking differently: Take 1 tablet by mouth as needed for mild constipation. 12/20/13   Dhungel, Theda Belfast, MD  TART CHERRY PO Take  by mouth.    [provider]  Trolamine Salicylate (ASPERCREME EX) Apply 1 application as needed topically (for muscle pain).    [provider]  Turmeric 500 MG CAPS Take 500 mg 2 (two) times daily by mouth.     [provider]    Physical Exam: Vitals:   09/09/22 0826 09/09/22 0829  BP:  (!) 182/68  Pulse:  82  Resp:  13  Temp:  (!) 101 F (38.3 C)  SpO2:  98%  Weight: 87.1 kg   Height: 6\' 1"  (1.854 m)     Constitutional: NAD, calm, comfortable Vitals:   09/09/22 0826 09/09/22 0829  BP:  (!) 182/68  Pulse:  82  Resp:  13  Temp:  (!) 101 F (38.3 C)  SpO2:  98%  Weight: 87.1 kg   Height: 6\' 1"  (1.854 m)    Eyes: PERRL, lids and conjunctivae normal ENMT: Mucous membranes are moist. Posterior pharynx clear of any exudate or lesions.Normal dentition.  Neck: normal, supple, no masses, no thyromegaly Respiratory: Diminished breath sound left lower field, no wheezing, no crackles.  Increasing respiratory effort. No accessory muscle use.  Cardiovascular: Regular rate and rhythm, no murmurs / rubs / gallops. No extremity edema. 2+ pedal pulses. No carotid bruits.  Abdomen: no tenderness, no masses palpated. No hepatosplenomegaly. Bowel sounds positive.  Musculoskeletal: no clubbing / cyanosis. No joint deformity upper and lower extremities. Good ROM, no contractures. Normal muscle tone.  Skin: no rashes, lesions, ulcers. No induration Neurologic: CN 2-12 grossly intact.  Sensation intact, DTR normal. Strength 5/5 in all 4.  Psychiatric: Normal judgment and insight. Alert and oriented x 3. Normal mood.    Labs on Admission: I have personally reviewed following labs and imaging studies  CBC: Recent Labs  Lab 09/09/22 0842  WBC 3.9*  NEUTROABS 2.8  HGB 10.8*  HCT 32.7*  MCV 97.9  PLT 172   Basic Metabolic Panel: Recent Labs  Lab 09/09/22 0842  NA 137  K 3.7  CL 104  CO2 20*  GLUCOSE 96  BUN 11  CREATININE 0.74  CALCIUM 8.7*   GFR: Estimated Creatinine Clearance: 72.1 mL/min (by C-G formula based on SCr of 0.74 mg/dL). Liver Function Tests: Recent Labs  Lab 09/09/22 0842  AST 72*  ALT 69*  ALKPHOS 339*  BILITOT 0.8  PROT 6.2*  ALBUMIN 2.6*   Recent Labs  Lab 09/09/22 0842  LIPASE 50   No results for input(s): "AMMONIA" in the last 168 hours. Coagulation Profile: No results for input(s): "INR", "PROTIME" in the last 168 hours. Cardiac Enzymes: No results for input(s): "CKTOTAL", "CKMB", "CKMBINDEX", "TROPONINI" in the last 168 hours. BNP (last 3 results) No results for input(s): "PROBNP" in the last 8760 hours. HbA1C: No results for input(s): "HGBA1C" in the last 72 hours. CBG: No results for input(s): "GLUCAP" in the last 168 hours. Lipid Profile: No results for input(s): "CHOL", "HDL", "LDLCALC", "TRIG", "CHOLHDL", "LDLDIRECT" in the last 72 hours. Thyroid Function Tests: No results for input(s): "TSH", "T4TOTAL", "FREET4", "T3FREE", "THYROIDAB" in the last 72 hours. Anemia Panel: No results for input(s): "VITAMINB12", "FOLATE", "FERRITIN", "TIBC", "IRON", "RETICCTPCT" in the last 72 hours. Urine analysis:    Component Value Date/Time   COLORURINE AMBER (A) 06/23/2016 1834   APPEARANCEUR HAZY (A) 06/23/2016 1834   LABSPEC 1.015 06/23/2016 1834   PHURINE 5.0 06/23/2016 1834   GLUCOSEU NEGATIVE 06/23/2016 1834   HGBUR NEGATIVE 06/23/2016 1834   BILIRUBINUR NEGATIVE 06/23/2016 1834   KETONESUR  NEGATIVE 06/23/2016  1834   PROTEINUR NEGATIVE 06/23/2016 1834   UROBILINOGEN 0.2 12/18/2013 0036   NITRITE NEGATIVE 06/23/2016 1834   LEUKOCYTESUR NEGATIVE 06/23/2016 1834    Radiological Exams on Admission: DG Chest Portable 1 View  Result Date: 09/09/2022 CLINICAL DATA:  Fever and chest pain. EXAM: PORTABLE CHEST 1 VIEW COMPARISON:  Chest x-ray June 24, 2016. FINDINGS: Left basilar airspace opacities. No definite sizable pleural effusion. No visible pneumothorax. Cardiomediastinal silhouette is unchanged. Polyarticular degenerative change. IMPRESSION: Left basilar airspace opacities, suspicious for pneumonia. Electronically Signed   By: Feliberto Harts M.D.   On: 09/09/2022 09:01    EKG: Independently reviewed.  Sinus rhythm, no acute ST changes, chronic RBBB  Assessment/Plan Principal Problem:   PNA (pneumonia) Active Problems:   Essential hypertension   Gout   CAP (community acquired pneumonia)  (please populate well all problems here in Problem List. (For example, if patient is on BP meds at home and you resume or decide to hold them, it is a problem that needs to be her. Same for CAD, COPD, HLD and so on)  LLL PNA, bacterial -Will treat as CAP, continue ceftriaxone and azithromycin -Clinically suspect pleural effusion, rule out empyema, CT chest without contrast ordered -Sputum culture, breathing treatment -Other supportive care incentive spirometry and flutter valve -Other DDx, no history of aspiration, low suspicion for aspiration pneumonia at this point.  Chest pain -Pleuritic chest pain, as discussed above, ordered CT chest to rule out pleural effusion. -May need thoracentesis  Gouty flare up -Continue prednisone 20 mg daily, expect slow tapering to follow -PT evaluation  HTN, uncontrolled -Continue losartan, add as needed hydralazine  Hypothyroidism -Check TSH, continue Synthroid  DVT prophylaxis: Lovenox Code Status: Full code Family Communication: Wife at  bedside Disposition Plan: Patient is sick with left-sided pneumonia, clinically suspect empyema as well, requiring IV antibiotics and further inpatient workup, expect more than 2 midnight hospital stay Consults called: None Admission status: Tele admit   Emeline General MD [facility] Pager [phone]  09/09/2022, 12:23 PM

## 2022-09-09 NOTE — ED Provider Notes (Signed)
[place] EMERGENCY DEPARTMENT AT [facility] Provider Note   CSN: [id number] Arrival date & time: 09/09/22  7564     History Chief Complaint  Patient presents with   Chest Pain    Muscle skeletal pain    HPI Samuel Little is a 89 y.o. male presenting for chief complaint of chest pain.  Patient is a poor historian and does is not able to relay any information.  His wife arrived and states that he was under increased musculoskeletal stress last week from activity around the house and has had left-sided rib pain.  His PCP thought it was a gout flare and started on prednisone.  He did improve for a few days but now has had interval worsening again.  This morning had a fever cough shortness of breath congestion fatigue malaise.  Neither patient nor wife can provide much medical history.  Chart review revealsHe has a history of hypertension, hypothyroidism, CKD, sepsis   Patient's recorded medical, surgical, social, medication list and allergies were reviewed in the Snapshot window as part of the initial history.   Review of Systems   Review of Systems  Constitutional:  Positive for fever. Negative for chills.  HENT:  Negative for ear pain and sore throat.   Eyes:  Negative for pain and visual disturbance.  Respiratory:  Positive for cough and shortness of breath.   Cardiovascular:  Negative for chest pain and palpitations.  Gastrointestinal:  Negative for abdominal pain and vomiting.  Genitourinary:  Negative for dysuria and hematuria.  Musculoskeletal:  Negative for arthralgias and back pain.  Skin:  Negative for color change and rash.  Neurological:  Negative for seizures and syncope.  All other systems reviewed and are negative.   Physical Exam Updated Vital Signs BP (!) 122/54 (BP Location: Right Arm)   Pulse 70   Temp 98.1 F (36.7 C) (Oral)   Resp 20   Ht 6\' 1"  (1.854 m)   Wt 87.1 kg   SpO2 92%   BMI 25.33 kg/m  Physical Exam Vitals and nursing note  reviewed.  Constitutional:      General: He is not in acute distress.    Appearance: He is well-developed.  HENT:     Head: Normocephalic and atraumatic.  Eyes:     Conjunctiva/sclera: Conjunctivae normal.  Cardiovascular:     Rate and Rhythm: Normal rate and regular rhythm.     Heart sounds: No murmur heard. Pulmonary:     Effort: Pulmonary effort is normal. No respiratory distress.     Breath sounds: Normal breath sounds.  Abdominal:     Palpations: Abdomen is soft.     Tenderness: There is no abdominal tenderness.  Musculoskeletal:        General: No swelling.     Cervical back: Neck supple.  Skin:    General: Skin is warm and dry.     Capillary Refill: Capillary refill takes less than 2 seconds.  Neurological:     Mental Status: He is alert.  Psychiatric:        Mood and Affect: Mood normal.      ED Course/ Medical Decision Making/ A&P    Procedures Procedures   Medications Ordered in ED Medications  azithromycin (ZITHROMAX) 500 mg in sodium chloride 0.9 % 250 mL IVPB (has no administration in time range)  acetaminophen (TYLENOL) tablet 1,000 mg (has no administration in time range)  HYDROmorphone (DILAUDID) injection 0.5 mg (0.5 mg Intravenous Given 09/09/22 1245)  doxazosin (  CARDURA) tablet 4 mg (has no administration in time range)  lisinopril (ZESTRIL) tablet 20 mg (has no administration in time range)  levothyroxine (SYNTHROID) tablet 175 mcg (has no administration in time range)  bisacodyl (DULCOLAX) EC tablet 10 mg (has no administration in time range)  polyethylene glycol (MIRALAX / GLYCOLAX) packet 17 g (has no administration in time range)  senna (SENOKOT) tablet 8.6 mg (has no administration in time range)  predniSONE (DELTASONE) tablet 20 mg (20 mg Oral Given 09/09/22 1225)  cefTRIAXone (ROCEPHIN) 2 g in sodium chloride 0.9 % 100 mL IVPB (has no administration in time range)  azithromycin (ZITHROMAX) tablet 500 mg (has no administration in time range)   ipratropium-albuterol (DUONEB) 0.5-2.5 (3) MG/3ML nebulizer solution 3 mL (3 mLs Nebulization Not Given 09/09/22 1452)  enoxaparin (LOVENOX) injection 40 mg (40 mg Subcutaneous Given 09/09/22 1231)  promethazine (PHENERGAN) tablet 12.5 mg (has no administration in time range)  hydrALAZINE (APRESOLINE) injection 5 mg (has no administration in time range)  guaiFENesin (MUCINEX) 12 hr tablet 1,200 mg (has no administration in time range)  acetaminophen (TYLENOL) tablet 1,000 mg (1,000 mg Oral Given 09/09/22 0905)  lactated ringers bolus 1,000 mL (0 mLs Intravenous Stopped 09/09/22 1057)  cefTRIAXone (ROCEPHIN) 1 g in sodium chloride 0.9 % 100 mL IVPB (1 g Intravenous New Bag/Given 09/09/22 1220)  lidocaine (XYLOCAINE) 1 % (with pres) injection 20 mL (7 mLs Intradermal Given 09/09/22 1611)    Medical Decision Making:   This is an 89 year old male presenting with fever cough shortness of breath.  He is ambulatory tolerating p.o. intake.  He is coughing not coughing anything up. States that this started with lifting items around the house. No known sick contacts. Differential is broad includes ACS, PE, pneumonia, pneumothorax. Broad screening evaluation including CBC, CMP, CXR all initiated as well as EKG and delta troponin.  Reassessment: Lab work grossly reassuring.  No acute pathology.  Patient does have a left-sided pneumonia.  He is continued to be dyspneic.  Treated with ceftriaxone and azithromycin per local standards arrange for admission for further care and management.  Disposition:  I have considered need for hospitalization, however, considering all of the above, I believe this patient is stable for discharge at this time.  Patient/family educated about specific return precautions for given chief complaint and symptoms.  Patient/family educated about follow-up with PCP.     Patient/family expressed understanding of return precautions and need for follow-up. Patient spoken to regarding all  imaging and laboratory results and appropriate follow up for these results. All education provided in verbal form with additional information in written form. Time was allowed for answering of patient questions. Patient discharged.    Emergency Department Medication Summary:   Medications  azithromycin (ZITHROMAX) 500 mg in sodium chloride 0.9 % 250 mL IVPB (has no administration in time range)  acetaminophen (TYLENOL) tablet 1,000 mg (has no administration in time range)  HYDROmorphone (DILAUDID) injection 0.5 mg (0.5 mg Intravenous Given 09/09/22 1245)  doxazosin (CARDURA) tablet 4 mg (has no administration in time range)  lisinopril (ZESTRIL) tablet 20 mg (has no administration in time range)  levothyroxine (SYNTHROID) tablet 175 mcg (has no administration in time range)  bisacodyl (DULCOLAX) EC tablet 10 mg (has no administration in time range)  polyethylene glycol (MIRALAX / GLYCOLAX) packet 17 g (has no administration in time range)  senna (SENOKOT) tablet 8.6 mg (has no administration in time range)  predniSONE (DELTASONE) tablet 20 mg (20 mg Oral Given 09/09/22 1225)  cefTRIAXone (ROCEPHIN) 2 g in sodium chloride 0.9 % 100 mL IVPB (has no administration in time range)  azithromycin (ZITHROMAX) tablet 500 mg (has no administration in time range)  ipratropium-albuterol (DUONEB) 0.5-2.5 (3) MG/3ML nebulizer solution 3 mL (3 mLs Nebulization Not Given 09/09/22 1452)  enoxaparin (LOVENOX) injection 40 mg (40 mg Subcutaneous Given 09/09/22 1231)  promethazine (PHENERGAN) tablet 12.5 mg (has no administration in time range)  hydrALAZINE (APRESOLINE) injection 5 mg (has no administration in time range)  guaiFENesin (MUCINEX) 12 hr tablet 1,200 mg (has no administration in time range)  acetaminophen (TYLENOL) tablet 1,000 mg (1,000 mg Oral Given 09/09/22 0905)  lactated ringers bolus 1,000 mL (0 mLs Intravenous Stopped 09/09/22 1057)  cefTRIAXone (ROCEPHIN) 1 g in sodium chloride 0.9 % 100 mL IVPB  (1 g Intravenous New Bag/Given 09/09/22 1220)  lidocaine (XYLOCAINE) 1 % (with pres) injection 20 mL (7 mLs Intradermal Given 09/09/22 1611)        Clinical Impression:  1. Chest pain, unspecified type   2. Pneumonia of left lung due to infectious organism, unspecified part of lung      Admit   Final Clinical Impression(s) / ED Diagnoses Final diagnoses:  Chest pain, unspecified type  Pneumonia of left lung due to infectious organism, unspecified part of lung    Rx / DC Orders ED Discharge Orders     None         Glyn Ade, MD 09/09/22 [phone]

## 2022-09-10 ENCOUNTER — Inpatient Hospital Stay (HOSPITAL_COMMUNITY): Payer: Medicare Other

## 2022-09-10 DIAGNOSIS — I080 Rheumatic disorders of both mitral and aortic valves: Secondary | ICD-10-CM

## 2022-09-10 DIAGNOSIS — I5030 Unspecified diastolic (congestive) heart failure: Secondary | ICD-10-CM

## 2022-09-10 DIAGNOSIS — I3131 Malignant pericardial effusion in diseases classified elsewhere: Secondary | ICD-10-CM

## 2022-09-10 DIAGNOSIS — R0781 Pleurodynia: Secondary | ICD-10-CM | POA: Diagnosis present

## 2022-09-10 DIAGNOSIS — J189 Pneumonia, unspecified organism: Secondary | ICD-10-CM | POA: Diagnosis not present

## 2022-09-10 DIAGNOSIS — I77810 Thoracic aortic ectasia: Secondary | ICD-10-CM

## 2022-09-10 DIAGNOSIS — D649 Anemia, unspecified: Secondary | ICD-10-CM | POA: Diagnosis present

## 2022-09-10 DIAGNOSIS — I3139 Other pericardial effusion (noninflammatory): Secondary | ICD-10-CM

## 2022-09-10 DIAGNOSIS — I517 Cardiomegaly: Secondary | ICD-10-CM

## 2022-09-10 LAB — ECHOCARDIOGRAM COMPLETE
Area-P 1/2: 2.56 cm2
Height: 73 in
MV M vel: 1.3 m/s
MV Peak grad: 6.7 mmHg
P 1/2 time: 1239 msec
S' Lateral: 3.3 cm
Weight: 3072 oz

## 2022-09-10 LAB — CBC
HCT: 26.2 % — ABNORMAL LOW (ref 39.0–52.0)
Hemoglobin: 8.8 g/dL — ABNORMAL LOW (ref 13.0–17.0)
MCH: 32.5 pg (ref 26.0–34.0)
MCHC: 33.6 g/dL (ref 30.0–36.0)
MCV: 96.7 fL (ref 80.0–100.0)
Platelets: 135 10*3/uL — ABNORMAL LOW (ref 150–400)
RBC: 2.71 MIL/uL — ABNORMAL LOW (ref 4.22–5.81)
RDW: 15.8 % — ABNORMAL HIGH (ref 11.5–15.5)
WBC: 4.6 10*3/uL (ref 4.0–10.5)
nRBC: 0 % (ref 0.0–0.2)

## 2022-09-10 LAB — CULTURE, BLOOD (ROUTINE X 2)
Culture: NO GROWTH
Special Requests: ADEQUATE

## 2022-09-10 LAB — BASIC METABOLIC PANEL
Anion gap: 6 (ref 5–15)
BUN: 15 mg/dL (ref 8–23)
CO2: 23 mmol/L (ref 22–32)
Calcium: 8.4 mg/dL — ABNORMAL LOW (ref 8.9–10.3)
Chloride: 105 mmol/L (ref 98–111)
Creatinine, Ser: 0.75 mg/dL (ref 0.61–1.24)
GFR, Estimated: >60 mL/min (ref >60)
Glucose, Bld: 95 mg/dL (ref 70–99)
Potassium: 3.7 mmol/L (ref 3.5–5.1)
Sodium: 134 mmol/L — ABNORMAL LOW (ref 135–145)

## 2022-09-10 LAB — BODY FLUID CULTURE W GRAM STAIN

## 2022-09-10 MED ORDER — IPRATROPIUM-ALBUTEROL 0.5-2.5 (3) MG/3ML IN SOLN: 3.0000 mL | RESPIRATORY_TRACT | Status: DC | PRN

## 2022-09-10 NOTE — Progress Notes (Signed)
  Echocardiogram 2D Echocardiogram has been performed.  Samuel Little 09/10/2022, 10:57 AM

## 2022-09-10 NOTE — Progress Notes (Signed)
Progress Note   Patient: Samuel Little ZOX:[id number] DOB: 09/30/1932 DOA: 09/09/2022     1 DOS: the patient was seen and examined on 09/10/2022   Brief hospital course: 89yo male with h/o HTN, hypothyroidism, and SVT s/p ablation (2010) who presented on 6/28 with CP/SOB.  He was found to have LLL PNA with effusion and underwent thoracentesis, draining 450 cc of hazy amber fluid which was c/w exudative effusion.  Assessment and Plan:  LLL CAP with effusion -Patient presenting with CP/SOB -Found to have infiltrate in left lower lobe on chest x-ray with effusion -Thoracentesis performed with fluid c/w exudate -This appears to be most likely community-acquired pneumonia.  -COVID-19 negative. -Blood and pleural fluid cultures are pending, NTD -Legionella and Strep pneumo testing is pending -CURB-65 score is 1 - will admit the patient to Med Surg. -Pneumonia Severity Index (PSI) is Class 4, 9% mortality. -Will start Azithromycin 500 mg IV daily and Rocephin due to no risk factors for MDR cause  -Additional complicating factors include: associated pleural effusion, s/p thoracentesis -Sputum culture, breathing treatment -Other supportive care incentive spirometry and flutter valve   Pleuritic chest pain -Due to effusion -s/p thoracentesis -Echo completed on 6/29 with preserved EF, grade 1 diastolic dysfunction  Anemia -Normocytic anemia -Hgb dropped 2 grams from admission, currently 8.8 -Continue to follow   Gouty flare up -Continue prednisone 20 mg daily, expect slow tapering to follow -PT evaluation   HTN, uncontrolled -Continue losartan, add as needed hydralazine   Hypothyroidism -Normal TSH -Continue Synthroid     Subjective: He is pleasant and conversant.  Reports feeling some better, still with left-sided CP.  Still with cough.  No SOB.  On RA.  Physical Exam: Vitals:   09/10/22 0000 09/10/22 0429 09/10/22 0808 09/10/22 0833  BP: (!) 123/56 117/64  139/60  Pulse:  61 61 68   Resp: (!) 22 18 20    Temp: 98.2 F (36.8 C) 98 F (36.7 C)    TempSrc: Oral Oral    SpO2: 94% 95% 93%   Weight:      Height:       General:  Appears calm and comfortable and is in NAD Eyes:  EOMI, normal lids, iris ENT:  Hard of hearing, grossly normal lips & tongue, mmm Neck:  no LAD, masses or thyromegaly Cardiovascular:  RRR, no m/r/g. No LE edema.  Respiratory:   CTA bilaterally with no wheezes/rales/rhonchi.  Normal to mildly increased respiratory effort. Abdomen:  soft, NT, ND Skin:  no rash or induration seen on limited exam Musculoskeletal:  grossly normal tone BUE/BLE, good ROM, no bony abnormality Psychiatric:  grossly normal mood and affect, speech fluent and appropriate, AOx3 Neurologic:  CN 2-12 grossly intact, moves all extremities in coordinated fashion   Radiological Exams on Admission: Independently reviewed - see discussion in A/P where applicable  ECHOCARDIOGRAM COMPLETE  Result Date: 09/10/2022    ECHOCARDIOGRAM REPORT   Patient Name:   Samuel Little Date of Exam: 09/10/2022 Medical Rec #:  [id number]      Height:       73.0 in Accession #:    [id number]     Weight:       192.0 lb Date of Birth:  Jun 04, 1932      BSA:          2.115 m Patient Age:    89 years       BP:           139/60 mmHg Patient Gender:  M              HR:           68 bpm. Exam Location:  Inpatient Procedure: 2D Echo, Cardiac Doppler and Color Doppler Indications:    Pericardial effusion I31.3  History:        Patient has no prior history of Echocardiogram examinations.                 CAP, Arrythmias:RBBB; Risk Factors:Hypertension and Non-Smoker.  Sonographer:    Aron Baba Referring Phys: [id number] Emeline General  Sonographer Comments: Image acquisition challenging due to respiratory motion. IMPRESSIONS  1. Left ventricular ejection fraction, by estimation, is 60 to 65%. The left ventricle has normal function. The left ventricle has no regional wall motion abnormalities. Left ventricular  diastolic parameters are consistent with Grade I diastolic dysfunction (impaired relaxation).  2. Right ventricular systolic function is normal. The right ventricular size is mildly enlarged. There is normal pulmonary artery systolic pressure.  3. Left atrial size was mildly dilated.  4. A small pericardial effusion is present. The pericardial effusion is lateral to the left ventricle. Moderate pleural effusion in the left lateral region.  5. The mitral valve is normal in structure. Trivial mitral valve regurgitation. No evidence of mitral stenosis.  6. The aortic valve is tricuspid. There is severe calcifcation of the aortic valve. Aortic valve regurgitation is mild. No aortic stenosis is present.  7. Aortic dilatation noted. There is moderate dilatation of the aortic root, measuring 46 mm. There is moderate dilatation of the ascending aorta, measuring 45 mm.  8. The inferior vena cava is normal in size with greater than 50% respiratory variability, suggesting right atrial pressure of 3 mmHg. Comparison(s): No prior Echocardiogram. FINDINGS  Left Ventricle: Left ventricular ejection fraction, by estimation, is 60 to 65%. The left ventricle has normal function. The left ventricle has no regional wall motion abnormalities. The left ventricular internal cavity size was normal in size. There is  no left ventricular hypertrophy. Left ventricular diastolic parameters are consistent with Grade I diastolic dysfunction (impaired relaxation). Right Ventricle: The right ventricular size is mildly enlarged. No increase in right ventricular wall thickness. Right ventricular systolic function is normal. There is normal pulmonary artery systolic pressure. The tricuspid regurgitant velocity is 1.94  m/s, and with an assumed right atrial pressure of 8 mmHg, the estimated right ventricular systolic pressure is 23.1 mmHg. Left Atrium: Left atrial size was mildly dilated. Right Atrium: Right atrial size was normal in size.  Pericardium: A small pericardial effusion is present. The pericardial effusion is lateral to the left ventricle. Mitral Valve: The mitral valve is normal in structure. Mild mitral annular calcification. Trivial mitral valve regurgitation. No evidence of mitral valve stenosis. Tricuspid Valve: The tricuspid valve is normal in structure. Tricuspid valve regurgitation is trivial. No evidence of tricuspid stenosis. Aortic Valve: The aortic valve is tricuspid. There is severe calcifcation of the aortic valve. Aortic valve regurgitation is mild. Aortic regurgitation PHT measures 1239 msec. No aortic stenosis is present. Pulmonic Valve: The pulmonic valve was not well visualized. Pulmonic valve regurgitation is trivial. No evidence of pulmonic stenosis. Aorta: Aortic dilatation noted. There is moderate dilatation of the aortic root, measuring 46 mm. There is moderate dilatation of the ascending aorta, measuring 45 mm. Venous: The inferior vena cava is normal in size with greater than 50% respiratory variability, suggesting right atrial pressure of 3 mmHg. IAS/Shunts: No atrial level shunt detected by color flow  Doppler. Additional Comments: There is a moderate pleural effusion in the left lateral region.  LEFT VENTRICLE PLAX 2D LVIDd:         4.90 cm   Diastology LVIDs:         3.30 cm   LV e' medial:    7.38 cm/s LV PW:         1.10 cm   LV E/e' medial:  10.6 LV IVS:        1.10 cm   LV e' lateral:   6.41 cm/s LVOT diam:     2.30 cm   LV E/e' lateral: 12.2 LV SV:         121 LV SV Index:   57 LVOT Area:     4.15 cm  RIGHT VENTRICLE RV S prime:     11.60 cm/s TAPSE (M-mode): 1.6 cm LEFT ATRIUM           Index        RIGHT ATRIUM           Index LA diam:      3.70 cm 1.75 cm/m   RA Area:     12.80 cm LA Vol (A2C): 83.5 ml 39.49 ml/m  RA Volume:   27.60 ml  13.05 ml/m LA Vol (A4C): 61.6 ml 29.13 ml/m  AORTIC VALVE LVOT Vmax:   131.00 cm/s LVOT Vmean:  87.400 cm/s LVOT VTI:    0.291 m AI PHT:      1239 msec  AORTA Ao  Root diam: 4.60 cm Ao Asc diam:  4.40 cm MITRAL VALVE               TRICUSPID VALVE MV Area (PHT): 2.56 cm    TR Peak grad:   15.1 mmHg MV Decel Time: 296 msec    TR Vmax:        194.00 cm/s MR Peak grad: 6.7 mmHg MR Vmax:      129.50 cm/s  SHUNTS MV E velocity: 78.00 cm/s  Systemic VTI:  0.29 m MV A velocity: 66.30 cm/s  Systemic Diam: 2.30 cm MV E/A ratio:  1.18 Vishnu Priya Mallipeddi Electronically signed by Winfield Rast Mallipeddi Signature Date/Time: 09/10/2022/11:59:59 AM    Final    DG Chest 1 View  Result Date: 09/09/2022 CLINICAL DATA:  [id number] Pleural effusion on left [id number] EXAM: CHEST  1 VIEW COMPARISON:  Chest x-ray 09/09/2022, CT chest 09/09/2022 FINDINGS: The heart and mediastinal contours are unchanged. Aortic calcification. Persistent left base airspace opacity. No pulmonary edema. Persistent at least small volume left pleural effusion. No right pleural effusion. No pneumothorax. No acute osseous abnormality. IMPRESSION: 1. Persistent at least small volume left pleural effusion with associated airspace opacity at the left base. 2.  Aortic Atherosclerosis (ICD10-I70.0). Electronically Signed   By: Tish Frederickson M.D.   On: 09/09/2022 17:45   IR THORACENTESIS ASP PLEURAL SPACE W/IMG GUIDE  Result Date: 09/09/2022 INDICATION: 89 year old male presents with shortness of breath. Previous imaging showed left pleural effusion. Request for therapeutic and diagnostic thoracentesis EXAM: ULTRASOUND GUIDED LEFT THORACENTESIS MEDICATIONS: 10 mL 1% lidocaine COMPLICATIONS: None immediate. PROCEDURE: An ultrasound guided thoracentesis was thoroughly discussed with the patient and questions answered. The benefits, risks, alternatives and complications were also discussed. The patient understands and wishes to proceed with the procedure. Written consent was obtained. Ultrasound was performed to localize and mark an adequate pocket of fluid in the left chest. The area was then prepped and draped in the  normal sterile fashion. 1%  Lidocaine was used for local anesthesia. Under ultrasound guidance a 6 Fr Safe-T-Centesis catheter was introduced. Thoracentesis was performed. The catheter was removed and a dressing applied. FINDINGS: A total of approximately 450 mL of hazy fluid was removed. Samples were sent to the laboratory as requested by the clinical team. Post-procedural CXR ordered, results pending IMPRESSION: Successful ultrasound guided left thoracentesis yielding 450 mL of pleural fluid. Performed by: Lawernce Ion, PA-C Electronically Signed   By: Gilmer Mor D.O.   On: 09/09/2022 17:00   CT CHEST WO CONTRAST  Result Date: 09/09/2022 CLINICAL DATA:  Suspect pneumonia. EXAM: CT CHEST WITHOUT CONTRAST TECHNIQUE: Multidetector CT imaging of the chest was performed following the standard protocol without IV contrast. RADIATION DOSE REDUCTION: This exam was performed according to the departmental dose-optimization program which includes automated exposure control, adjustment of the mA and/or kV according to patient size and/or use of iterative reconstruction technique. COMPARISON:  02/07/2017 FINDINGS: Cardiovascular: Mild cardiomegaly. Small amount of pericardial fluid. Calcified plaque over the left main and 3 vessel coronary arteries. Stable 4.3 cm thoracic aortic aneurysm involving the proximal ascending aorta. Calcified plaque throughout the thoracic aorta. Remaining vascular structures are unchanged. Mediastinum/Nodes: No significant mediastinal or hilar adenopathy on this noncontrast exam. Remaining mediastinal structures are unremarkable. Lungs/Pleura: There is a moderate size left pleural effusion with associated compressive small amount of fluid loculated within the inferior as well as superior aspect of the left major fissure. Minimal minimal peripheral interstitial prominence over the posterior right lower lobe and lingula. Calcified granuloma over the left upper lobe. Airways are unremarkable. Upper  Abdomen: Prior cholecystectomy. Calcified plaque over the abdominal aorta. Bilateral nephrolithiasis unchanged. 1.8 cm cyst over the mid pole right kidney with Hounsfield unit measurements 9.8 compatible with Bosniak 1 cyst. No further imaging follow-up recommended. Diverticulosis of the colon. Few nonspecific subcentimeter lymph nodes over the gastrohepatic ligament and periaortic region. Musculoskeletal: Degenerative changes. IMPRESSION: 1. Moderate size left pleural effusion with associated compressive atelectasis. 2. Mild cardiomegaly with small amount of pericardial fluid. Atherosclerotic coronary artery disease. 3. Stable 4.3 cm thoracic aortic aneurysm involving the proximal ascending aorta. Recommend annual imaging followup by CTA or MRA. This recommendation follows 2010 ACCF/AHA/AATS/ACR/ASA/SCA/SCAI/SIR/STS/SVM Guidelines for the Diagnosis and Management of Patients with Thoracic Aortic Disease. Circulation. 2010; 121: Z610-R604. Aortic aneurysm NOS (ICD10-I71.9). 4. Bilateral nephrolithiasis. 1.8 cm right renal cyst. No further imaging follow-up recommended. 5. Diverticulosis of the colon. 6. Aortic atherosclerosis. Aortic Atherosclerosis (ICD10-I70.0). Electronically Signed   By: Elberta Fortis M.D.   On: 09/09/2022 12:25   DG Chest Portable 1 View  Result Date: 09/09/2022 CLINICAL DATA:  Fever and chest pain. EXAM: PORTABLE CHEST 1 VIEW COMPARISON:  Chest x-ray June 24, 2016. FINDINGS: Left basilar airspace opacities. No definite sizable pleural effusion. No visible pneumothorax. Cardiomediastinal silhouette is unchanged. Polyarticular degenerative change. IMPRESSION: Left basilar airspace opacities, suspicious for pneumonia. Electronically Signed   By: Feliberto Harts M.D.   On: 09/09/2022 09:01    EKG: Independently reviewed.  NSR with rate 84; RBBB with no evidence of acute ischemia   Pertinent labs:    Na++ 134 WBC 4.6 Hgb 8.8 Platelets 135 Pleural fluid - abundant WBCs, no orgs,  culture pending Blood cultures pending   Family Communication: None present; I spoke with his wife by telephone   Disposition: Status is: Inpatient Remains inpatient appropriate because: ongoing treatment of PNA  Planned Discharge Destination: Home    Time spent: 35 minutes  Author: Jonah Blue, MD 09/10/2022 2:09 PM  For on call review [email or web address].

## 2022-09-10 NOTE — Hospital Course (Signed)
89yo male with h/o HTN, hypothyroidism, and SVT s/p ablation (2010) who presented on 6/28 with CP/SOB.  He was found to have LLL PNA with effusion and underwent thoracentesis, draining 450 cc of hazy amber fluid which was c/w exudative effusion.

## 2022-09-10 NOTE — Evaluation (Signed)
Physical Therapy Evaluation Patient Details Name: Samuel Little MRN: [id number] DOB: 01/09/1933 Today's Date: 09/10/2022  History of Present Illness  89 y/o M admitted to [facility] on 6/28 with chest pain and SOB. Found to have LLL PNA with effusion, undergoing thoracentesis 6/28. PMHx: HTN, hypothyroidism, SVT s/p ablation.  Clinical Impression  Pt tolerated today's session well, presenting close or at his mobility baseline. Pt able to perform all mobility with supervision for safety and line management, with use of RW. Pt without overt LOB throughout session, able to tolerate static standing without UE support to urinate, powering up well into standing with proper hand placement. Pt reports no concerns with mobility upon discharge, stair trial deferred today as pt had a need to urinate. Pt reports he feels he is at his mobility baseline, no further benefit from skilled acute PT at this time, no current need for PT upon discharge. Will sign off.        Recommendations for follow up therapy are one component of a multi-disciplinary discharge planning process, led by the attending physician.  Recommendations may be updated based on patient status, additional functional criteria and insurance authorization.  Follow Up Recommendations       Assistance Recommended at Discharge PRN  Patient can return home with the following  Assist for transportation;Help with stairs or ramp for entrance    Equipment Recommendations None recommended by PT  Recommendations for Other Services       Functional Status Assessment Patient has had a recent decline in their functional status and demonstrates the ability to make significant improvements in function in a reasonable and predictable amount of time.     Precautions / Restrictions Precautions Precautions: Fall Restrictions Weight Bearing Restrictions: No      Mobility  Bed Mobility Overal bed mobility: Needs Assistance Bed Mobility: Supine to Sit      Supine to sit: HOB elevated, Modified independent (Device/Increase time)     General bed mobility comments: modI with use of bed rail and HOB elevated    Transfers Overall transfer level: Needs assistance Equipment used: Rolling walker (2 wheels) Transfers: Sit to/from Stand Sit to Stand: Supervision           General transfer comment: assist for line management    Ambulation/Gait Ambulation/Gait assistance: Supervision Gait Distance (Feet): 150 Feet Assistive device: Rolling walker (2 wheels) Gait Pattern/deviations: Step-through pattern, Decreased stride length, Trunk flexed Gait velocity: grossly WFL     General Gait Details: increased trunk flexion with downward gaze, cueing for upright posture and proximity to RW and pt able to correct. No overt LOB, supervision for safety, good management of RW  Stairs            Wheelchair Mobility    Modified [name] (Stroke Patients Only)       Balance Overall balance assessment: Needs assistance Sitting-balance support: No upper extremity supported, Feet supported Sitting balance-Leahy Scale: Good     Standing balance support: Bilateral upper extremity supported, During functional activity, Reliant on assistive device for balance Standing balance-Leahy Scale: Poor Standing balance comment: reliant on RW for ambulation but able to stand statically to urinate without UE support                             Pertinent Vitals/Pain Pain Assessment Pain Assessment: Faces Faces Pain Scale: No hurt    Home Living Family/patient expects to be discharged to:: Private residence Living Arrangements: Spouse/significant  other Available Help at Discharge: Family;Available 24 hours/day Type of Home: House Home Access: Stairs to enter Entrance Stairs-Rails:  (B posts) Secretary/administrator of Steps: 1 threshold   Home Layout: One level (2 steps to change levels, has basement but doesn't go down to it) Home  Equipment: BSC/3in1;Rolling Walker (2 wheels);Shower seat;Grab bars - tub/shower Additional Comments: wife available full time, pt reports she is feeble, has a son who assists if needed    Prior Function Prior Level of Function : Independent/Modified Independent;Driving             Mobility Comments: RW and walking stick for mobility, uses RW in the house but walking stick after stepping down to change levels and out in the community, may have had one fall at the beginning of the year but unsure of how long ago ADLs Comments: independent, drives short distances, son drives longer trips or at night     Hand Dominance   Dominant Hand: Right    Extremity/Trunk Assessment   Upper Extremity Assessment Upper Extremity Assessment: Overall WFL for tasks assessed    Lower Extremity Assessment Lower Extremity Assessment: Overall WFL for tasks assessed    Cervical / Trunk Assessment Cervical / Trunk Assessment: Kyphotic  Communication   Communication: HOH  Cognition Arousal/Alertness: Awake/alert Behavior During Therapy: WFL for tasks assessed/performed Overall Cognitive Status: Within Functional Limits for tasks assessed                                 General Comments: A&Ox4, pleasant throughout session but very HOH and has hearing aides but the batteries are dead        General Comments General comments (skin integrity, edema, etc.): VSS on room air, no desat, no complaints of SOB    Exercises     Assessment/Plan    PT Assessment Patient does not need any further PT services  PT Problem List         PT Treatment Interventions      PT Goals (Current goals can be found in the Care Plan section)  Acute Rehab PT Goals Patient Stated Goal: go home PT Goal Formulation: All assessment and education complete, DC therapy    Frequency       Co-evaluation               AM-PAC PT "6 Clicks" Mobility  Outcome Measure Help needed turning from your  back to your side while in a flat bed without using bedrails?: None Help needed moving from lying on your back to sitting on the side of a flat bed without using bedrails?: None Help needed moving to and from a bed to a chair (including a wheelchair)?: A Little Help needed standing up from a chair using your arms (e.g., wheelchair or bedside chair)?: A Little Help needed to walk in hospital room?: A Little Help needed climbing 3-5 steps with a railing? : A Little 6 Click Score: 20    End of Session Equipment Utilized During Treatment: Gait belt Activity Tolerance: Patient tolerated treatment well Patient left: in chair;with call bell/phone within reach;with chair alarm set Nurse Communication: Mobility status PT Visit Diagnosis: Difficulty in walking, not elsewhere classified (R26.2)    Time: 1610-9604 PT Time Calculation (min) (ACUTE ONLY): 24 min   Charges:   PT Evaluation $PT Eval Low Complexity: 1 Low          Lindalou Hose, PT DPT Acute  Rehabilitation Services Office [phone]   Leonie Man 09/10/2022, 3:20 PM

## 2022-09-11 DIAGNOSIS — J189 Pneumonia, unspecified organism: Secondary | ICD-10-CM | POA: Diagnosis not present

## 2022-09-11 LAB — CBC WITH DIFFERENTIAL/PLATELET
Abs Immature Granulocytes: 0.06 10*3/uL (ref 0.00–0.07)
Basophils Absolute: 0 10*3/uL (ref 0.0–0.1)
Basophils Relative: 0 %
Eosinophils Absolute: 0.1 10*3/uL (ref 0.0–0.5)
Eosinophils Relative: 1 %
HCT: 28.1 % — ABNORMAL LOW (ref 39.0–52.0)
Hemoglobin: 9.3 g/dL — ABNORMAL LOW (ref 13.0–17.0)
Immature Granulocytes: 1 %
Lymphocytes Relative: 20 %
Lymphs Abs: 1.1 10*3/uL (ref 0.7–4.0)
MCH: 31.7 pg (ref 26.0–34.0)
MCHC: 33.1 g/dL (ref 30.0–36.0)
MCV: 95.9 fL (ref 80.0–100.0)
Monocytes Absolute: 0.6 10*3/uL (ref 0.1–1.0)
Monocytes Relative: 12 %
Neutro Abs: 3.4 10*3/uL (ref 1.7–7.7)
Neutrophils Relative %: 66 %
Platelets: 141 10*3/uL — ABNORMAL LOW (ref 150–400)
RBC: 2.93 MIL/uL — ABNORMAL LOW (ref 4.22–5.81)
RDW: 15.9 % — ABNORMAL HIGH (ref 11.5–15.5)
WBC: 5.3 10*3/uL (ref 4.0–10.5)
nRBC: 0 % (ref 0.0–0.2)

## 2022-09-11 LAB — BASIC METABOLIC PANEL
Anion gap: 7 (ref 5–15)
BUN: 18 mg/dL (ref 8–23)
CO2: 23 mmol/L (ref 22–32)
Calcium: 8.3 mg/dL — ABNORMAL LOW (ref 8.9–10.3)
Chloride: 103 mmol/L (ref 98–111)
Creatinine, Ser: 0.68 mg/dL (ref 0.61–1.24)
GFR, Estimated: >60 mL/min (ref >60)
Glucose, Bld: 88 mg/dL (ref 70–99)
Potassium: 4.1 mmol/L (ref 3.5–5.1)
Sodium: 133 mmol/L — ABNORMAL LOW (ref 135–145)

## 2022-09-11 MED ORDER — GUAIFENESIN-DM 100-10 MG/5ML PO SYRP: 5.0000 mL | ORAL_SOLUTION | ORAL | 0 refills | Status: DC | PRN

## 2022-09-11 MED ORDER — GUAIFENESIN-DM 100-10 MG/5ML PO SYRP
5.0000 mL | ORAL_SOLUTION | ORAL | Status: DC | PRN
  Administered 2022-09-11: 5 mL via ORAL
  Filled 2022-09-11: qty 5

## 2022-09-11 MED ORDER — CEFDINIR 300 MG PO CAPS: 600.0000 mg | ORAL_CAPSULE | Freq: Every day | ORAL | 0 refills | Status: AC

## 2022-09-11 MED ORDER — OXYCODONE HCL 5 MG PO TABS: 5.0000 mg | ORAL_TABLET | Freq: Three times a day (TID) | ORAL | 0 refills | Status: DC | PRN

## 2022-09-11 NOTE — Progress Notes (Signed)
Wife is wanting a call from the doctor this am.   Devonne Doughty [facility]

## 2022-09-11 NOTE — Progress Notes (Signed)
Mobility Specialist Progress Note    09/11/22 1147  Mobility  Activity Ambulated with assistance in hallway  Level of Assistance Minimal assist, patient does 75% or more  Assistive Device Front wheel walker  Distance Ambulated (ft) 130 ft  Activity Response Tolerated well  Mobility Referral Yes  $Mobility charge 1 Mobility  Mobility Specialist Start Time (ACUTE ONLY) 1134  Mobility Specialist Stop Time (ACUTE ONLY) 1146  Mobility Specialist Time Calculation (min) (ACUTE ONLY) 12 min   Pre-Mobility: 64 HR, 95% SpO2  Pt received in bed and agreeable. No complaints. Returned to bathroom to attempt BM. Encouraged to pull string when ready to get up. Wife present and RN aware.   [place] Mobility Specialist  Please Neurosurgeon or Rehab Office at [phone]

## 2022-09-11 NOTE — Progress Notes (Signed)
Pt keeps coughing and asking for something for it. RN messaged MD on call.   Devonne Doughty Desta Bujak

## 2022-09-11 NOTE — Discharge Summary (Signed)
Physician Discharge Summary   Patient: Samuel Little MRN: [id number] DOB: 02-05-1933  Admit date:     09/09/2022  Discharge date: 09/11/22  Discharge Physician: Jonah Blue   PCP: Georgann Housekeeper, MD   Recommendations at discharge:   Start Cefdinir tomorrow and take until gone Follow up with PCP in the next 1-2 weeks Take Tylenol/Robitussin as needed for pain/cough, Oxycodone as needed for breakthrough pain  Discharge Diagnoses: Principal Problem:   CAP (community acquired pneumonia) Active Problems:   Hypothyroidism   Essential hypertension   Gout   Pleuritic chest pain   Normocytic anemia   Parapneumonic effusion   Hospital Course: 89yo male with h/o HTN, hypothyroidism, and SVT s/p ablation (2010) who presented on 6/28 with CP/SOB.  He was found to have LLL PNA with effusion and underwent thoracentesis, draining 450 cc of hazy amber fluid which was c/w exudative effusion.  Assessment and Plan:   LLL CAP with effusion -Patient presenting with CP/SOB -Found to have infiltrate in left lower lobe on chest x-ray with effusion -Thoracentesis performed with fluid c/w exudate -This appears to be most likely community-acquired pneumonia.  -COVID-19 negative. -Blood and pleural fluid cultures are pending, NTD -Legionella and Strep pneumo testing is pending -Treated with Azithromycin 500 mg IV and Rocephin x 3 days -> complete course of 7 total days with Cefdinir -Additional complicating factors include: associated pleural effusion, s/p thoracentesis -Will send home with a limited number of oxycodone for him to take as needed for severe pain  Pleuritic chest pain -Due to effusion -s/p thoracentesis -Echo completed on 6/29 with preserved EF, grade 1 diastolic dysfunction   Anemia -Normocytic anemia -Hgb dropped 2 grams from admission, currently stable   Gouty flare up -Completed prednisone taper -No further complaints   HTN, uncontrolled -Continue losartan    Hypothyroidism -Normal TSH -Continue Synthroid   Pain control -  Controlled Substance Reporting System database was reviewed. and patient was instructed, not to drive, operate heavy machinery, perform activities at heights, swimming or participation in water activities or provide baby-sitting services while on Pain, Sleep and Anxiety Medications; until their outpatient Physician has advised to do so again. Also recommended to not to take more than prescribed Pain, Sleep and Anxiety Medications.   Consultants: IR; PT Procedures performed: Echocardiogram  Disposition: Home Diet recommendation:  Regular diet DISCHARGE MEDICATION: Allergies as of 09/11/2022       Reactions   Nsaids Other (See Comments)   JAUNDICE   Tolmetin Other (See Comments)   jaundice   Celecoxib Nausea And Vomiting   Meperidine Hcl Nausea Only        Medication List     STOP taking these medications    predniSONE 5 MG (21) Tbpk tablet Commonly known as: STERAPRED UNI-PAK 21 TAB       TAKE these medications    acetaminophen 650 MG CR tablet Commonly known as: TYLENOL Take 1,300 mg every 8 (eight) hours as needed by mouth for pain.   allopurinol 100 MG tablet Commonly known as: ZYLOPRIM Take 100 mg by mouth daily.   Alpha-Lipoic Acid 100 MG Caps Take 1 capsule by mouth daily.   ASPERCREME EX Apply 1 application as needed topically (for muscle pain).   Bilberry 100 MG Caps Take 100 mg daily by mouth.   bisacodyl 5 MG EC tablet Commonly known as: DULCOLAX Take 10 mg daily as needed by mouth for moderate constipation.   CALCIUM PO Take 1 tablet by mouth daily.  cefdinir 300 MG capsule Commonly known as: OMNICEF Take 2 capsules (600 mg total) by mouth daily for 4 days. Start taking on: September 12, 2022   cholecalciferol 1000 units tablet Commonly known as: VITAMIN D Take 1,000 Units daily by mouth.   CO Q 10 PO Take 120 mg daily by mouth.   colchicine 0.6 MG  tablet Take 1 tablet (0.6 mg total) by mouth daily as needed (gout). Please take daily for next 5 days until gout flare is resolved, then as needed What changed:  reasons to take this additional instructions   CRANBERRY PO Take 500 mg daily by mouth.   doxazosin 4 MG tablet Commonly known as: CARDURA Take 4 mg by mouth at bedtime.   Fish Oil 1000 MG Caps Take 1,000 mg daily by mouth.   FLAX SEED OIL PO Take 1 tablet by mouth daily.   GINKGO BILOBA PO Take 120 mg daily by mouth.   GLUCOSAMINE CHONDROITIN JOINT PO Take 2 tablets daily by mouth.   guaiFENesin-dextromethorphan 100-10 MG/5ML syrup Commonly known as: ROBITUSSIN DM Take 5 mLs by mouth every 4 (four) hours as needed for cough.   levothyroxine 175 MCG tablet Commonly known as: SYNTHROID Take 175 mcg by mouth daily before breakfast.   lisinopril 20 MG tablet Commonly known as: ZESTRIL Take 1 tablet (20 mg total) by mouth daily.   Lutein 6 MG Caps Take 6 mg daily by mouth.   OVER THE COUNTER MEDICATION Take 2 tablets 2 (two) times daily by mouth. Macular Protect Complete Supplement   oxyCODONE 5 MG immediate release tablet Commonly known as: Roxicodone Take 1 tablet (5 mg total) by mouth every 8 (eight) hours as needed.   polyethylene glycol 17 g packet Commonly known as: MIRALAX / GLYCOLAX Take 17 g daily as needed by mouth for moderate constipation.   RESVERATROL PO Take 1 tablet by mouth daily.   senna 8.6 MG Tabs tablet Commonly known as: SENOKOT Take 1 tablet (8.6 mg total) by mouth at bedtime as needed for mild constipation. What changed: when to take this   TART CHERRY PO Take 1 tablet by mouth daily.   Turmeric 500 MG Caps Take 500 mg 2 (two) times daily by mouth.   VITAMIN B-12 PO Take 1 tablet daily by mouth.   Vitamin C 500 MG Caps Take 500 mg by mouth daily.        Discharge Exam: Filed Weights   09/09/22 0826  Weight: 87.1 kg   Vitals:   09/11/22 0400 09/11/22 0905   BP: (!) 142/65 (!) 145/74  Pulse: (!) 57 71  Resp: 17 17  Temp: (!) 97.5 F (36.4 C) 98.4 F (36.9 C)  SpO2: 93% 92%    General:  Appears calm and comfortable and is in NAD Eyes:  EOMI, normal lids, iris ENT:  Hard of hearing, grossly normal lips & tongue, mmm Neck:  no LAD, masses or thyromegaly Cardiovascular:  RRR, no m/r/g. No LE edema.  Mild L pleuritic CP on left upper back Respiratory:   CTA bilaterally with no wheezes/rales/rhonchi.  Normal respiratory effort on room air Abdomen:  soft, NT, ND Skin:  no rash or induration seen on limited exam Musculoskeletal:  grossly normal tone BUE/BLE, good ROM, no bony abnormality Psychiatric:  grossly normal mood and affect, speech fluent and appropriate, AOx3 Neurologic:  CN 2-12 grossly intact, moves all extremities in coordinated fashion   Pertinent labs:    Unremarkable BMP Stable CBC    Condition  at discharge: stable  The results of significant diagnostics from this hospitalization (including imaging, microbiology, ancillary and laboratory) are listed below for reference.   Imaging Studies: ECHOCARDIOGRAM COMPLETE  Result Date: 09/10/2022    ECHOCARDIOGRAM REPORT   Patient Name:   ERIVERTO LAZUR Date of Exam: 09/10/2022 Medical Rec #:  [id number]      Height:       73.0 in Accession #:    [id number]     Weight:       192.0 lb Date of Birth:  [birth year]      BSA:          2.115 m Patient Age:    89 years       BP:           139/60 mmHg Patient Gender: M              HR:           68 bpm. Exam Location:  Inpatient Procedure: 2D Echo, Cardiac Doppler and Color Doppler Indications:    Pericardial effusion I31.3  History:        Patient has no prior history of Echocardiogram examinations.                 CAP, Arrythmias:RBBB; Risk Factors:Hypertension and Non-Smoker.  Sonographer:    Aron Baba Referring Phys: [id number] Emeline General  Sonographer Comments: Image acquisition challenging due to respiratory motion. IMPRESSIONS  1. Left  ventricular ejection fraction, by estimation, is 60 to 65%. The left ventricle has normal function. The left ventricle has no regional wall motion abnormalities. Left ventricular diastolic parameters are consistent with Grade I diastolic dysfunction (impaired relaxation).  2. Right ventricular systolic function is normal. The right ventricular size is mildly enlarged. There is normal pulmonary artery systolic pressure.  3. Left atrial size was mildly dilated.  4. A small pericardial effusion is present. The pericardial effusion is lateral to the left ventricle. Moderate pleural effusion in the left lateral region.  5. The mitral valve is normal in structure. Trivial mitral valve regurgitation. No evidence of mitral stenosis.  6. The aortic valve is tricuspid. There is severe calcifcation of the aortic valve. Aortic valve regurgitation is mild. No aortic stenosis is present.  7. Aortic dilatation noted. There is moderate dilatation of the aortic root, measuring 46 mm. There is moderate dilatation of the ascending aorta, measuring 45 mm.  8. The inferior vena cava is normal in size with greater than 50% respiratory variability, suggesting right atrial pressure of 3 mmHg. Comparison(s): No prior Echocardiogram. FINDINGS  Left Ventricle: Left ventricular ejection fraction, by estimation, is 60 to 65%. The left ventricle has normal function. The left ventricle has no regional wall motion abnormalities. The left ventricular internal cavity size was normal in size. There is  no left ventricular hypertrophy. Left ventricular diastolic parameters are consistent with Grade I diastolic dysfunction (impaired relaxation). Right Ventricle: The right ventricular size is mildly enlarged. No increase in right ventricular wall thickness. Right ventricular systolic function is normal. There is normal pulmonary artery systolic pressure. The tricuspid regurgitant velocity is 1.94  m/s, and with an assumed right atrial pressure of 8  mmHg, the estimated right ventricular systolic pressure is 23.1 mmHg. Left Atrium: Left atrial size was mildly dilated. Right Atrium: Right atrial size was normal in size. Pericardium: A small pericardial effusion is present. The pericardial effusion is lateral to the left ventricle. Mitral Valve: The mitral valve is normal in structure. Mild  mitral annular calcification. Trivial mitral valve regurgitation. No evidence of mitral valve stenosis. Tricuspid Valve: The tricuspid valve is normal in structure. Tricuspid valve regurgitation is trivial. No evidence of tricuspid stenosis. Aortic Valve: The aortic valve is tricuspid. There is severe calcifcation of the aortic valve. Aortic valve regurgitation is mild. Aortic regurgitation PHT measures 1239 msec. No aortic stenosis is present. Pulmonic Valve: The pulmonic valve was not well visualized. Pulmonic valve regurgitation is trivial. No evidence of pulmonic stenosis. Aorta: Aortic dilatation noted. There is moderate dilatation of the aortic root, measuring 46 mm. There is moderate dilatation of the ascending aorta, measuring 45 mm. Venous: The inferior vena cava is normal in size with greater than 50% respiratory variability, suggesting right atrial pressure of 3 mmHg. IAS/Shunts: No atrial level shunt detected by color flow Doppler. Additional Comments: There is a moderate pleural effusion in the left lateral region.  LEFT VENTRICLE PLAX 2D LVIDd:         4.90 cm   Diastology LVIDs:         3.30 cm   LV e' medial:    7.38 cm/s LV PW:         1.10 cm   LV E/e' medial:  10.6 LV IVS:        1.10 cm   LV e' lateral:   6.41 cm/s LVOT diam:     2.30 cm   LV E/e' lateral: 12.2 LV SV:         121 LV SV Index:   57 LVOT Area:     4.15 cm  RIGHT VENTRICLE RV S prime:     11.60 cm/s TAPSE (M-mode): 1.6 cm LEFT ATRIUM           Index        RIGHT ATRIUM           Index LA diam:      3.70 cm 1.75 cm/m   RA Area:     12.80 cm LA Vol (A2C): 83.5 ml 39.49 ml/m  RA Volume:    27.60 ml  13.05 ml/m LA Vol (A4C): 61.6 ml 29.13 ml/m  AORTIC VALVE LVOT Vmax:   131.00 cm/s LVOT Vmean:  87.400 cm/s LVOT VTI:    0.291 m AI PHT:      1239 msec  AORTA Ao Root diam: 4.60 cm Ao Asc diam:  4.40 cm MITRAL VALVE               TRICUSPID VALVE MV Area (PHT): 2.56 cm    TR Peak grad:   15.1 mmHg MV Decel Time: 296 msec    TR Vmax:        194.00 cm/s MR Peak grad: 6.7 mmHg MR Vmax:      129.50 cm/s  SHUNTS MV E velocity: 78.00 cm/s  Systemic VTI:  0.29 m MV A velocity: 66.30 cm/s  Systemic Diam: 2.30 cm MV E/A ratio:  1.18 Vishnu Priya Mallipeddi Electronically signed by Winfield Rast Mallipeddi Signature Date/Time: 09/10/2022/11:59:59 AM    Final    DG Chest 1 View  Result Date: 09/09/2022 CLINICAL DATA:  [id number] Pleural effusion on left [id number] EXAM: CHEST  1 VIEW COMPARISON:  Chest x-ray 09/09/2022, CT chest 09/09/2022 FINDINGS: The heart and mediastinal contours are unchanged. Aortic calcification. Persistent left base airspace opacity. No pulmonary edema. Persistent at least small volume left pleural effusion. No right pleural effusion. No pneumothorax. No acute osseous abnormality. IMPRESSION: 1. Persistent at least small volume left pleural effusion with  associated airspace opacity at the left base. 2.  Aortic Atherosclerosis (ICD10-I70.0). Electronically Signed   By: Tish Frederickson M.D.   On: 09/09/2022 17:45   IR THORACENTESIS ASP PLEURAL SPACE W/IMG GUIDE  Result Date: 09/09/2022 INDICATION: 89 year old male presents with shortness of breath. Previous imaging showed left pleural effusion. Request for therapeutic and diagnostic thoracentesis EXAM: ULTRASOUND GUIDED LEFT THORACENTESIS MEDICATIONS: 10 mL 1% lidocaine COMPLICATIONS: None immediate. PROCEDURE: An ultrasound guided thoracentesis was thoroughly discussed with the patient and questions answered. The benefits, risks, alternatives and complications were also discussed. The patient understands and wishes to proceed with the  procedure. Written consent was obtained. Ultrasound was performed to localize and mark an adequate pocket of fluid in the left chest. The area was then prepped and draped in the normal sterile fashion. 1% Lidocaine was used for local anesthesia. Under ultrasound guidance a 6 Fr Safe-T-Centesis catheter was introduced. Thoracentesis was performed. The catheter was removed and a dressing applied. FINDINGS: A total of approximately 450 mL of hazy fluid was removed. Samples were sent to the laboratory as requested by the clinical team. Post-procedural CXR ordered, results pending IMPRESSION: Successful ultrasound guided left thoracentesis yielding 450 mL of pleural fluid. Performed by: Lawernce Ion, PA-C Electronically Signed   By: Gilmer Mor D.O.   On: 09/09/2022 17:00   CT CHEST WO CONTRAST  Result Date: 09/09/2022 CLINICAL DATA:  Suspect pneumonia. EXAM: CT CHEST WITHOUT CONTRAST TECHNIQUE: Multidetector CT imaging of the chest was performed following the standard protocol without IV contrast. RADIATION DOSE REDUCTION: This exam was performed according to the departmental dose-optimization program which includes automated exposure control, adjustment of the mA and/or kV according to patient size and/or use of iterative reconstruction technique. COMPARISON:  02/07/2017 FINDINGS: Cardiovascular: Mild cardiomegaly. Small amount of pericardial fluid. Calcified plaque over the left main and 3 vessel coronary arteries. Stable 4.3 cm thoracic aortic aneurysm involving the proximal ascending aorta. Calcified plaque throughout the thoracic aorta. Remaining vascular structures are unchanged. Mediastinum/Nodes: No significant mediastinal or hilar adenopathy on this noncontrast exam. Remaining mediastinal structures are unremarkable. Lungs/Pleura: There is a moderate size left pleural effusion with associated compressive small amount of fluid loculated within the inferior as well as superior aspect of the left major fissure.  Minimal minimal peripheral interstitial prominence over the posterior right lower lobe and lingula. Calcified granuloma over the left upper lobe. Airways are unremarkable. Upper Abdomen: Prior cholecystectomy. Calcified plaque over the abdominal aorta. Bilateral nephrolithiasis unchanged. 1.8 cm cyst over the mid pole right kidney with Hounsfield unit measurements 9.8 compatible with Bosniak 1 cyst. No further imaging follow-up recommended. Diverticulosis of the colon. Few nonspecific subcentimeter lymph nodes over the gastrohepatic ligament and periaortic region. Musculoskeletal: Degenerative changes. IMPRESSION: 1. Moderate size left pleural effusion with associated compressive atelectasis. 2. Mild cardiomegaly with small amount of pericardial fluid. Atherosclerotic coronary artery disease. 3. Stable 4.3 cm thoracic aortic aneurysm involving the proximal ascending aorta. Recommend annual imaging followup by CTA or MRA. This recommendation follows 2010 ACCF/AHA/AATS/ACR/ASA/SCA/SCAI/SIR/STS/SVM Guidelines for the Diagnosis and Management of Patients with Thoracic Aortic Disease. Circulation. 2010; 121: Z610-R604. Aortic aneurysm NOS (ICD10-I71.9). 4. Bilateral nephrolithiasis. 1.8 cm right renal cyst. No further imaging follow-up recommended. 5. Diverticulosis of the colon. 6. Aortic atherosclerosis. Aortic Atherosclerosis (ICD10-I70.0). Electronically Signed   By: Elberta Fortis M.D.   On: 09/09/2022 12:25   DG Chest Portable 1 View  Result Date: 09/09/2022 CLINICAL DATA:  Fever and chest pain. EXAM: PORTABLE CHEST 1 VIEW COMPARISON:  Chest  x-ray June 24, 2016. FINDINGS: Left basilar airspace opacities. No definite sizable pleural effusion. No visible pneumothorax. Cardiomediastinal silhouette is unchanged. Polyarticular degenerative change. IMPRESSION: Left basilar airspace opacities, suspicious for pneumonia. Electronically Signed   By: Feliberto Harts M.D.   On: 09/09/2022 09:01     Microbiology: Results for orders placed or performed during the hospital encounter of 09/09/22  SARS Coronavirus 2 by RT PCR (hospital order, performed in [facility] hospital lab) *cepheid single result test* Peripheral     Status: None   Collection Time: 09/09/22  8:42 AM   Specimen: Peripheral; Nasal Swab  Result Value Ref Range Status   SARS Coronavirus 2 by RT PCR NEGATIVE NEGATIVE Final    Comment: Performed at [facility], [address], [place], [place] [address]  Blood culture (routine x 2)     Status: None (Preliminary result)   Collection Time: 09/09/22  8:43 AM   Specimen: BLOOD  Result Value Ref Range Status   Specimen Description BLOOD RIGHT ANTECUBITAL  Final   Special Requests   Final    BOTTLES DRAWN AEROBIC AND ANAEROBIC Blood Culture results may not be optimal due to an excessive volume of blood received in culture bottles   Culture   Final    NO GROWTH 2 DAYS Performed at [facility], [address] [place], [place] [address]    Report Status PENDING  Incomplete  Blood culture (routine x 2)     Status: None (Preliminary result)   Collection Time: 09/09/22  2:25 PM   Specimen: BLOOD RIGHT ARM  Result Value Ref Range Status   Specimen Description BLOOD RIGHT ARM  Final   Special Requests   Final    BOTTLES DRAWN AEROBIC AND ANAEROBIC Blood Culture adequate volume   Culture   Final    NO GROWTH 2 DAYS Performed at [facility], [address], [place], [place] [address]    Report Status PENDING  Incomplete  Body fluid culture w Gram Stain     Status: None (Preliminary result)   Collection Time: 09/09/22  4:17 PM   Specimen: Lung, Left; Pleural Fluid  Result Value Ref Range Status   Specimen Description PLEURAL  Final   Special Requests LEFT  Final   Gram Stain   Final    ABUNDANT WBC PRESENT, PREDOMINANTLY PMN NO ORGANISMS SEEN    Culture   Final    NO GROWTH < 24 HOURS Performed at [facility], [address],  [place], [place] [address]    Report Status PENDING  Incomplete      Discharge time spent: greater than 30 minutes.  Signed: Jonah Blue, MD [facility] 09/11/2022

## 2022-09-12 LAB — BODY FLUID CULTURE W GRAM STAIN

## 2022-09-13 LAB — CULTURE, BLOOD (ROUTINE X 2)

## 2022-09-13 LAB — PATHOLOGIST SMEAR REVIEW: Path Review: NEGATIVE

## 2022-09-14 LAB — CULTURE, BLOOD (ROUTINE X 2): Culture: NO GROWTH

## 2022-09-15 LAB — MISC LABCORP TEST (SEND OUT)

## 2022-09-21 LAB — MISC LABCORP TEST (SEND OUT): Labcorp test code: 9985

## 2022-09-28 ENCOUNTER — Other Ambulatory Visit: Payer: Self-pay | Admitting: Internal Medicine

## 2022-09-28 ENCOUNTER — Ambulatory Visit
Admission: RE | Admit: 2022-09-28 | Discharge: 2022-09-28 | Disposition: A | Discharge status: Home / Self Care | Payer: Medicare Other | Source: Ambulatory Visit | Attending: Internal Medicine | Admitting: Internal Medicine

## 2022-09-28 DIAGNOSIS — J189 Pneumonia, unspecified organism: Secondary | ICD-10-CM

## 2022-10-19 ENCOUNTER — Encounter: Payer: Medicare Other | Admitting: Internal Medicine

## 2022-10-19 ENCOUNTER — Other Ambulatory Visit: Payer: Medicare Other

## 2022-10-20 NOTE — Progress Notes (Unsigned)
[facility] Telephone:([phone]) [phone]   Fax:([phone]) [phone]  CONSULT NOTE  REFERRING PHYSICIAN:  Dr. Donette Larry   REASON FOR CONSULTATION:  Pancytopenia   HPI Samuel Little is a 89 y.o. male with a past medical history significant for hypothyroidism, HLD, GERD, HTN, SVT, DDD, OA, nephrolithiasis, neuropathy, enlarged prostate, atherosclerosis, BVP, CKD, prediabetes, cholecystectomy is referred to the clinic for pancytopenia.   The patient recently had a hospital follow up visit with his PCP on 09/29/22. The patient was admitted to the hospital from 6/28-6/30 for LLL pneumonia and left pleural effusion.   His labs showed pancytopenia with a WBC of 2.3, anemia with Hbg of 8.7, high MCV at 94.1, and low platelet count of *** His iron studies showed low TIBC of 234, low iron at 28, low iron saturation at 12, and low transferrin at 167. His B12 was elevated at >1501. His last labs at his PCPs office were from 07/07/22 in which case his WBC was 3.2, Hbg 11.7, and PLT of 110. His Hbg upon hospital admission was 10.8.   Since being discharged, the patient is feeling ***  The patient was referred to the clinic for further evaluation and recommendations regarding this finding.    He is not sure how long she has had anemia. Per chart review, this occurred in 2011. The lowest Hbg I see is 8.7 recently at his PCPs office. He had periods of mild anemia up and down in 2014. She denies any known bleeding or bruising including epistaxis, hemoptysis, hematemesis, melena, or hematochezia. He states it has been several years since she had a colonoscopy under the care of Dr. Marland Kitchen  He states that her colonoscopy was normal***. The patient denies ever needing an iron or blood transfusion.  Does known vitamin deficiencies. Taking iron or B12?  Overall he denies&*** any fatigue, shortness of breath, or chest pain.  She sometimes has some lightheadedness in the morning.  He denies any NSAID use or blood  thinners. Denies any history of any stomach ulcers.  The patient denies frequent blood donation.  Denies recent pica.   Regarding the leukocytopenia, is not sure how long she has had this/ The oldest records I have are from 2011. In general, his WBC was normal during that time except for the last month or so. Denies any recent or frequent infections except for his recent pneumonia. Denies any other frequent or recent viral infections including hepatitis, mono, or HIV.  Denies any alcohol use. Denies any nausea, vomiting, diarrhea, or constipation.  Denies any fever, chills, night sweats, or unexplained weight loss. He takes several herbal supplements including bilberry, cranberry, ginko, tart cherry, etc ***  Denies any personal history of autoimmune disorders.   Regarding the thrombocytopenia, the patient denies any bleeding complications from surgery.  She has had some slightly low blood counts since 2011 intermittently. The lowest platelet count is see is 102k .  Denies any aspirin use.  As previously mentioned, she may be taking Plavix.  Denies any immunosuppressive drugs.  Denies any liver disease.  She had a CT of the abdomen pelvis performed in January 2023 which did not show any evidence of cirrhosis or liver disease.    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 , march 2,2011    Arthritis    Diverticulosis    Effusion, pericardium    Gastroesophageal reflux disease  Gout    Hard of hearing    wears bilateral hearing aids   History of kidney stones    Hypertension    Hypothyroidism    Macular degeneration    Pleural effusion, left    Right bundle branch block    Thyroid disease    hypothyroidism   Wears glasses     Past Surgical History:  Procedure Laterality Date   ARTHROSCOPY KNEE W/ DRILLING Right    CARDIAC CATHETERIZATION  2007   CARDIAC ELECTROPHYSIOLOGY  STUDY AND ABLATION     CATARACT EXTRACTION     CHEST TUBE INSERTION     CHOLECYSTECTOMY  06/26/2016   Procedure: LAPAROSCOPIC CHOLECYSTECTOMY  subtotal;  Surgeon: Almond Lint, MD;  Location: [facility] OR;  Service: General;;   FRACTURE SURGERY Right 2001   right leg   HERNIA REPAIR     IR THORACENTESIS ASP PLEURAL SPACE W/IMG GUIDE  09/09/2022   MASS EXCISION Right 09/22/2015   Procedure: RIGHT THUMB EXCISION MASS;  Surgeon: Betha Loa, MD;  Location: [facility];  Service: Orthopedics;  Laterality: Right;   ROTATOR CUFF REPAIR     ROTATOR CUFF REPAIR Right 2002   TONSILLECTOMY     TOTAL KNEE ARTHROPLASTY Right 02/13/2017   Procedure: TOTAL KNEE ARTHROPLASTY;  Surgeon: Dannielle Huh, MD;  Location: [facility] OR;  Service: Orthopedics;  Laterality: Right;    Family History  Problem Relation Age of Onset   Hypertension Mother    Diabetes Mother    Hypertension Father    Heart attack Father    Diabetes Father    Heart attack Brother    Neuropathy Brother    Heart attack Paternal Uncle    Heart attack Paternal Uncle    Heart attack Paternal Uncle    Other Son        Leak in aorta    Social History Social History   Tobacco Use   Smoking status: Never   Smokeless tobacco: Never  Substance Use Topics   Alcohol use: No   Drug use: No    Allergies  Allergen Reactions   Nsaids Other (See Comments)    JAUNDICE   Tolmetin Other (See Comments)    jaundice   Celecoxib Nausea And Vomiting   Meperidine Hcl Nausea Only    Current Outpatient Medications  Medication Sig Dispense Refill   acetaminophen (TYLENOL) 650 MG CR tablet Take 1,300 mg every 8 (eight) hours as needed by mouth for pain.      allopurinol (ZYLOPRIM) 100 MG tablet Take 100 mg by mouth daily.     Alpha-Lipoic Acid 100 MG CAPS Take 1 capsule by mouth daily.     Ascorbic Acid (VITAMIN C) 500 MG CAPS Take 500 mg by mouth daily.      Bilberry 100 MG CAPS Take 100 mg daily by mouth.      bisacodyl (DULCOLAX) 5 MG  EC tablet Take 10 mg daily as needed by mouth for moderate constipation.     CALCIUM PO Take 1 tablet by mouth daily.     cholecalciferol (VITAMIN D) 1000 units tablet Take 1,000 Units daily by mouth.      Coenzyme Q10 (CO Q 10 PO) Take 120 mg daily by mouth.      colchicine 0.6 MG tablet Take 1 tablet (0.6 mg total) by mouth daily as needed (gout). Please take daily for next 5 days until gout flare is resolved, then as needed (Patient taking differently: Take 0.6 mg by mouth daily  as needed (for gout).) 30 tablet 1   CRANBERRY PO Take 500 mg daily by mouth.      Cyanocobalamin (VITAMIN B-12 PO) Take 1 tablet daily by mouth.     doxazosin (CARDURA) 4 MG tablet Take 4 mg by mouth at bedtime.      Flaxseed, Linseed, (FLAX SEED OIL PO) Take 1 tablet by mouth daily.     GINKGO BILOBA PO Take 120 mg daily by mouth.      Glucos-Chondroit-Hyaluron-MSM (GLUCOSAMINE CHONDROITIN JOINT PO) Take 2 tablets daily by mouth.      guaiFENesin-dextromethorphan (ROBITUSSIN DM) 100-10 MG/5ML syrup Take 5 mLs by mouth every 4 (four) hours as needed for cough. 118 mL 0   levothyroxine (SYNTHROID, LEVOTHROID) 175 MCG tablet Take 175 mcg by mouth daily before breakfast.     lisinopril (PRINIVIL,ZESTRIL) 20 MG tablet Take 1 tablet (20 mg total) by mouth daily. 30 tablet 3   Lutein 6 MG CAPS Take 6 mg daily by mouth.     Omega-3 Fatty Acids (FISH OIL) 1000 MG CAPS Take 1,000 mg daily by mouth.     OVER THE COUNTER MEDICATION Take 2 tablets 2 (two) times daily by mouth. Macular Protect Complete Supplement     oxyCODONE (ROXICODONE) 5 MG immediate release tablet Take 1 tablet (5 mg total) by mouth every 8 (eight) hours as needed. 10 tablet 0   polyethylene glycol (MIRALAX / GLYCOLAX) packet Take 17 g daily as needed by mouth for moderate constipation.     RESVERATROL PO Take 1 tablet by mouth daily.      senna (SENOKOT) 8.6 MG TABS tablet Take 1 tablet (8.6 mg total) by mouth at bedtime as needed for mild constipation.  (Patient taking differently: Take 1 tablet by mouth daily as needed for mild constipation.) 10 each 0   TART CHERRY PO Take 1 tablet by mouth daily.     Trolamine Salicylate (ASPERCREME EX) Apply 1 application as needed topically (for muscle pain).     Turmeric 500 MG CAPS Take 500 mg 2 (two) times daily by mouth.      No current facility-administered medications for this visit.    REVIEW OF SYSTEMS:   Review of Systems  Constitutional: Negative for appetite change, chills, fatigue, fever and unexpected weight change.  HENT:   Negative for mouth sores, nosebleeds, sore throat and trouble swallowing.   Eyes: Negative for eye problems and icterus.  Respiratory: Negative for cough, hemoptysis, shortness of breath and wheezing.   Cardiovascular: Negative for chest pain and leg swelling.  Gastrointestinal: Negative for abdominal pain, constipation, diarrhea, nausea and vomiting.  Genitourinary: Negative for bladder incontinence, difficulty urinating, dysuria, frequency and hematuria.   Musculoskeletal: Negative for back pain, gait problem, neck pain and neck stiffness.  Skin: Negative for itching and rash.  Neurological: Negative for dizziness, extremity weakness, gait problem, headaches, light-headedness and seizures.  Hematological: Negative for adenopathy. Does not bruise/bleed easily.  Psychiatric/Behavioral: Negative for confusion, depression and sleep disturbance. The patient is not nervous/anxious.     PHYSICAL EXAMINATION:  There were no vitals taken for this visit.  ECOG PERFORMANCE STATUS: {CHL ONC ECOG [id number]  Physical Exam  Constitutional: Oriented to person, place, and time and well-developed, well-nourished, and in no distress. No distress.  HENT:  Head: Normocephalic and atraumatic.  Mouth/Throat: Oropharynx is clear and moist. No oropharyngeal exudate.  Eyes: Conjunctivae are normal. Right eye exhibits no discharge. Left eye exhibits no discharge. No scleral  icterus.  Neck: Normal range  of motion. Neck supple.  Cardiovascular: Normal rate, regular rhythm, normal heart sounds and intact distal pulses.   Pulmonary/Chest: Effort normal and breath sounds normal. No respiratory distress. No wheezes. No rales.  Abdominal: Soft. Bowel sounds are normal. Exhibits no distension and no mass. There is no tenderness.  Musculoskeletal: Normal range of motion. Exhibits no edema.  Lymphadenopathy:    No cervical adenopathy.  Neurological: Alert and oriented to person, place, and time. Exhibits normal muscle tone. Gait normal. Coordination normal.  Skin: Skin is warm and dry. No rash noted. Not diaphoretic. No erythema. No pallor.  Psychiatric: Mood, memory and judgment normal.  Vitals reviewed.  LABORATORY DATA: Lab Results  Component Value Date   WBC 5.3 09/11/2022   HGB 9.3 (L) 09/11/2022   HCT 28.1 (L) 09/11/2022   MCV 95.9 09/11/2022   PLT 141 (L) 09/11/2022      Chemistry      Component Value Date/Time   NA 133 (L) 09/11/2022 0414   K 4.1 09/11/2022 0414   CL 103 09/11/2022 0414   CO2 23 09/11/2022 0414   BUN 18 09/11/2022 0414   CREATININE 0.68 09/11/2022 0414      Component Value Date/Time   CALCIUM 8.3 (L) 09/11/2022 0414   ALKPHOS 339 (H) 09/09/2022 0842   AST 72 (H) 09/09/2022 0842   ALT 69 (H) 09/09/2022 0842   BILITOT 0.8 09/09/2022 0842       RADIOGRAPHIC STUDIES: DG Chest 2 View  Result Date: 09/28/2022 CLINICAL DATA:  Provided history: Pneumonia, organism unspecified. Pleural effusion. EXAM: CHEST - 2 VIEW COMPARISON:  Prior chest radiographs 09/09/2022 and earlier. Chest CT 09/09/2022. FINDINGS: The cardiomediastinal silhouette is unchanged. Aortic atherosclerosis. Small left pleural effusion, decreased in size from the prior examination of 09/09/2022. Associated fissural fluid on the left. Aeration of the left lung base has improved from the prior exam. However, there are persistent ill-defined opacities within the left  lung base which may reflect atelectasis and/or airspace consolidation. No appreciable airspace consolidation on the right. No evidence of pneumothorax. No acute osseous abnormality identified. Degenerative changes of the spine. IMPRESSION: 1. Small left pleural effusion, decreased in size from the prior examination of 09/09/2022. Associated fissural fluid on the left. 2. Although aeration of the left lung base has improved, there are persistent left basilar opacities which may reflect atelectasis and/or airspace consolidation. 3. Aortic Atherosclerosis (ICD10-I70.0). Electronically Signed   By: Jackey Loge D.O.   On: 09/28/2022 16:06    ASSESSMENT: This is a very pleasant 89 year old Caucasian male referred to clinic for pancytopenia  he patient was seen with Dr. Arbutus Ped today.  The patient had several lab studies performed including a CBC, CMP, LDH, hepatitis panel, HIV, SPEP with IFE. rheumatoid factor, ANA, iron, ferritin, vitamin B12, or folate.  The patient's labs from today demonstrate continued pancytopenia.  His total white blood cell count is slightly low at ***.  His hemoglobin is low at ***.  His platelet count is low at *** K.  The patient's CMP is fairly unremarkable, except for.  His iron studies ***,  His LDH is ***.  Her other labs are still pending at this time   The patient is taking several herbal supplements. The patient was advised to discontinue these and ***   Dr. Arbutus Ped recommends bone marrow biopsy and aspirate to rule out bone marrow pathology.    We will call the patient once I have all the results of her lab work today.  The patient voices understanding of current disease status and treatment options and is in agreement with the current care plan.  All questions were answered. The patient knows to call the clinic with any problems, questions or concerns. We can certainly see the patient much sooner if necessary.  Thank you so much for allowing me to participate in  the care of Samuel Little. I will continue to follow up the patient with you and assist in his care.  I spent {CHL ONC TIME VISIT - ZOXWR:[id number]} counseling the patient face to face. The total time spent in the appointment was {CHL ONC TIME VISIT - BJYNW:[id number]}.  Disclaimer: This note was dictated with voice recognition software. Similar sounding words can inadvertently be transcribed and may not be corrected upon review.    L  October 20, 2022, 1:23 PM

## 2022-10-24 ENCOUNTER — Inpatient Hospital Stay: Payer: Medicare Other

## 2022-10-24 ENCOUNTER — Other Ambulatory Visit: Payer: Self-pay

## 2022-10-24 ENCOUNTER — Inpatient Hospital Stay: Payer: Medicare Other | Attending: Internal Medicine | Admitting: Physician Assistant

## 2022-10-24 ENCOUNTER — Other Ambulatory Visit: Payer: Self-pay | Admitting: Physician Assistant

## 2022-10-24 VITALS — BP 143/58 | HR 66 | Temp 97.3°F | Resp 13 | Ht 69.0 in | Wt 182.6 lb

## 2022-10-24 DIAGNOSIS — I700 Atherosclerosis of aorta: Secondary | ICD-10-CM | POA: Insufficient documentation

## 2022-10-24 DIAGNOSIS — R5383 Other fatigue: Secondary | ICD-10-CM | POA: Insufficient documentation

## 2022-10-24 DIAGNOSIS — K5900 Constipation, unspecified: Secondary | ICD-10-CM | POA: Insufficient documentation

## 2022-10-24 DIAGNOSIS — Z79899 Other long term (current) drug therapy: Secondary | ICD-10-CM | POA: Insufficient documentation

## 2022-10-24 DIAGNOSIS — N400 Enlarged prostate without lower urinary tract symptoms: Secondary | ICD-10-CM | POA: Diagnosis not present

## 2022-10-24 DIAGNOSIS — J189 Pneumonia, unspecified organism: Secondary | ICD-10-CM | POA: Insufficient documentation

## 2022-10-24 DIAGNOSIS — M1990 Unspecified osteoarthritis, unspecified site: Secondary | ICD-10-CM | POA: Insufficient documentation

## 2022-10-24 DIAGNOSIS — K219 Gastro-esophageal reflux disease without esophagitis: Secondary | ICD-10-CM | POA: Diagnosis not present

## 2022-10-24 DIAGNOSIS — D61818 Other pancytopenia: Secondary | ICD-10-CM | POA: Insufficient documentation

## 2022-10-24 DIAGNOSIS — Z9049 Acquired absence of other specified parts of digestive tract: Secondary | ICD-10-CM | POA: Insufficient documentation

## 2022-10-24 DIAGNOSIS — Z79890 Hormone replacement therapy (postmenopausal): Secondary | ICD-10-CM | POA: Insufficient documentation

## 2022-10-24 DIAGNOSIS — D696 Thrombocytopenia, unspecified: Secondary | ICD-10-CM | POA: Diagnosis not present

## 2022-10-24 DIAGNOSIS — E785 Hyperlipidemia, unspecified: Secondary | ICD-10-CM | POA: Insufficient documentation

## 2022-10-24 DIAGNOSIS — L0390 Cellulitis, unspecified: Secondary | ICD-10-CM | POA: Diagnosis not present

## 2022-10-24 DIAGNOSIS — Z87442 Personal history of urinary calculi: Secondary | ICD-10-CM | POA: Diagnosis not present

## 2022-10-24 DIAGNOSIS — M109 Gout, unspecified: Secondary | ICD-10-CM | POA: Insufficient documentation

## 2022-10-24 DIAGNOSIS — E039 Hypothyroidism, unspecified: Secondary | ICD-10-CM | POA: Insufficient documentation

## 2022-10-24 DIAGNOSIS — I10 Essential (primary) hypertension: Secondary | ICD-10-CM | POA: Diagnosis not present

## 2022-10-24 LAB — CMP (CANCER CENTER ONLY)
ALT: 20 U/L (ref 0–44)
AST: 33 U/L (ref 15–41)
Albumin: 3.2 g/dL — ABNORMAL LOW (ref 3.5–5.0)
Alkaline Phosphatase: 226 U/L — ABNORMAL HIGH (ref 38–126)
Anion gap: 6 (ref 5–15)
BUN: 13 mg/dL (ref 8–23)
CO2: 26 mmol/L (ref 22–32)
Calcium: 8.8 mg/dL — ABNORMAL LOW (ref 8.9–10.3)
Chloride: 107 mmol/L (ref 98–111)
Creatinine: 0.76 mg/dL (ref 0.61–1.24)
GFR, Estimated: >60 mL/min (ref >60)
Glucose, Bld: 86 mg/dL (ref 70–99)
Potassium: 4 mmol/L (ref 3.5–5.1)
Sodium: 139 mmol/L (ref 135–145)
Total Bilirubin: 0.9 mg/dL (ref 0.3–1.2)
Total Protein: 6.5 g/dL (ref 6.5–8.1)

## 2022-10-24 LAB — IRON AND IRON BINDING CAPACITY (CC-WL,HP ONLY)
Iron: 72 ug/dL (ref 45–182)
Saturation Ratios: 25 % (ref 17.9–39.5)
TIBC: 287 ug/dL (ref 250–450)
UIBC: 215 ug/dL (ref 117–376)

## 2022-10-24 LAB — SAMPLE TO BLOOD BANK

## 2022-10-24 LAB — CBC WITH DIFFERENTIAL (CANCER CENTER ONLY)
Abs Immature Granulocytes: 0.02 10*3/uL (ref 0.00–0.07)
Basophils Absolute: 0 10*3/uL (ref 0.0–0.1)
Basophils Relative: 1 %
Eosinophils Absolute: 0.1 10*3/uL (ref 0.0–0.5)
Eosinophils Relative: 3 %
HCT: 28.8 % — ABNORMAL LOW (ref 39.0–52.0)
Hemoglobin: 9.8 g/dL — ABNORMAL LOW (ref 13.0–17.0)
Immature Granulocytes: 1 %
Lymphocytes Relative: 32 %
Lymphs Abs: 0.9 10*3/uL (ref 0.7–4.0)
MCH: 32.2 pg (ref 26.0–34.0)
MCHC: 34 g/dL (ref 30.0–36.0)
MCV: 94.7 fL (ref 80.0–100.0)
Monocytes Absolute: 0.4 10*3/uL (ref 0.1–1.0)
Monocytes Relative: 12 %
Neutro Abs: 1.5 10*3/uL — ABNORMAL LOW (ref 1.7–7.7)
Neutrophils Relative %: 51 %
Platelet Count: 88 10*3/uL — ABNORMAL LOW (ref 150–400)
RBC: 3.04 MIL/uL — ABNORMAL LOW (ref 4.22–5.81)
RDW: 19.6 % — ABNORMAL HIGH (ref 11.5–15.5)
WBC Count: 2.9 10*3/uL — ABNORMAL LOW (ref 4.0–10.5)
nRBC: 0 % (ref 0.0–0.2)

## 2022-10-24 LAB — LACTATE DEHYDROGENASE: LDH: 143 U/L (ref 98–192)

## 2022-10-24 LAB — VITAMIN B12: Vitamin B-12: 1255 pg/mL — ABNORMAL HIGH (ref 180–914)

## 2022-10-24 LAB — HEPATITIS PANEL, ACUTE
HCV Ab: NONREACTIVE
Hep A IgM: NONREACTIVE
Hep B C IgM: NONREACTIVE
Hepatitis B Surface Ag: NONREACTIVE

## 2022-10-24 LAB — HIV ANTIBODY (ROUTINE TESTING W REFLEX): HIV Screen 4th Generation wRfx: NONREACTIVE

## 2022-10-24 LAB — FERRITIN: Ferritin: 505 ng/mL — ABNORMAL HIGH (ref 24–336)

## 2022-10-24 LAB — FOLATE: Folate: 24.8 ng/mL (ref >5.9)

## 2022-10-27 ENCOUNTER — Other Ambulatory Visit: Payer: Self-pay | Admitting: Internal Medicine

## 2022-10-27 ENCOUNTER — Ambulatory Visit
Admission: RE | Admit: 2022-10-27 | Discharge: 2022-10-27 | Disposition: A | Discharge status: Home / Self Care | Payer: Medicare Other | Source: Ambulatory Visit | Attending: Internal Medicine | Admitting: Internal Medicine

## 2022-10-27 DIAGNOSIS — J90 Pleural effusion, not elsewhere classified: Secondary | ICD-10-CM

## 2022-11-02 ENCOUNTER — Emergency Department (HOSPITAL_COMMUNITY): Payer: Medicare Other

## 2022-11-02 ENCOUNTER — Emergency Department (HOSPITAL_COMMUNITY)
Admission: EM | Admit: 2022-11-02 | Discharge: 2022-11-02 | Disposition: A | Discharge status: Home / Self Care | Payer: Medicare Other | Attending: Emergency Medicine | Admitting: Emergency Medicine

## 2022-11-02 ENCOUNTER — Encounter (HOSPITAL_COMMUNITY): Payer: Self-pay | Admitting: Emergency Medicine

## 2022-11-02 DIAGNOSIS — Y92009 Unspecified place in unspecified non-institutional (private) residence as the place of occurrence of the external cause: Secondary | ICD-10-CM | POA: Diagnosis not present

## 2022-11-02 DIAGNOSIS — M25551 Pain in right hip: Secondary | ICD-10-CM | POA: Diagnosis not present

## 2022-11-02 DIAGNOSIS — N189 Chronic kidney disease, unspecified: Secondary | ICD-10-CM | POA: Diagnosis not present

## 2022-11-02 DIAGNOSIS — E039 Hypothyroidism, unspecified: Secondary | ICD-10-CM | POA: Diagnosis not present

## 2022-11-02 DIAGNOSIS — Z96651 Presence of right artificial knee joint: Secondary | ICD-10-CM | POA: Insufficient documentation

## 2022-11-02 DIAGNOSIS — Z79899 Other long term (current) drug therapy: Secondary | ICD-10-CM | POA: Insufficient documentation

## 2022-11-02 DIAGNOSIS — W19XXXA Unspecified fall, initial encounter: Secondary | ICD-10-CM | POA: Diagnosis not present

## 2022-11-02 DIAGNOSIS — I129 Hypertensive chronic kidney disease with stage 1 through stage 4 chronic kidney disease, or unspecified chronic kidney disease: Secondary | ICD-10-CM | POA: Insufficient documentation

## 2022-11-02 MED ORDER — LIDOCAINE 5 % EX PTCH: 1.0000 | MEDICATED_PATCH | CUTANEOUS | 0 refills | Status: DC

## 2022-11-02 NOTE — ED Notes (Signed)
DC instructions reviewed with pt. Pt verbalized understanding.  Pt DC 

## 2022-11-02 NOTE — ED Provider Notes (Signed)
[place] EMERGENCY DEPARTMENT AT [facility] Provider Note   CSN: [id number] Arrival date & time: 11/02/22  1058     History  Chief Complaint  Patient presents with   Fall   Hip Pain    Samuel Little is a 89 y.o. male.  The history is provided by the patient and medical records. No language interpreter was used.  Fall This is a new problem. The current episode started 3 to 5 hours ago. The problem has not changed since onset.Pertinent negatives include no chest pain, no abdominal pain, no headaches and no shortness of breath. The symptoms are aggravated by walking. Nothing relieves the symptoms. He has tried nothing for the symptoms. The treatment provided no relief.  Hip Pain Pertinent negatives include no chest pain, no abdominal pain, no headaches and no shortness of breath.       Home Medications Prior to Admission medications   Medication Sig Start Date End Date Taking? Authorizing Provider  acetaminophen (TYLENOL) 650 MG CR tablet Take 1,300 mg every 8 (eight) hours as needed by mouth for pain.     [provider]  allopurinol (ZYLOPRIM) 100 MG tablet Take 200 mg by mouth daily. 10/18/16   [provider]  Alpha-Lipoic Acid 100 MG CAPS Take 2 capsules by mouth daily.    [provider]  Ascorbic Acid (VITAMIN C) 500 MG CAPS Take 500 mg by mouth daily.     [provider]  Bilberry 100 MG CAPS Take 100 mg daily by mouth.     [provider]  bisacodyl (DULCOLAX) 5 MG EC tablet Take 10 mg daily as needed by mouth for moderate constipation.    [provider]  CALCIUM PO Take 1 tablet by mouth daily.    [provider]  cholecalciferol (VITAMIN D) 1000 units tablet Take 1,000 Units daily by mouth.     [provider]  Coenzyme Q10 (CO Q 10 PO) Take 120 mg daily by mouth.     [provider]  colchicine 0.6 MG tablet Take 1 tablet (0.6 mg total) by mouth daily as needed (gout). Please  take daily for next 5 days until gout flare is resolved, then as needed Patient taking differently: Take 0.6 mg by mouth daily as needed (for gout). 06/28/16   Rai, Ripudeep K, MD  CRANBERRY PO Take 500 mg daily by mouth.     [provider]  Cyanocobalamin (VITAMIN B-12 PO) Take 1 tablet daily by mouth.    [provider]  doxazosin (CARDURA) 4 MG tablet Take 4 mg by mouth at bedtime.     [provider]  Flaxseed, Linseed, (FLAX SEED OIL PO) Take 1 tablet by mouth daily.    [provider]  GINKGO BILOBA PO Take 120 mg daily by mouth.     [provider]  Glucos-Chondroit-Hyaluron-MSM (GLUCOSAMINE CHONDROITIN JOINT PO) Take 2 tablets daily by mouth.     [provider]  guaiFENesin-dextromethorphan (ROBITUSSIN DM) 100-10 MG/5ML syrup Take 5 mLs by mouth every 4 (four) hours as needed for cough. 09/11/22   Jonah Blue, MD  levothyroxine (SYNTHROID) 150 MCG tablet Take 150 mcg by mouth daily before breakfast.    [provider]  lisinopril (PRINIVIL,ZESTRIL) 20 MG tablet Take 1 tablet (20 mg total) by mouth daily. 06/28/16   Rai, Ripudeep Kirtland Bouchard, MD  Lutein 6 MG CAPS Take 6 mg daily by mouth.    [provider]  Omega-3 Fatty Acids (  FISH OIL) 1000 MG CAPS Take 1,000 mg daily by mouth.    [provider]  OVER THE COUNTER MEDICATION Take 2 tablets 2 (two) times daily by mouth. Macular Protect Complete Supplement    [provider]  oxyCODONE (ROXICODONE) 5 MG immediate release tablet Take 1 tablet (5 mg total) by mouth every 8 (eight) hours as needed. Patient not taking: Reported on 10/24/2022 09/11/22 09/11/23  Jonah Blue, MD  polyethylene glycol [facility] Ethelene Hal) packet Take 17 g daily as needed by mouth for moderate constipation.    [provider]  RESVERATROL PO Take 1 tablet by mouth daily.     [provider]  senna (SENOKOT) 8.6 MG TABS tablet Take 1 tablet (8.6 mg total) by mouth at  bedtime as needed for mild constipation. Patient taking differently: Take 1 tablet by mouth daily as needed for mild constipation. 12/20/13   Dhungel, Nishant, MD  TART CHERRY PO Take 1 tablet by mouth daily.    [provider]  Trolamine Salicylate (ASPERCREME EX) Apply 1 application as needed topically (for muscle pain).    [provider]  Turmeric 500 MG CAPS Take 500 mg 2 (two) times daily by mouth.     [provider]      Allergies    Nsaids, Tolmetin, Celecoxib, and Meperidine hcl    Review of Systems   Review of Systems  Constitutional:  Negative for chills, fatigue and fever.  HENT:  Negative for congestion.   Respiratory:  Negative for cough, chest tightness, shortness of breath and wheezing.   Cardiovascular:  Negative for chest pain and palpitations.  Gastrointestinal:  Negative for abdominal pain, constipation, diarrhea, nausea and vomiting.  Genitourinary:  Negative for dysuria.  Musculoskeletal:  Negative for back pain and neck pain.  Skin:  Negative for rash and wound.  Neurological:  Negative for weakness, numbness and headaches.  Psychiatric/Behavioral:  Negative for agitation and confusion.   All other systems reviewed and are negative.   Physical Exam Updated Vital Signs BP (!) 153/67 (BP Location: Left Arm)   Pulse 69   Temp 98.7 F (37.1 C) (Oral)   Resp 16   SpO2 99%  Physical Exam Vitals and nursing note reviewed.  Constitutional:      General: He is not in acute distress.    Appearance: He is well-developed. He is not ill-appearing, toxic-appearing or diaphoretic.  HENT:     Head: Normocephalic and atraumatic.     Nose: Nose normal.     Mouth/Throat:     Mouth: Mucous membranes are moist.  Eyes:     Conjunctiva/sclera: Conjunctivae normal.  Cardiovascular:     Rate and Rhythm: Normal rate and regular rhythm.     Heart sounds: No murmur heard. Pulmonary:     Effort: Pulmonary effort is normal. No respiratory distress.      Breath sounds: Normal breath sounds. No wheezing, rhonchi or rales.  Chest:     Chest wall: No tenderness.  Abdominal:     General: Abdomen is flat.     Palpations: Abdomen is soft.     Tenderness: There is no abdominal tenderness. There is no right CVA tenderness, left CVA tenderness, guarding or rebound.  Musculoskeletal:        General: Tenderness present. No swelling.     Cervical back: Neck supple.     Right hip: Tenderness present. No deformity or lacerations.     Right lower leg: No edema.  Left lower leg: No edema.     Comments: Tenderness to the right hip.  No laceration.  No significant bruising seen initially.  No crepitance.  Normal range of motion.  Intact strength and sensation in legs.  Straight leg raise negative.  Skin:    General: Skin is warm and dry.     Capillary Refill: Capillary refill takes less than 2 seconds.     Findings: No erythema or rash.  Neurological:     General: No focal deficit present.     Mental Status: He is alert.     Sensory: No sensory deficit.     Motor: No weakness.     Comments: Intact sensation, strength, and pulses in lower extremities.  Straight leg raise negative on the right side.  Psychiatric:        Mood and Affect: Mood normal.     ED Results / Procedures / Treatments   Labs (all labs ordered are listed, but only abnormal results are displayed) Labs Reviewed - No data to display  EKG None  Radiology DG Hip Unilat W or Wo Pelvis 2-3 Views Right  Result Date: 11/02/2022 CLINICAL DATA:  Fall, right hip pain and could not bear weight today. EXAM: DG HIP (WITH OR WITHOUT PELVIS) 2-3V RIGHT COMPARISON:  None Available. FINDINGS: Pelvis is intact with normal and symmetric sacroiliac joints. No acute fracture or dislocation. No aggressive osseous lesion. Visualized sacral arcuate lines are unremarkable. Unremarkable symphysis pubis. There are mild degenerative changes of bilateral hip joints without significant joint space  narrowing. Osteophytosis of the superior acetabulum. No radiopaque foreign bodies. IMPRESSION: 1. No acute osseous abnormality of the pelvis and right hip joint. Mild degenerative changes of right hip joint. Electronically Signed   By: Jules Schick M.D.   On: 11/02/2022 13:13    Procedures Procedures    Medications Ordered in ED Medications - No data to display  ED Course/ Medical Decision Making/ A&P                                 Medical Decision Making Amount and/or Complexity of Data Reviewed Radiology: ordered.  Risk Prescription drug management.    Samuel Little is a 89 y.o. male with a past medical history significant for hypertension, hypothyroidism, CKD, BPH, previous right total knee replacement, and hard of hearing who presents with right pelvis and hip pain after a fall.  According to patient, he fell about a week ago where he was outside doing yard work and tripped and fell on his knees and hands.  He reports he did not have much pain after that and was doing well.  He reports that starting yesterday he started having some soreness and pain in his right hip and buttock area and then today he reports his hip worsened having him fall to the ground landing on his right hip.  He did not hit his head and has not any pain in his head, neck, chest or abdomen.  He reports pain in his right hip and pelvis area but denies significant back pain.  He denies any numbness, tingling, or weakness.  He denies any chest pain or shortness of breath.  No other infectious symptoms reported.  Patient was the pain was moderate.  He reports no other injuries and is only complaining of hip pain.  He specifically denies back pain abdominal pain or chest pain.  On exam, straight leg  raise negative bilaterally.  Intact sensation strength and pulses.  He had good hip range of motion with passive movement but just had some tenderness and pain.  He reports the only pain was more when he was twisting or  trying to sit up or move around and not at rest.  No midline back tenderness.  No ecchymosis or rash seen.  Exam otherwise unremarkable.  X-rays were obtained of his hip and pelvis and did not show acute fracture.  Did show arthritis which we discussed.  Family arrived and we had a shared decision-making conversation.  Given his lack of any back tenderness, back pain, and negative straight leg raise with no red flags of numbness tingling or weakness, we agreed to hold on more advanced back imaging at this time.  Patient does report he is post to have a bone marrow biopsy next week and we advised him to call his primary team to let them know about his injury.  Patient was able to ambulate with a cane to the bathroom per nursing.  Given improvement and reassuring imaging, we agreed with discharge with prescription for Lidoderm patches.  We did discuss the possibility of occult fracture that would only be seen on CT or an MRI but we agreed to hold on imaging further today.  Patient and family and other questions or concerns and patient discharged in good condition.           Final Clinical Impression(s) / ED Diagnoses Final diagnoses:  Right hip pain  Fall in home, initial encounter    Rx / DC Orders ED Discharge Orders          Ordered    lidocaine (LIDODERM) 5 %  Every 24 hours        11/02/22 1438            Clinical Impression: 1. Right hip pain   2. Fall in home, initial encounter     Disposition: Discharge  Condition: Good  I have discussed the results, Dx and Tx plan with the pt(& family if present). He/she/they expressed understanding and agree(s) with the plan. Discharge instructions discussed at great length. Strict return precautions discussed and pt &/or family have verbalized understanding of the instructions. No further questions at time of discharge.    Discharge Medication List as of 11/02/2022  2:39 PM     START taking these medications   Details   lidocaine (LIDODERM) 5 % Place 1 patch onto the skin daily. Remove & Discard patch within 12 hours or as directed by MD, Starting Wed 11/02/2022, Print        Follow Up: Georgann Housekeeper, MD [address] [place] [address] [phone]     [facility] Emergency Department at [facility] [address] [place] [address] [phone]       Taheerah Guldin, Canary Brim, MD 11/02/22 [phone]

## 2022-11-02 NOTE — ED Triage Notes (Signed)
Pt here from home with c/o fall one week ago and another fall today  c/o right hip pain did not hit head no loc no thinners

## 2022-11-02 NOTE — Discharge Instructions (Signed)
Your history, exam, and evaluation are consistent with right sided hip pain and musculoskeletal pain after this fall.  The x-ray did not show acute fracture and you had a reassuring exam with intact sensation, strength, pulses, and negative straight leg raise.  We had a shared decision-making conversation and agreed although more extensive workup at this time but do recommend you call your team with your primary doctor and your oncology team for ongoing management.  If any symptoms change or worsen acutely, please return to the nearest emergency department.

## 2022-11-04 ENCOUNTER — Other Ambulatory Visit: Payer: Self-pay

## 2022-11-04 ENCOUNTER — Telehealth: Payer: Self-pay

## 2022-11-04 NOTE — Telephone Encounter (Signed)
This nurse returned a call to this patients wife.  She stated that patient had a fall on Wednesday 8/21 and EMS came and took him to the hospital.  They performed xrays and stated he was ok to discharge home.  She requested to know if the fall would prevent patient from getting his bone marrow biopsy. Per provider this nurse advised that patient is ok to still have his biopsy completed.  She also stated that she has transportation issues and she was not sure if her insurance offers transportation or how to go about setting up transportation for patients appointment on 8/27.  This nurse advised that a message was sent to the transportation coordinator and asked him to give them a call.  He will be able to assist with her transportation questions. She acknowledged understanding and has no further questions or concerns at this time.

## 2022-11-07 ENCOUNTER — Other Ambulatory Visit: Payer: Self-pay | Admitting: Student

## 2022-11-07 DIAGNOSIS — D649 Anemia, unspecified: Secondary | ICD-10-CM

## 2022-11-08 ENCOUNTER — Other Ambulatory Visit: Payer: Self-pay

## 2022-11-08 ENCOUNTER — Ambulatory Visit (HOSPITAL_COMMUNITY)
Admission: RE | Admit: 2022-11-08 | Discharge: 2022-11-08 | Disposition: A | Discharge status: Home / Self Care | Payer: Medicare Other | Source: Ambulatory Visit | Attending: Physician Assistant | Admitting: Physician Assistant

## 2022-11-08 DIAGNOSIS — N400 Enlarged prostate without lower urinary tract symptoms: Secondary | ICD-10-CM | POA: Insufficient documentation

## 2022-11-08 DIAGNOSIS — D61818 Other pancytopenia: Secondary | ICD-10-CM | POA: Diagnosis present

## 2022-11-08 DIAGNOSIS — R911 Solitary pulmonary nodule: Secondary | ICD-10-CM | POA: Insufficient documentation

## 2022-11-08 DIAGNOSIS — D649 Anemia, unspecified: Secondary | ICD-10-CM

## 2022-11-08 DIAGNOSIS — E039 Hypothyroidism, unspecified: Secondary | ICD-10-CM | POA: Diagnosis not present

## 2022-11-08 DIAGNOSIS — J90 Pleural effusion, not elsewhere classified: Secondary | ICD-10-CM | POA: Diagnosis not present

## 2022-11-08 DIAGNOSIS — I700 Atherosclerosis of aorta: Secondary | ICD-10-CM | POA: Insufficient documentation

## 2022-11-08 DIAGNOSIS — M109 Gout, unspecified: Secondary | ICD-10-CM | POA: Insufficient documentation

## 2022-11-08 DIAGNOSIS — I10 Essential (primary) hypertension: Secondary | ICD-10-CM | POA: Insufficient documentation

## 2022-11-08 LAB — CBC WITH DIFFERENTIAL/PLATELET
Abs Immature Granulocytes: 0.05 10*3/uL (ref 0.00–0.07)
Basophils Absolute: 0 10*3/uL (ref 0.0–0.1)
Basophils Relative: 1 %
Eosinophils Absolute: 0.1 10*3/uL (ref 0.0–0.5)
Eosinophils Relative: 4 %
HCT: 28.3 % — ABNORMAL LOW (ref 39.0–52.0)
Hemoglobin: 8.8 g/dL — ABNORMAL LOW (ref 13.0–17.0)
Immature Granulocytes: 2 %
Lymphocytes Relative: 24 %
Lymphs Abs: 0.6 10*3/uL — ABNORMAL LOW (ref 0.7–4.0)
MCH: 30.3 pg (ref 26.0–34.0)
MCHC: 31.1 g/dL (ref 30.0–36.0)
MCV: 97.6 fL (ref 80.0–100.0)
Monocytes Absolute: 0.3 10*3/uL (ref 0.1–1.0)
Monocytes Relative: 12 %
Neutro Abs: 1.4 10*3/uL — ABNORMAL LOW (ref 1.7–7.7)
Neutrophils Relative %: 57 %
Platelets: 111 10*3/uL — ABNORMAL LOW (ref 150–400)
RBC: 2.9 MIL/uL — ABNORMAL LOW (ref 4.22–5.81)
RDW: 19.7 % — ABNORMAL HIGH (ref 11.5–15.5)
WBC: 2.5 10*3/uL — ABNORMAL LOW (ref 4.0–10.5)
nRBC: 0 % (ref 0.0–0.2)

## 2022-11-08 MED ORDER — FENTANYL CITRATE (PF) 100 MCG/2ML IJ SOLN
INTRAMUSCULAR | Status: AC | PRN
  Administered 2022-11-08: 25 ug via INTRAVENOUS

## 2022-11-08 MED ORDER — FENTANYL CITRATE (PF) 100 MCG/2ML IJ SOLN
INTRAMUSCULAR | Status: AC
  Filled 2022-11-08: qty 2

## 2022-11-08 MED ORDER — MIDAZOLAM HCL 2 MG/2ML IJ SOLN
INTRAMUSCULAR | Status: AC
  Filled 2022-11-08: qty 2

## 2022-11-08 MED ORDER — MIDAZOLAM HCL 2 MG/2ML IJ SOLN
INTRAMUSCULAR | Status: AC | PRN
Start: 2022-11-08 — End: 2022-11-08
  Administered 2022-11-08: .5 mg via INTRAVENOUS

## 2022-11-08 MED ORDER — MIDAZOLAM HCL 2 MG/2ML IJ SOLN
INTRAMUSCULAR | Status: AC | PRN
  Administered 2022-11-08: .5 mg via INTRAVENOUS

## 2022-11-08 MED ORDER — FLUMAZENIL 0.5 MG/5ML IV SOLN
INTRAVENOUS | Status: AC
  Filled 2022-11-08: qty 5

## 2022-11-08 MED ORDER — NALOXONE HCL 0.4 MG/ML IJ SOLN
INTRAMUSCULAR | Status: AC
  Filled 2022-11-08: qty 1

## 2022-11-08 MED ORDER — SODIUM CHLORIDE 0.9 % IV SOLN: INTRAVENOUS | Status: DC

## 2022-11-08 NOTE — OR Nursing (Signed)
Patient ambulated in room with cane and stand by assist. Slow but steady gait noted.

## 2022-11-08 NOTE — Discharge Instructions (Signed)
 Please call Interventional Radiology clinic [phone] with any questions or concerns.  You may remove your dressing and shower tomorrow.  After the procedure, it is common to have: Soreness Bruising Mild pain  Follow these instructions at home:  Medication: Do not use Aspirin or ibuprofen products, such as Advil or Motrin, as it may increase bleeding You may resume your usual medications as ordered by your doctor If your doctor prescribed antibiotics, take them as directed. Do not stop taking them just because you feel better. You need to take the full course of antibiotics  Eating and drinking: Drink plenty of liquids to keep your urine pale yellow You can resume your regular diet as directed by your doctor   Care of the procedure site  Check your biopsy site every day until it heals  Keep the bandage dry. You can take the bandage off and shower tomorrow  If you are bleeding from the biopsy site, apply firm pressure on the area for at least 30 minutes  If directed, apply ice to your biopsy site 2-3 times a day.  o Put ice in a plastic bag  o Place a towel between your skin and the ice bag  o Leave the ice in place for 20 minutes 2-3 times a day  Activity Do not take baths, swim, or use a hot tub until your health care provider approves  Keep all follow-up visits as told by your doctor  Contact your doctor or seek immediate medical care if: You have bright red bleeding from the biopsy site that does not stop after 30 minutes of holding direct pressure on the site. You have signs of infection, such as: Increased pain, swelling, warmth, or redness at the biopsy site Red streaks leading from the biopsy site Yellow or green drainage from the biopsy site A fever (temperature over 100.49F) chills, or both    Moderate Conscious Sedation-Care After  This sheet gives you information about how to care for yourself after your procedure. Your health care provider may also give you  more specific instructions. If you have problems or questions, contact your health care provider.  After the procedure, it is common to have: Sleepiness for several hours. Impaired judgment for several hours. Difficulty with balance. Vomiting if you eat too soon.  Follow these instructions at home:  Rest. Do not participate in activities where you could fall or become injured. Do not drive or use machinery. Do not drink alcohol. Do not take sleeping pills or medicines that cause drowsiness. Do not make important decisions or sign legal documents. Do not take care of children on your own.  Eating and drinking Follow the diet recommended by your health care provider. Drink enough fluid to keep your urine pale yellow. If you vomit: Drink water, juice, or soup when you can drink without vomiting. Make sure you have little or no nausea before eating solid foods.  General instructions Take over-the-counter and prescription medicines only as told by your health care provider. Have a responsible adult stay with you for the time you are told. It is important to have someone help care for you until you are awake and alert. Do not smoke. Keep all follow-up visits as told by your health care provider. This is important.  Contact a health care provider if: You are still sleepy or having trouble with balance after 24 hours. You feel light-headed. You keep feeling nauseous or you keep vomiting. You develop a rash. You have a fever. You  have redness or swelling around the IV site.  Get help right away if: You have trouble breathing. You have new-onset confusion at home.  This information is not intended to replace advice given to you by your health care provider. Make sure you discuss any questions you have with your healthcare provider.

## 2022-11-08 NOTE — Procedures (Signed)
Interventional Radiology Procedure Note  Procedure: CT guided bone marrow aspiration and biopsy  Complications: None  EBL: < 10 mL  Findings: Aspirate and core biopsy performed of bone marrow in right iliac bone.  Plan: Bedrest supine x 1 hrs  Glenn T. Yamagata, M.D Pager:  [phone]   

## 2022-11-08 NOTE — OR Nursing (Signed)
Patient stable for d/c, awaiting daughter for transportation home.

## 2022-11-08 NOTE — H&P (Signed)
Chief Complaint: Patient was seen in consultation today for bone marrow biopsy with aspiration.   Referring Physician(s): Heilingoetter,Cassandra L  Supervising Physician: Irish Lack  Patient Status: [facility] - Out-pt  History of Present Illness: Samuel Little is a 89 y.o. male with a medical history significant for hypothyroidism, HTN, SVT, BPH, gout and macular degeneration. He was recently hospitalized 6/28-6/30 for LLL pneumonia with left pleural effusion. He followed up with his PCP 09/29/22 and lab work showed pancytopenia. He was referred to Hematology/Oncology for further evaluation and recommendations. Additional work up has been unrevealing.    Interventional Radiology has been asked to evaluate this patient for an image-guided bone marrow biopsy with aspiration.   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 , march 2,2011    Arthritis    Diverticulosis    Effusion, pericardium    Gastroesophageal reflux disease    Gout    Hard of hearing    wears bilateral hearing aids   History of kidney stones    Hypertension    Hypothyroidism    Macular degeneration    Pleural effusion, left    Right bundle branch block    Thyroid disease    hypothyroidism   Wears glasses     Past Surgical History:  Procedure Laterality Date   ARTHROSCOPY KNEE W/ DRILLING Right    CARDIAC CATHETERIZATION  2007   CARDIAC ELECTROPHYSIOLOGY STUDY AND ABLATION     CATARACT EXTRACTION     CHEST TUBE INSERTION     CHOLECYSTECTOMY  06/26/2016   Procedure: LAPAROSCOPIC CHOLECYSTECTOMY  subtotal;  Surgeon: Almond Lint, MD;  Location: [facility];  Service: General;;   FRACTURE SURGERY Right 2001   right leg   HERNIA REPAIR     IR THORACENTESIS ASP PLEURAL SPACE W/IMG GUIDE  09/09/2022   MASS EXCISION Right 09/22/2015   Procedure: RIGHT THUMB EXCISION MASS;  Surgeon: Betha Loa, MD;  Location:  [facility];  Service: Orthopedics;  Laterality: Right;   ROTATOR CUFF REPAIR     ROTATOR CUFF REPAIR Right 2002   TONSILLECTOMY     TOTAL KNEE ARTHROPLASTY Right 02/13/2017   Procedure: TOTAL KNEE ARTHROPLASTY;  Surgeon: Dannielle Huh, MD;  Location: [facility] OR;  Service: Orthopedics;  Laterality: Right;    Allergies: Nsaids, Tolmetin, Celecoxib, and Meperidine hcl  Medications: Prior to Admission medications   Medication Sig Start Date End Date Taking? Authorizing Provider  acetaminophen (TYLENOL) 650 MG CR tablet Take 1,300 mg every 8 (eight) hours as needed by mouth for pain.     [provider]  allopurinol (ZYLOPRIM) 100 MG tablet Take 200 mg by mouth daily. 10/18/16   [provider]  Alpha-Lipoic Acid 100 MG CAPS Take 2 capsules by mouth daily.    [provider]  Ascorbic Acid (VITAMIN C) 500 MG CAPS Take 500 mg by mouth daily.     [provider]  Bilberry 100 MG CAPS Take 100 mg daily by mouth.     [provider]  bisacodyl (DULCOLAX) 5 MG EC tablet Take 10 mg daily as needed by mouth for moderate constipation.    [provider]  CALCIUM PO Take 1 tablet by mouth daily.    [provider]  cholecalciferol (VITAMIN D) 1000 units tablet Take 1,000 Units daily by mouth.     [provider]  Coenzyme Q10 (CO Q 10 PO) Take  120 mg daily by mouth.     [provider]  colchicine 0.6 MG tablet Take 1 tablet (0.6 mg total) by mouth daily as needed (gout). Please take daily for next 5 days until gout flare is resolved, then as needed Patient taking differently: Take 0.6 mg by mouth daily as needed (for gout). 06/28/16   Rai, Ripudeep K, MD  CRANBERRY PO Take 500 mg daily by mouth.     [provider]  Cyanocobalamin (VITAMIN B-12 PO) Take 1 tablet daily by mouth.    [provider]  doxazosin (CARDURA) 4 MG tablet Take 4 mg by mouth at bedtime.     [provider]  Flaxseed,  Linseed, (FLAX SEED OIL PO) Take 1 tablet by mouth daily.    [provider]  GINKGO BILOBA PO Take 120 mg daily by mouth.     [provider]  Glucos-Chondroit-Hyaluron-MSM (GLUCOSAMINE CHONDROITIN JOINT PO) Take 2 tablets daily by mouth.     [provider]  guaiFENesin-dextromethorphan (ROBITUSSIN DM) 100-10 MG/5ML syrup Take 5 mLs by mouth every 4 (four) hours as needed for cough. 09/11/22   Jonah Blue, MD  levothyroxine (SYNTHROID) 150 MCG tablet Take 150 mcg by mouth daily before breakfast.    [provider]  lidocaine (LIDODERM) 5 % Place 1 patch onto the skin daily. Remove & Discard patch within 12 hours or as directed by MD 11/02/22   Tegeler, Canary Brim, MD  lisinopril (PRINIVIL,ZESTRIL) 20 MG tablet Take 1 tablet (20 mg total) by mouth daily. 06/28/16   Rai, Ripudeep Kirtland Bouchard, MD  Lutein 6 MG CAPS Take 6 mg daily by mouth.    [provider]  Omega-3 Fatty Acids (FISH OIL) 1000 MG CAPS Take 1,000 mg daily by mouth.    [provider]  OVER THE COUNTER MEDICATION Take 2 tablets 2 (two) times daily by mouth. Macular Protect Complete Supplement    [provider]  oxyCODONE (ROXICODONE) 5 MG immediate release tablet Take 1 tablet (5 mg total) by mouth every 8 (eight) hours as needed. Patient not taking: Reported on 10/24/2022 09/11/22 09/11/23  Jonah Blue, MD  polyethylene glycol [facility] / Ethelene Hal) packet Take 17 g daily as needed by mouth for moderate constipation.    [provider]  RESVERATROL PO Take 1 tablet by mouth daily.     [provider]  senna (SENOKOT) 8.6 MG TABS tablet Take 1 tablet (8.6 mg total) by mouth at bedtime as needed for mild constipation. Patient taking differently: Take 1 tablet by mouth daily as needed for mild constipation. 12/20/13   Dhungel, Nishant, MD  TART CHERRY PO Take 1 tablet by mouth daily.    [provider]  Trolamine Salicylate (ASPERCREME EX) Apply 1  application as needed topically (for muscle pain).    [provider]  Turmeric 500 MG CAPS Take 500 mg 2 (two) times daily by mouth.     [provider]     Family History  Problem Relation Age of Onset   Hypertension Mother    Diabetes Mother    Hypertension Father    Heart attack Father    Diabetes Father    Heart attack Brother    Neuropathy Brother    Heart attack Paternal Uncle    Heart attack Paternal Uncle    Heart attack Paternal Uncle    Other Son        Leak in aorta    Social History   Socioeconomic  History   Marital status: Married    Spouse name: Not on file   Number of children: Not on file   Years of education: Not on file   Highest education level: Not on file  Occupational History   Not on file  Tobacco Use   Smoking status: Never   Smokeless tobacco: Never  Substance and Sexual Activity   Alcohol use: No   Drug use: No   Sexual activity: Not on file  Other Topics Concern   Not on file  Social History Narrative   Not on file   Social Determinants of Health   Financial Resource Strain: Not on file  Food Insecurity: Not on file  Transportation Needs: Not on file  Physical Activity: Not on file  Stress: Not on file  Social Connections: Not on file    Review of Systems: A 12 point ROS discussed and pertinent positives are indicated in the HPI above.  All other systems are negative.  Review of Systems  Constitutional:  Negative for appetite change and fatigue.  Respiratory:  Negative for cough and shortness of breath.   Cardiovascular:  Negative for chest pain and leg swelling.  Gastrointestinal:  Positive for constipation. Negative for abdominal pain, diarrhea, nausea and vomiting.  Neurological:  Negative for dizziness and headaches.    Vital Signs: BP (!) 115/59   Temp 97.6 F (36.4 C) (Oral)   Resp 16   Ht 5\' 9"  (1.753 m)   Wt 182 lb 9.6 oz (82.8 kg)   BMI 26.97 kg/m   Physical Exam Constitutional:       General: He is not in acute distress.    Appearance: He is not ill-appearing.  HENT:     Mouth/Throat:     Mouth: Mucous membranes are moist.     Pharynx: Oropharynx is clear.  Cardiovascular:     Rate and Rhythm: Normal rate and regular rhythm.     Pulses: Normal pulses.     Heart sounds: Normal heart sounds.  Pulmonary:     Effort: Pulmonary effort is normal.     Breath sounds: Normal breath sounds.  Abdominal:     General: Bowel sounds are normal.     Palpations: Abdomen is soft.     Tenderness: There is no abdominal tenderness.  Skin:    General: Skin is warm and dry.  Neurological:     Mental Status: He is alert and oriented to person, place, and time.  Psychiatric:        Mood and Affect: Mood normal.        Behavior: Behavior normal.        Thought Content: Thought content normal.        Judgment: Judgment normal.     Imaging: DG Chest 2 View  Result Date: 11/04/2022 CLINICAL DATA:  Pleural effusion EXAM: CHEST - 2 VIEW COMPARISON:  09/28/2022 FINDINGS: Unremarkable cardiac silhouette. Left-sided pleural effusion present with interval slight decreased compared to the prior study. There is improved aeration of the left base. Right lung remains clear. Nodular lesion left upper lung may represent the end of the first rib. The aorta is calcified. No pneumothorax. IMPRESSION: Findings suggest interval improvement with persistent but slightly decreased left-sided pleural effusion and improved aeration at the left base. Electronically Signed   By: Layla Maw M.D.   On: 11/04/2022 17:06   DG Hip Unilat W or Wo Pelvis 2-3 Views Right  Result Date: 11/02/2022 CLINICAL DATA:  Fall, right hip pain  and could not bear weight today. EXAM: DG HIP (WITH OR WITHOUT PELVIS) 2-3V RIGHT COMPARISON:  None Available. FINDINGS: Pelvis is intact with normal and symmetric sacroiliac joints. No acute fracture or dislocation. No aggressive osseous lesion. Visualized sacral arcuate lines are  unremarkable. Unremarkable symphysis pubis. There are mild degenerative changes of bilateral hip joints without significant joint space narrowing. Osteophytosis of the superior acetabulum. No radiopaque foreign bodies. IMPRESSION: 1. No acute osseous abnormality of the pelvis and right hip joint. Mild degenerative changes of right hip joint. Electronically Signed   By: Jules Schick M.D.   On: 11/02/2022 13:13    Labs:  CBC: Recent Labs    09/09/22 0842 09/10/22 0326 09/11/22 0414 10/24/22 1301  WBC 3.9* 4.6 5.3 2.9*  HGB 10.8* 8.8* 9.3* 9.8*  HCT 32.7* 26.2* 28.1* 28.8*  PLT 172 135* 141* 88*    COAGS: Recent Labs    09/09/22 1425  INR 1.3*    BMP: Recent Labs    09/09/22 0842 09/10/22 0326 09/11/22 0414 10/24/22 1301  NA 137 134* 133* 139  K 3.7 3.7 4.1 4.0  CL 104 105 103 107  CO2 20* 23 23 26   GLUCOSE 96 95 88 86  BUN 11 15 18 13   CALCIUM 8.7* 8.4* 8.3* 8.8*  CREATININE 0.74 0.75 0.68 0.76  GFRNONAA >60 >60 >60 >60    LIVER FUNCTION TESTS: Recent Labs    09/09/22 0842 10/24/22 1301  BILITOT 0.8 0.9  AST 72* 33  ALT 69* 20  ALKPHOS 339* 226*  PROT 6.2* 6.5  ALBUMIN 2.6* 3.2*    TUMOR MARKERS: No results for input(s): "AFPTM", "CEA", "CA199", "CHROMGRNA" in the last 8760 hours.  Assessment and Plan:  Pancytopenia: Samuel Little, 89 year old male, presents today to the [facility] Interventional Radiology department for an image-guided bone marrow biopsy with aspiration.  Risks and benefits of this procedure were discussed with the patient and/or patient's family including, but not limited to bleeding, infection, damage to adjacent structures or low yield requiring additional tests.  All of the questions were answered and there is agreement to proceed. He has been NPO. He is a full code.   Consent signed and in chart.  Thank you for this interesting consult.  I greatly enjoyed meeting Samuel Little and look forward to participating in their  care.  A copy of this report was sent to the requesting provider on this date.  Electronically Signed: Alwyn Ren, AGACNP-BC [phone] 11/08/2022, 9:55 AM   I spent a total of  30 Minutes   in face to face in clinical consultation, greater than 50% of which was counseling/coordinating care for bone marrow biopsy with aspiration.

## 2022-11-17 LAB — SURGICAL PATHOLOGY

## 2022-11-21 ENCOUNTER — Inpatient Hospital Stay: Payer: Medicare Other | Attending: Internal Medicine

## 2022-11-21 ENCOUNTER — Inpatient Hospital Stay (HOSPITAL_BASED_OUTPATIENT_CLINIC_OR_DEPARTMENT_OTHER): Payer: Medicare Other | Admitting: Internal Medicine

## 2022-11-21 VITALS — BP 109/59 | HR 77 | Temp 98.2°F | Resp 17 | Ht 69.0 in | Wt 176.0 lb

## 2022-11-21 DIAGNOSIS — D61818 Other pancytopenia: Secondary | ICD-10-CM | POA: Insufficient documentation

## 2022-11-21 LAB — CBC WITH DIFFERENTIAL (CANCER CENTER ONLY)
Abs Immature Granulocytes: 0.02 10*3/uL (ref 0.00–0.07)
Basophils Absolute: 0 10*3/uL (ref 0.0–0.1)
Basophils Relative: 1 %
Eosinophils Absolute: 0.1 10*3/uL (ref 0.0–0.5)
Eosinophils Relative: 3 %
HCT: 30.3 % — ABNORMAL LOW (ref 39.0–52.0)
Hemoglobin: 9.7 g/dL — ABNORMAL LOW (ref 13.0–17.0)
Immature Granulocytes: 1 %
Lymphocytes Relative: 31 %
Lymphs Abs: 0.8 10*3/uL (ref 0.7–4.0)
MCH: 31.1 pg (ref 26.0–34.0)
MCHC: 32 g/dL (ref 30.0–36.0)
MCV: 97.1 fL (ref 80.0–100.0)
Monocytes Absolute: 0.2 10*3/uL (ref 0.1–1.0)
Monocytes Relative: 8 %
Neutro Abs: 1.5 10*3/uL — ABNORMAL LOW (ref 1.7–7.7)
Neutrophils Relative %: 56 %
Platelet Count: 102 10*3/uL — ABNORMAL LOW (ref 150–400)
RBC: 3.12 MIL/uL — ABNORMAL LOW (ref 4.22–5.81)
RDW: 19.6 % — ABNORMAL HIGH (ref 11.5–15.5)
WBC Count: 2.6 10*3/uL — ABNORMAL LOW (ref 4.0–10.5)
nRBC: 0 % (ref 0.0–0.2)

## 2022-11-21 NOTE — Progress Notes (Signed)
[facility] Telephone:([phone]   [phone]  OFFICE PROGRESS NOTE  Georgann Housekeeper, MD [address] [place] [address]  DIAGNOSIS: Pancytopenia of unclear etiology with negative bone marrow biopsy and aspirate.  The findings were suspicious for drug-induced versus autoimmune induced abnormality.  PRIOR THERAPY: None  CURRENT THERAPY: None  INTERVAL HISTORY: Samuel Little 89 y.o. male returns to the clinic today for follow-up visit.  The patient was accompanied by his wife.  He is feeling fine today with no concerning complaints except for the baseline fatigue and weakness and weight loss.  He had extensive workup for the pancytopenia including hepatitis panel, HIV, ANA, rheumatoid factor, iron study and ferritin, serum folate and vitamin B12.  These were all unremarkable except for the elevated rheumatoid factor.  He also had a bone marrow biopsy and aspirate that showed no underlying myelodysplasia or myeloproliferative disorder and the finding were suspicious to be secondary to drugs/toxin or autoimmune disorder.  He is here today for evaluation and repeat blood work.  MEDICAL HISTORY: 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 , march 2,2011    Arthritis    Diverticulosis    Effusion, pericardium    Gastroesophageal reflux disease    Gout    Hard of hearing    wears bilateral hearing aids   History of kidney stones    Hypertension    Hypothyroidism    Macular degeneration    Pleural effusion, left    Right bundle branch block    Thyroid disease    hypothyroidism   Wears glasses     ALLERGIES:  is allergic to nsaids, tolmetin, celecoxib, and meperidine hcl.  MEDICATIONS:  Current Outpatient Medications  Medication Sig Dispense Refill   acetaminophen (TYLENOL) 650 MG CR tablet Take 1,300 mg every 8 (eight) hours as  needed by mouth for pain.      allopurinol (ZYLOPRIM) 100 MG tablet Take 200 mg by mouth daily.     Alpha-Lipoic Acid 100 MG CAPS Take 2 capsules by mouth daily.     Ascorbic Acid (VITAMIN C) 500 MG CAPS Take 500 mg by mouth daily.      Bilberry 100 MG CAPS Take 100 mg daily by mouth.      bisacodyl (DULCOLAX) 5 MG EC tablet Take 10 mg daily as needed by mouth for moderate constipation.     CALCIUM PO Take 1 tablet by mouth daily.     cholecalciferol (VITAMIN D) 1000 units tablet Take 1,000 Units daily by mouth.      Coenzyme Q10 (CO Q 10 PO) Take 120 mg daily by mouth.      colchicine 0.6 MG tablet Take 1 tablet (0.6 mg total) by mouth daily as needed (gout). Please take daily for next 5 days until gout flare is resolved, then as needed (Patient taking differently: Take 0.6 mg by mouth daily as needed (for gout).) 30 tablet 1   CRANBERRY PO Take 500 mg daily by mouth.      Cyanocobalamin (VITAMIN B-12 PO) Take 1 tablet daily by mouth.     doxazosin (CARDURA) 4 MG tablet Take 4 mg by mouth at bedtime.      ELDERBERRY PO Take 1 capsule by mouth daily.     Flaxseed, Linseed, (FLAX SEED OIL PO) Take 1 tablet by mouth daily.     GINKGO  BILOBA PO Take 120 mg daily by mouth.      Glucos-Chondroit-Hyaluron-MSM (GLUCOSAMINE CHONDROITIN JOINT PO) Take 2 tablets daily by mouth.      guaiFENesin-dextromethorphan (ROBITUSSIN DM) 100-10 MG/5ML syrup Take 5 mLs by mouth every 4 (four) hours as needed for cough. 118 mL 0   levothyroxine (SYNTHROID) 150 MCG tablet Take 150 mcg by mouth daily before breakfast.     lidocaine (LIDODERM) 5 % Place 1 patch onto the skin daily. Remove & Discard patch within 12 hours or as directed by MD 15 patch 0   lisinopril (PRINIVIL,ZESTRIL) 20 MG tablet Take 1 tablet (20 mg total) by mouth daily. 30 tablet 3   Lutein 6 MG CAPS Take 6 mg daily by mouth.     Omega-3 Fatty Acids (FISH OIL) 1000 MG CAPS Take 1,000 mg daily by mouth.     OVER THE COUNTER MEDICATION Take 2 tablets 2  (two) times daily by mouth. Macular Protect Complete Supplement     oxyCODONE (ROXICODONE) 5 MG immediate release tablet Take 1 tablet (5 mg total) by mouth every 8 (eight) hours as needed. (Patient not taking: Reported on 10/24/2022) 10 tablet 0   polyethylene glycol (MIRALAX / GLYCOLAX) packet Take 17 g daily as needed by mouth for moderate constipation.     RESVERATROL PO Take 1 tablet by mouth daily.      senna (SENOKOT) 8.6 MG TABS tablet Take 1 tablet (8.6 mg total) by mouth at bedtime as needed for mild constipation. (Patient taking differently: Take 1 tablet by mouth daily as needed for mild constipation.) 10 each 0   TART CHERRY PO Take 1 tablet by mouth daily.     Trolamine Salicylate (ASPERCREME EX) Apply 1 application as needed topically (for muscle pain).     Turmeric 500 MG CAPS Take 500 mg 2 (two) times daily by mouth.      No current facility-administered medications for this visit.    SURGICAL HISTORY:  Past Surgical History:  Procedure Laterality Date   ARTHROSCOPY KNEE W/ DRILLING Right    CARDIAC CATHETERIZATION  2007   CARDIAC ELECTROPHYSIOLOGY STUDY AND ABLATION     CATARACT EXTRACTION     CHEST TUBE INSERTION     CHOLECYSTECTOMY  06/26/2016   Procedure: LAPAROSCOPIC CHOLECYSTECTOMY  subtotal;  Surgeon: Almond Lint, MD;  Location: [facility] OR;  Service: General;;   FRACTURE SURGERY Right 2001   right leg   HERNIA REPAIR     IR THORACENTESIS ASP PLEURAL SPACE W/IMG GUIDE  09/09/2022   MASS EXCISION Right 09/22/2015   Procedure: RIGHT THUMB EXCISION MASS;  Surgeon: Betha Loa, MD;  Location: [facility];  Service: Orthopedics;  Laterality: Right;   ROTATOR CUFF REPAIR     ROTATOR CUFF REPAIR Right 2002   TONSILLECTOMY     TOTAL KNEE ARTHROPLASTY Right 02/13/2017   Procedure: TOTAL KNEE ARTHROPLASTY;  Surgeon: Dannielle Huh, MD;  Location: [facility] OR;  Service: Orthopedics;  Laterality: Right;    REVIEW OF SYSTEMS:  Constitutional: positive for anorexia, fatigue,  and weight loss Eyes: negative Ears, nose, mouth, throat, and face: negative Respiratory: negative Cardiovascular: negative Gastrointestinal: negative Genitourinary:negative Integument/breast: negative Hematologic/lymphatic: negative Musculoskeletal:positive for arthralgias Neurological: negative Behavioral/Psych: negative Endocrine: negative Allergic/Immunologic: negative   PHYSICAL EXAMINATION: General appearance: alert, cooperative, fatigued, and no distress Head: Normocephalic, without obvious abnormality, atraumatic Neck: no adenopathy, no JVD, supple, symmetrical, trachea midline, and thyroid not enlarged, symmetric, no tenderness/mass/nodules Lymph nodes: Cervical, supraclavicular, and axillary nodes normal. Resp: clear  to auscultation bilaterally Back: symmetric, no curvature. ROM normal. No CVA tenderness. Cardio: regular rate and rhythm, S1, S2 normal, no murmur, click, rub or gallop GI: soft, non-tender; bowel sounds normal; no masses,  no organomegaly Extremities: extremities normal, atraumatic, no cyanosis or edema Neurologic: Alert and oriented X 3, normal strength and tone. Normal symmetric reflexes. Normal coordination and gait  ECOG PERFORMANCE STATUS: 1 - Symptomatic but completely ambulatory  Blood pressure (!) 109/59, pulse 77, temperature 98.2 F (36.8 C), temperature source Oral, resp. rate 17, height 5\' 9"  (1.753 m), weight 176 lb (79.8 kg), SpO2 98%.  LABORATORY DATA: Lab Results  Component Value Date   WBC 2.6 (L) 11/21/2022   HGB 9.7 (L) 11/21/2022   HCT 30.3 (L) 11/21/2022   MCV 97.1 11/21/2022   PLT 102 (L) 11/21/2022      Chemistry      Component Value Date/Time   NA 139 10/24/2022 1301   K 4.0 10/24/2022 1301   CL 107 10/24/2022 1301   CO2 26 10/24/2022 1301   BUN 13 10/24/2022 1301   CREATININE 0.76 10/24/2022 1301      Component Value Date/Time   CALCIUM 8.8 (L) 10/24/2022 1301   ALKPHOS 226 (H) 10/24/2022 1301   AST 33 10/24/2022  1301   ALT 20 10/24/2022 1301   BILITOT 0.9 10/24/2022 1301       RADIOGRAPHIC STUDIES: CT BONE MARROW BIOPSY & ASPIRATION  Result Date: 11/08/2022 CLINICAL DATA:  Pancytopenia and need for bone marrow biopsy. EXAM: CT GUIDED BONE MARROW ASPIRATION AND BIOPSY ANESTHESIA/SEDATION: Moderate (conscious) sedation was employed during this procedure. A total of Versed 1.0 mg and Fentanyl 50 mcg was administered intravenously by radiology nursing. Moderate Sedation Time: 13 minutes. The patient's level of consciousness and vital signs were monitored continuously by radiology nursing throughout the procedure under my direct supervision. PROCEDURE: The procedure risks, benefits, and alternatives were explained to the patient. Questions regarding the procedure were encouraged and answered. The patient understands and consents to the procedure. A time out was performed prior to initiating the procedure. The right gluteal region was prepped with chlorhexidine. Sterile gown and sterile gloves were used for the procedure. Local anesthesia was provided with 1% Lidocaine. Under CT guidance, an 11 gauge On Control bone cutting needle was advanced from a posterior approach into the right iliac bone. Needle positioning was confirmed with CT. Initial non heparinized and heparinized aspirate samples were obtained of bone marrow. Core biopsy was performed via the On Control drill needle. COMPLICATIONS: None FINDINGS: Inspection of initial aspirate did reveal visible particles. Intact core biopsy sample was obtained. IMPRESSION: CT guided bone marrow biopsy of right posterior iliac bone with both aspirate and core samples obtained. Electronically Signed   By: Irish Lack M.D.   On: 11/08/2022 13:39   DG Chest 2 View  Result Date: 11/04/2022 CLINICAL DATA:  Pleural effusion EXAM: CHEST - 2 VIEW COMPARISON:  09/28/2022 FINDINGS: Unremarkable cardiac silhouette. Left-sided pleural effusion present with interval slight  decreased compared to the prior study. There is improved aeration of the left base. Right lung remains clear. Nodular lesion left upper lung may represent the end of the first rib. The aorta is calcified. No pneumothorax. IMPRESSION: Findings suggest interval improvement with persistent but slightly decreased left-sided pleural effusion and improved aeration at the left base. Electronically Signed   By: Layla Maw M.D.   On: 11/04/2022 17:06   DG Hip Unilat W or Wo Pelvis 2-3 Views Right  Result  Date: 11/02/2022 CLINICAL DATA:  Fall, right hip pain and could not bear weight today. EXAM: DG HIP (WITH OR WITHOUT PELVIS) 2-3V RIGHT COMPARISON:  None Available. FINDINGS: Pelvis is intact with normal and symmetric sacroiliac joints. No acute fracture or dislocation. No aggressive osseous lesion. Visualized sacral arcuate lines are unremarkable. Unremarkable symphysis pubis. There are mild degenerative changes of bilateral hip joints without significant joint space narrowing. Osteophytosis of the superior acetabulum. No radiopaque foreign bodies. IMPRESSION: 1. No acute osseous abnormality of the pelvis and right hip joint. Mild degenerative changes of right hip joint. Electronically Signed   By: Jules Schick M.D.   On: 11/02/2022 13:13    ASSESSMENT AND PLAN: This is a very pleasant 89 years old white male with pancytopenia likely secondary to medication or autoimmune disorder specially with the elevated rheumatoid factor.  The patient had extensive studies that were unremarkable and his bone marrow biopsy and aspirate performed recently showed no underlying MDS or MPN.  The findings were suspicious to be secondary to drug/toxin or autoimmune disorder. I discussed the lab results with the patient and his wife. I recommended for the patient to continue with the over-the-counter multivitamin supplements for now.  I also advised him to avoid any other unknown nutritional supplements at this point. He will  follow-up with his primary care physician from now on and I will be happy to see him in the future if there is any other concerning findings but he is likely to have persistent pancytopenia. He may benefit from evaluation by rheumatology. The patient was advised to call if he has any other concerning issues. The patient voices understanding of current disease status and treatment options and is in agreement with the current care plan.  All questions were answered. The patient knows to call the clinic with any problems, questions or concerns. We can certainly see the patient much sooner if necessary.  The total time spent in the appointment was 30 minutes.  Disclaimer: This note was dictated with voice recognition software. Similar sounding words can inadvertently be transcribed and may not be corrected upon review.

## 2022-11-22 ENCOUNTER — Encounter (HOSPITAL_COMMUNITY): Payer: Self-pay

## 2023-01-13 ENCOUNTER — Encounter: Payer: Self-pay | Admitting: Rheumatology

## 2023-02-15 ENCOUNTER — Encounter: Payer: Self-pay | Admitting: Medical Oncology

## 2023-02-15 ENCOUNTER — Inpatient Hospital Stay: Payer: Medicare Other

## 2023-02-15 ENCOUNTER — Inpatient Hospital Stay: Payer: Medicare Other | Attending: Internal Medicine | Admitting: Internal Medicine

## 2023-02-15 VITALS — BP 123/58 | HR 64 | Temp 98.4°F | Resp 16 | Ht 69.0 in | Wt 190.4 lb

## 2023-02-15 DIAGNOSIS — Z79899 Other long term (current) drug therapy: Secondary | ICD-10-CM | POA: Diagnosis not present

## 2023-02-15 DIAGNOSIS — I10 Essential (primary) hypertension: Secondary | ICD-10-CM | POA: Insufficient documentation

## 2023-02-15 DIAGNOSIS — D61818 Other pancytopenia: Secondary | ICD-10-CM | POA: Insufficient documentation

## 2023-02-15 DIAGNOSIS — D649 Anemia, unspecified: Secondary | ICD-10-CM | POA: Diagnosis not present

## 2023-02-15 DIAGNOSIS — E039 Hypothyroidism, unspecified: Secondary | ICD-10-CM | POA: Insufficient documentation

## 2023-02-15 LAB — CBC WITH DIFFERENTIAL (CANCER CENTER ONLY)
Abs Immature Granulocytes: 0.06 10*3/uL (ref 0.00–0.07)
Basophils Absolute: 0 10*3/uL (ref 0.0–0.1)
Basophils Relative: 1 %
Eosinophils Absolute: 0.1 10*3/uL (ref 0.0–0.5)
Eosinophils Relative: 4 %
HCT: 32.7 % — ABNORMAL LOW (ref 39.0–52.0)
Hemoglobin: 10.6 g/dL — ABNORMAL LOW (ref 13.0–17.0)
Immature Granulocytes: 3 %
Lymphocytes Relative: 36 %
Lymphs Abs: 0.8 10*3/uL (ref 0.7–4.0)
MCH: 32.7 pg (ref 26.0–34.0)
MCHC: 32.4 g/dL (ref 30.0–36.0)
MCV: 100.9 fL — ABNORMAL HIGH (ref 80.0–100.0)
Monocytes Absolute: 0.3 10*3/uL (ref 0.1–1.0)
Monocytes Relative: 12 %
Neutro Abs: 1 10*3/uL — ABNORMAL LOW (ref 1.7–7.7)
Neutrophils Relative %: 44 %
Platelet Count: 63 10*3/uL — ABNORMAL LOW (ref 150–400)
RBC: 3.24 MIL/uL — ABNORMAL LOW (ref 4.22–5.81)
RDW: 17.4 % — ABNORMAL HIGH (ref 11.5–15.5)
WBC Count: 2.2 10*3/uL — ABNORMAL LOW (ref 4.0–10.5)
nRBC: 0 % (ref 0.0–0.2)

## 2023-02-15 LAB — LACTATE DEHYDROGENASE: LDH: 171 U/L (ref 98–192)

## 2023-02-15 NOTE — Progress Notes (Signed)
Labs - Mailed recent labs to pt per his request.

## 2023-02-15 NOTE — Progress Notes (Signed)
[facility] [phone]   [phone]) [phone]  OFFICE PROGRESS NOTE  Georgann Housekeeper, MD [address] [place] [address]  DIAGNOSIS: Pancytopenia of unclear etiology with negative bone marrow biopsy and aspirate.  The findings were suspicious for drug-induced versus autoimmune induced abnormality.  PRIOR THERAPY: None  CURRENT THERAPY: None  INTERVAL HISTORY: Samuel Little 89 y.o. male returns to the clinic today for follow-up visit accompanied by his wife.Discussed the use of AI scribe software for clinical note transcription with the patient, who gave verbal consent to proceed.  History of Present Illness   Samuel Little, an 89 year old patient with a history of rheumatoid factor elevation, gout and pancytopenia, was initially seen several months ago due to low blood counts across the board - white blood cells, hemoglobin, and platelets. Extensive studies, including a bone marrow biopsy and aspirate, did not reveal any clear evidence of leukemia, MDS, or any other bone marrow abnormalities. The results suggested the possibility of drug toxicity or an autoimmune disorder.  In the interim, the patient saw a rheumatologist who adjusted his gout medication. The patient had been on a significant amount of gout medication, including allopurinol for several years, and had experienced multiple gout attacks. The patient also had a history of prednisone use. The patient's platelets had been low a couple of years ago, which led to the discontinuation of allopurinol.  The patient also has some liver disease, likely due to the use of NSAIDs for gout management. The rheumatologist switched the patient's gout medication to a newer drug, which has similar side effects to allopurinol.       MEDICAL HISTORY: 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  , march 2,2011    Arthritis    Diverticulosis    Effusion, pericardium    Gastroesophageal reflux disease    Gout    Hard of hearing    wears bilateral hearing aids   History of kidney stones    Hypertension    Hypothyroidism    Macular degeneration    Pleural effusion, left    Right bundle branch block    Thyroid disease    hypothyroidism   Wears glasses     ALLERGIES:  is allergic to nsaids, tolmetin, celecoxib, and meperidine hcl.  MEDICATIONS:  Current Outpatient Medications  Medication Sig Dispense Refill   acetaminophen (TYLENOL) 650 MG CR tablet Take 1,300 mg every 8 (eight) hours as needed by mouth for pain.      allopurinol (ZYLOPRIM) 100 MG tablet Take 200 mg by mouth daily.     Alpha-Lipoic Acid 100 MG CAPS Take 2 capsules by mouth daily.     Ascorbic Acid (VITAMIN C) 500 MG CAPS Take 500 mg by mouth daily.      Bilberry 100 MG CAPS Take 100 mg daily by mouth.      bisacodyl (DULCOLAX) 5 MG EC tablet Take 10 mg daily as needed by mouth for moderate constipation.     CALCIUM PO Take 1 tablet by mouth daily.     cholecalciferol (VITAMIN D) 1000 units tablet Take 1,000 Units daily by mouth.      Coenzyme Q10 (CO Q 10 PO) Take 120 mg daily by mouth.      colchicine 0.6 MG tablet Take 1 tablet (0.6 mg total) by mouth daily as needed (gout). Please take daily for next  5 days until gout flare is resolved, then as needed (Patient taking differently: Take 0.6 mg by mouth daily as needed (for gout).) 30 tablet 1   CRANBERRY PO Take 500 mg daily by mouth.      Cyanocobalamin (VITAMIN B-12 PO) Take 1 tablet daily by mouth.     doxazosin (CARDURA) 4 MG tablet Take 4 mg by mouth at bedtime.      ELDERBERRY PO Take 1 capsule by mouth daily.     Flaxseed, Linseed, (FLAX SEED OIL PO) Take 1 tablet by mouth daily.     GINKGO BILOBA PO Take 120 mg daily by mouth.      Glucos-Chondroit-Hyaluron-MSM (GLUCOSAMINE CHONDROITIN JOINT PO) Take 2 tablets daily by mouth.       guaiFENesin-dextromethorphan (ROBITUSSIN DM) 100-10 MG/5ML syrup Take 5 mLs by mouth every 4 (four) hours as needed for cough. 118 mL 0   levothyroxine (SYNTHROID) 150 MCG tablet Take 150 mcg by mouth daily before breakfast.     lidocaine (LIDODERM) 5 % Place 1 patch onto the skin daily. Remove & Discard patch within 12 hours or as directed by MD 15 patch 0   lisinopril (PRINIVIL,ZESTRIL) 20 MG tablet Take 1 tablet (20 mg total) by mouth daily. 30 tablet 3   Lutein 6 MG CAPS Take 6 mg daily by mouth.     Omega-3 Fatty Acids (FISH OIL) 1000 MG CAPS Take 1,000 mg daily by mouth.     OVER THE COUNTER MEDICATION Take 2 tablets 2 (two) times daily by mouth. Macular Protect Complete Supplement     oxyCODONE (ROXICODONE) 5 MG immediate release tablet Take 1 tablet (5 mg total) by mouth every 8 (eight) hours as needed. (Patient not taking: Reported on 10/24/2022) 10 tablet 0   polyethylene glycol (MIRALAX / GLYCOLAX) packet Take 17 g daily as needed by mouth for moderate constipation.     RESVERATROL PO Take 1 tablet by mouth daily.      senna (SENOKOT) 8.6 MG TABS tablet Take 1 tablet (8.6 mg total) by mouth at bedtime as needed for mild constipation. (Patient taking differently: Take 1 tablet by mouth daily as needed for mild constipation.) 10 each 0   TART CHERRY PO Take 1 tablet by mouth daily.     Trolamine Salicylate (ASPERCREME EX) Apply 1 application as needed topically (for muscle pain).     Turmeric 500 MG CAPS Take 500 mg 2 (two) times daily by mouth.      No current facility-administered medications for this visit.    SURGICAL HISTORY:  Past Surgical History:  Procedure Laterality Date   ARTHROSCOPY KNEE W/ DRILLING Right    CARDIAC CATHETERIZATION  2007   CARDIAC ELECTROPHYSIOLOGY STUDY AND ABLATION     CATARACT EXTRACTION     CHEST TUBE INSERTION     CHOLECYSTECTOMY  06/26/2016   Procedure: LAPAROSCOPIC CHOLECYSTECTOMY  subtotal;  Surgeon: Almond Lint, MD;  Location: [facility];  Service:  General;;   FRACTURE SURGERY Right 2001   right leg   HERNIA REPAIR     IR THORACENTESIS ASP PLEURAL SPACE W/IMG GUIDE  09/09/2022   MASS EXCISION Right 09/22/2015   Procedure: RIGHT THUMB EXCISION MASS;  Surgeon: Betha Loa, MD;  Location: [facility];  Service: Orthopedics;  Laterality: Right;   ROTATOR CUFF REPAIR     ROTATOR CUFF REPAIR Right 2002   TONSILLECTOMY     TOTAL KNEE ARTHROPLASTY Right 02/13/2017   Procedure: TOTAL KNEE ARTHROPLASTY;  Surgeon: Dannielle Huh,  MD;  Location: [facility];  Service: Orthopedics;  Laterality: Right;    REVIEW OF SYSTEMS:  A comprehensive review of systems was negative except for: Constitutional: positive for fatigue   PHYSICAL EXAMINATION: General appearance: alert, cooperative, fatigued, and no distress Head: Normocephalic, without obvious abnormality, atraumatic Neck: no adenopathy, no JVD, supple, symmetrical, trachea midline, and thyroid not enlarged, symmetric, no tenderness/mass/nodules Lymph nodes: Cervical, supraclavicular, and axillary nodes normal. Resp: clear to auscultation bilaterally Back: symmetric, no curvature. ROM normal. No CVA tenderness. Cardio: regular rate and rhythm, S1, S2 normal, no murmur, click, rub or gallop GI: soft, non-tender; bowel sounds normal; no masses,  no organomegaly Extremities: extremities normal, atraumatic, no cyanosis or edema  ECOG PERFORMANCE STATUS: 1 - Symptomatic but completely ambulatory  Blood pressure (!) 123/58, pulse 64, temperature 98.4 F (36.9 C), temperature source Temporal, resp. rate 16, height 5\' 9"  (1.753 m), weight 190 lb 6.4 oz (86.4 kg), SpO2 98%.  LABORATORY DATA: Lab Results  Component Value Date   WBC 2.6 (L) 11/21/2022   HGB 9.7 (L) 11/21/2022   HCT 30.3 (L) 11/21/2022   MCV 97.1 11/21/2022   PLT 102 (L) 11/21/2022      Chemistry      Component Value Date/Time   NA 139 10/24/2022 1301   K 4.0 10/24/2022 1301   CL 107 10/24/2022 1301   CO2 26 10/24/2022  1301   BUN 13 10/24/2022 1301   CREATININE 0.76 10/24/2022 1301      Component Value Date/Time   CALCIUM 8.8 (L) 10/24/2022 1301   ALKPHOS 226 (H) 10/24/2022 1301   AST 33 10/24/2022 1301   ALT 20 10/24/2022 1301   BILITOT 0.9 10/24/2022 1301       RADIOGRAPHIC STUDIES: No results found.  ASSESSMENT AND PLAN: This is a very pleasant 89 years old white male with pancytopenia likely secondary to medication or autoimmune disorder specially with the elevated rheumatoid factor.  The patient had extensive studies that were unremarkable and his bone marrow biopsy and aspirate performed recently showed no underlying MDS or MPN.  The findings were suspicious to be secondary to drug/toxin or autoimmune disorder.    Pancytopenia Chronic pancytopenia with low WBC, hemoglobin, and platelets. Previous bone marrow biopsy showed no leukemia, MDS, or other abnormalities. Likely drug-induced or autoimmune-related. Current medications include allopurinol, which can cause low blood counts. Stopping allopurinol is not advisable due to gout management. Emphasized close monitoring by family doctor and rheumatologist. - Order CBC and LDH today - Follow up with family doctor and rheumatologist for medication management and monitoring  Gout Chronic gout with frequent attacks, managed with allopurinol and prednisone. Medication recently adjusted by rheumatologist. New medication carries similar pancytopenia risks, requiring close monitoring. - Continue current gout medication as prescribed by rheumatologist - Monitor blood counts closely  Liver Disease Chronic liver disease likely secondary to prolonged NSAID use for gout management. Discussed need to avoid NSAIDs to prevent further liver damage. - Monitor liver function tests regularly - Avoid NSAIDs if possible  Follow-up - Repeat CBC and LDH today - Follow up with family doctor and rheumatologist for ongoing care - Close monitoring of blood counts and  liver function.   The patient was advised to call immediately if he has any other concerning symptoms in the interval.  The patient voices understanding of current disease status and treatment options and is in agreement with the current care plan.  All questions were answered. The patient knows to call the clinic with any  problems, questions or concerns. We can certainly see the patient much sooner if necessary.  The total time spent in the appointment was 20 minutes.  Disclaimer: This note was dictated with voice recognition software. Similar sounding words can inadvertently be transcribed and may not be corrected upon review.

## 2023-04-21 ENCOUNTER — Telehealth: Payer: Self-pay | Admitting: *Deleted

## 2023-04-21 ENCOUNTER — Encounter: Payer: Self-pay | Admitting: Rheumatology

## 2023-04-21 NOTE — Telephone Encounter (Signed)
 Pt wife called stating that pt will be referred back to Dr. Marguerita Shih and needs to be scheduled for labs and office visit. Pt was last seen by Dr.Mohamed on 02/15/23 and referred to Dr. Henrine Logan rheumatology. Messages will be forwarded to provider for review.

## 2023-04-24 ENCOUNTER — Telehealth: Payer: Self-pay | Admitting: Internal Medicine

## 2023-04-24 NOTE — Telephone Encounter (Signed)
 Scheduled appointments per patient request. Patient is aware of the made appointments.

## 2023-04-26 ENCOUNTER — Other Ambulatory Visit: Payer: Self-pay

## 2023-04-26 DIAGNOSIS — D61818 Other pancytopenia: Secondary | ICD-10-CM

## 2023-04-27 ENCOUNTER — Inpatient Hospital Stay: Payer: Medicare Other

## 2023-04-27 ENCOUNTER — Inpatient Hospital Stay: Payer: Medicare Other | Attending: Internal Medicine | Admitting: Internal Medicine

## 2023-04-27 VITALS — BP 130/58 | HR 62 | Temp 98.1°F | Resp 15 | Wt 187.3 lb

## 2023-04-27 DIAGNOSIS — Z79899 Other long term (current) drug therapy: Secondary | ICD-10-CM | POA: Diagnosis not present

## 2023-04-27 DIAGNOSIS — M109 Gout, unspecified: Secondary | ICD-10-CM | POA: Diagnosis not present

## 2023-04-27 DIAGNOSIS — D61818 Other pancytopenia: Secondary | ICD-10-CM

## 2023-04-27 LAB — CBC WITH DIFFERENTIAL (CANCER CENTER ONLY)
Abs Immature Granulocytes: 0.07 10*3/uL (ref 0.00–0.07)
Basophils Absolute: 0 10*3/uL (ref 0.0–0.1)
Basophils Relative: 1 %
Eosinophils Absolute: 0 10*3/uL (ref 0.0–0.5)
Eosinophils Relative: 1 %
HCT: 28.6 % — ABNORMAL LOW (ref 39.0–52.0)
Hemoglobin: 9.7 g/dL — ABNORMAL LOW (ref 13.0–17.0)
Immature Granulocytes: 4 %
Lymphocytes Relative: 51 %
Lymphs Abs: 0.9 10*3/uL (ref 0.7–4.0)
MCH: 33.9 pg (ref 26.0–34.0)
MCHC: 33.9 g/dL (ref 30.0–36.0)
MCV: 100 fL (ref 80.0–100.0)
Monocytes Absolute: 0.2 10*3/uL (ref 0.1–1.0)
Monocytes Relative: 9 %
Neutro Abs: 0.6 10*3/uL — ABNORMAL LOW (ref 1.7–7.7)
Neutrophils Relative %: 34 %
Platelet Count: 51 10*3/uL — ABNORMAL LOW (ref 150–400)
RBC: 2.86 MIL/uL — ABNORMAL LOW (ref 4.22–5.81)
RDW: 18.3 % — ABNORMAL HIGH (ref 11.5–15.5)
WBC Count: 1.8 10*3/uL — ABNORMAL LOW (ref 4.0–10.5)
nRBC: 0 % (ref 0.0–0.2)

## 2023-04-27 LAB — LACTATE DEHYDROGENASE: LDH: 164 U/L (ref 98–192)

## 2023-04-27 NOTE — Progress Notes (Signed)
[facility] [phone]   Fax:([phone]  OFFICE PROGRESS NOTE  Georgann Housekeeper, MD [address] [place] [address]  DIAGNOSIS: Pancytopenia of unclear etiology with negative bone marrow biopsy and aspirate.  The findings were suspicious for drug-induced versus autoimmune induced abnormality.  PRIOR THERAPY: None  CURRENT THERAPY: Prednisone 20 mg p.o. daily  INTERVAL HISTORY: Samuel Little 89 y.o. male returns to the clinic today for follow-up visit accompanied by his wife.Discussed the use of AI scribe software for clinical note transcription with the patient, who gave verbal consent to proceed.  History of Present Illness   Samuel Little is an 89 year old male with cytopenia who presents for follow-up of low blood counts. He is accompanied by his wife. He was referred by a rheumatologist for evaluation of his blood counts.  He has a history of cytopenia with low white blood cell count, low hemoglobin, and low platelets. A previous bone marrow biopsy showed no evidence of leukemia or cancer. His rheumatoid factor was elevated, leading to a referral to a rheumatologist.  Recent blood tests by the rheumatologist showed a white blood cell count of 1.6 and low platelet levels. He was taken off Allopurinol. He started prednisone 10 mg once daily about a week ago.  He experiences frequent urination without pain and denies any respiratory or urinary tract infections. He experienced pain in his lower back and hip after a fall, which improved with tart cherry juice and ice application. No current pain in that area. He denies significant bruising or bleeding, although he bleeds easily with scratches.  His current medications include prednisone 10 mg daily, which he started last Thursday. He also takes supplements, including fish oil, Plaxeda, turmeric, and tart cherry juice for arthritis.  Recent lab results show a slight improvement in his  white blood cell count to 1.8 and a platelet count of 51,000. His hemoglobin is 9.7, slightly lower than the previous 9.8.       MEDICAL HISTORY: 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 , march 2,2011    Arthritis    Diverticulosis    Effusion, pericardium    Gastroesophageal reflux disease    Gout    Hard of hearing    wears bilateral hearing aids   History of kidney stones    Hypertension    Hypothyroidism    Macular degeneration    Pleural effusion, left    Right bundle branch block    Thyroid disease    hypothyroidism   Wears glasses     ALLERGIES:  is allergic to nsaids, tolmetin, celecoxib, and meperidine hcl.  MEDICATIONS:  Current Outpatient Medications  Medication Sig Dispense Refill   acetaminophen (TYLENOL) 650 MG CR tablet Take 1,300 mg every 8 (eight) hours as needed by mouth for pain.      allopurinol (ZYLOPRIM) 100 MG tablet Take 200 mg by mouth daily.     Alpha-Lipoic Acid 100 MG CAPS Take 2 capsules by mouth daily.     Ascorbic Acid (VITAMIN C) 500 MG CAPS Take 500 mg by mouth daily.      Bilberry 100 MG CAPS Take 100 mg daily by mouth.      bisacodyl (DULCOLAX) 5 MG EC tablet Take 10 mg daily as needed by mouth for moderate constipation.     CALCIUM PO Take 1 tablet  by mouth daily.     cholecalciferol (VITAMIN D) 1000 units tablet Take 1,000 Units daily by mouth.      Coenzyme Q10 (CO Q 10 PO) Take 120 mg daily by mouth.      colchicine 0.6 MG tablet Take 1 tablet (0.6 mg total) by mouth daily as needed (gout). Please take daily for next 5 days until gout flare is resolved, then as needed (Patient taking differently: Take 0.6 mg by mouth daily as needed (for gout).) 30 tablet 1   CRANBERRY PO Take 500 mg daily by mouth.      Cyanocobalamin (VITAMIN B-12 PO) Take 1 tablet daily by mouth.     doxazosin (CARDURA) 4 MG tablet Take 4 mg by mouth at  bedtime.      ELDERBERRY PO Take 1 capsule by mouth daily.     Flaxseed, Linseed, (FLAX SEED OIL PO) Take 1 tablet by mouth daily.     GINKGO BILOBA PO Take 120 mg daily by mouth.      Glucos-Chondroit-Hyaluron-MSM (GLUCOSAMINE CHONDROITIN JOINT PO) Take 2 tablets daily by mouth.      guaiFENesin-dextromethorphan (ROBITUSSIN DM) 100-10 MG/5ML syrup Take 5 mLs by mouth every 4 (four) hours as needed for cough. 118 mL 0   levothyroxine (SYNTHROID) 150 MCG tablet Take 150 mcg by mouth daily before breakfast.     lidocaine (LIDODERM) 5 % Place 1 patch onto the skin daily. Remove & Discard patch within 12 hours or as directed by MD 15 patch 0   lisinopril (PRINIVIL,ZESTRIL) 20 MG tablet Take 1 tablet (20 mg total) by mouth daily. 30 tablet 3   Lutein 6 MG CAPS Take 6 mg daily by mouth.     Omega-3 Fatty Acids (FISH OIL) 1000 MG CAPS Take 1,000 mg daily by mouth.     OVER THE COUNTER MEDICATION Take 2 tablets 2 (two) times daily by mouth. Macular Protect Complete Supplement     oxyCODONE (ROXICODONE) 5 MG immediate release tablet Take 1 tablet (5 mg total) by mouth every 8 (eight) hours as needed. (Patient not taking: Reported on 10/24/2022) 10 tablet 0   polyethylene glycol (MIRALAX / GLYCOLAX) packet Take 17 g daily as needed by mouth for moderate constipation.     RESVERATROL PO Take 1 tablet by mouth daily.      senna (SENOKOT) 8.6 MG TABS tablet Take 1 tablet (8.6 mg total) by mouth at bedtime as needed for mild constipation. (Patient taking differently: Take 1 tablet by mouth daily as needed for mild constipation.) 10 each 0   TART CHERRY PO Take 1 tablet by mouth daily.     Trolamine Salicylate (ASPERCREME EX) Apply 1 application as needed topically (for muscle pain).     Turmeric 500 MG CAPS Take 500 mg 2 (two) times daily by mouth.      No current facility-administered medications for this visit.    SURGICAL HISTORY:  Past Surgical History:  Procedure Laterality Date   ARTHROSCOPY KNEE W/  DRILLING Right    CARDIAC CATHETERIZATION  2007   CARDIAC ELECTROPHYSIOLOGY STUDY AND ABLATION     CATARACT EXTRACTION     CHEST TUBE INSERTION     CHOLECYSTECTOMY  06/26/2016   Procedure: LAPAROSCOPIC CHOLECYSTECTOMY  subtotal;  Surgeon: Almond Lint, MD;  Location: [facility];  Service: General;;   FRACTURE SURGERY Right 2001   right leg   HERNIA REPAIR     IR THORACENTESIS ASP PLEURAL SPACE W/IMG GUIDE  09/09/2022   MASS EXCISION  Right 09/22/2015   Procedure: RIGHT THUMB EXCISION MASS;  Surgeon: Betha Loa, MD;  Location: [facility];  Service: Orthopedics;  Laterality: Right;   ROTATOR CUFF REPAIR     ROTATOR CUFF REPAIR Right 2002   TONSILLECTOMY     TOTAL KNEE ARTHROPLASTY Right 02/13/2017   Procedure: TOTAL KNEE ARTHROPLASTY;  Surgeon: Dannielle Huh, MD;  Location: [facility];  Service: Orthopedics;  Laterality: Right;    REVIEW OF SYSTEMS:  Constitutional: positive for fatigue Eyes: negative Ears, nose, mouth, throat, and face: negative Respiratory: negative Cardiovascular: negative Gastrointestinal: negative Genitourinary:negative Integument/breast: negative Hematologic/lymphatic: negative Musculoskeletal:negative Neurological: negative Behavioral/Psych: negative Endocrine: negative Allergic/Immunologic: negative   PHYSICAL EXAMINATION: General appearance: alert, cooperative, fatigued, and no distress Head: Normocephalic, without obvious abnormality, atraumatic Neck: no adenopathy, no JVD, supple, symmetrical, trachea midline, and thyroid not enlarged, symmetric, no tenderness/mass/nodules Lymph nodes: Cervical, supraclavicular, and axillary nodes normal. Resp: clear to auscultation bilaterally Back: symmetric, no curvature. ROM normal. No CVA tenderness. Cardio: regular rate and rhythm, S1, S2 normal, no murmur, click, rub or gallop GI: soft, non-tender; bowel sounds normal; no masses,  no organomegaly Extremities: extremities normal, atraumatic, no cyanosis or  edema Neurologic: Alert and oriented X 3, normal strength and tone. Normal symmetric reflexes. Normal coordination and gait  ECOG PERFORMANCE STATUS: 1 - Symptomatic but completely ambulatory  Blood pressure (!) 130/58, pulse 62, temperature 98.1 F (36.7 C), temperature source Temporal, resp. rate 15, weight 187 lb 4.8 oz (85 kg), SpO2 98%.  LABORATORY DATA: Lab Results  Component Value Date   WBC 1.8 (L) 04/27/2023   HGB 9.7 (L) 04/27/2023   HCT 28.6 (L) 04/27/2023   MCV 100.0 04/27/2023   PLT 51 (L) 04/27/2023      Chemistry      Component Value Date/Time   NA 139 10/24/2022 1301   K 4.0 10/24/2022 1301   CL 107 10/24/2022 1301   CO2 26 10/24/2022 1301   BUN 13 10/24/2022 1301   CREATININE 0.76 10/24/2022 1301      Component Value Date/Time   CALCIUM 8.8 (L) 10/24/2022 1301   ALKPHOS 226 (H) 10/24/2022 1301   AST 33 10/24/2022 1301   ALT 20 10/24/2022 1301   BILITOT 0.9 10/24/2022 1301       RADIOGRAPHIC STUDIES: No results found.  ASSESSMENT AND PLAN: This is a very pleasant 89 years old white male with pancytopenia likely secondary to medication or autoimmune disorder specially with the elevated rheumatoid factor.  The patient had extensive studies that were unremarkable and his bone marrow biopsy and aspirate performed recently showed no underlying MDS or MPN.  The findings were suspicious to be secondary to drug/toxin or autoimmune disorder.     Cytopenia Chronic cytopenia with leukopenia, anemia, and thrombocytopenia. Bone marrow biopsy negative for leukemia or malignancy, suggesting autoimmune or drug-induced etiology. Elevated rheumatoid factor. Currently on prednisone 10 mg daily for one week. No infection or significant bleeding, though minor bleeding with scratches. Recent labs: WBC 1.8, platelets 51,000. Discussed infection risks due to leukopenia and benefits of increasing prednisone for autoimmune management. Emphasized holding all supplements and  non-essential medications to identify potential drug-induced causes. - Increase prednisone to 20 mg daily for two weeks - Repeat blood work in two weeks - Hold all supplements and non-essential medications for two weeks - Refill prednisone as needed - Follow up in two weeks  Gout Gout with recent discontinuation of gout medication. Reports hip pain, improved with tart cherry juice and ice. Discussed  potential impact of gout medication on blood counts and need to avoid temporarily. - Monitor for gout symptoms - Avoid gout medication for two weeks during medication review  General Health Maintenance 89 year old male on multiple supplements (fish oil, turmeric, multivitamins). Discussed potential impact on blood counts and need to review and discontinue non-essential items. - Discontinue non-essential supplements for two weeks - Discuss medication and supplement list with family doctor to remove non-essential items  Follow-up - Follow up in two weeks for repeat blood work and reassessment.   The patient was advised to call immediately if he has any other concerning symptoms in the interval. The patient voices understanding of current disease status and treatment options and is in agreement with the current care plan.  All questions were answered. The patient knows to call the clinic with any problems, questions or concerns. We can certainly see the patient much sooner if necessary.  The total time spent in the appointment was 30 minutes.  Disclaimer: This note was dictated with voice recognition software. Similar sounding words can inadvertently be transcribed and may not be corrected upon review.

## 2023-05-05 ENCOUNTER — Telehealth: Payer: Self-pay | Admitting: Medical Oncology

## 2023-05-05 ENCOUNTER — Other Ambulatory Visit: Payer: Self-pay | Admitting: Medical Oncology

## 2023-05-05 DIAGNOSIS — D61818 Other pancytopenia: Secondary | ICD-10-CM

## 2023-05-05 MED ORDER — PREDNISONE 10 MG PO TABS: 20.0000 mg | ORAL_TABLET | Freq: Every day | ORAL | 0 refills | Status: DC

## 2023-05-05 NOTE — Progress Notes (Signed)
 Samuel Little

## 2023-05-05 NOTE — Telephone Encounter (Signed)
 Prednisone dose clarification. Per Dr. Asa Lente note wife was informed to have pt increase Prednisone to 20 mg/daily x 2 weeks. Rx sent to pt preferred pharmacy.

## 2023-05-08 NOTE — Progress Notes (Unsigned)
 [facility] OFFICE PROGRESS NOTE  Georgann Housekeeper, MD 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 The findings were suspicious for drug-induced versus autoimmune induced abnormality. he patient had extensive studies that were unremarkable and his bone marrow biopsy and aspirate performed recently showed no underlying MDS or MPN. The findings were suspicious to be secondary to drug/toxin or autoimmune disorder.   PRIOR THERAPY: None  CURRENT THERAPY: Prednisone 20 mg p.o. daily   INTERVAL HISTORY: Samuel Little 89 y.o. male returns to the clinic today for follow-up visit accompanied by***.  Patient is being followed for pancytopenia.  The patient was last seen by Dr. Arbutus Ped on 04/27/2023.  Dr. Arbutus Ped placed the patient on 20 mg of prednisone due to suspicious autoimmune mediated abnormality.  Patient is tolerating his prednisone ***.  Since last being seen he denies any major changes in his health.  Dr. Arbutus Ped also recommended that he stop taking any unnecessary medications.  The patient denies any recent fever, chills, night sweats, lymphadenopathy, unexplained weight loss, sore throat, skin infections, dysuria, abdominal pain, cough, or shortness of breath.  He denies any abnormal bleeding or bruising.  He denies any early satiety or bloating.  Frequent urination?  Low back and hip pain after a fall.  The patient is here today for evaluation and repeat blood work.   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  MEDICATIONS:  Current Outpatient Medications  Medication Sig Dispense Refill   acetaminophen (TYLENOL) 650 MG CR tablet Take 1,300 mg every 8 (eight) hours as needed by mouth for pain.      allopurinol (ZYLOPRIM) 100 MG tablet Take 200 mg by mouth daily.     Alpha-Lipoic Acid 100 MG CAPS Take 2 capsules by mouth daily.     Ascorbic Acid (VITAMIN C) 500 MG CAPS Take 500 mg by mouth daily.      Bilberry 100 MG CAPS Take 100 mg daily by mouth.      bisacodyl (DULCOLAX) 5 MG EC tablet Take 10 mg daily as needed by mouth for moderate constipation.     CALCIUM PO Take 1 tablet by mouth daily.     cholecalciferol (VITAMIN D) 1000 units tablet Take 1,000 Units daily by mouth.      Coenzyme Q10 (CO Q 10 PO) Take 120 mg daily by mouth.      colchicine 0.6 MG tablet Take 1 tablet (0.6 mg total) by mouth daily as needed (gout). Please take daily for next 5 days until gout flare is resolved, then as needed (Patient taking differently: Take 0.6 mg by mouth daily as needed (for gout).) 30 tablet 1   CRANBERRY PO Take 500 mg daily by  mouth.      Cyanocobalamin (VITAMIN B-12 PO) Take 1 tablet daily by mouth.     doxazosin (CARDURA) 4 MG tablet Take 4 mg by mouth at bedtime.      ELDERBERRY PO Take 1 capsule by mouth daily.     Flaxseed, Linseed, (FLAX SEED OIL PO) Take 1 tablet by mouth daily.     GINKGO BILOBA PO Take 120 mg daily by mouth.      Glucos-Chondroit-Hyaluron-MSM (GLUCOSAMINE CHONDROITIN JOINT PO) Take 2 tablets daily by mouth.      guaiFENesin-dextromethorphan (ROBITUSSIN DM) 100-10 MG/5ML syrup Take 5 mLs by mouth every 4 (four) hours as needed for cough. 118 mL 0   levothyroxine (SYNTHROID) 150 MCG tablet Take 150 mcg by mouth daily before breakfast.      lidocaine (LIDODERM) 5 % Place 1 patch onto the skin daily. Remove & Discard patch within 12 hours or as directed by MD 15 patch 0   lisinopril (PRINIVIL,ZESTRIL) 20 MG tablet Take 1 tablet (20 mg total) by mouth daily. 30 tablet 3   Lutein 6 MG CAPS Take 6 mg daily by mouth.     Omega-3 Fatty Acids (FISH OIL) 1000 MG CAPS Take 1,000 mg daily by mouth.     OVER THE COUNTER MEDICATION Take 2 tablets 2 (two) times daily by mouth. Macular Protect Complete Supplement     oxyCODONE (ROXICODONE) 5 MG immediate release tablet Take 1 tablet (5 mg total) by mouth every 8 (eight) hours as needed. (Patient not taking: Reported on 10/24/2022) 10 tablet 0   polyethylene glycol (MIRALAX / GLYCOLAX) packet Take 17 g daily as needed by mouth for moderate constipation.     predniSONE (DELTASONE) 10 MG tablet Take 10 mg by mouth daily.     predniSONE (DELTASONE) 10 MG tablet Take 2 tablets (20 mg total) by mouth daily with breakfast. Take 2 tablets daily x 2 weeks 30 tablet 0   RESVERATROL PO Take 1 tablet by mouth daily.      senna (SENOKOT) 8.6 MG TABS tablet Take 1 tablet (8.6 mg total) by mouth at bedtime as needed for mild constipation. (Patient taking differently: Take 1 tablet by mouth daily as needed for mild constipation.) 10 each 0   TART CHERRY PO Take 1 tablet by mouth daily.     Trolamine Salicylate (ASPERCREME EX) Apply 1 application as needed topically (for muscle pain).     Turmeric 500 MG CAPS Take 500 mg 2 (two) times daily by mouth.      No current facility-administered medications for this visit.    SURGICAL HISTORY:  Past Surgical History:  Procedure Laterality Date   ARTHROSCOPY KNEE W/ DRILLING Right    CARDIAC CATHETERIZATION  2007   CARDIAC ELECTROPHYSIOLOGY STUDY AND ABLATION     CATARACT EXTRACTION     CHEST TUBE INSERTION     CHOLECYSTECTOMY  06/26/2016   Procedure: LAPAROSCOPIC CHOLECYSTECTOMY  subtotal;  Surgeon: Almond Lint, MD;  Location: [facility] OR;  Service: General;;   FRACTURE  SURGERY Right 2001   right leg   HERNIA REPAIR     IR THORACENTESIS ASP PLEURAL SPACE W/IMG GUIDE  09/09/2022   MASS EXCISION Right 09/22/2015   Procedure: RIGHT THUMB EXCISION MASS;  Surgeon: Betha Loa, MD;  Location: [facility];  Service: Orthopedics;  Laterality: Right;   ROTATOR CUFF REPAIR     ROTATOR CUFF REPAIR Right 2002   TONSILLECTOMY     TOTAL KNEE ARTHROPLASTY Right 02/13/2017  Procedure: TOTAL KNEE ARTHROPLASTY;  Surgeon: Dannielle Huh, MD;  Location: [facility] OR;  Service: Orthopedics;  Laterality: Right;    REVIEW OF SYSTEMS:   Review of Systems  Constitutional: Negative for appetite change, chills, fatigue, fever and unexpected weight change.  HENT:   Negative for mouth sores, nosebleeds, sore throat and trouble swallowing.   Eyes: Negative for eye problems and icterus.  Respiratory: Negative for cough, hemoptysis, shortness of breath and wheezing.   Cardiovascular: Negative for chest pain and leg swelling.  Gastrointestinal: Negative for abdominal pain, constipation, diarrhea, nausea and vomiting.  Genitourinary: Negative for bladder incontinence, difficulty urinating, dysuria, frequency and hematuria.   Musculoskeletal: Negative for back pain, gait problem, neck pain and neck stiffness.  Skin: Negative for itching and rash.  Neurological: Negative for dizziness, extremity weakness, gait problem, headaches, light-headedness and seizures.  Hematological: Negative for adenopathy. Does not bruise/bleed easily.  Psychiatric/Behavioral: Negative for confusion, depression and sleep disturbance. The patient is not nervous/anxious.     PHYSICAL EXAMINATION:  There were no vitals taken for this visit.  ECOG PERFORMANCE STATUS: {CHL ONC ECOG [id number]  Physical Exam  Constitutional: Oriented to person, place, and time and well-developed, well-nourished, and in no distress. No distress.  HENT:  Head: Normocephalic and atraumatic.  Mouth/Throat: Oropharynx is  clear and moist. No oropharyngeal exudate.  Eyes: Conjunctivae are normal. Right eye exhibits no discharge. Left eye exhibits no discharge. No scleral icterus.  Neck: Normal range of motion. Neck supple.  Cardiovascular: Normal rate, regular rhythm, normal heart sounds and intact distal pulses.   Pulmonary/Chest: Effort normal and breath sounds normal. No respiratory distress. No wheezes. No rales.  Abdominal: Soft. Bowel sounds are normal. Exhibits no distension and no mass. There is no tenderness.  Musculoskeletal: Normal range of motion. Exhibits no edema.  Lymphadenopathy:    No cervical adenopathy.  Neurological: Alert and oriented to person, place, and time. Exhibits normal muscle tone. Gait normal. Coordination normal.  Skin: Skin is warm and dry. No rash noted. Not diaphoretic. No erythema. No pallor.  Psychiatric: Mood, memory and judgment normal.  Vitals reviewed.  LABORATORY DATA: Lab Results  Component Value Date   WBC 1.8 (L) 04/27/2023   HGB 9.7 (L) 04/27/2023   HCT 28.6 (L) 04/27/2023   MCV 100.0 04/27/2023   PLT 51 (L) 04/27/2023      Chemistry      Component Value Date/Time   NA 139 10/24/2022 1301   K 4.0 10/24/2022 1301   CL 107 10/24/2022 1301   CO2 26 10/24/2022 1301   BUN 13 10/24/2022 1301   CREATININE 0.76 10/24/2022 1301      Component Value Date/Time   CALCIUM 8.8 (L) 10/24/2022 1301   ALKPHOS 226 (H) 10/24/2022 1301   AST 33 10/24/2022 1301   ALT 20 10/24/2022 1301   BILITOT 0.9 10/24/2022 1301       RADIOGRAPHIC STUDIES:  No results found.   ASSESSMENT/PLAN:  This is a very pleasant 89 year old Caucasian male with pancytopenia likely secondary to medication or autoimmune disorder specially with the elevated rheumatoid factor. The patient had extensive studies that were unremarkable and his bone marrow biopsy and aspirate performed recently showed no underlying MDS or MPN. The findings were suspicious to be secondary to drug/toxin or  autoimmune disorder.   The patient is currently on 20 mg of prednisone p.o. daily.  The patient was seen with Dr. Arbutus Ped today.  He had a repeat lab work performed that shows ***  Dr. Arbutus Ped recommends ***  I will arrange for ***  Prescriptions?***  Will see the patient back for follow-up visit in ***for evaluation and repeat blood work  The patient will continue to hold any nonessential medications  The patient was advised to call immediately if he has any concerning symptoms in the interval. The patient voices understanding of current disease status and treatment options and is in agreement with the current care plan. All questions were answered. The patient knows to call the clinic with any problems, questions or concerns. We can certainly see the patient much sooner if necessary    No orders of the defined types were placed in this encounter.    I spent {CHL ONC TIME VISIT - ZOXWR:[id number]} counseling the patient face to face. The total time spent in the appointment was {CHL ONC TIME VISIT - BJYNW:[id number]}.  My Madariaga L Aahna Rossa, PA-C 05/08/23

## 2023-05-11 ENCOUNTER — Telehealth: Payer: Self-pay

## 2023-05-11 ENCOUNTER — Inpatient Hospital Stay: Payer: Medicare Other

## 2023-05-11 ENCOUNTER — Telehealth: Payer: Self-pay | Admitting: Internal Medicine

## 2023-05-11 ENCOUNTER — Inpatient Hospital Stay (HOSPITAL_BASED_OUTPATIENT_CLINIC_OR_DEPARTMENT_OTHER): Payer: Medicare Other | Admitting: Physician Assistant

## 2023-05-11 VITALS — BP 135/62 | HR 97 | Temp 98.3°F | Resp 15 | Wt 191.0 lb

## 2023-05-11 DIAGNOSIS — D61818 Other pancytopenia: Secondary | ICD-10-CM

## 2023-05-11 DIAGNOSIS — N3000 Acute cystitis without hematuria: Secondary | ICD-10-CM

## 2023-05-11 LAB — CMP (CANCER CENTER ONLY)
ALT: 36 U/L (ref 0–44)
AST: 29 U/L (ref 15–41)
Albumin: 3.4 g/dL — ABNORMAL LOW (ref 3.5–5.0)
Alkaline Phosphatase: 179 U/L — ABNORMAL HIGH (ref 38–126)
Anion gap: 5 (ref 5–15)
BUN: 21 mg/dL (ref 8–23)
CO2: 28 mmol/L (ref 22–32)
Calcium: 8.9 mg/dL (ref 8.9–10.3)
Chloride: 109 mmol/L (ref 98–111)
Creatinine: 0.89 mg/dL (ref 0.61–1.24)
GFR, Estimated: >60 mL/min (ref >60)
Glucose, Bld: 93 mg/dL (ref 70–99)
Potassium: 3.6 mmol/L (ref 3.5–5.1)
Sodium: 142 mmol/L (ref 135–145)
Total Bilirubin: 1.2 mg/dL (ref 0.0–1.2)
Total Protein: 6.2 g/dL — ABNORMAL LOW (ref 6.5–8.1)

## 2023-05-11 LAB — URINALYSIS, COMPLETE (UACMP) WITH MICROSCOPIC
Bilirubin Urine: NEGATIVE
Glucose, UA: NEGATIVE mg/dL
Ketones, ur: NEGATIVE mg/dL
Nitrite: NEGATIVE
Protein, ur: NEGATIVE mg/dL
Specific Gravity, Urine: 1.012 (ref 1.005–1.030)
WBC, UA: >50 WBC/hpf (ref 0–5)
pH: 5 (ref 5.0–8.0)

## 2023-05-11 LAB — LACTATE DEHYDROGENASE: LDH: 160 U/L (ref 98–192)

## 2023-05-11 LAB — CBC WITH DIFFERENTIAL (CANCER CENTER ONLY)
Abs Immature Granulocytes: 0.1 10*3/uL — ABNORMAL HIGH (ref 0.00–0.07)
Basophils Absolute: 0 10*3/uL (ref 0.0–0.1)
Basophils Relative: 0 %
Eosinophils Absolute: 0 10*3/uL (ref 0.0–0.5)
Eosinophils Relative: 0 %
HCT: 28.2 % — ABNORMAL LOW (ref 39.0–52.0)
Hemoglobin: 9.3 g/dL — ABNORMAL LOW (ref 13.0–17.0)
Immature Granulocytes: 4 %
Lymphocytes Relative: 39 %
Lymphs Abs: 1 10*3/uL (ref 0.7–4.0)
MCH: 33.3 pg (ref 26.0–34.0)
MCHC: 33 g/dL (ref 30.0–36.0)
MCV: 101.1 fL — ABNORMAL HIGH (ref 80.0–100.0)
Monocytes Absolute: 0.2 10*3/uL (ref 0.1–1.0)
Monocytes Relative: 9 %
Neutro Abs: 1.3 10*3/uL — ABNORMAL LOW (ref 1.7–7.7)
Neutrophils Relative %: 48 %
Platelet Count: 43 10*3/uL — ABNORMAL LOW (ref 150–400)
RBC: 2.79 MIL/uL — ABNORMAL LOW (ref 4.22–5.81)
RDW: 20.2 % — ABNORMAL HIGH (ref 11.5–15.5)
WBC Count: 2.6 10*3/uL — ABNORMAL LOW (ref 4.0–10.5)
nRBC: 0.8 % — ABNORMAL HIGH (ref 0.0–0.2)

## 2023-05-11 MED ORDER — PREDNISONE 10 MG PO TABS: ORAL_TABLET | ORAL | 1 refills | Status: DC

## 2023-05-11 MED ORDER — NITROFURANTOIN MONOHYD MACRO 100 MG PO CAPS: 100.0000 mg | ORAL_CAPSULE | Freq: Two times a day (BID) | ORAL | 0 refills | Status: DC

## 2023-05-11 NOTE — Telephone Encounter (Signed)
 Spoke with patients wife in regards to urinalysis.  Per Cassie, PA- Sent in antibiotics for UTI.  Informed wife urine culture has not resulted.  Will call with updated results. Wife verbalized understanding.

## 2023-05-11 NOTE — Telephone Encounter (Signed)
 Scheduled appointments per 2/27 LOS notes. Left the patient a voicemail with the appointment details. Will be mailed an appointment reminder.

## 2023-05-13 LAB — URINE CULTURE: Culture: 80000 — AB

## 2023-05-22 ENCOUNTER — Emergency Department (HOSPITAL_COMMUNITY)

## 2023-05-22 ENCOUNTER — Other Ambulatory Visit: Payer: Self-pay

## 2023-05-22 ENCOUNTER — Observation Stay (HOSPITAL_COMMUNITY)

## 2023-05-22 ENCOUNTER — Inpatient Hospital Stay (HOSPITAL_COMMUNITY)
Admission: EM | Admit: 2023-05-22 | Discharge: 2023-05-29 | DRG: 690 | Disposition: A | Discharge status: Skilled Nursing Facility | Attending: Internal Medicine | Admitting: Internal Medicine

## 2023-05-22 ENCOUNTER — Encounter (HOSPITAL_COMMUNITY): Payer: Self-pay | Admitting: *Deleted

## 2023-05-22 DIAGNOSIS — E876 Hypokalemia: Secondary | ICD-10-CM | POA: Diagnosis present

## 2023-05-22 DIAGNOSIS — Z9049 Acquired absence of other specified parts of digestive tract: Secondary | ICD-10-CM

## 2023-05-22 DIAGNOSIS — H3530 Unspecified macular degeneration: Secondary | ICD-10-CM | POA: Diagnosis present

## 2023-05-22 DIAGNOSIS — R54 Age-related physical debility: Secondary | ICD-10-CM | POA: Diagnosis present

## 2023-05-22 DIAGNOSIS — K449 Diaphragmatic hernia without obstruction or gangrene: Secondary | ICD-10-CM | POA: Diagnosis present

## 2023-05-22 DIAGNOSIS — J90 Pleural effusion, not elsewhere classified: Secondary | ICD-10-CM | POA: Diagnosis present

## 2023-05-22 DIAGNOSIS — Z79899 Other long term (current) drug therapy: Secondary | ICD-10-CM

## 2023-05-22 DIAGNOSIS — D696 Thrombocytopenia, unspecified: Secondary | ICD-10-CM

## 2023-05-22 DIAGNOSIS — M109 Gout, unspecified: Secondary | ICD-10-CM | POA: Diagnosis present

## 2023-05-22 DIAGNOSIS — M4807 Spinal stenosis, lumbosacral region: Secondary | ICD-10-CM | POA: Diagnosis present

## 2023-05-22 DIAGNOSIS — H9190 Unspecified hearing loss, unspecified ear: Secondary | ICD-10-CM | POA: Diagnosis present

## 2023-05-22 DIAGNOSIS — Z87440 Personal history of urinary (tract) infections: Secondary | ICD-10-CM

## 2023-05-22 DIAGNOSIS — Z833 Family history of diabetes mellitus: Secondary | ICD-10-CM

## 2023-05-22 DIAGNOSIS — D61818 Other pancytopenia: Secondary | ICD-10-CM | POA: Diagnosis present

## 2023-05-22 DIAGNOSIS — E039 Hypothyroidism, unspecified: Secondary | ICD-10-CM | POA: Diagnosis present

## 2023-05-22 DIAGNOSIS — Z8249 Family history of ischemic heart disease and other diseases of the circulatory system: Secondary | ICD-10-CM

## 2023-05-22 DIAGNOSIS — R161 Splenomegaly, not elsewhere classified: Secondary | ICD-10-CM | POA: Diagnosis present

## 2023-05-22 DIAGNOSIS — D539 Nutritional anemia, unspecified: Secondary | ICD-10-CM | POA: Diagnosis present

## 2023-05-22 DIAGNOSIS — N400 Enlarged prostate without lower urinary tract symptoms: Secondary | ICD-10-CM | POA: Diagnosis present

## 2023-05-22 DIAGNOSIS — B9561 Methicillin susceptible Staphylococcus aureus infection as the cause of diseases classified elsewhere: Secondary | ICD-10-CM | POA: Diagnosis present

## 2023-05-22 DIAGNOSIS — I10 Essential (primary) hypertension: Secondary | ICD-10-CM | POA: Diagnosis present

## 2023-05-22 DIAGNOSIS — M25562 Pain in left knee: Secondary | ICD-10-CM | POA: Diagnosis not present

## 2023-05-22 DIAGNOSIS — M545 Low back pain, unspecified: Secondary | ICD-10-CM

## 2023-05-22 DIAGNOSIS — K4020 Bilateral inguinal hernia, without obstruction or gangrene, not specified as recurrent: Secondary | ICD-10-CM | POA: Diagnosis present

## 2023-05-22 DIAGNOSIS — N3001 Acute cystitis with hematuria: Secondary | ICD-10-CM | POA: Diagnosis not present

## 2023-05-22 DIAGNOSIS — K5730 Diverticulosis of large intestine without perforation or abscess without bleeding: Secondary | ICD-10-CM | POA: Diagnosis present

## 2023-05-22 DIAGNOSIS — Z87442 Personal history of urinary calculi: Secondary | ICD-10-CM

## 2023-05-22 DIAGNOSIS — Z888 Allergy status to other drugs, medicaments and biological substances status: Secondary | ICD-10-CM

## 2023-05-22 DIAGNOSIS — Z974 Presence of external hearing-aid: Secondary | ICD-10-CM

## 2023-05-22 DIAGNOSIS — N179 Acute kidney failure, unspecified: Secondary | ICD-10-CM | POA: Diagnosis present

## 2023-05-22 DIAGNOSIS — Z515 Encounter for palliative care: Secondary | ICD-10-CM

## 2023-05-22 DIAGNOSIS — N390 Urinary tract infection, site not specified: Secondary | ICD-10-CM | POA: Diagnosis not present

## 2023-05-22 DIAGNOSIS — Z66 Do not resuscitate: Secondary | ICD-10-CM | POA: Diagnosis present

## 2023-05-22 DIAGNOSIS — Z79890 Hormone replacement therapy (postmenopausal): Secondary | ICD-10-CM

## 2023-05-22 DIAGNOSIS — R7881 Bacteremia: Secondary | ICD-10-CM | POA: Diagnosis present

## 2023-05-22 DIAGNOSIS — R739 Hyperglycemia, unspecified: Secondary | ICD-10-CM | POA: Diagnosis present

## 2023-05-22 DIAGNOSIS — M48062 Spinal stenosis, lumbar region with neurogenic claudication: Secondary | ICD-10-CM | POA: Diagnosis present

## 2023-05-22 DIAGNOSIS — N200 Calculus of kidney: Secondary | ICD-10-CM | POA: Diagnosis present

## 2023-05-22 DIAGNOSIS — Z96651 Presence of right artificial knee joint: Secondary | ICD-10-CM | POA: Diagnosis present

## 2023-05-22 LAB — URINALYSIS, W/ REFLEX TO CULTURE (INFECTION SUSPECTED)
Bilirubin Urine: NEGATIVE
Glucose, UA: NEGATIVE mg/dL
Ketones, ur: NEGATIVE mg/dL
Nitrite: NEGATIVE
Protein, ur: NEGATIVE mg/dL
Specific Gravity, Urine: 1.012 (ref 1.005–1.030)
WBC, UA: >50 WBC/hpf (ref 0–5)
pH: 5 (ref 5.0–8.0)

## 2023-05-22 LAB — CBC
HCT: 27.9 % — ABNORMAL LOW (ref 39.0–52.0)
Hemoglobin: 9.2 g/dL — ABNORMAL LOW (ref 13.0–17.0)
MCH: 33.9 pg (ref 26.0–34.0)
MCHC: 33 g/dL (ref 30.0–36.0)
MCV: 103 fL — ABNORMAL HIGH (ref 80.0–100.0)
Platelets: 31 10*3/uL — ABNORMAL LOW (ref 150–400)
RBC: 2.71 MIL/uL — ABNORMAL LOW (ref 4.22–5.81)
RDW: 21.8 % — ABNORMAL HIGH (ref 11.5–15.5)
WBC: 9.4 10*3/uL (ref 4.0–10.5)
nRBC: 1.1 % — ABNORMAL HIGH (ref 0.0–0.2)

## 2023-05-22 LAB — BASIC METABOLIC PANEL
Anion gap: 12 (ref 5–15)
BUN: 26 mg/dL — ABNORMAL HIGH (ref 8–23)
CO2: 21 mmol/L — ABNORMAL LOW (ref 22–32)
Calcium: 8.5 mg/dL — ABNORMAL LOW (ref 8.9–10.3)
Chloride: 102 mmol/L (ref 98–111)
Creatinine, Ser: 1.3 mg/dL — ABNORMAL HIGH (ref 0.61–1.24)
GFR, Estimated: 53 mL/min — ABNORMAL LOW (ref >60)
Glucose, Bld: 103 mg/dL — ABNORMAL HIGH (ref 70–99)
Potassium: 3.4 mmol/L — ABNORMAL LOW (ref 3.5–5.1)
Sodium: 135 mmol/L (ref 135–145)

## 2023-05-22 MED ORDER — LACTATED RINGERS IV BOLUS
1000.0000 mL | Freq: Once | INTRAVENOUS | Status: AC
  Administered 2023-05-23: 1000 mL via INTRAVENOUS

## 2023-05-22 MED ORDER — SODIUM CHLORIDE 0.9 % IV SOLN: 1.0000 g | Freq: Once | INTRAVENOUS | Status: DC

## 2023-05-22 MED ORDER — OXYCODONE-ACETAMINOPHEN 5-325 MG PO TABS
1.0000 | ORAL_TABLET | Freq: Once | ORAL | Status: AC
  Administered 2023-05-22: 1 via ORAL
  Filled 2023-05-22: qty 1

## 2023-05-22 MED ORDER — LEVOTHYROXINE SODIUM 75 MCG PO TABS
150.0000 ug | ORAL_TABLET | Freq: Every day | ORAL | Status: DC
  Administered 2023-05-23 – 2023-05-29 (×7): 150 ug via ORAL
  Filled 2023-05-22 (×8): qty 2

## 2023-05-22 MED ORDER — VANCOMYCIN HCL 1500 MG/300ML IV SOLN
1500.0000 mg | Freq: Once | INTRAVENOUS | Status: DC
  Filled 2023-05-22: qty 300

## 2023-05-22 MED ORDER — ACETAMINOPHEN 325 MG PO TABS
650.0000 mg | ORAL_TABLET | Freq: Four times a day (QID) | ORAL | Status: DC | PRN
  Administered 2023-05-23 – 2023-05-28 (×5): 650 mg via ORAL
  Filled 2023-05-22 (×5): qty 2

## 2023-05-22 MED ORDER — SODIUM CHLORIDE 0.9 % IV SOLN
2.0000 g | INTRAVENOUS | Status: DC
  Administered 2023-05-23: 2 g via INTRAVENOUS
  Filled 2023-05-22: qty 20

## 2023-05-22 MED ORDER — LIDOCAINE 5 % EX PTCH
1.0000 | MEDICATED_PATCH | CUTANEOUS | Status: DC
  Administered 2023-05-22 – 2023-05-24 (×3): 1 via TRANSDERMAL
  Filled 2023-05-22 (×3): qty 1

## 2023-05-22 MED ORDER — OXYCODONE HCL 5 MG PO TABS
5.0000 mg | ORAL_TABLET | ORAL | Status: DC | PRN
  Administered 2023-05-23 – 2023-05-29 (×25): 5 mg via ORAL
  Filled 2023-05-22 (×25): qty 1

## 2023-05-22 MED ORDER — ACETAMINOPHEN 650 MG RE SUPP: 650.0000 mg | Freq: Four times a day (QID) | RECTAL | Status: DC | PRN

## 2023-05-22 MED ORDER — POTASSIUM CHLORIDE CRYS ER 20 MEQ PO TBCR
40.0000 meq | EXTENDED_RELEASE_TABLET | Freq: Once | ORAL | Status: AC
  Administered 2023-05-23: 40 meq via ORAL
  Filled 2023-05-22: qty 2

## 2023-05-22 MED ORDER — PREDNISONE 10 MG PO TABS
20.0000 mg | ORAL_TABLET | Freq: Every day | ORAL | Status: DC
  Administered 2023-05-23 – 2023-05-29 (×7): 20 mg via ORAL
  Filled 2023-05-22 (×7): qty 2

## 2023-05-22 NOTE — ED Provider Triage Note (Signed)
 Emergency Medicine Provider Triage Evaluation Note  Samuel Little , a 89 y.o. male  was evaluated in triage.  Pt complains of low back pain.  Predominantly central at about the low lumbar high sacral area.  Patient reports that he has had a urinary tract infection has been treated for.  He denies he is having ongoing pain or burning but had some previously.  Patient reports that he started getting back pain that was severe enough over the past day and then today that is difficult for him to walk.  He reports pain does radiate into his legs but has a history of spinal stenosis with pain radiation and weakness to the legs..  Review of Systems  Positive: Back pain Negative: Fever  Physical Exam  BP (!) 119/42 (BP Location: Left Arm)   Pulse 81   Temp 99.4 F (37.4 C) (Oral)   Resp 16   SpO2 96%  Gen:   Awake, no distress patient sitting in the wheelchair.  No acute distress. Resp:  Normal effort clear to auscultation. MSK:   Moves extremities without difficulty in seated position patient is able to spontaneously move bilateral lower extremities. Other: Extremities are warm and dry.  Medical Decision Making  Medically screening exam initiated at 1:30 PM.  Appropriate orders placed.  Ravon Quentin Cornwall was informed that the remainder of the evaluation will be completed by another provider, this initial triage assessment does not replace that evaluation, and the importance of remaining in the ED until their evaluation is complete.     Arby Barrette, MD 05/22/23 1332

## 2023-05-22 NOTE — Progress Notes (Deleted)
 ED Pharmacy Antibiotic Sign Off An antibiotic consult was received from an ED provider for vancomycin per pharmacy dosing for hx of recent MSSA in UCX, presenting with flank pain. A chart review was completed to assess appropriateness.   The following one time order(s) were placed:  Vancomycin 1500mg  x1   Further antibiotic and/or antibiotic pharmacy consults should be ordered by the admitting provider if indicated.   Thank you for allowing pharmacy to be a part of this patient's care.   Estill Batten, PharmD, BCCCP  Clinical Pharmacist 05/22/23 9:51 PM

## 2023-05-22 NOTE — H&P (Signed)
 History and Physical    Patient: Samuel Little ZOX:[id number] DOB: Nov 02, 1932 DOA: 05/22/2023 DOS: the patient was seen and examined on 05/22/2023 PCP: Georgann Housekeeper, MD  Patient coming from: Home  Chief Complaint:  Chief Complaint  Patient presents with   Back Pain   Hematuria   HPI: Samuel Little is a 89 y.o. male with medical history significant of hypertension, hypothyroidism, pancytopenia, gout, lumbar spinal stenosis with neurogenic claudication presenting to the ED with lower back pain and hematuria.  Patient is very hard of hearing.  Wife assisted with providing relevant history.  Wife states that patient was in his usual state of health until last night when he began having left lower back/flank pain.  Patient states that the pain remained in his left lower back without radiation down his lower extremities.  Today, patient and wife noticed that patient had 2 episodes of gross hematuria.  Patient does report burning with urination and slight hesitancy in his stream.  Denies any recent fevers, chills, nausea, vomiting, chest pain, palpitations, shortness of breath, abdominal pain.  Denies any loss of sensation, urinary incontinence, fecal incontinence.  Of note, patient was evaluated for UTI 2 weeks ago and found to have MSSA on urine cultures.  He was treated with a 10-day course of Macrobid which she finished a couple of days ago.  ED course: Vital signs stable.  CBC with WBC 9.4 (baseline around 2.5), hemoglobin 9.2 (around baseline), platelet 31 (downtrending over past 6 months).  BMP with mild hypokalemia, mild hyperglycemia, creatinine 1.3 (baseline around 0.8).  Urinalysis with moderate hematuria, moderate leukocytes, greater than 50 WBCs, few bacteria.  Urine culture pending.  Blood cultures x 2 ordered by ED provider.  CT renal stone study showing bilateral nonobstructing intrarenal calculi without any hydronephrosis along with diffuse degenerative changes throughout the spine  without any acute osseous findings.  Patient started on IV ceftriaxone empirically for UTI.  [facility] hospitalist asked to evaluate patient for admission.   Review of Systems: As mentioned in the history of present illness. All other systems reviewed and are negative. 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 , march 2,2011    Arthritis    Diverticulosis    Effusion, pericardium    Gastroesophageal reflux disease    Gout    Hard of hearing    wears bilateral hearing aids   History of kidney stones    Hypertension    Hypothyroidism    Macular degeneration    Pleural effusion, left    Right bundle branch block    Thyroid disease    hypothyroidism   Wears glasses    Past Surgical History:  Procedure Laterality Date   ARTHROSCOPY KNEE W/ DRILLING Right    CARDIAC CATHETERIZATION  2007   CARDIAC ELECTROPHYSIOLOGY STUDY AND ABLATION     CATARACT EXTRACTION     CHEST TUBE INSERTION     CHOLECYSTECTOMY  06/26/2016   Procedure: LAPAROSCOPIC CHOLECYSTECTOMY  subtotal;  Surgeon: Almond Lint, MD;  Location: [facility] OR;  Service: General;;   FRACTURE SURGERY Right 2001   right leg   HERNIA REPAIR     IR THORACENTESIS ASP PLEURAL SPACE W/IMG GUIDE  09/09/2022   MASS EXCISION Right 09/22/2015   Procedure: RIGHT THUMB EXCISION MASS;  Surgeon: Betha Loa, MD;  Location: [facility];  Service: Orthopedics;  Laterality: Right;   ROTATOR CUFF REPAIR  ROTATOR CUFF REPAIR Right 2002   TONSILLECTOMY     TOTAL KNEE ARTHROPLASTY Right 02/13/2017   Procedure: TOTAL KNEE ARTHROPLASTY;  Surgeon: Dannielle Huh, MD;  Location: [facility] OR;  Service: Orthopedics;  Laterality: Right;   Social History:  reports that he has never smoked. He has never used smokeless tobacco. He reports that he does not drink alcohol and does not use drugs.  Allergies  Allergen Reactions   Nsaids Other (See  Comments)    JAUNDICE   Tolmetin Other (See Comments)    jaundice   Celecoxib Nausea And Vomiting   Meperidine Hcl Nausea Only    Family History  Problem Relation Age of Onset   Hypertension Mother    Diabetes Mother    Hypertension Father    Heart attack Father    Diabetes Father    Heart attack Brother    Neuropathy Brother    Heart attack Paternal Uncle    Heart attack Paternal Uncle    Heart attack Paternal Uncle    Other Son        Leak in aorta    Prior to Admission medications   Medication Sig Start Date End Date Taking? Authorizing Provider  acetaminophen (TYLENOL) 650 MG CR tablet Take 1,300 mg every 8 (eight) hours as needed by mouth for pain.     [provider]  allopurinol (ZYLOPRIM) 100 MG tablet Take 200 mg by mouth daily. Patient not taking: Reported on 05/11/2023 10/18/16   [provider]  Alpha-Lipoic Acid 100 MG CAPS Take 2 capsules by mouth daily. Patient not taking: Reported on 05/11/2023    [provider]  Ascorbic Acid (VITAMIN C) 500 MG CAPS Take 500 mg by mouth daily.  Patient not taking: Reported on 05/11/2023    [provider]  Bilberry 100 MG CAPS Take 100 mg daily by mouth.  Patient not taking: Reported on 05/11/2023    [provider]  bisacodyl (DULCOLAX) 5 MG EC tablet Take 10 mg daily as needed by mouth for moderate constipation. Patient not taking: Reported on 05/11/2023    [provider]  CALCIUM PO Take 1 tablet by mouth daily. Patient not taking: Reported on 05/11/2023    [provider]  cholecalciferol (VITAMIN D) 1000 units tablet Take 1,000 Units daily by mouth.  Patient not taking: Reported on 05/11/2023    [provider]  Coenzyme Q10 (CO Q 10 PO) Take 120 mg daily by mouth.  Patient not taking: Reported on 05/11/2023    [provider]  colchicine 0.6 MG tablet Take 1 tablet (0.6 mg total) by mouth daily as needed (gout). Please take daily for next 5 days  until gout flare is resolved, then as needed Patient not taking: Reported on 05/11/2023 06/28/16   Rai, Delene Ruffini, MD  CRANBERRY PO Take 500 mg daily by mouth.  Patient not taking: Reported on 05/11/2023    [provider]  Cyanocobalamin (VITAMIN B-12 PO) Take 1 tablet daily by mouth. Patient not taking: Reported on 05/11/2023    [provider]  doxazosin (CARDURA) 4 MG tablet Take 4 mg by mouth at bedtime.  Patient not taking: Reported on 05/11/2023    [provider]  ELDERBERRY PO Take 1 capsule by mouth daily. Patient not taking: Reported on 05/11/2023    [provider]  febuxostat (ULORIC) 40 MG tablet Take 40 mg by mouth daily. 03/27/23   [provider]  Flaxseed, Linseed, (FLAX SEED OIL PO) Take  1 tablet by mouth daily. Patient not taking: Reported on 05/11/2023    [provider]  GINKGO BILOBA PO Take 120 mg daily by mouth.  Patient not taking: Reported on 05/11/2023    [provider]  Glucos-Chondroit-Hyaluron-MSM (GLUCOSAMINE CHONDROITIN JOINT PO) Take 2 tablets daily by mouth.     [provider]  guaiFENesin-dextromethorphan (ROBITUSSIN DM) 100-10 MG/5ML syrup Take 5 mLs by mouth every 4 (four) hours as needed for cough. 09/11/22   Jonah Blue, MD  levothyroxine (SYNTHROID) 150 MCG tablet Take 150 mcg by mouth daily before breakfast.    [provider]  lidocaine (LIDODERM) 5 % Place 1 patch onto the skin daily. Remove & Discard patch within 12 hours or as directed by MD 11/02/22   Tegeler, Canary Brim, MD  lisinopril (PRINIVIL,ZESTRIL) 20 MG tablet Take 1 tablet (20 mg total) by mouth daily. 06/28/16   Rai, Ripudeep Kirtland Bouchard, MD  Lutein 6 MG CAPS Take 6 mg daily by mouth. Patient not taking: Reported on 05/11/2023    [provider]  nitrofurantoin, macrocrystal-monohydrate, (MACROBID) 100 MG capsule Take 1 capsule (100 mg total) by mouth 2 (two) times daily. 05/11/23   Heilingoetter, Cassandra L,  PA-C  Omega-3 Fatty Acids (FISH OIL) 1000 MG CAPS Take 1,000 mg daily by mouth. Patient not taking: Reported on 05/11/2023    [provider]  OVER THE COUNTER MEDICATION Take 2 tablets 2 (two) times daily by mouth. Macular Protect Complete Supplement    [provider]  oxyCODONE (ROXICODONE) 5 MG immediate release tablet Take 1 tablet (5 mg total) by mouth every 8 (eight) hours as needed. Patient not taking: Reported on 05/11/2023 09/11/22 09/11/23  Jonah Blue, MD  polyethylene glycol [facility] Ethelene Hal) packet Take 17 g daily as needed by mouth for moderate constipation.    [provider]  predniSONE (DELTASONE) 10 MG tablet Take 2 tablets (20 mg total) by mouth daily with breakfast. Take 2 tablets daily x 2 weeks 05/05/23   Heilingoetter, Cassandra L, PA-C  predniSONE (DELTASONE) 10 MG tablet Take 2 tablets daily for 2 weeks 05/11/23   Heilingoetter, Cassandra L, PA-C  RESVERATROL PO Take 1 tablet by mouth daily.  Patient not taking: Reported on 05/11/2023    [provider]  senna (SENOKOT) 8.6 MG TABS tablet Take 1 tablet (8.6 mg total) by mouth at bedtime as needed for mild constipation. Patient taking differently: Take 1 tablet by mouth daily as needed for mild constipation. 12/20/13   Dhungel, Nishant, MD  TART CHERRY PO Take 1 tablet by mouth daily. Patient not taking: Reported on 05/11/2023    [provider]  Trolamine Salicylate (ASPERCREME EX) Apply 1 application as needed topically (for muscle pain). Patient not taking: Reported on 05/11/2023    [provider]  Turmeric 500 MG CAPS Take 500 mg 2 (two) times daily by mouth.  Patient not taking: Reported on 05/11/2023    [provider]    Physical Exam: Vitals:   05/22/23 1256 05/22/23 1838  BP: (!) 119/42 (!) 114/59  Pulse: 81 62  Resp: 16 16  Temp: 99.4 F (37.4 C) 98.5 F (36.9 C)  TempSrc: Oral Oral  SpO2: 96% 100%   Physical Exam Constitutional:       Appearance: Normal appearance. He is not ill-appearing.  HENT:     Head: Normocephalic and atraumatic.     Ears:     Comments: Hard of hearing    Mouth/Throat:  Mouth: Mucous membranes are dry.     Pharynx: Oropharynx is clear. No oropharyngeal exudate.  Eyes:     General: No scleral icterus.    Extraocular Movements: Extraocular movements intact.     Conjunctiva/sclera: Conjunctivae normal.     Pupils: Pupils are equal, round, and reactive to light.  Cardiovascular:     Rate and Rhythm: Normal rate and regular rhythm.     Heart sounds: Normal heart sounds. No murmur heard.    No friction rub. No gallop.  Pulmonary:     Effort: Pulmonary effort is normal.     Breath sounds: Normal breath sounds. No wheezing, rhonchi or rales.  Abdominal:     General: Bowel sounds are normal. There is no distension.     Palpations: Abdomen is soft.     Tenderness: There is no abdominal tenderness. There is no right CVA tenderness, left CVA tenderness, guarding or rebound.  Musculoskeletal:        General: Normal range of motion.     Cervical back: Normal range of motion.     Comments: Trace peripheral edema noted in bilateral lower extremities.  Tender to palpation of right lumbar paraspinal muscles.  No point tenderness over lumbar spine.  Skin:    General: Skin is warm and dry.  Neurological:     General: No focal deficit present.     Mental Status: He is alert. Mental status is at baseline.     Comments: Strength 4+/5 in all extremities.  No sensory deficits noted.  Psychiatric:        Mood and Affect: Mood normal.        Behavior: Behavior normal.     Data Reviewed:  There are no new results to review at this time.    Latest Ref Rng & Units 05/22/2023    1:31 PM 05/11/2023   10:16 AM 04/27/2023    8:54 AM  CBC  WBC 4.0 - 10.5 K/uL 9.4  2.6  1.8   Hemoglobin 13.0 - 17.0 g/dL 9.2  9.3  9.7   Hematocrit 39.0 - 52.0 % 27.9  28.2  28.6   Platelets 150 - 400 K/uL 31  43  51        Latest Ref Rng & Units 05/22/2023    1:31 PM 05/11/2023   10:16 AM 10/24/2022    1:01 PM  BMP  Glucose 70 - 99 mg/dL 161  93  86   BUN 8 - 23 mg/dL 26  21  13    Creatinine 0.61 - 1.24 mg/dL 0.96  0.45  4.09   Sodium 135 - 145 mmol/L 135  142  139   Potassium 3.5 - 5.1 mmol/L 3.4  3.6  4.0   Chloride 98 - 111 mmol/L 102  109  107   CO2 22 - 32 mmol/L 21  28  26    Calcium 8.9 - 10.3 mg/dL 8.5  8.9  8.8    Urinalysis    Component Value Date/Time   COLORURINE AMBER (A) 05/22/2023 2040   APPEARANCEUR HAZY (A) 05/22/2023 2040   LABSPEC 1.012 05/22/2023 2040   PHURINE 5.0 05/22/2023 2040   GLUCOSEU NEGATIVE 05/22/2023 2040   HGBUR MODERATE (A) 05/22/2023 2040   BILIRUBINUR NEGATIVE 05/22/2023 2040   KETONESUR NEGATIVE 05/22/2023 2040   PROTEINUR NEGATIVE 05/22/2023 2040   UROBILINOGEN 0.2 12/18/2013 0036   NITRITE NEGATIVE 05/22/2023 2040   LEUKOCYTESUR MODERATE (A) 05/22/2023 2040    CT Renal Stone Study Result Date: 05/22/2023 CLINICAL  DATA:  Abdominal/flank pain, stone suspected EXAM: CT ABDOMEN AND PELVIS WITHOUT CONTRAST TECHNIQUE: Multidetector CT imaging of the abdomen and pelvis was performed following the standard protocol without IV contrast. RADIATION DOSE REDUCTION: This exam was performed according to the departmental dose-optimization program which includes automated exposure control, adjustment of the mA and/or kV according to patient size and/or use of iterative reconstruction technique. COMPARISON:  11/20/2017 FINDINGS: Lower chest: Small left pleural effusion. Linear atelectasis in the left lower lobe. Hepatobiliary: Unremarkable unenhanced appearance of the liver. Clips in the gallbladder fossa postcholecystectomy. No biliary dilatation. Pancreas: No ductal dilatation or inflammation. Spleen: The spleen is enlarged, 15.4 cm cranial caudal. Adrenals/Urinary Tract: No adrenal nodule. There are bilateral nonobstructing intrarenal calculi. No hydronephrosis. Both ureters are  decompressed without ureteral stone. The urinary bladder is partially distended, no bladder stone or wall thickening. Stomach/Bowel: Small hiatal hernia. The stomach is nondistended. No small bowel obstruction or inflammatory change. Moderate diffuse colonic diverticulosis. Equivocal faint stranding about a diverticulum involving the distal descending colon series 6, image 81, may represent mild diverticulitis. Moderate volume of stool in the colon. Sigmoid colon is redundant. Vascular/Lymphatic: Aortic atherosclerosis. The aorta is tortuous but nonaneurysmal. No bulky abdominopelvic adenopathy. Reproductive: Enlarged prostate spans 5.9 cm transverse. Other: No ascites. No free air or focal fluid collection small bilateral inguinal hernias contain fat and a small amount of free fluid. Mild body wall edema. Musculoskeletal: The bones are subjectively under mineralized. Diffuse degenerative change throughout the lumbar spine with multilevel degenerative disc disease and facet hypertrophy. No fracture or acute osseous findings. IMPRESSION: 1. Bilateral nonobstructing intrarenal calculi. No ureteral stone or hydronephrosis. 2. Moderate diffuse colonic diverticulosis. Equivocal faint stranding about a diverticulum involving the distal descending colon, may represent mild diverticulitis. 3. Diffuse degenerative change throughout the spine without acute osseous findings. 4. Small left pleural effusion. 5. Chronic incidental findings include small hiatal hernia. Splenomegaly. Enlarged prostate. 6. Small bilateral inguinal hernias contain fat and a small amount of free fluid. Aortic Atherosclerosis (ICD10-I70.0). Electronically Signed   By: Narda Rutherford M.D.   On: 05/22/2023 18:29     Assessment and Plan: No notes have been filed under this hospital service. Service: Hospitalist   Acute UTI with hematuria Patient with recent history of UTI, found to have MSSA on urine cultures on 2/27.  He finished a 10-day  course of Macrobid in the outpatient setting which wife confirms adherence to.  He presents today with left low back pain and hematuria.  Urinalysis showing signs of UTI and patient does endorse dysuria.  He was also noted to have hematuria on prior urinalysis when diagnosed with UTI a couple of weeks ago.  Suspect that his hematuria is secondary to UTI.  Unclear at this time if patient developed a recurrent UTI or if this is the same UTI and he failed outpatient management.  Awaiting urine cultures.  CT renal stone study showing bilateral nonobstructive intrarenal calculi without hydronephrosis.  It is possible that some of his left low back pain is from renal colic secondary to his intrarenal calculi.  Will need to recheck a urinalysis a couple of weeks after his UTI is treated to ensure his hematuria has resolved. -IV rocephin 2g daily empirically -IV fluid bolus -f/u urine culture, blood cultures x2 -Tylenol every 6 hours as needed, oxycodone 5 mg every 4 hours as needed for pain control -Place in observation  AKI Patient with baseline creatinine around 0.8.  On admission, creatinine 1.30.  Per wife, patient  has had decreased p.o. intake recently.  Patient also presenting with UTI and does have nonobstructing renal calculi, no hydronephrosis noted.  Suspect this is prerenal in etiology.  Will provide trial of IV fluids and obtain a renal ultrasound to rule out obstructive uropathy. -IV fluid bolus -Follow-up renal ultrasound -Trend kidney function -Avoid nephrotoxic medications as able, holding home lisinopril -Monitor urine output  Hypokalemia -K 3.4 -Giving 1 dose of oral potassium 40 mEq -Trend potassium, replete as necessary  Hx of pancytopenia Extensive workup done in the outpatient setting has been unrevealing, including negative bone marrow biopsy and aspirate (no findings concerning for MDS or MPN). Findings were suspicious for drug-induced versus autoimmune induced abnormality. CBC  today showing WBC 9.4 (possible relative leukocytosis from UTI), hgb at baseline, and slightly worsened thrombocytopenia.  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  Will continue prednisone here. Has follow up with oncology. Dr. Shirline Frees, on Thursday (3/13). -trend CBC (monitor platelet counts closely) -Continue prednisone 20 mg daily -SCDs for DVT ppx -f/u folate, B12 levels -f/u with oncology as outpatient  Lumbar spinal stenosis with neurogenic claudication -Noted on MRI lumbar spine in 01/2022 -Could be the etiology of his low back pain -heating pad, lidocaine patch, other pain control as noted above -PT/OT eval  Hypertension -Blood pressures ranging in the 110s/40s-50s since arrival to the ED -Holding lisinopril given AKI -Trend blood pressure curve  Hypothyroidism -Continue home Synthroid  Gout Per wife, patient no longer on any gout medications as of a month ago.  Per wife, it was suspected that his gout medications could be contributing to his pancytopenia.  Patient is stable from the standpoint, no acute gout flare noted.    Advance Care Planning:   Code Status: Full Code   Consults: none  Family Communication: updated family at bedside  Severity of Illness: The appropriate patient status for this patient is OBSERVATION. Observation status is judged to be reasonable and necessary in order to provide the required intensity of service to ensure the patient's safety. The patient's presenting symptoms, physical exam findings, and initial radiographic and laboratory data in the context of their medical condition is felt to place them at decreased risk for further clinical deterioration. Furthermore, it is anticipated that the patient will be medically stable for discharge from the hospital within 2 midnights of admission.   Portions of this note were  generated with Scientist, clinical (histocompatibility and immunogenetics). Dictation errors may occur despite best attempts at proofreading.   Author: Briscoe Burns, MD 05/22/2023 10:18 PM  For on call review [email or web address].

## 2023-05-22 NOTE — ED Triage Notes (Signed)
 Pt arrived via EMS with bilateral lower back pain. Pt reports hematuria times 2 days., Pt was treated for uti 2 weeks ago and did finish all of his antibiotics. Pt received oxycontin or codone this am at 0930 from his wife. Pt states it has helped a little but still hurts at a 10/10. Pt is alert and oriented. Pt wears hearing aids.

## 2023-05-22 NOTE — Hospital Course (Addendum)
 89yo with h/o HTN, AVNRT s/p ablation, hypothyroidism, pancytopenia, and lumbar stenosis with neurogenic claudication who presented on 3/10 with LBP and hematuria.  He was recently treated for MSSA UTI with 10 days of Macrobid.  CT with B nonobstructing intrarenal calculi without hydronephrosis.  He was found to have MSSA UTI with bacteremia and was started on Ceftriaxone.  CT maxillofacial was unremarkable.

## 2023-05-22 NOTE — ED Provider Notes (Signed)
 [place] EMERGENCY DEPARTMENT AT [facility] Provider Note   CSN: [id number] Arrival date & time: 05/22/23  1230     History  Chief Complaint  Patient presents with   Back Pain   Hematuria    Samuel Little is a 89 y.o. male with PMH as listed below who presents with his wife who provides additional history. Notes lower back pain and hematuria that began yesterday. Also had fever to 102F at home last night. Was treated at the end of February for UTI (staph aureus by culture) w/ macrobid. Patient is very hard of hearing. He reports low back pain.  Predominantly central at about the low lumbar high sacral area.  He denies he is having dysuria but had some previously. Patient reports that he started getting back pain that was severe enough over the past day and then today that is difficult for him to walk and get around. Pain doesn't radiate down his legs. Does have h/o spinal stenosis diagnosed by MRI. No numbness/tingling in legs, urinary retention or incontinence, no saddle anesthesia.  Has had no falls/trauma.   Also has recently been worked up for thrombocytopenia. Per chart review visit with oncology Dr. 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 The findings were suspicious for drug-induced versus autoimmune induced abnormality. he patient had extensive studies that were unremarkable and his bone marrow biopsy and aspirate performed recently showed no underlying MDS or MPN. The findings were suspicious to be secondary to drug/toxin or autoimmune disorder. " Has been treated w/ prednisone and was planned for repeat blood work. It was at that visit on 05/11/23 during which he was diagnosed w/ UTI.  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 Authorizing Provider  acetaminophen (TYLENOL) 650 MG CR tablet Take 1,300 mg every 8 (eight) hours as needed by mouth for pain.     [provider]  febuxostat (ULORIC) 40 MG tablet Take 40 mg by mouth daily. 03/27/23   [provider]  Glucos-Chondroit-Hyaluron-MSM (GLUCOSAMINE CHONDROITIN JOINT PO) Take 2 tablets daily by mouth.     [provider]  guaiFENesin-dextromethorphan (ROBITUSSIN DM) 100-10 MG/5ML syrup Take 5 mLs by mouth every 4 (four) hours as needed for cough. 09/11/22   Jonah Blue, MD  levothyroxine (SYNTHROID) 150 MCG tablet Take 150 mcg by mouth daily before breakfast.    [provider]  lidocaine (LIDODERM) 5 % Place 1 patch onto the skin daily. Remove & Discard patch within 12 hours or as directed by MD 11/02/22   Tegeler, Canary Brim, MD  lisinopril (PRINIVIL,ZESTRIL) 20 MG tablet Take 1 tablet (20 mg total) by mouth daily. 06/28/16   Rai, Delene Ruffini, MD  OVER THE COUNTER MEDICATION Take 2 tablets 2 (two) times  daily by mouth. Macular Protect Complete Supplement    [provider]  polyethylene glycol (MIRALAX / GLYCOLAX) packet Take 17 g daily as needed by mouth for moderate constipation.    [provider]  predniSONE (DELTASONE) 10 MG tablet Take 2 tablets (20 mg total) by mouth daily with breakfast. Take 2 tablets daily x 2 weeks 05/05/23   Heilingoetter, Cassandra L, PA-C  predniSONE (DELTASONE) 10 MG tablet Take 2 tablets daily for 2 weeks 05/11/23   Heilingoetter, Cassandra L, PA-C  senna (SENOKOT) 8.6 MG TABS tablet Take 1  tablet (8.6 mg total) by mouth at bedtime as needed for mild constipation. Patient taking differently: Take 1 tablet by mouth daily as needed for mild constipation. 12/20/13   Dhungel, Theda Belfast, MD      Allergies    Nsaids, Tolmetin, Celecoxib, and Meperidine hcl    Review of Systems   Review of Systems A 10 point review of systems was performed and is negative unless otherwise reported in HPI.  Physical Exam Updated Vital Signs BP (!) 114/59 (BP Location: Left Arm)   Pulse 62   Temp 98 F (36.7 C) (Oral)   Resp 16   SpO2 100%  Physical Exam General: Normal appearing male, lying in bed. HoH.   HEENT: PERRLA, Sclera anicteric, MMM, trachea midline.  Cardiology: RRR, no murmurs/rubs/gallops.  Resp: Normal respiratory rate and effort. CTAB, no wheezes, rhonchi, crackles.  Abd: Soft, non-tender, non-distended. No rebound tenderness or guarding.  GU: Deferred. MSK: No peripheral edema or signs of trauma. Extremities without deformity or TTP.  Skin: warm, dry. Back: No CVA tenderness. Diffuse lower lumbar TTP throughout both sides of lower lumbar spine. No deformities/stepoffs.  Neuro: A&Ox4, CNs II-XII grossly intact. 4/5 strength in all extremities. Sensation grossly intact.   ED Results / Procedures / Treatments   Labs (all labs ordered are listed, but only abnormal results are displayed) Labs Reviewed  BASIC METABOLIC PANEL - Abnormal; Notable for the following components:      Result Value   Potassium 3.4 (*)    CO2 21 (*)    Glucose, Bld 103 (*)    BUN 26 (*)    Creatinine, Ser 1.30 (*)    Calcium 8.5 (*)    GFR, Estimated 53 (*)    All other components within normal limits  CBC - Abnormal; Notable for the following components:   RBC 2.71 (*)    Hemoglobin 9.2 (*)    HCT 27.9 (*)    MCV 103.0 (*)    RDW 21.8 (*)    Platelets 31 (*)    nRBC 1.1 (*)    All other components within normal limits  URINALYSIS, W/ REFLEX TO CULTURE (INFECTION SUSPECTED) - Abnormal;  Notable for the following components:   Color, Urine AMBER (*)    APPearance HAZY (*)    Hgb urine dipstick MODERATE (*)    Leukocytes,Ua MODERATE (*)    Bacteria, UA FEW (*)    All other components within normal limits  RESP PANEL BY RT-PCR (RSV, FLU A&B, COVID)  RVPGX2  URINE CULTURE  CULTURE, BLOOD (ROUTINE X 2)  CULTURE, BLOOD (ROUTINE X 2)  BASIC METABOLIC PANEL  CBC  FOLATE  VITAMIN B12    EKG None  Radiology CT Renal Stone Study Result Date: 05/22/2023 CLINICAL DATA:  Abdominal/flank pain, stone suspected EXAM: CT ABDOMEN AND PELVIS WITHOUT CONTRAST TECHNIQUE: Multidetector CT imaging of the abdomen and pelvis was performed following the standard protocol without IV contrast.  RADIATION DOSE REDUCTION: This exam was performed according to the departmental dose-optimization program which includes automated exposure control, adjustment of the mA and/or kV according to patient size and/or use of iterative reconstruction technique. COMPARISON:  11/20/2017 FINDINGS: Lower chest: Small left pleural effusion. Linear atelectasis in the left lower lobe. Hepatobiliary: Unremarkable unenhanced appearance of the liver. Clips in the gallbladder fossa postcholecystectomy. No biliary dilatation. Pancreas: No ductal dilatation or inflammation. Spleen: The spleen is enlarged, 15.4 cm cranial caudal. Adrenals/Urinary Tract: No adrenal nodule. There are bilateral nonobstructing intrarenal calculi. No hydronephrosis. Both ureters are decompressed without ureteral stone. The urinary bladder is partially distended, no bladder stone or wall thickening. Stomach/Bowel: Small hiatal hernia. The stomach is nondistended. No small bowel obstruction or inflammatory change. Moderate diffuse colonic diverticulosis. Equivocal faint stranding about a diverticulum involving the distal descending colon series 6, image 81, may represent mild diverticulitis. Moderate volume of stool in the colon. Sigmoid colon is redundant.  Vascular/Lymphatic: Aortic atherosclerosis. The aorta is tortuous but nonaneurysmal. No bulky abdominopelvic adenopathy. Reproductive: Enlarged prostate spans 5.9 cm transverse. Other: No ascites. No free air or focal fluid collection small bilateral inguinal hernias contain fat and a small amount of free fluid. Mild body wall edema. Musculoskeletal: The bones are subjectively under mineralized. Diffuse degenerative change throughout the lumbar spine with multilevel degenerative disc disease and facet hypertrophy. No fracture or acute osseous findings. IMPRESSION: 1. Bilateral nonobstructing intrarenal calculi. No ureteral stone or hydronephrosis. 2. Moderate diffuse colonic diverticulosis. Equivocal faint stranding about a diverticulum involving the distal descending colon, may represent mild diverticulitis. 3. Diffuse degenerative change throughout the spine without acute osseous findings. 4. Small left pleural effusion. 5. Chronic incidental findings include small hiatal hernia. Splenomegaly. Enlarged prostate. 6. Small bilateral inguinal hernias contain fat and a small amount of free fluid. Aortic Atherosclerosis (ICD10-I70.0). Electronically Signed   By: Narda Rutherford M.D.   On: 05/22/2023 18:29    Procedures Procedures    Medications Ordered in ED Medications  lidocaine (LIDODERM) 5 % 1 patch (1 patch Transdermal Patch Applied 05/22/23 2121)  acetaminophen (TYLENOL) tablet 650 mg (has no administration in time range)    Or  acetaminophen (TYLENOL) suppository 650 mg (has no administration in time range)  lactated ringers bolus 1,000 mL (has no administration in time range)  cefTRIAXone (ROCEPHIN) 2 g in sodium chloride 0.9 % 100 mL IVPB (has no administration in time range)  oxyCODONE (Oxy IR/ROXICODONE) immediate release tablet 5 mg (has no administration in time range)  predniSONE (DELTASONE) tablet 20 mg (has no administration in time range)  levothyroxine (SYNTHROID) tablet 150 mcg (has  no administration in time range)  oxyCODONE-acetaminophen (PERCOCET/ROXICET) 5-325 MG per tablet 1 tablet (1 tablet Oral Given 05/22/23 2121)    ED Course/ Medical Decision Making/ A&P                          Medical Decision Making Amount and/or Complexity of Data Reviewed Labs: ordered. Decision-making details documented in ED Course. Radiology:  Decision-making details documented in ED Course.  Risk Prescription drug management. Decision regarding hospitalization.    This patient presents to the ED for concern of hematuria, fever, back pain, this involves an extensive number of treatment options, and is a complaint that carries with it a high risk of complications and morbidity.  I considered the following differential and admission for this acute, potentially life threatening condition. Patient is afebrile, HDS at this time.   MDM:  Consider complicated UTI such as pyelonephritis or prostatitis. He was recently treated for staph aureus UTI with macrobid, which was sensitive. Consider possible recurrent infection or failure of o/p treatment. He doesn't have CVA tenderness but difficult to exactly assess where back pain is. CT renal stone study reassuringly doesn't show any hydronephrosis or ureterolithiasis. He doesn't have any abd pain or TTP to indicate diverticulitis as is possibly shown on CT. CT doesn't demonstrate acute vertebral osseous findings either. He does have enlarged prostate noted. Could have hematuria d/t his thrombocytopenia as well, which is noted to be 31 today and downtrending. Currently undergoing workup for this outpatient with hem/onc and on prednisone. No other active bleeding to warrant platelet transfusion. No hypertensive emergency. Does have AKI, could consider interstitial nephritis or papillary necrosis as possible cause.   Higher suspicion for complicated UTI causing symptoms but if pain doesn't improve w/ treatment of UTI, patient may need MRI imaging to  evaluate for spinal cause of infection such as transverse myelitis or spinal epidural abscess. He doesn't have focal neuro deficits, numbness/tingling, saddle anesthesia, or urinary retention/incontinence to suggest cauda equina syndrome.    Clinical Course as of 05/22/23 2310  Mon May 22, 2023  1808 Hemoglobin(!): 9.2 Stable from last week but slowly downtrending [HN]  1808 WBC: 9.4 No leukocytosis  [HN]  1808 Creatinine(!): 1.30 +AKI [HN]  1832 CT Renal Stone Study 1. Bilateral nonobstructing intrarenal calculi. No ureteral stone or hydronephrosis. 2. Moderate diffuse colonic diverticulosis. Equivocal faint stranding about a diverticulum involving the distal descending colon, may represent mild diverticulitis. 3. Diffuse degenerative change throughout the spine without acute osseous findings. 4. Small left pleural effusion. 5. Chronic incidental findings include small hiatal hernia. Splenomegaly. Enlarged prostate. 6. Small bilateral inguinal hernias contain fat and a small amount of free fluid.   [HN]  2040 Platelets(!): 31 Downtrending platelets [HN]  2107 Urinalysis, w/ Reflex to Culture (Infection Suspected) -Urine, Clean Catch(!) +UTI [HN]  2144 D/w pharmacy who recommended Bcx and IV vancomycin as well as IV ceftriaxone.  [HN]  2144 Will consult to hospitalist [HN]    Clinical Course User Index [HN] Loetta Rough, MD    Labs: I Ordered, and personally interpreted labs.  The pertinent results include:  those listed above  Imaging Studies ordered: CT renal stone study ordered from triage I independently visualized and interpreted imaging. I agree with the radiologist interpretation  Additional history obtained from chart review, wife at bedside.   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 , march 2,2011    Arthritis    Diverticulosis    Effusion, pericardium    Gastroesophageal reflux disease    Gout    Hard of hearing    wears bilateral hearing aids   History of kidney stones    Hypertension    Hypothyroidism    Macular degeneration    Pleural effusion, left    Right bundle branch block    Thyroid disease    hypothyroidism   Wears glasses      Medicines Meds ordered this encounter  Medications   oxyCODONE-acetaminophen (PERCOCET/ROXICET) 5-325 MG per tablet 1 tablet    Refill:  0  lidocaine (LIDODERM) 5 % 1 patch   DISCONTD: vancomycin (VANCOREADY) IVPB 1500 mg/300 mL    Indication::   Other Indication (list below)    Other Indication::   ?  MRSA pyleo   DISCONTD: cefTRIAXone (ROCEPHIN) 1 g in sodium chloride 0.9 % 100 mL IVPB    Antibiotic Indication::   UTI   OR Linked Order Group    acetaminophen (TYLENOL) tablet 650 mg    acetaminophen (TYLENOL) suppository 650 mg   lactated ringers bolus 1,000 mL   cefTRIAXone (ROCEPHIN) 2 g in sodium chloride 0.9 % 100 mL IVPB    Antibiotic Indication::   UTI   oxyCODONE (Oxy IR/ROXICODONE) immediate release tablet 5 mg    Refill:  0   predniSONE (DELTASONE) tablet 20 mg    Take 2 tablets daily x 2 weeks     levothyroxine (SYNTHROID) tablet 150 mcg    I have reviewed the patients home medicines and have made adjustments as needed  Problem List / ED Course: Problem List Items Addressed This Visit   None Visit Diagnoses       Acute cystitis with hematuria    -  Primary     Thrombocytopenia (HCC)         Acute bilateral low back pain without sciatica       Relevant Medications   oxyCODONE-acetaminophen (PERCOCET/ROXICET) 5-325 MG per tablet 1 tablet (Completed)   acetaminophen (TYLENOL) tablet 650 mg    acetaminophen (TYLENOL) suppository 650 mg   oxyCODONE (Oxy IR/ROXICODONE) immediate release tablet 5 mg   predniSONE (DELTASONE) tablet 20 mg (Start on 05/23/2023  8:00 AM)                   This note was created using dictation software, which may contain spelling or grammatical errors.    Loetta Rough, MD 05/22/23 [phone]

## 2023-05-22 NOTE — ED Notes (Signed)
 Patient transported to Ultrasound

## 2023-05-23 DIAGNOSIS — N179 Acute kidney failure, unspecified: Secondary | ICD-10-CM | POA: Diagnosis present

## 2023-05-23 DIAGNOSIS — D696 Thrombocytopenia, unspecified: Secondary | ICD-10-CM | POA: Diagnosis present

## 2023-05-23 DIAGNOSIS — J90 Pleural effusion, not elsewhere classified: Secondary | ICD-10-CM | POA: Diagnosis present

## 2023-05-23 DIAGNOSIS — Z66 Do not resuscitate: Secondary | ICD-10-CM | POA: Diagnosis present

## 2023-05-23 DIAGNOSIS — B9561 Methicillin susceptible Staphylococcus aureus infection as the cause of diseases classified elsewhere: Secondary | ICD-10-CM | POA: Diagnosis present

## 2023-05-23 DIAGNOSIS — I10 Essential (primary) hypertension: Secondary | ICD-10-CM | POA: Diagnosis present

## 2023-05-23 DIAGNOSIS — M4807 Spinal stenosis, lumbosacral region: Secondary | ICD-10-CM | POA: Diagnosis present

## 2023-05-23 DIAGNOSIS — N3001 Acute cystitis with hematuria: Secondary | ICD-10-CM | POA: Diagnosis present

## 2023-05-23 DIAGNOSIS — M109 Gout, unspecified: Secondary | ICD-10-CM | POA: Diagnosis present

## 2023-05-23 DIAGNOSIS — H9190 Unspecified hearing loss, unspecified ear: Secondary | ICD-10-CM | POA: Diagnosis present

## 2023-05-23 DIAGNOSIS — D539 Nutritional anemia, unspecified: Secondary | ICD-10-CM | POA: Diagnosis present

## 2023-05-23 DIAGNOSIS — E039 Hypothyroidism, unspecified: Secondary | ICD-10-CM | POA: Diagnosis present

## 2023-05-23 DIAGNOSIS — R7881 Bacteremia: Secondary | ICD-10-CM | POA: Diagnosis present

## 2023-05-23 DIAGNOSIS — D61818 Other pancytopenia: Secondary | ICD-10-CM | POA: Diagnosis present

## 2023-05-23 DIAGNOSIS — M545 Low back pain, unspecified: Secondary | ICD-10-CM | POA: Diagnosis not present

## 2023-05-23 DIAGNOSIS — N390 Urinary tract infection, site not specified: Secondary | ICD-10-CM | POA: Diagnosis not present

## 2023-05-23 DIAGNOSIS — Z96651 Presence of right artificial knee joint: Secondary | ICD-10-CM | POA: Diagnosis present

## 2023-05-23 DIAGNOSIS — Z888 Allergy status to other drugs, medicaments and biological substances status: Secondary | ICD-10-CM | POA: Diagnosis not present

## 2023-05-23 DIAGNOSIS — H3530 Unspecified macular degeneration: Secondary | ICD-10-CM | POA: Diagnosis present

## 2023-05-23 DIAGNOSIS — E876 Hypokalemia: Secondary | ICD-10-CM | POA: Diagnosis present

## 2023-05-23 DIAGNOSIS — N400 Enlarged prostate without lower urinary tract symptoms: Secondary | ICD-10-CM | POA: Diagnosis present

## 2023-05-23 DIAGNOSIS — Z79890 Hormone replacement therapy (postmenopausal): Secondary | ICD-10-CM | POA: Diagnosis not present

## 2023-05-23 DIAGNOSIS — Z833 Family history of diabetes mellitus: Secondary | ICD-10-CM | POA: Diagnosis not present

## 2023-05-23 DIAGNOSIS — I38 Endocarditis, valve unspecified: Secondary | ICD-10-CM | POA: Diagnosis not present

## 2023-05-23 DIAGNOSIS — Z515 Encounter for palliative care: Secondary | ICD-10-CM | POA: Diagnosis not present

## 2023-05-23 DIAGNOSIS — Z7189 Other specified counseling: Secondary | ICD-10-CM | POA: Diagnosis not present

## 2023-05-23 DIAGNOSIS — M48062 Spinal stenosis, lumbar region with neurogenic claudication: Secondary | ICD-10-CM | POA: Diagnosis present

## 2023-05-23 DIAGNOSIS — Z8249 Family history of ischemic heart disease and other diseases of the circulatory system: Secondary | ICD-10-CM | POA: Diagnosis not present

## 2023-05-23 DIAGNOSIS — R739 Hyperglycemia, unspecified: Secondary | ICD-10-CM | POA: Diagnosis present

## 2023-05-23 LAB — BLOOD CULTURE ID PANEL (REFLEXED) - BCID2

## 2023-05-23 LAB — RESP PANEL BY RT-PCR (RSV, FLU A&B, COVID)  RVPGX2
Influenza A by PCR: NEGATIVE
Influenza B by PCR: NEGATIVE
Resp Syncytial Virus by PCR: NEGATIVE
SARS Coronavirus 2 by RT PCR: NEGATIVE

## 2023-05-23 LAB — CBC
HCT: 26.2 % — ABNORMAL LOW (ref 39.0–52.0)
Hemoglobin: 8.9 g/dL — ABNORMAL LOW (ref 13.0–17.0)
MCH: 34.4 pg — ABNORMAL HIGH (ref 26.0–34.0)
MCHC: 34 g/dL (ref 30.0–36.0)
MCV: 101.2 fL — ABNORMAL HIGH (ref 80.0–100.0)
Platelets: 25 10*3/uL — CL (ref 150–400)
RBC: 2.59 MIL/uL — ABNORMAL LOW (ref 4.22–5.81)
RDW: 21.7 % — ABNORMAL HIGH (ref 11.5–15.5)
WBC: 7.3 10*3/uL (ref 4.0–10.5)
nRBC: 0.5 % — ABNORMAL HIGH (ref 0.0–0.2)

## 2023-05-23 LAB — VITAMIN B12: Vitamin B-12: 1357 pg/mL — ABNORMAL HIGH (ref 180–914)

## 2023-05-23 LAB — BASIC METABOLIC PANEL
Anion gap: 11 (ref 5–15)
BUN: 38 mg/dL — ABNORMAL HIGH (ref 8–23)
CO2: 20 mmol/L — ABNORMAL LOW (ref 22–32)
Calcium: 8.3 mg/dL — ABNORMAL LOW (ref 8.9–10.3)
Chloride: 106 mmol/L (ref 98–111)
Creatinine, Ser: 1.44 mg/dL — ABNORMAL HIGH (ref 0.61–1.24)
GFR, Estimated: 47 mL/min — ABNORMAL LOW (ref >60)
Glucose, Bld: 94 mg/dL (ref 70–99)
Potassium: 3.9 mmol/L (ref 3.5–5.1)
Sodium: 137 mmol/L (ref 135–145)

## 2023-05-23 LAB — FOLATE: Folate: 18.4 ng/mL (ref >5.9)

## 2023-05-23 MED ORDER — NAPHAZOLINE-GLYCERIN 0.012-0.25 % OP SOLN
1.0000 [drp] | Freq: Four times a day (QID) | OPHTHALMIC | Status: DC | PRN
  Administered 2023-05-25: 2 [drp] via OPHTHALMIC
  Filled 2023-05-23: qty 15

## 2023-05-23 MED ORDER — CEFAZOLIN SODIUM-DEXTROSE 2-4 GM/100ML-% IV SOLN
2.0000 g | Freq: Three times a day (TID) | INTRAVENOUS | Status: DC
  Administered 2023-05-23 – 2023-05-29 (×18): 2 g via INTRAVENOUS
  Filled 2023-05-23 (×18): qty 100

## 2023-05-23 MED ORDER — POLYETHYLENE GLYCOL 3350 17 G PO PACK: 17.0000 g | PACK | Freq: Every day | ORAL | Status: DC | PRN

## 2023-05-23 MED ORDER — SODIUM CHLORIDE 0.9 % IV SOLN: INTRAVENOUS | Status: DC

## 2023-05-23 NOTE — Progress Notes (Signed)
 PHARMACY - PHYSICIAN COMMUNICATION CRITICAL VALUE ALERT - BLOOD CULTURE IDENTIFICATION (BCID)  Samuel Little is an 89 y.o. male who presented to [facility] on 05/22/2023 with a chief complaint of lower back pain and hematuria.   Assessment:  89 year old male admitted with hematuria. Notably had MSSA in the urine on 2/27 that was treated with nitrofurantoin. Now blood cultures showing 1/2 MSSA.   Name of physician (or Provider) Contacted: Erskine Emery (ID)   Current antibiotics: Ceftriaxone   Changes to prescribed antibiotics recommended:  Adjust ceftriaxone to cefazolin 2 gm IV q 8 hours   Results for orders placed or performed during the hospital encounter of 05/22/23  Blood Culture ID Panel (Reflexed) (Collected: 05/22/2023  9:46 PM)  Result Value Ref Range   Enterococcus faecalis NOT DETECTED NOT DETECTED   Enterococcus Faecium NOT DETECTED NOT DETECTED   Listeria monocytogenes NOT DETECTED NOT DETECTED   Staphylococcus species DETECTED (A) NOT DETECTED   Staphylococcus aureus (BCID) DETECTED (A) NOT DETECTED   Staphylococcus epidermidis NOT DETECTED NOT DETECTED   Staphylococcus lugdunensis NOT DETECTED NOT DETECTED   Streptococcus species NOT DETECTED NOT DETECTED   Streptococcus agalactiae NOT DETECTED NOT DETECTED   Streptococcus pneumoniae NOT DETECTED NOT DETECTED   Streptococcus pyogenes NOT DETECTED NOT DETECTED   A.calcoaceticus-baumannii NOT DETECTED NOT DETECTED   Bacteroides fragilis NOT DETECTED NOT DETECTED   Enterobacterales NOT DETECTED NOT DETECTED   Enterobacter cloacae complex NOT DETECTED NOT DETECTED   Escherichia coli NOT DETECTED NOT DETECTED   Klebsiella aerogenes NOT DETECTED NOT DETECTED   Klebsiella oxytoca NOT DETECTED NOT DETECTED   Klebsiella pneumoniae NOT DETECTED NOT DETECTED   Proteus species NOT DETECTED NOT DETECTED   Salmonella species NOT DETECTED NOT DETECTED   Serratia marcescens NOT DETECTED NOT DETECTED   Haemophilus influenzae NOT  DETECTED NOT DETECTED   Neisseria meningitidis NOT DETECTED NOT DETECTED   Pseudomonas aeruginosa NOT DETECTED NOT DETECTED   Stenotrophomonas maltophilia NOT DETECTED NOT DETECTED   Candida albicans NOT DETECTED NOT DETECTED   Candida auris NOT DETECTED NOT DETECTED   Candida glabrata NOT DETECTED NOT DETECTED   Candida krusei NOT DETECTED NOT DETECTED   Candida parapsilosis NOT DETECTED NOT DETECTED   Candida tropicalis NOT DETECTED NOT DETECTED   Cryptococcus neoformans/gattii NOT DETECTED NOT DETECTED   Meth resistant mecA/C and MREJ NOT DETECTED NOT DETECTED    Sharin Mons, PharmD, BCPS, BCIDP Infectious Diseases Clinical Pharmacist Phone: [phone] 05/23/2023  12:54 PM

## 2023-05-23 NOTE — Progress Notes (Signed)
 PROGRESS NOTE  Samuel Little  DOB: [birth year]  PCP: Samuel Housekeeper, MD QIO:[id number]  DOA: 05/22/2023  LOS: 0 days  Hospital Day: 2  Brief narrative: Samuel Little is a 89 y.o. male with PMH significant for HTN, AVNRT s/p ablation, hypothyroidism, pancytopenia, gout,lumbar spinal stenosis with neurogenic claudication. 3/10, patient was brought to the ED by EMS for bilateral lower back pain and hematuria for 2 days.  Patient was treated for MSSA UTI 2 weeks ago and completed 10-day course of Macrobid. Wife stated that patient was in his usual state of health until the previous night when he began having left lower back/flank pain.  Patient states that the pain remained in his left lower back without radiation down his lower extremities.  On the morning of presentation patient had 2 episodes of gross hematuria.   In the ED, patient was afebrile, hemodynamically stable CBC with WBC 9.4 (baseline around 2.5), hemoglobin 9.2 (around baseline), platelet 31 (downtrending over past 6 months).   BMP with mild hypokalemia, mild hyperglycemia, creatinine 1.3 (baseline around 0.8).   Urinalysis with moderate hematuria, moderate leukocytes, greater than 50 WBCs, few bacteria.   Urine culture and blood culture sent. CT renal stone study showed b/l nonobstructing intrarenal calculi without any hydronephrosis along with diffuse degenerative changes throughout the spine without any acute osseous findings.   Patient was started on IV ceftriaxone empirically for UTI.  Admitted to [facility]  Subjective: Patient was seen and examined this morning. Pleasant elderly Caucasian male.  Propped up in bed.  Very hard of hearing.  Unable to have a meaningful conversation with him.  Patient not in distress.  Spoke with his son on the phone this afternoon.  Chart reviewed Remains afebrile hemodynamically stable BMP this morning with creatinine 1.44, CBC pending  Assessment and plan: Acute UTI with hematuria Recent  MSSA UTI MSSA bacteremia Patient recently completed 10-day course of Macrobid  Presented with hematuria and bilateral flank pain  Urinalysis with bacteria, leukocytes and blood  CT renal study with nonobstructing intrarenal calculi, no hydronephrosis, no suspicious bladder lesions. Blood culture is growing MSSA in 1/2 bottles.   Currently on IV Rocephin. Discussed with pharmacy.  Switched to IV Ancef.  ID to see Tylenol every 6 hours as needed, oxycodone 5 mg every 4 hours as needed for pain control   AKI Baseline creatinine 0.8.  Presented with creatinine 1.3-reports decreased oral intake  Renal ultrasound showed bilateral nonobstructing renal calculi, echogenic material within the urinary bladder which may have been sequelae associated with radiation to infection. IV fluid to continue. Continue to monitor  Recent Labs    09/09/22 0842 09/10/22 0326 09/11/22 0414 10/24/22 1301 05/11/23 1016 05/22/23 1331 05/23/23 0521  BUN 11 15 18 13 21  26* 38*  CREATININE 0.74 0.75 0.68 0.76 0.89 1.30* 1.44*  CO2 20* 23 23 26 28  21* 20*   Hypokalemia Potassium level improved with replacement. Recent Labs  Lab 05/22/23 1331 05/23/23 0521  K 3.4* 3.9   Hx of pancytopenia Extensive workup done in the outpatient setting has been unrevealing, including negative bone marrow biopsy and aspirate (no findings concerning for MDS or MPN). Findings were suspicious for drug-induced versus autoimmune induced abnormality. CBC today showing WBC 9.4 (possible relative leukocytosis from UTI), hgb at baseline, and slightly worsened thrombocytopenia.  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  Will continue prednisone  here. Has follow up with oncology. Dr. Shirline Frees, on Thursday (3/13). -Per wife, gout medicine was also suspected to be leading to pancytopenia and hence it was stopped  about a month ago. -trend CBC (monitor platelet counts closely) -B12 and folate level adequate -Continue prednisone 20 mg daily -SCDs for DVT ppx -f/u with oncology as outpatient   Lumbar spinal stenosis with neurogenic claudication Continue could be the etiology of his low back pain heating pad, lidocaine patch, other pain control as noted above PT/OT eval   Hypertension Lisinopril on hold.  Blood pressure meds. Continue to monitor.  Elevated and as needed  Hypothyroidism Continue home Synthroid   Mobility: Pending PT eval  Goals of care   Code Status: Full Code     DVT prophylaxis:  SCDs Start: 05/22/23 2210   Antimicrobials: IV Ancef Fluid: Normal saline at 75 mL/h Consultants: None Family Communication: I spoke with his son on the phone this afternoon.  Status: Inpatient Level of care:  Med-Surg   Patient is from: Home Needs to continue in-hospital care: Pending clinical course Anticipated d/c to: Pending PT eval    Diet:  Diet Order             Diet Heart Room service appropriate? Yes; Fluid consistency: Thin  Diet effective now                   Scheduled Meds:  levothyroxine  150 mcg Oral QAC breakfast   lidocaine  1 patch Transdermal Q24H   predniSONE  20 mg Oral Q breakfast    PRN meds: acetaminophen **OR** acetaminophen, naphazoline-glycerin, oxyCODONE   Infusions:   sodium chloride      ceFAZolin (ANCEF) IV      Antimicrobials: Anti-infectives (From admission, onward)    Start     Dose/Rate Route Frequency Ordered Stop   05/23/23 1400  ceFAZolin (ANCEF) IVPB 2g/100 mL premix        2 g 200 mL/hr over 30 Minutes Intravenous Every 8 hours 05/23/23 1253     05/22/23 2230  cefTRIAXone (ROCEPHIN) 2 g in sodium chloride 0.9 % 100 mL IVPB  Status:  Discontinued        2 g 200 mL/hr over 30 Minutes Intravenous Every 24 hours 05/22/23 2215 05/23/23 1253   05/22/23 2200  vancomycin (VANCOREADY) IVPB 1500 mg/300 mL  Status:   Discontinued        1,500 mg 150 mL/hr over 120 Minutes Intravenous  Once 05/22/23 2151 05/22/23 2215   05/22/23 2200  cefTRIAXone (ROCEPHIN) 1 g in sodium chloride 0.9 % 100 mL IVPB  Status:  Discontinued        1 g 200 mL/hr over 30 Minutes Intravenous  Once 05/22/23 2151 05/22/23 2215       Objective: Vitals:   05/23/23 0430 05/23/23 0733  BP: (!) 126/52 (!) 112/56  Pulse: 77 77  Resp: 19 18  Temp: 98.7 F (37.1 C) 99 F (37.2 C)  SpO2: 98% 96%    Intake/Output Summary (Last 24 hours) at 05/23/2023 1345 Last data filed at 05/23/2023 1048 Gross per 24 hour  Intake 1101.21 ml  Output 800 ml  Net 301.21 ml   Filed Weights   05/22/23 2350  Weight: 86.5 kg   Weight change:  Body mass index is 25.16 kg/m.   Physical Exam: General exam: Pleasant, elderly Caucasian male.  Not in pain or distress Skin: No rashes, lesions or ulcers. HEENT: Atraumatic, normocephalic, no obvious bleeding Lungs: Clear to  auscultation bilaterally,  CVS: S1, S2, no murmur,   GI/Abd: Soft, nontender, nondistended, bowel sound present,   CNS: Alert, awake, very hard of hearing. Psychiatry: Mood appropriate,  Extremities: No pedal edema, no calf tenderness,   Data Review: I have personally reviewed the laboratory data and studies available.  F/u labs ordered Unresulted Labs (From admission, onward)     Start     Ordered   05/24/23 0500  Basic metabolic panel  Tomorrow morning,   R        05/23/23 1345   05/24/23 0500  CBC with Differential/Platelet  Tomorrow morning,   R        05/23/23 1345   05/22/23 2146  Blood culture (routine x 2)  BLOOD CULTURE X 2,   R (with STAT occurrences)      05/22/23 2145            Total time spent in review of labs and imaging, patient evaluation, formulation of plan, documentation and communication with family: 55 minutes  Signed, Lorin Glass, MD [facility] 05/23/2023

## 2023-05-23 NOTE — Evaluation (Signed)
 Occupational Therapy Evaluation Patient Details Name: Samuel Little MRN: [id number] DOB: [birth year] Today's Date: 05/23/2023   History of Present Illness   89 yo lower back/ flank pain. Recent UTI with treatment.  UTI with hematuria CT (+) renal stone study showing bilateral nonobstructive intrarenal calculi without hydronephrosis. PMH back pain hematuria HTN pacytopenia goud hypthyroidism HOH with hearing aides macular degeneration, RBBB wear glasses R TKA, R rotator cuff repair,     Clinical Impressions PT admitted with UTI with hematuria. Pt currently with functional limitiations due to the deficits listed below (see OT problem list). Pt living at home with wife using RW for transfers. Pt reports prior home health services and no longer needing services. Pt at this time demonstrates decreased ability to complete sit<>Stand without DME and elevated surface. Pt c/o 4-5 days without bowel movement and eyes burning. MD and RN made aware of needs.  Pt will benefit from skilled OT to increase their independence and safety with adls and balance to allow discharge skilled inpatient follow up therapy, <3 hours/day. Pt lives with elderly wife and will need more help that wife can provide at this time for transfers. No family present to fully explore family ability to help at home.      If plan is discharge home, recommend the following:   Two people to help with walking and/or transfers;Two people to help with bathing/dressing/bathroom     Functional Status Assessment   Patient has had a recent decline in their functional status and demonstrates the ability to make significant improvements in function in a reasonable and predictable amount of time.     Equipment Recommendations   Wheelchair (measurements OT);Wheelchair cushion (measurements OT);Hospital bed;Hoyer lift     Recommendations for Other Services   PT consult     Precautions/Restrictions   Precautions Precautions:  Fall Recall of Precautions/Restrictions: Intact Restrictions Weight Bearing Restrictions Per Provider Order: No     Mobility Bed Mobility Overal bed mobility: Needs Assistance Bed Mobility: Supine to Sit     Supine to sit: Max assist     General bed mobility comments: pt needed cues for back precaution and (A) to lift bil LE onto bed surface    Transfers Overall transfer level: Needs assistance   Transfers: Sit to/from Stand Sit to Stand: Max assist           General transfer comment: elevated surface measured at 26 inches Transfer via Lift Equipment: Stedy    Balance Overall balance assessment: Mild deficits observed, not formally tested                                         ADL either performed or assessed with clinical judgement   ADL Overall ADL's : Needs assistance/impaired Eating/Feeding: Independent Eating/Feeding Details (indicate cue type and reason): sitting eob Grooming: Oral care;Wash/dry face;Set up;Sitting Grooming Details (indicate cue type and reason): in stedy at sink with (A) to hand over hand apply tooth paste to the brush. pt requires use of cup to spit due to sitting in steady                 Toilet Transfer: Maximal assistance Toilet Transfer Details (indicate cue type and reason): elevated surface 26 inches with stedy           General ADL Comments: observed opening containers. pt thought orange juice is apple sauce initially  Vision Baseline Vision/History: 1 Wears glasses Ability to See in Adequate Light: 2 Moderately impaired Vision Assessment?: Vision impaired- to be further tested in functional context Additional Comments: baseline macular degeneration, reports eyes are burning and wants eye drops,     Perception         Praxis         Pertinent Vitals/Pain Pain Assessment Pain Assessment: Faces Faces Pain Scale: Hurts whole lot Pain Location: L flank and low back, L side of neck Pain  Descriptors / Indicators: Guarding, Grimacing, Discomfort, Sharp, Constant Pain Intervention(s): Monitored during session, Repositioned, Limited activity within patient's tolerance     Extremity/Trunk Assessment Upper Extremity Assessment Upper Extremity Assessment: Generalized weakness   Lower Extremity Assessment Lower Extremity Assessment: Generalized weakness   Cervical / Trunk Assessment Cervical / Trunk Assessment: Kyphotic;Other exceptions (reports pain is worse with attempts to stand but hurts laying or standing) Cervical / Trunk Exceptions: reports pain in left side of neck - "i dont know if i slept on it wrong or what"   Communication Communication Communication: Impaired Factors Affecting Communication: Hearing impaired (wears hearing aides)   Cognition Arousal: Alert Behavior During Therapy: WFL for tasks assessed/performed Cognition: No apparent impairments, No family/caregiver present to determine baseline                               Following commands: Intact       Cueing  General Comments   Cueing Techniques: Visual cues (reads lips)  sit<>Stand attempt x3 with 02 86% and DOE 3 out 4. pt with rest break bed surface elevated and pt asking to try one more time. pt able to achieve standing without change in O2.   Exercises     Shoulder Instructions      Home Living Family/patient expects to be discharged to:: Private residence Living Arrangements: Spouse/significant other Available Help at Discharge: Family;Available 24 hours/day Type of Home: House Home Access: Stairs to enter [employer] of Steps: 1 threshold   Home Layout: One level;Other (Comment) (2 steps inside with 2 rails to get to bedrooms from kitchen)     Bathroom Shower/Tub: Producer, television/film/video: Handicapped height Bathroom Accessibility: Yes   Home Equipment: Teacher, English as a foreign language (2 wheels);Shower seat;Grab bars - tub/shower   Additional  Comments: son lives 3 blocks away and can help as well      Prior Functioning/Environment Prior Level of Function : Independent/Modified Independent             Mobility Comments: RW recently due to pain from UTI but prior was using a walking stick      OT Problem List: Decreased strength;Decreased activity tolerance;Impaired balance (sitting and/or standing);Decreased safety awareness;Decreased knowledge of use of DME or AE;Decreased knowledge of precautions;Cardiopulmonary status limiting activity;Pain   OT Treatment/Interventions: Self-care/ADL training;Therapeutic exercise;Energy conservation;DME and/or AE instruction;Manual therapy;Therapeutic activities;Patient/family education;Balance training      OT Goals(Current goals can be found in the care plan section)   Acute Rehab OT Goals Patient Stated Goal: to lay back down OT Goal Formulation: With patient Time For Goal Achievement: 06/06/23 Potential to Achieve Goals: Good   OT Frequency:  Min 2X/week    Co-evaluation              AM-PAC OT "6 Clicks" Daily Activity     Outcome Measure Help from another person eating meals?: None Help from another person taking care of personal grooming?: A Little  Help from another person toileting, which includes using toliet, bedpan, or urinal?: A Lot Help from another person bathing (including washing, rinsing, drying)?: A Lot Help from another person to put on and taking off regular upper body clothing?: A Lot Help from another person to put on and taking off regular lower body clothing?: A Lot 6 Click Score: 15   End of Session Nurse Communication: Precautions;Mobility status;Need for lift equipment  Activity Tolerance: Patient limited by pain Patient left: in bed;with call bell/phone within reach;with bed alarm set  OT Visit Diagnosis: Unsteadiness on feet (R26.81);Muscle weakness (generalized) (M62.81);Pain Pain - Right/Left: Left                Time: 0981-1914 OT  Time Calculation (min): 52 min Charges:  OT General Charges $OT Visit: 1 Visit OT Evaluation $OT Eval Moderate Complexity: 1 Mod OT Treatments $Self Care/Home Management : 23-37 mins   Brynn, OTR/L  Acute Rehabilitation Services Office: [phone] .   Mateo Flow 05/23/2023, 9:44 AM

## 2023-05-23 NOTE — Evaluation (Signed)
 Physical Therapy Evaluation Patient Details Name: Samuel Little MRN: [id number] DOB: [birth year] Today's Date: 05/23/2023  History of Present Illness  89 yo lower back/ flank pain. Recent UTI with treatment.  UTI with hematuria CT (+) renal stone study showing bilateral nonobstructive intrarenal calculi without hydronephrosis. PMH: back pain, hematuria, HTN, pancytopenia, gout, hypthyroidism, HOH with hearing aides, macular degeneration, RBBB, wear glasses R TKA, R rotator cuff repair,  Clinical Impression  Pt admitted with above diagnosis. Pt from home with wife and son lives nearby. He reports he was able to ambulate with RW until a few days ago when back and neck pain began to increase from baseline. Pt currently limited by pain, weakness, and impaired balance. Needed max A to come to EOB and max A+2 to stand. Pt unable to ambulate today. Patient will benefit from continued inpatient follow up therapy, <3 hours/day.   Pt currently with functional limitations due to the deficits listed below (see PT Problem List). Pt will benefit from acute skilled PT to increase their independence and safety with mobility to allow discharge.           If plan is discharge home, recommend the following: Two people to help with walking and/or transfers;Two people to help with bathing/dressing/bathroom;Assistance with cooking/housework;Help with stairs or ramp for entrance;Assist for transportation   Can travel by private vehicle   No    Equipment Recommendations None recommended by PT  Recommendations for Other Services       Functional Status Assessment Patient has had a recent decline in their functional status and demonstrates the ability to make significant improvements in function in a reasonable and predictable amount of time.     Precautions / Restrictions Precautions Precautions: Fall Recall of Precautions/Restrictions: Intact Restrictions Weight Bearing Restrictions Per Provider Order: No       Mobility  Bed Mobility Overal bed mobility: Needs Assistance Bed Mobility: Supine to Sit, Sit to Supine     Supine to sit: Max assist Sit to supine: Max assist, +2 for physical assistance   General bed mobility comments: max A for LE's off EOB and elevation of trunk into sitting, increased pain with all mvmt. Max A +2 given for return to supine in part to minimize pain    Transfers Overall transfer level: Needs assistance Equipment used: Ambulation equipment used Transfers: Sit to/from Stand Sit to Stand: Max assist, From elevated surface, Via lift equipment, +2 physical assistance           General transfer comment: pt needed max A for power up and support as he stepped feet under him on stedy. Mod A +2 to stand from flaps of stedy for strengthening Transfer via Lift Equipment: Stedy  Ambulation/Gait               General Gait Details: unable at this time due to pain and weakness  Stairs            Wheelchair Mobility     Tilt Bed    Modified [name] (Stroke Patients Only)       Balance Overall balance assessment: Needs assistance Sitting-balance support: Feet supported, Bilateral upper extremity supported Sitting balance-Leahy Scale: Fair     Standing balance support: During functional activity, Reliant on assistive device for balance, Bilateral upper extremity supported Standing balance-Leahy Scale: Zero Standing balance comment: max support needed to maintain standing and pt unable to ext hips into fully erect posture  Pertinent Vitals/Pain Pain Assessment Pain Assessment: Faces Faces Pain Scale: Hurts whole lot Pain Location: L flank and low back, L side of neck Pain Descriptors / Indicators: Guarding, Grimacing, Discomfort, Sharp, Constant Pain Intervention(s): Limited activity within patient's tolerance, Monitored during session, Ice applied, Heat applied    Home Living Family/patient expects to  be discharged to:: Private residence Living Arrangements: Spouse/significant other Available Help at Discharge: Family;Available 24 hours/day Type of Home: House Home Access: Stairs to enter Entrance Stairs-Rails:  (B posts) Secretary/administrator of Steps: 1 threshold   Home Layout: One level;Other (Comment) (2 steps inside with 2 rails to get to bedrooms from kitchen) Home Equipment: BSC/3in1;Rolling Walker (2 wheels);Shower seat;Grab bars - tub/shower Additional Comments: pt lives with wife, son lives 3 blocks away and can help as well    Prior Function Prior Level of Function : Independent/Modified Independent             Mobility Comments: RW recently due to pain from UTI but prior was using a walking stick ADLs Comments: independent, drives short distances, son drives longer trips or at night     Extremity/Trunk Assessment   Upper Extremity Assessment Upper Extremity Assessment: Defer to OT evaluation    Lower Extremity Assessment Lower Extremity Assessment: Generalized weakness    Cervical / Trunk Assessment Cervical / Trunk Assessment: Kyphotic;Other exceptions (reports pain is worse with attempts to stand but hurts laying or standing) Cervical / Trunk Exceptions: L neck pain is new, LBP is chronic but currently worse than his baseline  Communication   Communication Communication: Impaired Factors Affecting Communication: Hearing impaired (wears hearing aides)    Cognition Arousal: Alert Behavior During Therapy: WFL for tasks assessed/performed   PT - Cognitive impairments: No apparent impairments                       PT - Cognition Comments: pt very HOH but when he is understanding questions answers appropriately. Can give detailed PLOF with timeline Following commands: Intact       Cueing Cueing Techniques: Visual cues (reads lips)     General Comments General comments (skin integrity, edema, etc.): Worked on extending elbows when seated on  stedy flaps for trunk extension. This gave him some relief of his LBP but neck pain was worse in upright    Exercises     Assessment/Plan    PT Assessment Patient needs continued PT services  PT Problem List Decreased strength;Decreased activity tolerance;Decreased balance;Decreased mobility;Pain       PT Treatment Interventions DME instruction;Gait training;Functional mobility training;Therapeutic activities;Therapeutic exercise;Balance training;Neuromuscular re-education;Patient/family education    PT Goals (Current goals can be found in the Care Plan section)  Acute Rehab PT Goals Patient Stated Goal: decrease pain, get mobile PT Goal Formulation: With patient Time For Goal Achievement: 06/06/23 Potential to Achieve Goals: Good    Frequency Min 1X/week     Co-evaluation               AM-PAC PT "6 Clicks" Mobility  Outcome Measure Help needed turning from your back to your side while in a flat bed without using bedrails?: A Lot Help needed moving from lying on your back to sitting on the side of a flat bed without using bedrails?: A Lot Help needed moving to and from a bed to a chair (including a wheelchair)?: Total Help needed standing up from a chair using your arms (e.g., wheelchair or bedside chair)?: Total Help needed to walk in hospital  room?: Total Help needed climbing 3-5 steps with a railing? : Total 6 Click Score: 8    End of Session Equipment Utilized During Treatment: Gait belt Activity Tolerance: Patient limited by pain Patient left: in bed;with call bell/phone within reach;with bed alarm set Nurse Communication: Mobility status;Need for lift equipment PT Visit Diagnosis: Muscle weakness (generalized) (M62.81);Difficulty in walking, not elsewhere classified (R26.2);Pain;Unsteadiness on feet (R26.81) Pain - Right/Left: Left Pain - part of body:  (back and neck)    Time: 1205-1228 PT Time Calculation (min) (ACUTE ONLY): 23 min   Charges:   PT  Evaluation $PT Eval Moderate Complexity: 1 Mod PT Treatments $Therapeutic Activity: 8-22 mins PT General Charges $$ ACUTE PT VISIT: 1 Visit         Lyanne Co, PT  Acute Rehab Services Secure chat preferred Office [phone]   Lawana Chambers Markie Frith 05/23/2023, 1:56 PM

## 2023-05-23 NOTE — Consult Note (Signed)
 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 - Patient presented with fever no leukocytosis.  On record review it was noted that he had positive urine cultures with MSSA on 2/27 for which she received Macrobid x 10 days. - CT renal study showed bilateral nonobstructing intrarenal calculi, possible diverticulitis, small left pleural effusion, small bilateral inguinal hernias. - Ultrasound renal showed echogenic material in the urinary bladder which could be's sequela of recent infection. - Blood cultures from admission grew MSSA.  Urine cultures growing 100 K colonies Staph aureus, GNR 40K - I spoke to patient and wife.  Wife states that he bruises easily.  No recent episodes of rash, open cuts or wounds.  No recent dental procedures, Recommendations:  -Continue cefazolin - Repeat blood cultures tomorrow - TTE, I suspect he will need a TEE as etiology of bacteremia is unclear. Especially, since his urine cultures on 2/27 grew MSSA: which means he may have been bacteremic at that point as MSSA in urine is generally regarded as overflow. -MRI thoracic and lumbar spine - Orthopantogram  Evaluation of this patient requires complex antimicrobial therapy evaluation and counseling + isolation needs for disease transmission risk assessment and mitigation   Microbiology:    Antibiotics:   Cultures: Blood 3/10 MSSA Urine 3/10 MSSA and gram-negative rods 2/27 MSSA Other   HPI: Samuel Little is a 89 y.o. male with past medical history of hypertension, hypothyroidism, pancytopenia followed by oncology status post bone marrow biopsy with findings suspicious for drug-induced versus autoimmune induced abnormality on prednisone refer to rheumatology, gout, lumbar spinal stenosis, neurogenic claudication, presented with low back pain and hematuria.  Wife stated that she patient was in her usual state of health until night before admission when he had back pain radiating lower extremities.  Of note he has been treated for "UTI with MSSA" on urine cultures with Macrobid x 10 days completed 2 days prior to admission.  On arrival patient was afebrile WBC 9.4K.  CT renal study showed bilateral nonobstructing intrarenal calculi without hydronephrosis small bilateral inguinal hernias containing fat and small amount of fluid.,  Possible diverticulitis.   Review of Systems: Review of Systems  All other systems reviewed and are negative.   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 , march 2,2011    Arthritis    Diverticulosis    Effusion, pericardium    Gastroesophageal reflux disease    Gout    Hard of hearing    wears bilateral hearing aids   History of kidney stones    Hypertension    Hypothyroidism    Macular degeneration    Pleural effusion, left    Right bundle branch block    Thyroid disease    hypothyroidism   Wears glasses     Social History  Tobacco Use   Smoking status: Never   Smokeless tobacco: Never  Substance Use Topics   Alcohol use: No   Drug use: No    Family History  Problem Relation Age of Onset   Hypertension Mother    Diabetes Mother    Hypertension Father    Heart attack Father    Diabetes Father    Heart attack  Brother    Neuropathy Brother    Heart attack Paternal Uncle    Heart attack Paternal Uncle    Heart attack Paternal Uncle    Other Son        Leak in aorta   Scheduled Meds:  levothyroxine  150 mcg Oral QAC breakfast   lidocaine  1 patch Transdermal Q24H   predniSONE  20 mg Oral Q breakfast   Continuous Infusions:  sodium chloride 75 mL/hr at 05/23/23 1452    ceFAZolin (ANCEF) IV 2 g (05/23/23 2150)   PRN Meds:.acetaminophen **OR** acetaminophen, naphazoline-glycerin, oxyCODONE, polyethylene glycol Allergies  Allergen Reactions   Nsaids Other (See Comments)    JAUNDICE   Celecoxib Nausea And Vomiting   Meperidine Hcl Nausea Only    OBJECTIVE: Blood pressure 138/70, pulse 74, temperature 98.1 F (36.7 C), resp. rate 16, height 6\' 1"  (1.854 m), weight 86.5 kg, SpO2 97%.  Physical Exam  Lab Results Lab Results  Component Value Date   WBC 7.3 05/23/2023   HGB 8.9 (L) 05/23/2023   HCT 26.2 (L) 05/23/2023   MCV 101.2 (H) 05/23/2023   PLT 25 (LL) 05/23/2023    Lab Results  Component Value Date   CREATININE 1.44 (H) 05/23/2023   BUN 38 (H) 05/23/2023   NA 137 05/23/2023   K 3.9 05/23/2023   CL 106 05/23/2023   CO2 20 (L) 05/23/2023    Lab Results  Component Value Date   ALT 36 05/11/2023   AST 29 05/11/2023   ALKPHOS 179 (H) 05/11/2023   BILITOT 1.2 05/11/2023       Danelle Earthly, MD [facility] 05/23/2023, 11:18 PM

## 2023-05-23 NOTE — Plan of Care (Signed)

## 2023-05-24 ENCOUNTER — Inpatient Hospital Stay (HOSPITAL_COMMUNITY)

## 2023-05-24 ENCOUNTER — Telehealth: Payer: Self-pay | Admitting: Internal Medicine

## 2023-05-24 DIAGNOSIS — N179 Acute kidney failure, unspecified: Secondary | ICD-10-CM | POA: Diagnosis present

## 2023-05-24 DIAGNOSIS — I38 Endocarditis, valve unspecified: Secondary | ICD-10-CM

## 2023-05-24 DIAGNOSIS — N390 Urinary tract infection, site not specified: Secondary | ICD-10-CM | POA: Diagnosis not present

## 2023-05-24 DIAGNOSIS — M48062 Spinal stenosis, lumbar region with neurogenic claudication: Secondary | ICD-10-CM | POA: Diagnosis present

## 2023-05-24 LAB — ECHOCARDIOGRAM COMPLETE
AR max vel: 2.18 cm2
AV Area VTI: 2.33 cm2
AV Area mean vel: 2.1 cm2
AV Mean grad: 6 mmHg
AV Peak grad: 12 mmHg
Ao pk vel: 1.73 m/s
Area-P 1/2: 3.4 cm2
Calc EF: 58.9 %
MV VTI: 3.46 cm2
S' Lateral: 3.7 cm
Single Plane A2C EF: 55.4 %
Single Plane A4C EF: 56.9 %

## 2023-05-24 LAB — CBC WITH DIFFERENTIAL/PLATELET
Abs Immature Granulocytes: 0.11 10*3/uL — ABNORMAL HIGH (ref 0.00–0.07)
Basophils Absolute: 0 10*3/uL (ref 0.0–0.1)
Basophils Relative: 0 %
Eosinophils Absolute: 0 10*3/uL (ref 0.0–0.5)
Eosinophils Relative: 1 %
HCT: 24.6 % — ABNORMAL LOW (ref 39.0–52.0)
Hemoglobin: 8.2 g/dL — ABNORMAL LOW (ref 13.0–17.0)
Immature Granulocytes: 2 %
Lymphocytes Relative: 7 %
Lymphs Abs: 0.4 10*3/uL — ABNORMAL LOW (ref 0.7–4.0)
MCH: 33.6 pg (ref 26.0–34.0)
MCHC: 33.3 g/dL (ref 30.0–36.0)
MCV: 100.8 fL — ABNORMAL HIGH (ref 80.0–100.0)
Monocytes Absolute: 0.6 10*3/uL (ref 0.1–1.0)
Monocytes Relative: 11 %
Neutro Abs: 4.7 10*3/uL (ref 1.7–7.7)
Neutrophils Relative %: 79 %
Platelets: 21 10*3/uL — CL (ref 150–400)
RBC: 2.44 MIL/uL — ABNORMAL LOW (ref 4.22–5.81)
RDW: 21.2 % — ABNORMAL HIGH (ref 11.5–15.5)
WBC: 5.9 10*3/uL (ref 4.0–10.5)
nRBC: 1 % — ABNORMAL HIGH (ref 0.0–0.2)

## 2023-05-24 LAB — BASIC METABOLIC PANEL
Anion gap: 3 — ABNORMAL LOW (ref 5–15)
BUN: 43 mg/dL — ABNORMAL HIGH (ref 8–23)
CO2: 23 mmol/L (ref 22–32)
Calcium: 8.4 mg/dL — ABNORMAL LOW (ref 8.9–10.3)
Chloride: 107 mmol/L (ref 98–111)
Creatinine, Ser: 1.5 mg/dL — ABNORMAL HIGH (ref 0.61–1.24)
GFR, Estimated: 45 mL/min — ABNORMAL LOW (ref >60)
Glucose, Bld: 131 mg/dL — ABNORMAL HIGH (ref 70–99)
Potassium: 4.3 mmol/L (ref 3.5–5.1)
Sodium: 133 mmol/L — ABNORMAL LOW (ref 135–145)

## 2023-05-24 MED ORDER — PERFLUTREN LIPID MICROSPHERE
1.0000 mL | INTRAVENOUS | Status: DC | PRN
  Administered 2023-05-24: 2 mL via INTRAVENOUS

## 2023-05-24 MED ORDER — LIDOCAINE 5 % EX PTCH
1.0000 | MEDICATED_PATCH | CUTANEOUS | Status: DC
  Administered 2023-05-25 – 2023-05-29 (×5): 1 via TRANSDERMAL
  Filled 2023-05-24 (×5): qty 1

## 2023-05-24 NOTE — Plan of Care (Signed)

## 2023-05-24 NOTE — TOC Initial Note (Addendum)
 Transition of Care [facility]) - Initial/Assessment Note    Patient Details  Name: Samuel Little MRN: [id number] Date of Birth: 1932/11/07  [facility]) CM/SW Contact:    Marliss Coots, LCSW Phone Number: 05/24/2023, 10:07 AM  Clinical Narrative:                  10:07 AM CSW introduced herself and role to patient at bedside. CSW informed patient of therapy recommendation of discharge to SNF. Patient expressed agreeance with discharge to SNF [facility]) and requested CSW to discuss SNF with wife, as well. CSW called patient's spouse, Samuel Little, who also expressed agreeance with patient discharge to SNF (within [facility]). Patient and spouse consented CSW to submit referrals to SNFs within [facility].  4:13 PM CSW returned to patient's bedside to provide current SNF options [facility], [address], [place]igha Bridegroom, [address], [address], [address], [place], [place], [place] [facility]) with their [employer]. Patient's spouse, Samuel Little, and son were also present at bedside. Samuel Little and patient accepted bed offer at [facility]. CSW informed SNF of acceptance. Samuel Little requested CSW to call insurance company regarding Medicare advantage SNF benefits. CSW accepted Lillian's request.  4:43 PM CSW called patient's insurance company who informed CSW that patient does not have SNF copays the first 100 days at [facility]. CSW attempted to relayed information to patient's spouse, Samuel Little, but there was no response and a voicemail was left.  Expected Discharge Plan: Skilled Nursing Facility Barriers to Discharge: SNF Pending bed offer, Continued Medical Work up   Patient Goals and CMS Choice Patient states their goals for this hospitalization and ongoing recovery are:: SNF          Expected Discharge Plan and Services In-house Referral: Clinical Social Work   Post Acute Care Choice: Skilled Nursing Facility Living arrangements for the past 2 months:  Single Family Home                                      Prior Living Arrangements/Services Living arrangements for the past 2 months: Single Family Home Lives with:: Spouse Patient language and need for interpreter reviewed:: Yes        Need for Family Participation in Patient Care: Yes (Comment) Care giver support system in place?: Yes (comment)   Criminal Activity/Legal Involvement Pertinent to Current Situation/Hospitalization: No - Comment as needed  Activities of Daily Living   ADL Screening (condition at time of admission) Independently performs ADLs?: No Does the patient have a NEW difficulty with bathing/dressing/toileting/self-feeding that is expected to last >3 days?: Yes (Initiates electronic notice to provider for possible OT consult) Does the patient have a NEW difficulty with getting in/out of bed, walking, or climbing stairs that is expected to last >3 days?: Yes (Initiates electronic notice to provider for possible PT consult) Does the patient have a NEW difficulty with communication that is expected to last >3 days?: No Is the patient deaf or have difficulty hearing?: Yes Does the patient have difficulty seeing, even when wearing glasses/contacts?: No Does the patient have difficulty concentrating, remembering, or making decisions?: No  Permission Sought/Granted Permission sought to share information with : Family Supports, Oceanographer granted to share information with : Yes, Verbal Permission Granted  Share Information with NAME: Samuel Little  Permission granted to share info w AGENCY: SNF  Permission granted to share info w Relationship: Spouse  Permission granted to share info w Contact Information: [phone]  Emotional Assessment Appearance:: Appears stated age Attitude/Demeanor/Rapport: Engaged Affect (typically observed): Appropriate, Accepting, Adaptable, Stable, Pleasant, Calm Orientation: : Oriented to Self,  Oriented to Place, Oriented to  Time, Oriented to Situation Alcohol / Substance Use: Not Applicable Psych Involvement: No (comment)  Admission diagnosis:  Thrombocytopenia (HCC) [D69.6] Acute UTI [N39.0] Acute cystitis with hematuria [N30.01] Acute bilateral low back pain without sciatica [M54.50] Patient Active Problem List   Diagnosis Date Noted   Acute UTI 05/22/2023   Other pancytopenia (HCC) 10/24/2022   Parapneumonic effusion 09/11/2022   Pleuritic chest pain 09/10/2022   Normocytic anemia 09/10/2022   CAP (community acquired pneumonia) 09/09/2022   S/P total knee replacement 02/13/2017   Hyponatremia 06/25/2016   Prolonged QT interval 06/25/2016   Acute cholecystitis 06/24/2016   Diverticulitis of cecum 12/20/2013   BPH (benign prostatic hyperplasia) 12/20/2013   Bacteremia due to Escherichia coli 12/19/2013   Gout 12/18/2013   Sepsis (HCC) 12/18/2013   PSVT 04/23/2009   Hypothyroidism 04/22/2009   Essential hypertension 04/22/2009   GERD 04/22/2009   Arthropathy 04/22/2009   PCP:  Georgann Housekeeper, MD Pharmacy:   [id number] - [place], [place] - [address] [place] [address] Phone: [phone] Fax: [phone]     Social Drivers of Health (SDOH) Social History: SDOH Screenings   Food Insecurity: No Food Insecurity (05/22/2023)  Housing: Low Risk  (05/22/2023)  Transportation Needs: No Transportation Needs (05/22/2023)  Utilities: Not At Risk (05/22/2023)  Social Connections: Moderately Integrated (05/23/2023)  Tobacco Use: Low Risk  (05/22/2023)   SDOH Interventions:     Readmission Risk Interventions     No data to display

## 2023-05-24 NOTE — Telephone Encounter (Signed)
 Canceled appointments due to the patient being in the hospital.

## 2023-05-24 NOTE — Progress Notes (Addendum)
 Samuel Little   DOB:Apr 10, 1932   ZO#:[id number]      ASSESSMENT & PLAN:   Thrombocytopenia - History of pancytopenia likely secondary to medication or autoimmune disorder due to elevated rheumatoid factor. - Patient has had previous extensive studies that were unremarkable.-Bone marrow biopsy and aspirate performed performed August 2024 showed no underlying MDS or MPN.  The findings were suspected to be secondary to drugs/toxins or autoimmune disorder. - Platelet count low 21K today. - Platelets were 31K on admission 2 days ago.  It is noted patient had drop in platelets 6 months ago 102K and has steadily declined. -Transfuse platelets for counts <20 K or <50 K with active bleeding. - Patient has had extensive studies - Monitor CBC with differential - Heme/Onc Dr. Arbutus Ped following.  Of note patient had appointment to be seen in outpatient hematology office today.  Appointment will be rescheduled upon discharge.  Anemia, macrocytic - Hemoglobin 8.2 today - May be multifactorial due to infection and chronic disease - Transfuse PRBC for Hgb <7.0. - No transfusional intervention recommended at this time - Monitor CBC with differential  Acute UTI Recent MSSA UTI Splenomegaly - Patient recently completed Macrobid x 10 days - Presented 2 days ago with complaints of hematuria and bilateral flank pain - Blood culture growing MSSA in 1 of 2 bottles - Continue antibiotics as ordered - Renal ultrasound on 05/22/2023 shows bilateral nonobstructing renal stone.  Splenomegaly also seen - ID following  Bilateral flank pain - Patient c/o of left lower back and flank pain radiating down to his lower extremities.  Also complained of gross hematuria with burning and hesitancy upon urination.  Patient had UTI 2 weeks ago and was treated with Macrobid x 10 days which has already been completed.admitted with complaints of worsening bilateral flank pain - MRI spine done today shows no acute abnormality of  thoracic or lumbar spine. - Continue pain management as ordered  AKI - Elevated creatinine 1.5 with elevated BUN - Continue gentle hydration - Avoid nephrotoxic agents  Hard of hearing - Continue supportive care  Hypertension Hypothyroidism - Continue to monitor blood pressure levels - On Synthroid 150 mcg daily    Code Status Full  Subjective:  Patient seen awake and alert laying in bed eating well.  He is very hard of hearing.  Patient's wife is principal historian during this assessment.  She recounts about 1 month ago his legs were very itchy and he scratches so much that he began to bleed profusely and he could not stop the bleeding x 1 day.  Patient had UTI and was treated with Macrobid x 10 days.  No other acute distress is noted.  Objective:  Vitals:   05/24/23 0324 05/24/23 0745  BP: (!) 122/46 (!) 120/57  Pulse: 79 84  Resp: 18   Temp: 98 F (36.7 C) 98.6 F (37 C)  SpO2: 97% 97%     Intake/Output Summary (Last 24 hours) at 05/24/2023 1628 Last data filed at 05/24/2023 9811 Gross per 24 hour  Intake 614.68 ml  Output 800 ml  Net -185.32 ml     REVIEW OF SYSTEMS:   Constitutional: + Bilateral flank pain, denies fevers, chills or abnormal night sweats Eyes: Denies blurriness of vision, double vision or watery eyes Ears, nose, mouth, throat, and face: + Very hard of hearing Respiratory: Denies cough, dyspnea or wheezes Cardiovascular: Denies palpitation, chest discomfort or lower extremity swelling Gastrointestinal:  Denies nausea, heartburn or change in bowel habits Skin: Denies abnormal  skin rashes Lymphatics: Denies new lymphadenopathy or easy bruising Neurological: Denies numbness, tingling or new weaknesses Behavioral/Psych: Mood is stable, no new changes  All other systems were reviewed with the patient and are negative.  PHYSICAL EXAMINATION: ECOG PERFORMANCE STATUS: 3 - Symptomatic, >50% confined to bed  Vitals:   05/24/23 0324 05/24/23 0745   BP: (!) 122/46 (!) 120/57  Pulse: 79 84  Resp: 18   Temp: 98 F (36.7 C) 98.6 F (37 C)  SpO2: 97% 97%   Filed Weights   05/22/23 2350  Weight: 190 lb 11.2 oz (86.5 kg)    GENERAL: alert, no distress and comfortable SKIN: + Pale skin color, texture, turgor are normal, no rashes or significant lesions EYES: normal, conjunctiva are pink and non-injected, sclera clear OROPHARYNX: no exudate, no erythema and lips, buccal mucosa, and tongue normal  NECK: supple, thyroid normal size, non-tender, without nodularity LYMPH: no palpable lymphadenopathy in the cervical, axillary or inguinal LUNGS: clear to auscultation and percussion with normal breathing effort HEART: regular rate & rhythm and no murmurs and no lower extremity edema ABDOMEN: abdomen soft, non-tender and normal bowel sounds MUSCULOSKELETAL: no cyanosis of digits and no clubbing  PSYCH: alert & oriented x 3 with fluent speech NEURO: no focal motor/sensory deficits   All questions were answered. The patient knows to call the clinic with any problems, questions or concerns.   The total time spent in the appointment was 40 minutes encounter with patient including review of chart and various tests results, discussions about plan of care and coordination of care plan  Samuel Little, Samuel Little 05/24/2023 4:28 PM    Labs Reviewed:  Lab Results  Component Value Date   WBC 5.9 05/24/2023   HGB 8.2 (L) 05/24/2023   HCT 24.6 (L) 05/24/2023   MCV 100.8 (H) 05/24/2023   PLT 21 (LL) 05/24/2023   Recent Labs    09/09/22 0842 09/10/22 0326 10/24/22 1301 05/11/23 1016 05/22/23 1331 05/23/23 0521 05/24/23 0429  NA 137   < > 139 142 135 137 133*  K 3.7   < > 4.0 3.6 3.4* 3.9 4.3  CL 104   < > 107 109 102 106 107  CO2 20*   < > 26 28 21* 20* 23  GLUCOSE 96   < > 86 93 103* 94 131*  BUN 11   < > 13 21 26* 38* 43*  CREATININE 0.74   < > 0.76 0.89 1.30* 1.44* 1.50*  CALCIUM 8.7*   < > 8.8* 8.9 8.5* 8.3* 8.4*  GFRNONAA >60   < >  >60 >60 53* 47* 45*  PROT 6.2*  --  6.5 6.2*  --   --   --   ALBUMIN 2.6*  --  3.2* 3.4*  --   --   --   AST 72*  --  33 29  --   --   --   ALT 69*  --  20 36  --   --   --   ALKPHOS 339*  --  226* 179*  --   --   --   BILITOT 0.8  --  0.9 1.2  --   --   --    < > = values in this interval not displayed.    Studies Reviewed:  ECHOCARDIOGRAM COMPLETE Result Date: 05/24/2023    ECHOCARDIOGRAM REPORT   Patient Name:   Samuel Little Date of Exam: 05/24/2023 Medical Rec #:  [id number]  Height:       73.0 in Accession #:    [id number]     Weight:       190.7 lb Date of Birth:  1932-04-10      BSA:          2.109 m Patient Age:    89 years       BP:           120/57 mmHg Patient Gender: M              HR:           74 bpm. Exam Location:  Inpatient Procedure: 2D Echo, Cardiac Doppler, Color Doppler and Intracardiac            Opacification Agent (Both Spectral and Color Flow Doppler were            utilized during procedure). Indications:    Endocarditis  History:        Patient has prior history of Echocardiogram examinations, most                 recent 09/10/2022. Risk Factors:Hypertension.  Sonographer:    Amy Chionchio Referring Phys: [id number] [facility] IMPRESSIONS  1. Left ventricular ejection fraction, by estimation, is 60 to 65%. The left ventricle has normal function. The left ventricle has no regional wall motion abnormalities. There is mild concentric left ventricular hypertrophy. Left ventricular diastolic parameters were normal.  2. Right ventricular systolic function is normal. The right ventricular size is normal.  3. The mitral valve is degenerative. Trivial mitral valve regurgitation. No evidence of mitral stenosis.  4. Moderate tricuspid stenosis.  5. The aortic valve is tricuspid. There is mild calcification of the aortic valve. Aortic valve regurgitation is trivial. Aortic valve sclerosis/calcification is present, without any evidence of aortic stenosis.  6. Aortic dilatation noted.  There is mild dilatation of the ascending aorta, measuring 43 mm. There is borderline dilatation of the aortic root, measuring 39 mm.  7. The inferior vena cava is normal in size with <50% respiratory variability, suggesting right atrial pressure of 8 mmHg. FINDINGS  Left Ventricle: Left ventricular ejection fraction, by estimation, is 60 to 65%. The left ventricle has normal function. The left ventricle has no regional wall motion abnormalities. Definity contrast agent was given IV to delineate the left ventricular  endocardial borders. The left ventricular internal cavity size was normal in size. There is mild concentric left ventricular hypertrophy. Left ventricular diastolic parameters were normal. Right Ventricle: The right ventricular size is normal. No increase in right ventricular wall thickness. Right ventricular systolic function is normal. Left Atrium: Left atrial size was normal in size. Right Atrium: Right atrial size was normal in size. Pericardium: There is no evidence of pericardial effusion. Mitral Valve: The mitral valve is degenerative in appearance. There is mild calcification of the mitral valve leaflet(s). Trivial mitral valve regurgitation. No evidence of mitral valve stenosis. MV peak gradient, 2.4 mmHg. The mean mitral valve gradient  is 1.0 mmHg. Tricuspid Valve: The tricuspid valve is normal in structure. Tricuspid valve regurgitation is trivial. Moderate tricuspid stenosis. Aortic Valve: The aortic valve is tricuspid. There is mild calcification of the aortic valve. Aortic valve regurgitation is trivial. Aortic valve sclerosis/calcification is present, without any evidence of aortic stenosis. Aortic valve mean gradient measures 6.0 mmHg. Aortic valve peak gradient measures 12.0 mmHg. Aortic valve area, by VTI measures 2.33 cm. Pulmonic Valve: The pulmonic valve was normal in structure. Pulmonic valve regurgitation is  trivial. No evidence of pulmonic stenosis. Aorta: Aortic dilatation  noted. There is mild dilatation of the ascending aorta, measuring 43 mm. There is borderline dilatation of the aortic root, measuring 39 mm. Venous: The inferior vena cava is normal in size with less than 50% respiratory variability, suggesting right atrial pressure of 8 mmHg. IAS/Shunts: No atrial level shunt detected by color flow Doppler.  LEFT VENTRICLE PLAX 2D LVIDd:         5.20 cm      Diastology LVIDs:         3.70 cm      LV e' medial:    9.25 cm/s LV PW:         1.00 cm      LV E/e' medial:  8.7 LV IVS:        1.10 cm      LV e' lateral:   11.00 cm/s LVOT diam:     2.10 cm      LV E/e' lateral: 7.3 LV SV:         77 LV SV Index:   36 LVOT Area:     3.46 cm  LV Volumes (MOD) LV vol d, MOD A2C: 164.0 ml LV vol d, MOD A4C: 143.0 ml LV vol s, MOD A2C: 73.2 ml LV vol s, MOD A4C: 61.6 ml LV SV MOD A2C:     90.8 ml LV SV MOD A4C:     143.0 ml LV SV MOD BP:      97.2 ml RIGHT VENTRICLE             IVC RV Basal diam:  3.30 cm     IVC diam: 2.00 cm RV S prime:     11.40 cm/s TAPSE (M-mode): 1.7 cm LEFT ATRIUM              Index        RIGHT ATRIUM           Index LA Vol (A2C):   109.0 ml 51.70 ml/m  RA Area:     12.20 cm LA Vol (A4C):   93.8 ml  44.49 ml/m  RA Volume:   23.30 ml  11.05 ml/m LA Biplane Vol: 103.0 ml 48.85 ml/m  AORTIC VALVE                     PULMONIC VALVE AV Area (Vmax):    2.18 cm      PV Vmax:       0.86 m/s AV Area (Vmean):   2.10 cm      PV Peak grad:  3.0 mmHg AV Area (VTI):     2.33 cm AV Vmax:           173.00 cm/s AV Vmean:          119.000 cm/s AV VTI:            0.330 m AV Peak Grad:      12.0 mmHg AV Mean Grad:      6.0 mmHg LVOT Vmax:         109.00 cm/s LVOT Vmean:        72.200 cm/s LVOT VTI:          0.222 m LVOT/AV VTI ratio: 0.67  AORTA Ao Root diam: 3.90 cm Ao Asc diam:  4.25 cm MITRAL VALVE MV Area (PHT): 3.40 cm    SHUNTS MV Area VTI:   3.46 cm    Systemic VTI:  0.22 m  MV Peak grad:  2.4 mmHg    Systemic Diam: 2.10 cm MV Mean grad:  1.0 mmHg MV Vmax:       0.78 m/s  MV Vmean:      45.5 cm/s MV Decel Time: 223 msec MV E velocity: 80.70 cm/s MV A velocity: 48.20 cm/s MV E/A ratio:  1.67 Arvilla Meres MD Electronically signed by Arvilla Meres MD Signature Date/Time: 05/24/2023/12:45:28 PM    Final    MR THORACIC SPINE WO CONTRAST Result Date: 05/24/2023 CLINICAL DATA:  Infection EXAM: MRI THORACIC AND LUMBAR SPINE WITHOUT CONTRAST TECHNIQUE: Multiplanar and multiecho pulse sequences of the thoracic and lumbar spine were obtained without intravenous contrast. COMPARISON:  01/18/2022 lumbar spine MRI FINDINGS: MRI THORACIC SPINE FINDINGS Alignment:  Physiologic. Vertebrae: No fracture, evidence of discitis, or bone lesion. Cord:  Normal signal and morphology. Paraspinal and other soft tissues: Negative. Disc levels: T2-3: Small disc bulge without spinal canal stenosis. No other spinal canal or neural foraminal stenosis. MRI LUMBAR SPINE FINDINGS Segmentation:  Standard. Alignment:  Grade 1 retrolisthesis at L1-2, L2-3 and L3-4 Vertebrae:  No fracture, evidence of discitis, or bone lesion. Conus medullaris and cauda equina: Conus extends to the L1 level. Conus and cauda equina appear normal. Paraspinal and other soft tissues: Negative. Disc levels: L1-L2: Small disc bulge with endplate spurring. No spinal canal stenosis. Severe right and mild left neural foraminal stenosis. L2-L3: Small disc bulge. No spinal canal stenosis. No neural foraminal stenosis. L3-L4: Intermediate sized disc bulge with endplate spurring, unchanged. Moderate spinal canal stenosis. Mild right and severe left neural foraminal stenosis. L4-L5: Unchanged mild disc bulge with endplate spurring. Narrowing of both lateral recesses without central spinal canal stenosis. Moderate bilateral neural foraminal stenosis. L5-S1: Intermediate sized disc bulge with mild facet hypertrophy. Unchanged moderate spinal canal stenosis. Severe bilateral neural foraminal stenosis. Visualized sacrum: Normal. IMPRESSION: 1. No  acute abnormality of the thoracic or lumbar spine. 2. Unchanged moderate spinal canal stenosis at L3-4 and L5-S1. 3. Unchanged severe right L1-2, left L3-4 and bilateral L5-S1 neural foraminal stenosis. 4. 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 Electronically Signed   By: Deatra Robinson M.D.   On: 05/24/2023 01:43   MR LUMBAR SPINE WO CONTRAST Result Date: 05/24/2023 CLINICAL DATA:  Infection EXAM: MRI THORACIC AND LUMBAR SPINE WITHOUT CONTRAST TECHNIQUE: Multiplanar and multiecho pulse sequences of the thoracic and lumbar spine were obtained without intravenous contrast. COMPARISON:  01/18/2022 lumbar spine MRI FINDINGS: MRI THORACIC SPINE FINDINGS Alignment:  Physiologic. Vertebrae: No fracture, evidence of discitis, or bone lesion. Cord:  Normal signal and morphology. Paraspinal and other soft tissues: Negative. Disc levels: T2-3: Small disc bulge without spinal canal stenosis. No other spinal canal or neural foraminal stenosis. MRI LUMBAR SPINE FINDINGS Segmentation:  Standard. Alignment:  Grade 1 retrolisthesis at L1-2, L2-3 and L3-4 Vertebrae:  No fracture, evidence of discitis, or bone lesion. Conus medullaris and cauda equina: Conus extends to the L1 level. Conus and cauda equina appear normal. Paraspinal and other soft tissues: Negative. Disc levels: L1-L2: Small disc bulge with endplate spurring. No spinal canal stenosis. Severe right and mild left neural foraminal stenosis. L2-L3: Small disc bulge. No spinal canal stenosis. No neural foraminal stenosis. L3-L4: Intermediate sized disc bulge with endplate spurring, unchanged. Moderate spinal canal stenosis. Mild right and severe left neural foraminal stenosis. L4-L5: Unchanged mild disc bulge with endplate spurring. Narrowing of both lateral recesses without central spinal canal stenosis. Moderate bilateral neural foraminal stenosis. L5-S1: Intermediate sized disc bulge with  mild facet hypertrophy. Unchanged moderate spinal canal  stenosis. Severe bilateral neural foraminal stenosis. Visualized sacrum: Normal. IMPRESSION: 1. No acute abnormality of the thoracic or lumbar spine. 2. Unchanged moderate spinal canal stenosis at L3-4 and L5-S1. 3. Unchanged severe right L1-2, left L3-4 and bilateral L5-S1 neural foraminal stenosis. 4. 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 Electronically Signed   By: Deatra Robinson M.D.   On: 05/24/2023 01:43   US RENAL Result Date: 05/23/2023 CLINICAL DATA:  Acute kidney injury. EXAM: RENAL / URINARY TRACT ULTRASOUND COMPLETE COMPARISON:  None Available. FINDINGS: Right Kidney: Renal measurements: 8.2 cm x 5.3 cm x 4.5 cm = volume: 109.4 mL. Echogenicity within normal limits. A 17.7 mm shadowing echogenic renal calculus is seen within the right kidney. No mass or hydronephrosis visualized. Left Kidney: Renal measurements: 11.8 cm x 4.1 cm x 4.7 cm = volume: 119 mL. Echogenicity within normal limits. An 8.9 mm shadowing echogenic renal calculus is seen within the left kidney. No mass or hydronephrosis visualized. Bladder: Echogenic material is seen within the bladder lumen. Other: It should be noted that the study is technically limited secondary to overlying bowel gas, as per the ultrasound technologist. Of incidental note is the presence of an enlarged spleen (17.4 cm in length). IMPRESSION: 1. Bilateral nonobstructing renal calculi. 2. Echogenic material within the urinary bladder which may represent sequelae associated with recent infection. Correlation with urinalysis is recommended. 3. Splenomegaly. Electronically Signed   By: Aram Candela M.D.   On: 05/23/2023 01:11   CT Renal Stone Study Result Date: 05/22/2023 CLINICAL DATA:  Abdominal/flank pain, stone suspected EXAM: CT ABDOMEN AND PELVIS WITHOUT CONTRAST TECHNIQUE: Multidetector CT imaging of the abdomen and pelvis was performed following the standard protocol without IV contrast. RADIATION DOSE REDUCTION: This exam was  performed according to the departmental dose-optimization program which includes automated exposure control, adjustment of the mA and/or kV according to patient size and/or use of iterative reconstruction technique. COMPARISON:  11/20/2017 FINDINGS: Lower chest: Small left pleural effusion. Linear atelectasis in the left lower lobe. Hepatobiliary: Unremarkable unenhanced appearance of the liver. Clips in the gallbladder fossa postcholecystectomy. No biliary dilatation. Pancreas: No ductal dilatation or inflammation. Spleen: The spleen is enlarged, 15.4 cm cranial caudal. Adrenals/Urinary Tract: No adrenal nodule. There are bilateral nonobstructing intrarenal calculi. No hydronephrosis. Both ureters are decompressed without ureteral stone. The urinary bladder is partially distended, no bladder stone or wall thickening. Stomach/Bowel: Small hiatal hernia. The stomach is nondistended. No small bowel obstruction or inflammatory change. Moderate diffuse colonic diverticulosis. Equivocal faint stranding about a diverticulum involving the distal descending colon series 6, image 81, may represent mild diverticulitis. Moderate volume of stool in the colon. Sigmoid colon is redundant. Vascular/Lymphatic: Aortic atherosclerosis. The aorta is tortuous but nonaneurysmal. No bulky abdominopelvic adenopathy. Reproductive: Enlarged prostate spans 5.9 cm transverse. Other: No ascites. No free air or focal fluid collection small bilateral inguinal hernias contain fat and a small amount of free fluid. Mild body wall edema. Musculoskeletal: The bones are subjectively under mineralized. Diffuse degenerative change throughout the lumbar spine with multilevel degenerative disc disease and facet hypertrophy. No fracture or acute osseous findings. IMPRESSION: 1. Bilateral nonobstructing intrarenal calculi. No ureteral stone or hydronephrosis. 2. Moderate diffuse colonic diverticulosis. Equivocal faint stranding about a diverticulum  involving the distal descending colon, may represent mild diverticulitis. 3. Diffuse degenerative change throughout the spine without acute osseous findings. 4. Small left pleural effusion. 5. Chronic incidental findings include small hiatal hernia. Splenomegaly. Enlarged prostate. 6.  Small bilateral inguinal hernias contain fat and a small amount of free fluid. Aortic Atherosclerosis (ICD10-I70.0). Electronically Signed   By: Narda Rutherford M.D.   On: 05/22/2023 18:29    ADDENDUM: Hematology/Oncology Attending: He is an 89 year old male with pancytopenia who presents with severe lower back pain. He is accompanied by his wife and son.  He has a history of pancytopenia with low white blood count, low platelets, and low hemoglobin. A bone marrow biopsy performed a few months ago did not reveal a cause. His blood counts have been declining recently, with platelets dropping to 25-30,000, although his white blood count has improved. An ultrasound previously showed an enlarged spleen, which may be contributing to his low platelet count. He bruises easily, especially if he hits something, and has dry skin. No epistaxis, gum bleeding, or blood in stool.  He presents with severe lower back pain located between his hips, which began suddenly without any preceding fall or injury. The pain worsened after he ran out of antibiotics and steroids, which he had been taking for a urinary tract infection. His wife reports that he was shaky and had difficulty walking due to the pain. He used a pain patch and oxycodone for pain relief, which allowed him to sleep, but he remained unable to walk.  He was recently treated for a urinary tract infection with a 10-day course of antibiotics, which he completed the day before his symptoms worsened.  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 He wears support hose, which can cause swelling  and are difficult to remove.  Pancytopenia with splenomegaly Chronic pancytopenia with recent decline in blood counts, including leukopenia, thrombocytopenia, and anemia. Previous bone marrow biopsy showed no malignancy. Splenomegaly may contribute to thrombocytopenia due to platelet sequestration and destruction. Current infection exacerbates thrombocytopenia. IVIG is considered to counteract immune-mediated platelet destruction, especially with splenomegaly. - Monitor blood counts, focusing on platelets and hemoglobin. - Continue steroid regimen. - Consider IVIG if platelet count does not improve in 1-2 days. - Monitor for bleeding or bruising. - Review MRI for degenerative changes.  Thrombocytopenia Chronic thrombocytopenia with recent decline in platelet count to 25-30, likely due to splenomegaly and infection. No significant bleeding or bruising. - Monitor platelet count. - Consider IVIG if no improvement.  Anemia Chronic anemia with recent decline in hemoglobin, likely multifactorial due to pancytopenia and infection. - Monitor hemoglobin levels. - Consider transfusion if indicated.  Urinary tract infection (UTI) Recent UTI treated with antibiotics, contributing to hospitalization and affecting platelet counts. - Continue appropriate antibiotic therapy as managed by hospitalist and infectious disease team.  Lower back pain Acute severe lower back pain between hips, partially relieved by OxyContin. - Continue pain management ensuring adequate control.  Thank you for allowing me to participate in the care of Mr. Eckrich.  Please call if you have any questions. Disclaimer: This note was dictated with voice recognition software. Similar sounding words can inadvertently be transcribed and may be missed upon review. Lajuana Matte, MD

## 2023-05-24 NOTE — Progress Notes (Signed)
 Progress Note   Patient: Samuel Little:[id number] DOB: 05-Apr-1932 DOA: 05/22/2023     1 DOS: the patient was seen and examined on 05/24/2023   Brief hospital course: 89yo with h/o HTN, AVNRT s/p ablation, hypothyroidism, pancytopenia, and lumbar stenosis with neurogenic claudication who presented on 3/10 with LBP and hematuria.  He was recently treated for MSSA UTI with 10 days of Macrobid.  CT with B nonobstructing intrarenal calculi without hydronephrosis.  He was found to have MSSA UTI with bacteremia and was started on Ceftriaxone.  Orthopantogram ordered.    Assessment and Plan:  Acute MSSA UTI with hematuria and bacteremia Recent MSSA UTI, completed 10-day course of Macrobid  Presented with hematuria and bilateral flank pain  Urinalysis with bacteria, leukocytes and blood  CT renal study with nonobstructing intrarenal calculi, no hydronephrosis, no suspicious bladder lesions Blood culture is growing MSSA Ceftriaxone -> Cefazolin ID consulting Tylenol every 6 hours as needed, oxycodone 5 mg every 4 hours as needed for pain control   AKI Baseline creatinine 0.8 Presented with creatinine 1.3 in the setting of decreased oral intake  Renal ultrasound showed bilateral nonobstructing renal calculi, echogenic material within the urinary bladder which may have been sequelae associated with recent infection Renal function continues to gradually worsen despite IVF   Hx of pancytopenia Continues to worsen, particularly thrombocytopenia Followed by ID 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 Dr. Shirline Frees, on Thursday (3/13) Also previously with concern that gout medicine (allopurinol) was contributing, but it was stopped about a month ago Continue prednisone 20 mg daily SCDs for DVT ppx Will consult oncology given worsening despite stopping allopurinol and starting prednisone Transfuse for platelets <20, <50 with  bleeding   Lumbar spinal stenosis with neurogenic claudication MRI T/L-spine with unchanged severe R L1-2, L 3-4, and B L5-S1 stenosis heating pad, lidocaine patch, other pain control PT/OT eval -> recommending SNF rehab Patient reports little improvement, consider neurosurgery input if issues are ongoing (previously saw Dr. Lovell Sheehan)   Hypertension BP 112/56-158/68, currently 120/57 Holding doxazosin, lisinopril   Hypothyroidism Continue home Synthroid Lab Results  Component Value Date   TSH 1.348 09/09/2022   Goals of care He reports having a living will [facility] documents indicate a desire for full scope of care with resuscitation However, patient reports that he would prefer to "die naturally" Discussed with wife, changed to DNR He has been accepted to [facility] once he is better    Consultants: ID Oncology PT OT TOC team  Procedures: None  Antibiotics: Ceftriaxone x 2 doses Cefazolin 3/11-  30 Day Unplanned Readmission Risk Score    Flowsheet Row ED to Hosp-Admission (Current) from 05/22/2023 in [facility] GENERAL MED/SURG UNIT  30 Day Unplanned Readmission Risk Score (%) 14.13 Filed at 05/24/2023 0401       This score is the patient's risk of an unplanned readmission within 30 days of being discharged (0 -100%). The score is based on dignosis, age, lab data, medications, orders, and past utilization.   Low:  0-14.9   Medium: 15-21.9   High: 22-29.9   Extreme: 30 and above           Subjective: Continues to have severe LBP, not significantly improved since presentation despite treatment.  Pain is controlled at rest but terrible with movement.   Objective: Vitals:   05/24/23 0324 05/24/23 0745  BP: (!) 122/46 (!) 120/57  Pulse: 79 84  Resp:  18   Temp: 98 F (36.7 C) 98.6 F (37 C)  SpO2: 97% 97%    Intake/Output Summary (Last 24 hours) at 05/24/2023 1632 Last data filed at 05/24/2023 4098 Gross per 24 hour  Intake 614.68 ml  Output  800 ml  Net -185.32 ml   Filed Weights   05/22/23 2350  Weight: 86.5 kg    Exam:  General:  Appears calm and comfortable and is in NAD Eyes:  EOMI, normal lids, iris ENT:   very hard of hearing, grossly normal lips & tongue, mmm Neck:  no LAD, masses or thyromegaly Cardiovascular:  RRR, no m/r/g. No LE edema.  Respiratory:   CTA bilaterally with no wheezes/rales/rhonchi.  Normal respiratory effort. Abdomen:  soft, NT, ND Skin:  no rash or induration seen on limited exam Musculoskeletal:  grossly normal tone BUE/BLE, good ROM, no bony abnormality Psychiatric:  blunted mood and affect, speech fluent and appropriate, AOx3 Neurologic:  CN 2-12 grossly intact, moves all extremities in coordinated fashion  Data Reviewed: I have reviewed the patient's lab results since admission.  Pertinent labs for today include:   Na++ 133 Glucose 131 BUN 43/Creatinine 1.5/GFR 45; 21/0/89/>60 on 2/27 WBC 5.9 Hgb 8.2, down from 9.2 on 3/10, baseline about 9 Platelets 21, down from 31, worsening     Family Communication: None present  Disposition: Status is: Inpatient Remains inpatient appropriate because: ongoing management     Time spent: 50 minutes  Unresulted Labs (From admission, onward)     Start     Ordered   05/25/23 0500  CBC with Differential/Platelet  Tomorrow morning,   R        05/24/23 1632   05/25/23 0500  Basic metabolic panel  Tomorrow morning,   R        05/24/23 1632             Author: Jonah Blue, MD 05/24/2023 4:32 PM  For on call review [email or web address].

## 2023-05-24 NOTE — NC FL2 (Signed)
 [place] MEDICAID FL2 LEVEL OF CARE FORM     IDENTIFICATION  Patient Name: Samuel Little Birthdate: 1932/08/13 Sex: male Admission Date (Current Location): 05/22/2023  [facility] and [place] Number:  Producer, television/film/video and Address:  [facility], [address], [place], [place] [address]      Provider Number: [id number]  Attending Physician Name and Address:  Jonah Blue, MD  Relative Name and Phone Number:  Inocente Krach; Spouse; [phone]    Current Level of Care: Hospital Recommended Level of Care: Skilled Nursing Facility Prior Approval Number:    Date Approved/Denied:   PASRR Number: [id number]  Discharge Plan: SNF    Current Diagnoses: Patient Active Problem List   Diagnosis Date Noted   Acute UTI 05/22/2023   Other pancytopenia (HCC) 10/24/2022   Parapneumonic effusion 09/11/2022   Pleuritic chest pain 09/10/2022   Normocytic anemia 09/10/2022   CAP (community acquired pneumonia) 09/09/2022   S/P total knee replacement 02/13/2017   Hyponatremia 06/25/2016   Prolonged QT interval 06/25/2016   Acute cholecystitis 06/24/2016   Diverticulitis of cecum 12/20/2013   BPH (benign prostatic hyperplasia) 12/20/2013   Bacteremia due to Escherichia coli 12/19/2013   Gout 12/18/2013   Sepsis (HCC) 12/18/2013   PSVT 04/23/2009   Hypothyroidism 04/22/2009   Essential hypertension 04/22/2009   GERD 04/22/2009   Arthropathy 04/22/2009    Orientation RESPIRATION BLADDER Height & Weight     Self, Time, Situation, Place  Normal (Room Air) Continent Weight: 190 lb 11.2 oz (86.5 kg) Height:  6\' 1"  (185.4 cm)  BEHAVIORAL SYMPTOMS/MOOD NEUROLOGICAL BOWEL NUTRITION STATUS      Continent Diet (Please see dc summary)  AMBULATORY STATUS COMMUNICATION OF NEEDS Skin   Extensive Assist Verbally Normal                       Personal Care Assistance Level of Assistance  Bathing, Feeding, Dressing Bathing Assistance: Maximum  assistance Feeding assistance: Maximum assistance Dressing Assistance: Maximum assistance     Functional Limitations Info  Sight, Hearing Sight Info: Impaired Clinical biochemist) Hearing Info: Impaired (R and L (Hearing Aides))      SPECIAL CARE FACTORS FREQUENCY  PT (By licensed PT), OT (By licensed OT)     PT Frequency: 5x OT Frequency: 5x            Contractures      Additional Factors Info  Code Status, Allergies Code Status Info: Full Code Allergies Info: Nsaids; Celecoxib; Meperidine Hcl           Current Medications (05/24/2023):  This is the current hospital active medication list Current Facility-Administered Medications  Medication Dose Route Frequency Provider Last Rate Last Admin   0.9 %  sodium chloride infusion   Intravenous Continuous Dahal, Binaya, MD 75 mL/hr at 05/23/23 1452 New Bag at 05/23/23 1452   acetaminophen (TYLENOL) tablet 650 mg  650 mg Oral Q6H PRN Briscoe Burns, MD   650 mg at 05/24/23 2951   Or   acetaminophen (TYLENOL) suppository 650 mg  650 mg Rectal Q6H PRN Briscoe Burns, MD       ceFAZolin (ANCEF) IVPB 2g/100 mL premix  2 g Intravenous Q8H Della Goo, RPH 200 mL/hr at 05/24/23 0603 2 g at 05/24/23 0603   levothyroxine (SYNTHROID) tablet 150 mcg  150 mcg Oral QAC breakfast Briscoe Burns, MD   150 mcg at 05/24/23 0603   lidocaine (LIDODERM) 5 % 1 patch  1 patch Transdermal Q24H Arby Barrette, MD   1 patch at 05/23/23 1454   naphazoline-glycerin (CLEAR EYES REDNESS) ophth solution 1-2 drop  1-2 drop Both Eyes QID PRN Dahal, Melina Schools, MD       oxyCODONE (Oxy IR/ROXICODONE) immediate release tablet 5 mg  5 mg Oral Q4H PRN Briscoe Burns, MD   5 mg at 05/24/23 0320   polyethylene glycol (MIRALAX / GLYCOLAX) packet 17 g  17 g Oral Daily PRN Dahal, Melina Schools, MD       predniSONE (DELTASONE) tablet 20 mg  20 mg Oral Q breakfast Briscoe Burns, MD   20 mg at 05/24/23 5188     Discharge Medications: Please see discharge summary for  a list of discharge medications.  Relevant Imaging Results:  Relevant Lab Results:   Additional Information SS# [id number]  Marliss Coots, LCSW

## 2023-05-25 ENCOUNTER — Inpatient Hospital Stay: Payer: Medicare Other | Admitting: Internal Medicine

## 2023-05-25 ENCOUNTER — Inpatient Hospital Stay: Payer: Medicare Other

## 2023-05-25 ENCOUNTER — Inpatient Hospital Stay (HOSPITAL_COMMUNITY)

## 2023-05-25 DIAGNOSIS — N390 Urinary tract infection, site not specified: Secondary | ICD-10-CM | POA: Diagnosis not present

## 2023-05-25 DIAGNOSIS — H3530 Unspecified macular degeneration: Secondary | ICD-10-CM | POA: Diagnosis present

## 2023-05-25 LAB — CBC WITH DIFFERENTIAL/PLATELET
Abs Immature Granulocytes: 0 10*3/uL (ref 0.00–0.07)
Basophils Absolute: 0 10*3/uL (ref 0.0–0.1)
Basophils Relative: 0 %
Eosinophils Absolute: 0.1 10*3/uL (ref 0.0–0.5)
Eosinophils Relative: 1 %
HCT: 25.5 % — ABNORMAL LOW (ref 39.0–52.0)
Hemoglobin: 8.5 g/dL — ABNORMAL LOW (ref 13.0–17.0)
Lymphocytes Relative: 9 %
Lymphs Abs: 0.5 10*3/uL — ABNORMAL LOW (ref 0.7–4.0)
MCH: 33.9 pg (ref 26.0–34.0)
MCHC: 33.3 g/dL (ref 30.0–36.0)
MCV: 101.6 fL — ABNORMAL HIGH (ref 80.0–100.0)
Monocytes Absolute: 0.4 10*3/uL (ref 0.1–1.0)
Monocytes Relative: 8 %
Neutro Abs: 4.6 10*3/uL (ref 1.7–7.7)
Neutrophils Relative %: 82 %
Platelets: 21 10*3/uL — CL (ref 150–400)
RBC: 2.51 MIL/uL — ABNORMAL LOW (ref 4.22–5.81)
RDW: 21.4 % — ABNORMAL HIGH (ref 11.5–15.5)
WBC: 5.6 10*3/uL (ref 4.0–10.5)
nRBC: 0.4 % — ABNORMAL HIGH (ref 0.0–0.2)
nRBC: 2 /100{WBCs} — ABNORMAL HIGH

## 2023-05-25 LAB — DIC (DISSEMINATED INTRAVASCULAR COAGULATION)PANEL
D-Dimer, Quant: 3.66 ug{FEU}/mL — ABNORMAL HIGH (ref 0.00–0.50)
Fibrinogen: 494 mg/dL — ABNORMAL HIGH (ref 210–475)
INR: 1.3 — ABNORMAL HIGH (ref 0.8–1.2)
Platelets: 21 10*3/uL — CL (ref 150–400)
Prothrombin Time: 16.5 s — ABNORMAL HIGH (ref 11.4–15.2)
Smear Review: NONE SEEN
aPTT: 33 s (ref 24–36)

## 2023-05-25 LAB — BASIC METABOLIC PANEL
Anion gap: 7 (ref 5–15)
BUN: 37 mg/dL — ABNORMAL HIGH (ref 8–23)
CO2: 22 mmol/L (ref 22–32)
Calcium: 8.4 mg/dL — ABNORMAL LOW (ref 8.9–10.3)
Chloride: 109 mmol/L (ref 98–111)
Creatinine, Ser: 1.03 mg/dL (ref 0.61–1.24)
GFR, Estimated: >60 mL/min (ref >60)
Glucose, Bld: 102 mg/dL — ABNORMAL HIGH (ref 70–99)
Potassium: 3.9 mmol/L (ref 3.5–5.1)
Sodium: 138 mmol/L (ref 135–145)

## 2023-05-25 LAB — CULTURE, BLOOD (ROUTINE X 2): Special Requests: ADEQUATE

## 2023-05-25 LAB — PATHOLOGIST SMEAR REVIEW

## 2023-05-25 MED ORDER — POLYETHYLENE GLYCOL 3350 17 G PO PACK
17.0000 g | PACK | Freq: Every day | ORAL | Status: DC
  Administered 2023-05-25 – 2023-05-29 (×5): 17 g via ORAL
  Filled 2023-05-25 (×6): qty 1

## 2023-05-25 MED ORDER — IMMUNE GLOBULIN (HUMAN) 10 GM/100ML IV SOLN
1.0000 g/kg | INTRAVENOUS | Status: AC
  Administered 2023-05-25 – 2023-05-26 (×2): 80 g via INTRAVENOUS
  Filled 2023-05-25 (×2): qty 800

## 2023-05-25 MED ORDER — DOCUSATE SODIUM 100 MG PO CAPS
100.0000 mg | ORAL_CAPSULE | Freq: Two times a day (BID) | ORAL | Status: DC
  Administered 2023-05-25 – 2023-05-29 (×8): 100 mg via ORAL
  Filled 2023-05-25 (×8): qty 1

## 2023-05-25 MED ORDER — LEVOFLOXACIN 500 MG PO TABS
500.0000 mg | ORAL_TABLET | Freq: Every day | ORAL | Status: AC
  Administered 2023-05-25 – 2023-05-27 (×3): 500 mg via ORAL
  Filled 2023-05-25 (×3): qty 1

## 2023-05-25 MED ORDER — METHOCARBAMOL 1000 MG/10ML IJ SOLN
500.0000 mg | Freq: Three times a day (TID) | INTRAMUSCULAR | Status: DC | PRN
  Administered 2023-05-27: 500 mg via INTRAVENOUS
  Filled 2023-05-25: qty 10

## 2023-05-25 MED ORDER — IOHEXOL 350 MG/ML SOLN
75.0000 mL | Freq: Once | INTRAVENOUS | Status: AC | PRN
Start: 2023-05-25 — End: 2023-05-25
  Administered 2023-05-25: 75 mL via INTRAVENOUS

## 2023-05-25 NOTE — Plan of Care (Signed)

## 2023-05-25 NOTE — Progress Notes (Signed)
 Of the floor for CT

## 2023-05-25 NOTE — Progress Notes (Addendum)
 [facility]  Date of Admission:  05/22/2023   Total days of inpatient antibiotics 2  Principal Problem:   Acute UTI Active Problems:   Hypothyroidism   Essential hypertension   Other pancytopenia (HCC)   Spinal stenosis of lumbar region with neurogenic claudication   AKI (acute kidney injury) (HCC)   Macular degeneration          Assessment: 89 year old male with pancytopenia followed by oncology status post bone marrow biopsy with findings consistent with drug-induced versus autoimmune induced abnormality on prednisone referred to rheumatology, gout, lumbar spinal stenosis presented with low back pain radiating to lower extremities found to have: #MSSA bacteremia secondary to unclear source. - Patient presented with fever no leukocytosis.  On record review it was noted that he had positive urine cultures with MSSA on 2/27 for which she received Macrobid x 10 days. - CT renal study showed bilateral nonobstructing intrarenal calculi, possible diverticulitis, small left pleural effusion, small bilateral inguinal hernias. - Ultrasound renal showed echogenic material in the urinary bladder which could be's sequela of recent infection. - Blood cultures from admission grew MSSA.  Urine cultures growing 100 K colonies Staph aureus, GNR 40K - I spoke to patient and wife.  Wife states that he bruises easily.  No recent episodes of rash, open cuts or wounds.  No recent dental procedures, -MRI thoracic and lumbar spine no infection Recommendations:  -Continue cefazolin - Repeat blood cultures  - TEE no beg. TEE ordered not a candidate as Plt 21(cut off 50k per cards). Onc consulted for thrombocytopenia noted continue steroids, consider IVIG. - CT max facial as pt unable to stand for orthopantogram    #MSSA and PsA in urine -Pseudomanias in urine is likely contaminant but given echogenic material in bladder and MSSA represtative of overflow will start PO  levaquin   Evaluation of this patient requires complex antimicrobial therapy evaluation and counseling + isolation needs for disease transmission risk assessment and mitigation   Microbiology:   Antibiotics: Ctx 3/10 Cefazolin 3/11- Cultures: Blood 3/10 MSSA Urine 3/10 MSSA and gram-negative rods 2/27 MSSA   SUBJECTIVE: Sittin in chair, reports back pain Interval: Afebrile overnight: 100-k MSSA, 40 PsA  Review of Systems: Review of Systems  All other systems reviewed and are negative.    Scheduled Meds:  levothyroxine  150 mcg Oral QAC breakfast   lidocaine  1-3 patch Transdermal Q24H   predniSONE  20 mg Oral Q breakfast   Continuous Infusions:  sodium chloride 75 mL/hr at 05/23/23 1452    ceFAZolin (ANCEF) IV 2 g (05/25/23 0550)   Immune Globulin 10%     PRN Meds:.acetaminophen **OR** acetaminophen, methocarbamol (ROBAXIN) injection, naphazoline-glycerin, oxyCODONE, polyethylene glycol Allergies  Allergen Reactions   Nsaids Other (See Comments)    JAUNDICE   Celecoxib Nausea And Vomiting   Meperidine Hcl Nausea Only    OBJECTIVE: Vitals:   05/25/23 1319 05/25/23 1335 05/25/23 1351 05/25/23 1438  BP: (!) 121/56 (!) 124/57 (!) 135/55 (!) 125/58  Pulse: 67 67 70 65  Resp: 16 16 16 16   Temp: 98.8 F (37.1 C) 98.3 F (36.8 C) 98.8 F (37.1 C) 98.7 F (37.1 C)  TempSrc: Oral Oral Oral Oral  SpO2: 95% 96% 97% 98%  Weight:      Height:       Body mass index is 25.16 kg/m.  Physical Exam Constitutional:      General: He is not in acute distress.  Appearance: He is normal weight. He is not toxic-appearing.  HENT:     Head: Normocephalic and atraumatic.     Right Ear: External ear normal.     Left Ear: External ear normal.     Nose: No congestion or rhinorrhea.     Mouth/Throat:     Mouth: Mucous membranes are moist.     Pharynx: Oropharynx is clear.  Eyes:     Extraocular Movements: Extraocular movements intact.     Conjunctiva/sclera:  Conjunctivae normal.     Pupils: Pupils are equal, round, and reactive to light.  Cardiovascular:     Rate and Rhythm: Normal rate and regular rhythm.     Heart sounds: No murmur heard.    No friction rub. No gallop.  Pulmonary:     Effort: Pulmonary effort is normal.     Breath sounds: Normal breath sounds.  Abdominal:     General: Abdomen is flat. Bowel sounds are normal.     Palpations: Abdomen is soft.  Musculoskeletal:        General: No swelling. Normal range of motion.     Cervical back: Normal range of motion and neck supple.  Skin:    General: Skin is warm and dry.  Neurological:     General: No focal deficit present.     Mental Status: He is oriented to person, place, and time.  Psychiatric:        Mood and Affect: Mood normal.       Lab Results Lab Results  Component Value Date   WBC 5.6 05/25/2023   HGB 8.5 (L) 05/25/2023   HCT 25.5 (L) 05/25/2023   MCV 101.6 (H) 05/25/2023   PLT 21 (LL) 05/25/2023    Lab Results  Component Value Date   CREATININE 1.03 05/25/2023   BUN 37 (H) 05/25/2023   NA 138 05/25/2023   K 3.9 05/25/2023   CL 109 05/25/2023   CO2 22 05/25/2023    Lab Results  Component Value Date   ALT 36 05/11/2023   AST 29 05/11/2023   ALKPHOS 179 (H) 05/11/2023   BILITOT 1.2 05/11/2023        Danelle Earthly, MD [facility] 05/25/2023, 2:56 PM

## 2023-05-25 NOTE — Progress Notes (Signed)
 Physical Therapy Treatment Patient Details Name: Samuel Little MRN: [id number] DOB: 22-Mar-1932 Today's Date: 05/25/2023   History of Present Illness 89 yo lower back/ flank pain. Recent UTI with treatment.  UTI with hematuria CT (+) renal stone study showing bilateral nonobstructive intrarenal calculi without hydronephrosis. PMH: back pain, hematuria, HTN, pancytopenia, gout, hypthyroidism, HOH with hearing aides, macular degeneration, RBBB, wear glasses R TKA, R rotator cuff repair,    PT Comments  Pt resting in bed on arrival and agreeable to session with continues progress towards acute goals. Pt continues to be limited by decreased cognition, hearing deficits, weakness, impaired balance/postural reactions and decreased activity tolerance. Pt requiring mod A to complete bed mobility and mod A  +2 for transfers. Pt able to progress gait for short in room distance this session with min A +2 with chair follow for safety as pt with crouched posture and knee flexion throughout. Pt up in recliner at end of session. Patient will benefit from continued inpatient follow up therapy, <3 hours/day. Pt continues to benefit from skilled PT services to progress toward functional mobility goals.     If plan is discharge home, recommend the following: Two people to help with walking and/or transfers;Two people to help with bathing/dressing/bathroom;Assistance with cooking/housework;Help with stairs or ramp for entrance;Assist for transportation   Can travel by private vehicle     No  Equipment Recommendations  None recommended by PT    Recommendations for Other Services       Precautions / Restrictions Precautions Precautions: Fall Recall of Precautions/Restrictions: Intact Restrictions Weight Bearing Restrictions Per Provider Order: No     Mobility  Bed Mobility Overal bed mobility: Needs Assistance Bed Mobility: Supine to Sit     Supine to sit: Mod assist, HOB elevated, Used rails      General bed mobility comments: cues for technque with assistance with BLEs and trunk    Transfers Overall transfer level: Needs assistance Equipment used: Rolling walker (2 wheels) Transfers: Sit to/from Stand, Bed to chair/wheelchair/BSC Sit to Stand: Mod assist, +2 physical assistance   Step pivot transfers: Mod assist, +2 physical assistance       General transfer comment: cues for hand placement and heavy mod assist +2 to stand from EOB.    Ambulation/Gait Ambulation/Gait assistance: Min assist, +2 physical assistance, +2 safety/equipment (with chair follow) Gait Distance (Feet): 8 Feet Assistive device: Rolling walker (2 wheels) Gait Pattern/deviations: Step-to pattern, Decreased stride length, Knee flexed in stance - right, Knee flexed in stance - left, Shuffle, Trunk flexed, Narrow base of support Gait velocity: very slowed     General Gait Details: crouched posture with heavy reliance on UE support on RW, distance limited by back pain   Stairs             Wheelchair Mobility     Tilt Bed    Modified [name] (Stroke Patients Only)       Balance Overall balance assessment: Needs assistance Sitting-balance support: Feet supported, Bilateral upper extremity supported Sitting balance-Leahy Scale: Fair     Standing balance support: During functional activity, Reliant on assistive device for balance, Bilateral upper extremity supported Standing balance-Leahy Scale: Poor Standing balance comment: patient reliant on external support for balance                            Communication Communication Communication: Impaired Factors Affecting Communication: Hearing impaired (wears hearing aides, reads lips)  Cognition Arousal: Alert Behavior  During Therapy: WFL for tasks assessed/performed                             Following commands: Intact      Cueing Cueing Techniques: Tactile cues, Visual cues (reads lips)  Exercises  General Exercises - Lower Extremity Long Arc Quad: AROM, Left, Right, 10 reps, Seated    General Comments General comments (skin integrity, edema, etc.): VSS on RA      Pertinent Vitals/Pain Pain Assessment Pain Assessment: Faces Faces Pain Scale: Hurts even more Pain Location: low back and neck Pain Descriptors / Indicators: Grimacing, Guarding, Discomfort, Aching    Home Living                          Prior Function            PT Goals (current goals can now be found in the care plan section) Acute Rehab PT Goals PT Goal Formulation: With patient Time For Goal Achievement: 06/06/23 Progress towards PT goals: Progressing toward goals    Frequency    Min 1X/week      PT Plan      Co-evaluation PT/OT/SLP Co-Evaluation/Treatment: Yes Reason for Co-Treatment: For patient/therapist safety;To address functional/ADL transfers PT goals addressed during session: Balance;Proper use of DME;Mobility/safety with mobility OT goals addressed during session: ADL's and self-care      AM-PAC PT "6 Clicks" Mobility   Outcome Measure  Help needed turning from your back to your side while in a flat bed without using bedrails?: A Lot Help needed moving from lying on your back to sitting on the side of a flat bed without using bedrails?: A Lot Help needed moving to and from a bed to a chair (including a wheelchair)?: Total Help needed standing up from a chair using your arms (e.g., wheelchair or bedside chair)?: Total Help needed to walk in hospital room?: Total Help needed climbing 3-5 steps with a railing? : Total 6 Click Score: 8    End of Session Equipment Utilized During Treatment: Gait belt Activity Tolerance: Patient tolerated treatment well;Patient limited by pain Patient left: in chair;with call bell/phone within reach;with chair alarm set Nurse Communication: Mobility status PT Visit Diagnosis: Muscle weakness (generalized) (M62.81);Difficulty in walking,  not elsewhere classified (R26.2);Pain;Unsteadiness on feet (R26.81) Pain - Right/Left: Left     Time: 2956-2130 PT Time Calculation (min) (ACUTE ONLY): 27 min  Charges:    $Gait Training: 8-22 mins PT General Charges $$ ACUTE PT VISIT: 1 Visit                     Melba Araki R. PTA Acute Rehabilitation Services Office: [phone]   Catalina Antigua 05/25/2023, 1:24 PM

## 2023-05-25 NOTE — TOC Progression Note (Addendum)
 Transition of Care [facility]) - Progression Note    Patient Details  Name: VITALI SEIBERT MRN: [id number] Date of Birth: [birth year]  [facility] CM/SW Contact  Marliss Coots, LCSW Phone Number: 05/25/2023, 9:23 AM  Clinical Narrative:     9:23 AM Patient's spouse, Gardiner Ramus, returned CSW call. CSW informed Gardiner Ramus that per [employer], patient does not have SNF copays the first 100 days at [facility]. Gardiner Ramus expressed understanding of the following information.  12:54 PM SNF insurance authorization request was submitted. Request is currently pending (ID [id number]).  12:59 PM Insurance authorization was approved and is valid 05/26/2023-05/30/2023.   Expected Discharge Plan: Skilled Nursing Facility Barriers to Discharge: SNF Pending bed offer, Continued Medical Work up  Expected Discharge Plan and Services In-house Referral: Clinical Social Work   Post Acute Care Choice: Skilled Nursing Facility Living arrangements for the past 2 months: Single Family Home                                       Social Determinants of Health (SDOH) Interventions SDOH Screenings   Food Insecurity: No Food Insecurity (05/22/2023)  Housing: Low Risk  (05/22/2023)  Transportation Needs: No Transportation Needs (05/22/2023)  Utilities: Not At Risk (05/22/2023)  Social Connections: Moderately Integrated (05/23/2023)  Tobacco Use: Low Risk  (05/22/2023)    Readmission Risk Interventions     No data to display

## 2023-05-25 NOTE — Progress Notes (Addendum)
 Progress Note   Patient: Samuel Little WUJ:[id number] DOB: 1932/05/16 DOA: 05/22/2023     2 DOS: the patient was seen and examined on 05/25/2023   Brief hospital course: 89yo with h/o HTN, AVNRT s/p ablation, hypothyroidism, pancytopenia, and lumbar stenosis with neurogenic claudication who presented on 3/10 with LBP and hematuria.  He was recently treated for MSSA UTI with 10 days of Macrobid.  CT with B nonobstructing intrarenal calculi without hydronephrosis.  He was found to have MSSA UTI with bacteremia and was started on Ceftriaxone.  Orthopantogram ordered.    Assessment and Plan:  Acute MSSA UTI with hematuria and bacteremia Recent MSSA UTI, completed 10-day course of Macrobid  Presented with hematuria and bilateral flank pain  Urinalysis with bacteria, leukocytes and blood  CT renal study with nonobstructing intrarenal calculi, no hydronephrosis, no suspicious bladder lesions Blood culture is growing MSSA Ceftriaxone -> Cefazolin ID consulting Repeat blood cultures are pending  AKI Baseline creatinine 0.8 Presented with creatinine 1.3 in the setting of decreased oral intake  Renal ultrasound showed bilateral nonobstructing renal calculi, echogenic material within the urinary bladder which may have been sequelae associated with recent infection Renal function continued to gradually worsen despite IVF but has now returned to baseline   Hx of pancytopenia Continues to worsen, particularly thrombocytopenia Followed by ID 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 Dr. Shirline Frees Previously with concern that gout medicine (allopurinol) was contributing, but it was stopped about a month ago without improvement Continue prednisone 20 mg daily SCDs for DVT ppx Consulted oncology given worsening despite stopping allopurinol and starting prednisone Transfuse for platelets <20, <50 with bleeding Dr. Arbutus Ped is giving IVIG  today Hopefully this will stabilize platelets and also improve back pain   Lumbar spinal stenosis with neurogenic claudication MRI T/L-spine with unchanged severe R L1-2, L 3-4, and B L5-S1 stenosis heating pad, lidocaine patch Tylenol every 6 hours as needed, oxycodone 5 mg every 4 hours as needed for pain control Will add Robaxin PT/OT eval -> recommending SNF rehab Patient reports little improvement after treatment of UTI Will reach out to Dr. Lovell Sheehan although he is unlikely to be a current candidate for intervention Will consult palliative care for assistance with pain control and also GOC discussion   Hypertension BP 112/56-158/68, currently 120/57 Holding doxazosin, lisinopril  Macular degeneration Reports that he is no longer able to read On Eylea injections   Hypothyroidism Continue home Synthroid Normal TSH   Goals of care He reports having a living will [facility] documents indicate a desire for full scope of care with resuscitation However, patient reports that he would prefer to "die naturally" Discussed with wife, changed to DNR He has been accepted to [facility] once he is better, with improvement in platelets and back pain       Consultants: ID Oncology PT OT TOC team   Procedures: None   Antibiotics: Ceftriaxone x 2 doses Cefazolin 3/11-    30 Day Unplanned Readmission Risk Score    Flowsheet Row ED to Hosp-Admission (Current) from 05/22/2023 in [facility] GENERAL MED/SURG UNIT  30 Day Unplanned Readmission Risk Score (%) 14.05 Filed at 05/25/2023 0400       This score is the patient's risk of an unplanned readmission within 30 days of being discharged (0 -100%). The score is based on dignosis, age, lab data, medications, orders, and past utilization.   Low:  0-14.9   Medium: 15-21.9  High: 22-29.9   Extreme: 30 and above           Subjective: Continues to have significant LS back pain, not improved with therapy to date.  Dr.  Arbutus Ped is giving IVIG infusion today.   Objective: Vitals:   05/25/23 1351 05/25/23 1438  BP: (!) 135/55 (!) 125/58  Pulse: 70 65  Resp: 16 16  Temp: 98.8 F (37.1 C) 98.7 F (37.1 C)  SpO2: 97% 98%    Intake/Output Summary (Last 24 hours) at 05/25/2023 1456 Last data filed at 05/25/2023 1000 Gross per 24 hour  Intake --  Output 1070 ml  Net -1070 ml   Filed Weights   05/22/23 2350  Weight: 86.5 kg    Exam:  General:  Appears calm and comfortable and is in NAD, resting in bedside chair Eyes:  EOMI, normal lids, iris ENT:   very hard of hearing, grossly normal lips & tongue, mmm Neck:  no LAD, masses or thyromegaly Cardiovascular:  RRR, no m/r/g. No LE edema.  Respiratory:   CTA bilaterally with no wheezes/rales/rhonchi.  Normal respiratory effort. Abdomen:  soft, NT, ND Skin:  no rash or induration seen on limited exam Musculoskeletal:  grossly normal tone BUE/BLE, good ROM, no bony abnormality Psychiatric:  blunted mood and affect, speech fluent and appropriate, AOx3 Neurologic:  CN 2-12 grossly intact, moves all extremities in coordinated fashion  Data Reviewed: I have reviewed the patient's lab results since admission.  Pertinent labs for today include:   BUN 37/Creatinine 1.03/GFR >60 - normalized WBC 5.6 Hgb 8.5, stable Platelets 21, stable     Family Communication: None present; I spoke with his daughter by telephone  Disposition: Status is: Inpatient Remains inpatient appropriate because: ongoing management     Time spent: 50 minutes  Unresulted Labs (From admission, onward)     Start     Ordered   05/26/23 0500  CBC with Differential/Platelet  Tomorrow morning,   R        05/25/23 1447   05/26/23 0500  Basic metabolic panel  Tomorrow morning,   R        05/25/23 1447   05/25/23 0923  Culture, blood (Routine X 2) w Reflex to ID Panel  BLOOD CULTURE X 2,   R      05/25/23 8295   05/25/23 0530  Pathologist smear review  Once,   R         05/25/23 0530             Author: Jonah Blue, MD 05/25/2023 2:56 PM  For on call review [email or web address].

## 2023-05-25 NOTE — Progress Notes (Signed)
 Occupational Therapy Treatment Patient Details Name: Samuel Little MRN: [id number] DOB: 08/09/1932 Today's Date: 05/25/2023   History of present illness 89 yo lower back/ flank pain. Recent UTI with treatment.  UTI with hematuria CT (+) renal stone study showing bilateral nonobstructive intrarenal calculi without hydronephrosis. PMH: back pain, hematuria, HTN, pancytopenia, gout, hypthyroidism, HOH with hearing aides, macular degeneration, RBBB, wear glasses R TKA, R rotator cuff repair,   OT comments  Patient received in supine and agreeable to OT/PT session. Patient attempting to bridge to pull up underwear while supine but unable to pull all the way up. Patient demonstrated gains with bed mobility with mod assist to get to EOB. Patient able to stand from EOB with mod assist +2 and step pivot transfer to [facility]. Patient able to stand for toilet hygiene and clothing management with max assist. Patient able to take steps from [facility] before sitting in recliner. Patient will benefit from continued inpatient follow up therapy, <3 hours/day. Acute OT to continue to follow to address established goals to facilitate DC to next venue of care.       If plan is discharge home, recommend the following:  Two people to help with walking and/or transfers;Two people to help with bathing/dressing/bathroom   Equipment Recommendations  Wheelchair (measurements OT);Wheelchair cushion (measurements OT);Hospital bed;Hoyer lift    Recommendations for Other Services      Precautions / Restrictions Precautions Precautions: Fall Recall of Precautions/Restrictions: Intact Restrictions Weight Bearing Restrictions Per Provider Order: No       Mobility Bed Mobility Overal bed mobility: Needs Assistance Bed Mobility: Supine to Sit     Supine to sit: Mod assist, HOB elevated, Used rails     General bed mobility comments: cues for technque with assistance with BLEs and trunk    Transfers Overall transfer level:  Needs assistance Equipment used: Rolling walker (2 wheels) Transfers: Sit to/from Stand, Bed to chair/wheelchair/BSC Sit to Stand: Mod assist, +2 physical assistance     Step pivot transfers: Mod assist, +2 physical assistance     General transfer comment: cues for hand placement and heavy mod assist +2 to stand from EOB.     Balance Overall balance assessment: Needs assistance Sitting-balance support: Feet supported, Bilateral upper extremity supported Sitting balance-Leahy Scale: Fair     Standing balance support: During functional activity, Reliant on assistive device for balance, Bilateral upper extremity supported Standing balance-Leahy Scale: Poor Standing balance comment: patient reliant on external support for balance                           ADL either performed or assessed with clinical judgement   ADL Overall ADL's : Needs assistance/impaired     Grooming: Wash/dry hands;Wash/dry face;Oral care;Set up;Sitting Grooming Details (indicate cue type and reason): in recliner             Lower Body Dressing: Maximal assistance;Sit to/from stand Lower Body Dressing Details (indicate cue type and reason): donning underwear Toilet Transfer: Moderate assistance;+2 for physical assistance;BSC/3in1;Rolling walker (2 wheels) Toilet Transfer Details (indicate cue type and reason): +2 assist with transfer from EOB to [facility] with flexed posture Toileting- Clothing Manipulation and Hygiene: Maximal assistance;Sit to/from stand Toileting - Clothing Manipulation Details (indicate cue type and reason): unable to assist due to reliant on BUE support when standing       General ADL Comments: patient asking to use stedy for foot stool while on [facility]    Extremity/Trunk Assessment  Vision       Perception     Praxis     Communication Communication Communication: Impaired Factors Affecting Communication: Hearing impaired (wears hearing aides, reads  lips)   Cognition Arousal: Alert Behavior During Therapy: WFL for tasks assessed/performed Cognition: No apparent impairments, No family/caregiver present to determine baseline                               Following commands: Intact        Cueing   Cueing Techniques: Tactile cues, Visual cues (reads lips)  Exercises      Shoulder Instructions       General Comments      Pertinent Vitals/ Pain       Pain Assessment Pain Assessment: Faces Faces Pain Scale: Hurts even more Pain Location: low back and neck Pain Descriptors / Indicators: Grimacing, Guarding, Discomfort, Aching Pain Intervention(s): Limited activity within patient's tolerance, Monitored during session, Repositioned  Home Living                                          Prior Functioning/Environment              Frequency  Min 2X/week        Progress Toward Goals  OT Goals(current goals can now be found in the care plan section)  Progress towards OT goals: Progressing toward goals  Acute Rehab OT Goals Patient Stated Goal: less pain OT Goal Formulation: With patient Time For Goal Achievement: 06/06/23 Potential to Achieve Goals: Good ADL Goals Pt Will Perform Grooming: with set-up;sitting Pt Will Perform Upper Body Bathing: with supervision;sitting Pt Will Transfer to Toilet: with mod assist;bedside commode;stand pivot transfer Additional ADL Goal #1: pt will complete sit<>Stand min (A) for surface matching home surface heights Additional ADL Goal #2: pt will complete basic transfer with RW min (A)  Plan      Co-evaluation    PT/OT/SLP Co-Evaluation/Treatment: Yes Reason for Co-Treatment: For patient/therapist safety;To address functional/ADL transfers   OT goals addressed during session: ADL's and self-care      AM-PAC OT "6 Clicks" Daily Activity     Outcome Measure   Help from another person eating meals?: None Help from another person taking  care of personal grooming?: A Little Help from another person toileting, which includes using toliet, bedpan, or urinal?: A Lot Help from another person bathing (including washing, rinsing, drying)?: A Lot Help from another person to put on and taking off regular upper body clothing?: A Lot Help from another person to put on and taking off regular lower body clothing?: A Lot 6 Click Score: 15    End of Session Equipment Utilized During Treatment: Gait belt;Rolling walker (2 wheels)  OT Visit Diagnosis: Unsteadiness on feet (R26.81);Muscle weakness (generalized) (M62.81);Pain Pain - part of body:  (back)   Activity Tolerance Patient limited by pain   Patient Left in chair;with call bell/phone within reach;with chair alarm set   Nurse Communication Precautions;Mobility status;Need for lift equipment        Time: [id number] OT Time Calculation (min): 27 min  Charges: OT General Charges $OT Visit: 1 Visit OT Treatments $Self Care/Home Management : 8-22 mins  Alfonse Flavors, OTA Acute Rehabilitation Services  Office [phone]   Dewain Penning 05/25/2023, 11:56 AM

## 2023-05-26 DIAGNOSIS — N390 Urinary tract infection, site not specified: Secondary | ICD-10-CM | POA: Diagnosis not present

## 2023-05-26 LAB — BASIC METABOLIC PANEL
Anion gap: 5 (ref 5–15)
BUN: 39 mg/dL — ABNORMAL HIGH (ref 8–23)
CO2: 22 mmol/L (ref 22–32)
Calcium: 8.1 mg/dL — ABNORMAL LOW (ref 8.9–10.3)
Chloride: 106 mmol/L (ref 98–111)
Creatinine, Ser: 1.12 mg/dL (ref 0.61–1.24)
GFR, Estimated: >60 mL/min (ref >60)
Glucose, Bld: 95 mg/dL (ref 70–99)
Potassium: 3.9 mmol/L (ref 3.5–5.1)
Sodium: 133 mmol/L — ABNORMAL LOW (ref 135–145)

## 2023-05-26 LAB — CBC WITH DIFFERENTIAL/PLATELET
Abs Immature Granulocytes: 0.1 10*3/uL — ABNORMAL HIGH (ref 0.00–0.07)
Basophils Absolute: 0 10*3/uL (ref 0.0–0.1)
Basophils Relative: 0 %
Eosinophils Absolute: 0 10*3/uL (ref 0.0–0.5)
Eosinophils Relative: 0 %
HCT: 22.6 % — ABNORMAL LOW (ref 39.0–52.0)
Hemoglobin: 7.6 g/dL — ABNORMAL LOW (ref 13.0–17.0)
Immature Granulocytes: 2 %
Lymphocytes Relative: 13 %
Lymphs Abs: 0.7 10*3/uL (ref 0.7–4.0)
MCH: 33.6 pg (ref 26.0–34.0)
MCHC: 33.6 g/dL (ref 30.0–36.0)
MCV: 100 fL (ref 80.0–100.0)
Monocytes Absolute: 0.5 10*3/uL (ref 0.1–1.0)
Monocytes Relative: 10 %
Neutro Abs: 4.2 10*3/uL (ref 1.7–7.7)
Neutrophils Relative %: 75 %
Platelets: 18 10*3/uL — CL (ref 150–400)
RBC: 2.26 MIL/uL — ABNORMAL LOW (ref 4.22–5.81)
RDW: 21.2 % — ABNORMAL HIGH (ref 11.5–15.5)
WBC: 5.5 10*3/uL (ref 4.0–10.5)
nRBC: 0.5 % — ABNORMAL HIGH (ref 0.0–0.2)

## 2023-05-26 LAB — URINE CULTURE

## 2023-05-26 LAB — TYPE AND SCREEN
ABO/RH(D): O POS
Antibody Screen: NEGATIVE

## 2023-05-26 MED ORDER — SODIUM CHLORIDE 0.9% IV SOLUTION: Freq: Once | INTRAVENOUS | Status: AC

## 2023-05-26 NOTE — TOC Progression Note (Signed)
 Transition of Care [facility]) - Progression Note    Patient Details  Name: Samuel Little MRN: [id number] Date of Birth: 10/22/1932  [facility] CM/SW Contact  Marliss Coots, LCSW Phone Number: 05/26/2023, 9:53 AM  Clinical Narrative:     9:53 AM Per hospitalist, patient is expected to discharge to [facility] Monday, March 17th. CSW relayed expected discharge date to SNF.  Expected Discharge Plan: Skilled Nursing Facility Barriers to Discharge: SNF Pending bed offer, Continued Medical Work up  Expected Discharge Plan and Services In-house Referral: Clinical Social Work   Post Acute Care Choice: Skilled Nursing Facility Living arrangements for the past 2 months: Single Family Home                                       Social Determinants of Health (SDOH) Interventions SDOH Screenings   Food Insecurity: No Food Insecurity (05/22/2023)  Housing: Low Risk  (05/22/2023)  Transportation Needs: No Transportation Needs (05/22/2023)  Utilities: Not At Risk (05/22/2023)  Social Connections: Moderately Integrated (05/23/2023)  Tobacco Use: Low Risk  (05/22/2023)    Readmission Risk Interventions     No data to display

## 2023-05-26 NOTE — Progress Notes (Signed)
 Progress Note   Patient: Samuel Little ZOX:[id number] DOB: [birth year] DOA: 05/22/2023     3 DOS: the patient was seen and examined on 05/26/2023   Brief hospital course: 89yo with h/o HTN, AVNRT s/p ablation, hypothyroidism, pancytopenia, and lumbar stenosis with neurogenic claudication who presented on 3/10 with LBP and hematuria.  He was recently treated for MSSA UTI with 10 days of Macrobid.  CT with B nonobstructing intrarenal calculi without hydronephrosis.  He was found to have MSSA UTI with bacteremia and was started on Ceftriaxone.  Orthopantogram ordered.    Assessment and Plan:  Acute MSSA UTI with hematuria and bacteremia Recent MSSA UTI, completed 10-day course of Macrobid  Presented with hematuria and bilateral flank pain  Urinalysis with bacteria, leukocytes and blood  CT renal study with nonobstructing intrarenal calculi, no hydronephrosis, no suspicious bladder lesions Blood culture is growing MSSA Ceftriaxone -> Cefazolin ID consulting Repeat blood cultures are NTD x 24 hours   AKI, resolved Baseline creatinine 0.8 Presented with creatinine 1.3 in the setting of decreased oral intake  Renal ultrasound showed bilateral nonobstructing renal calculi, echogenic material within the urinary bladder which may have been sequelae associated with recent infection Renal function continued to gradually worsen despite IVF but has now returned to baseline Continue to avoid nephrotoxic agents when possible   Hx of pancytopenia Continues to worsen, particularly thrombocytopenia Followed by ID 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 Dr. Shirline Frees Previously with concern that gout medicine (allopurinol) was contributing, but it was stopped about a month ago without improvement Continue prednisone 20 mg daily SCDs for DVT ppx Consulted oncology given worsening despite stopping allopurinol and starting prednisone Transfuse for  platelets <20, <50 with bleeding Dr. Arbutus Ped gave IVIG on 3/13 Platelets 18 on 3/14, transfused 2 units platelets   Lumbar spinal stenosis with neurogenic claudication MRI T/L-spine with unchanged severe R L1-2, L 3-4, and B L5-S1 stenosis heating pad, lidocaine patch Tylenol every 6 hours as needed, oxycodone 5 mg every 4 hours as needed for pain control Will add Robaxin PT/OT eval -> recommending SNF rehab Patient reports little improvement after treatment of UTI He appears to be an unlikely surgical candidate at this time Will consult palliative care for assistance with pain control and also GOC discussion   Hypertension BP 112/56-158/68, currently 120/57 Holding doxazosin, lisinopril   Macular degeneration Reports that he is no longer able to read On Eylea injections   Hypothyroidism Continue home Synthroid Normal TSH   Goals of care He reports having a living will [facility] documents indicate a desire for full scope of care with resuscitation However, patient reports that he would prefer to "die naturally" Discussed with wife, changed to DNR He has been accepted to [facility] once he is better, with improvement in platelets and back pain - hopefully before 3/18 when his authorization expires       Consultants: ID Oncology PT OT TOC team   Procedures: None   Antibiotics: Ceftriaxone x 2 doses Cefazolin 3/11-   30 Day Unplanned Readmission Risk Score    Flowsheet Row ED to Hosp-Admission (Current) from 05/22/2023 in [facility] GENERAL MED/SURG UNIT  30 Day Unplanned Readmission Risk Score (%) 15.63 Filed at 05/26/2023 0801       This score is the patient's risk of an unplanned readmission within 30 days of being discharged (0 -100%). The score is based on dignosis, age, lab data, medications, orders, and past  utilization.   Low:  0-14.9   Medium: 15-21.9   High: 22-29.9   Extreme: 30 and above           Subjective: Continues to report  unchanged back pain   Objective: Vitals:   05/26/23 0734 05/26/23 1233  BP: (!) 150/64 (!) 127/58  Pulse: 72 69  Resp: 16 16  Temp: 98.4 F (36.9 C) 97.6 F (36.4 C)  SpO2: 99% 96%    Intake/Output Summary (Last 24 hours) at 05/26/2023 1407 Last data filed at 05/26/2023 0600 Gross per 24 hour  Intake 1000 ml  Output 650 ml  Net 350 ml   Filed Weights   05/22/23 2350  Weight: 86.5 kg    Exam:  General:  Appears calm and comfortable and is in NAD, resting in bedside chair Eyes:  EOMI, normal lids, iris ENT:   very hard of hearing, grossly normal lips & tongue, mmm Neck:  no LAD, masses or thyromegaly Cardiovascular:  RRR, no m/r/g. No LE edema.  Respiratory:   CTA bilaterally with no wheezes/rales/rhonchi.  Normal respiratory effort. Abdomen:  soft, NT, ND Skin:  no rash or induration seen on limited exam Musculoskeletal:  grossly normal tone BUE/BLE, good ROM, no bony abnormality Psychiatric:  blunted mood and affect, speech fluent and appropriate, AOx3 Neurologic:  CN 2-12 grossly intact, moves all extremities in coordinated fashion  Data Reviewed: I have reviewed the patient's lab results since admission.  Pertinent labs for today include:   Na++ 133 WBC 5.5 Hgb 7.6 Platelets 18     Family Communication: None present; I spoke with his daughter by telephone  Disposition: Status is: Inpatient Remains inpatient appropriate because: ongoing management     Time spent: 50 minutes  Unresulted Labs (From admission, onward)     Start     Ordered   05/27/23 0500  CBC with Differential/Platelet  Daily,   R      05/26/23 1407   05/27/23 0500  Basic metabolic panel  Daily,   R      05/26/23 1407             Author: Jonah Blue, MD 05/26/2023 2:07 PM  For on call review [email or web address].

## 2023-05-26 NOTE — Plan of Care (Signed)
  Problem: Education: Goal: Knowledge of General Education information will improve Description: Including pain rating scale, medication(s)/side effects and non-pharmacologic comfort measures Outcome: Progressing   Problem: Health Behavior/Discharge Planning: Goal: Ability to manage health-related needs will improve Outcome: Progressing   Problem: Pain Managment: Goal: General experience of comfort will improve and/or be controlled Outcome: Progressing   Problem: Safety: Goal: Ability to remain free from injury will improve Outcome: Progressing

## 2023-05-27 DIAGNOSIS — N390 Urinary tract infection, site not specified: Secondary | ICD-10-CM | POA: Diagnosis not present

## 2023-05-27 LAB — CBC WITH DIFFERENTIAL/PLATELET
Abs Immature Granulocytes: 0.1 10*3/uL — ABNORMAL HIGH (ref 0.00–0.07)
Basophils Absolute: 0 10*3/uL (ref 0.0–0.1)
Basophils Relative: 0 %
Eosinophils Absolute: 0 10*3/uL (ref 0.0–0.5)
Eosinophils Relative: 0 %
HCT: 23.6 % — ABNORMAL LOW (ref 39.0–52.0)
Hemoglobin: 7.8 g/dL — ABNORMAL LOW (ref 13.0–17.0)
Immature Granulocytes: 2 %
Lymphocytes Relative: 17 %
Lymphs Abs: 1 10*3/uL (ref 0.7–4.0)
MCH: 33.3 pg (ref 26.0–34.0)
MCHC: 33.1 g/dL (ref 30.0–36.0)
MCV: 100.9 fL — ABNORMAL HIGH (ref 80.0–100.0)
Monocytes Absolute: 0.8 10*3/uL (ref 0.1–1.0)
Monocytes Relative: 14 %
Neutro Abs: 3.9 10*3/uL (ref 1.7–7.7)
Neutrophils Relative %: 67 %
Platelets: 25 10*3/uL — CL (ref 150–400)
RBC: 2.34 MIL/uL — ABNORMAL LOW (ref 4.22–5.81)
RDW: 21.1 % — ABNORMAL HIGH (ref 11.5–15.5)
Smear Review: DECREASED
WBC: 5.8 10*3/uL (ref 4.0–10.5)
nRBC: 0.3 % — ABNORMAL HIGH (ref 0.0–0.2)

## 2023-05-27 LAB — BASIC METABOLIC PANEL
Anion gap: 4 — ABNORMAL LOW (ref 5–15)
BUN: 35 mg/dL — ABNORMAL HIGH (ref 8–23)
CO2: 23 mmol/L (ref 22–32)
Calcium: 8.2 mg/dL — ABNORMAL LOW (ref 8.9–10.3)
Chloride: 107 mmol/L (ref 98–111)
Creatinine, Ser: 1.11 mg/dL (ref 0.61–1.24)
GFR, Estimated: >60 mL/min (ref >60)
Glucose, Bld: 100 mg/dL — ABNORMAL HIGH (ref 70–99)
Potassium: 4.2 mmol/L (ref 3.5–5.1)
Sodium: 134 mmol/L — ABNORMAL LOW (ref 135–145)

## 2023-05-27 LAB — URIC ACID: Uric Acid, Serum: 4.8 mg/dL (ref 3.7–8.6)

## 2023-05-27 MED ORDER — MUSCLE RUB 10-15 % EX CREA
TOPICAL_CREAM | CUTANEOUS | Status: DC | PRN
  Filled 2023-05-27: qty 85

## 2023-05-27 MED ORDER — METHOCARBAMOL 500 MG PO TABS
500.0000 mg | ORAL_TABLET | Freq: Four times a day (QID) | ORAL | Status: DC
  Administered 2023-05-27 – 2023-05-29 (×7): 500 mg via ORAL
  Filled 2023-05-27 (×8): qty 1

## 2023-05-27 NOTE — Progress Notes (Signed)
 Progress Note   Patient: Samuel Little:[id number] DOB: 01/11/1933 DOA: 05/22/2023     4 DOS: the patient was seen and examined on 05/27/2023   Brief hospital course: 89yo with h/o HTN, AVNRT s/p ablation, hypothyroidism, pancytopenia, and lumbar stenosis with neurogenic claudication who presented on 3/10 with LBP and hematuria.  He was recently treated for MSSA UTI with 10 days of Macrobid.  CT with B nonobstructing intrarenal calculi without hydronephrosis.  He was found to have MSSA UTI with bacteremia and was started on Ceftriaxone.  CT maxillofacial was unremarkable.  Assessment and Plan:  Acute MSSA UTI with hematuria and bacteremia Recent MSSA UTI, completed 10-day course of Macrobid  Presented with hematuria and bilateral flank pain  Urinalysis with bacteria, leukocytes and blood  CT renal study with nonobstructing intrarenal calculi, no hydronephrosis, no suspicious bladder lesions Blood culture is growing MSSA Ceftriaxone -> Cefazolin ID consulting Repeat blood cultures are NTD x 2 days   AKI, resolved Baseline creatinine 0.8 Presented with creatinine 1.3 in the setting of decreased oral intake  Renal ultrasound showed bilateral nonobstructing renal calculi, echogenic material within the urinary bladder which may have been sequelae associated with recent infection Renal function continued to gradually worsen despite IVF but has now returned to baseline Continue to avoid nephrotoxic agents when possible   Hx of pancytopenia Continues to worsen, particularly thrombocytopenia Followed by ID 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 Dr. Shirline Frees Previously with concern that gout medicine (allopurinol) was contributing, but it was stopped about a month ago without improvement Continue prednisone 20 mg daily SCDs for DVT ppx Consulted oncology given worsening despite stopping allopurinol and starting prednisone Transfuse  for platelets <20, <50 with bleeding Dr. Arbutus Ped gave IVIG on 3/13 Platelets 18 on 3/14, transfused 2 units platelets Platelets improved to 25k 3/15 with slight uptick in Hgb as well - hopefully IVIG is working   Lumbar spinal stenosis with neurogenic claudication MRI T/L-spine with unchanged severe R L1-2, L 3-4, and B L5-S1 stenosis heating pad, lidocaine patch Tylenol every 6 hours as needed, oxycodone 5 mg every 4 hours as needed for pain control Will add Robaxin PT/OT eval -> recommending SNF rehab Patient reports little improvement after treatment of UTI He appears to be an unlikely surgical candidate at this time Will consult palliative care for assistance with pain control and also GOC discussion   Hypertension BP 112/56-158/68, currently 120/57 Holding doxazosin, lisinopril   Macular degeneration Reports that he is no longer able to read On Eylea injections   Hypothyroidism Continue home Synthroid Normal TSH   Goals of care He reports having a living will [facility] documents indicate a desire for full scope of care with resuscitation However, patient reports that he would prefer to "die naturally" Discussed with wife, changed to DNR He has been accepted to [facility] once he is better, with improvement in platelets and back pain - hopefully before 3/18 when his authorization expires Awaiting palliative care consult        Consultants: ID Oncology Palliative PT OT TOC team   Procedures: None   Antibiotics: Ceftriaxone x 2 doses Cefazolin 3/11-  30 Day Unplanned Readmission Risk Score    Flowsheet Row ED to Hosp-Admission (Current) from 05/22/2023 in [facility] GENERAL MED/SURG UNIT  30 Day Unplanned Readmission Risk Score (%) 15.53 Filed at 05/27/2023 0800       This score is the patient's risk of an unplanned  readmission within 30 days of being discharged (0 -100%). The score is based on dignosis, age, lab data, medications, orders, and past  utilization.   Low:  0-14.9   Medium: 15-21.9   High: 22-29.9   Extreme: 30 and above           Subjective: Continues to report back pain.  Not feeling much better but no significant change.  He complained of L knee pain today.   Objective: Vitals:   05/27/23 0415 05/27/23 0734  BP: (!) 149/64 (!) 157/68  Pulse: 68 69  Resp: 19 16  Temp: 98.1 F (36.7 C) 98.4 F (36.9 C)  SpO2: 95% 93%    Intake/Output Summary (Last 24 hours) at 05/27/2023 1528 Last data filed at 05/27/2023 0506 Gross per 24 hour  Intake 1000 ml  Output 1250 ml  Net -250 ml   Filed Weights   05/22/23 2350  Weight: 86.5 kg    Exam:  General:  Appears calm and comfortable and is in NAD Eyes:  EOMI, normal lids, iris ENT:   very hard of hearing, grossly normal lips & tongue, mmm Neck:  no LAD, masses or thyromegaly Cardiovascular:  RRR, no m/r/g. 2+ LE edema.  Respiratory:   CTA bilaterally with no wheezes/rales/rhonchi.  Normal respiratory effort. Abdomen:  soft, NT, ND Skin:  no rash or induration seen on limited exam Musculoskeletal:  grossly normal tone BUE/BLE, good ROM, no bony abnormality Psychiatric:  blunted mood and affect, speech fluent and appropriate, AOx3 Neurologic:  CN 2-12 grossly intact, moves all extremities in coordinated fashion  Data Reviewed: I have reviewed the patient's lab results since admission.  Pertinent labs for today include:   BUN 35/Creatinine 1.11/GFR >60, stable WBC 5.8 Hgb 7.8, stable Platelets 25, improved from 18     Family Communication: None present; I spoke with his daughter and wife by telephone  Disposition: Status is: Inpatient Remains inpatient appropriate because: ongoing management     Time spent: 50 minutes  Unresulted Labs (From admission, onward)     Start     Ordered   05/27/23 0500  CBC with Differential/Platelet  Daily,   R      05/26/23 1407   05/27/23 0500  Basic metabolic panel  Daily,   R      05/26/23 1407              Author: Jonah Blue, MD 05/27/2023 3:28 PM  For on call review [email or web address].

## 2023-05-27 NOTE — Progress Notes (Signed)
 Physical Therapy Treatment Patient Details Name: Samuel Little MRN: [id number] DOB: [birth year] Today's Date: 05/27/2023   History of Present Illness 89 yo lower back/ flank pain. Recent UTI with treatment.  UTI with hematuria CT (+) renal stone study showing bilateral nonobstructive intrarenal calculi without hydronephrosis. PMH: back pain, hematuria, HTN, pancytopenia, gout, hypthyroidism, HOH with hearing aides, macular degeneration, RBBB, wear glasses R TKA, R rotator cuff repair,    PT Comments  Patient continues to be limited in transfers and mobility due to generalized weakness, debility and decreased balance.   Patient will benefit from continued inpatient follow up therapy, <3 hours/day.    If plan is discharge home, recommend the following: Two people to help with walking and/or transfers;Two people to help with bathing/dressing/bathroom;Assistance with cooking/housework;Help with stairs or ramp for entrance;Assist for transportation   Can travel by private vehicle     No  Equipment Recommendations  None recommended by PT    Recommendations for Other Services       Precautions / Restrictions Precautions Precautions: Fall Restrictions Weight Bearing Restrictions Per Provider Order: No     Mobility  Bed Mobility Overal bed mobility: Needs Assistance Bed Mobility: Supine to Sit     Supine to sit: Mod assist, HOB elevated, Used rails     General bed mobility comments: cues for technque with assistance with BLEs and trunk    Transfers Overall transfer level: Needs assistance Equipment used: Rolling walker (2 wheels) Transfers: Sit to/from Stand Sit to Stand: Mod assist, From elevated surface           General transfer comment: cues for hand placement    Ambulation/Gait Ambulation/Gait assistance: Mod assist Gait Distance (Feet): 4 Feet Assistive device: Rolling walker (2 wheels) Gait Pattern/deviations: Step-to pattern, Decreased stride length, Knee  flexed in stance - right, Knee flexed in stance - left, Shuffle, Trunk flexed, Narrow base of support Gait velocity: very slow     General Gait Details: crouched posture with heavy reliance on UE support on RW   Stairs             Wheelchair Mobility     Tilt Bed    Modified [name] (Stroke Patients Only)       Balance Overall balance assessment: Needs assistance Sitting-balance support: Feet supported, Bilateral upper extremity supported Sitting balance-Leahy Scale: Fair     Standing balance support: During functional activity, Reliant on assistive device for balance, Bilateral upper extremity supported Standing balance-Leahy Scale: Poor                              Communication Communication Communication: Impaired Factors Affecting Communication: Hearing impaired  Cognition Arousal: Alert Behavior During Therapy: WFL for tasks assessed/performed                             Following commands: Intact      Cueing Cueing Techniques: Verbal cues, Tactile cues  Exercises      General Comments        Pertinent Vitals/Pain Pain Assessment Pain Assessment: Faces Faces Pain Scale: Hurts little more Pain Location: low back and neck Pain Descriptors / Indicators: Discomfort, Aching    Home Living                          Prior Function  PT Goals (current goals can now be found in the care plan section) Progress towards PT goals: Progressing toward goals    Frequency    Min 2X/week      PT Plan      Co-evaluation              AM-PAC PT "6 Clicks" Mobility   Outcome Measure  Help needed turning from your back to your side while in a flat bed without using bedrails?: A Lot Help needed moving from lying on your back to sitting on the side of a flat bed without using bedrails?: A Lot Help needed moving to and from a bed to a chair (including a wheelchair)?: A Lot Help needed standing up from  a chair using your arms (e.g., wheelchair or bedside chair)?: A Lot Help needed to walk in hospital room?: Total Help needed climbing 3-5 steps with a railing? : Total 6 Click Score: 10    End of Session Equipment Utilized During Treatment: Gait belt Activity Tolerance: Patient tolerated treatment well;Patient limited by fatigue Patient left: in chair;with call bell/phone within reach;with chair alarm set Nurse Communication: Mobility status PT Visit Diagnosis: Muscle weakness (generalized) (M62.81);Difficulty in walking, not elsewhere classified (R26.2);Pain;Unsteadiness on feet (R26.81) Pain - Right/Left: Left     Time: 1550-1610 PT Time Calculation (min) (ACUTE ONLY): 20 min  Charges:    $Therapeutic Activity: 8-22 mins PT General Charges $$ ACUTE PT VISIT: 1 Visit                     05/27/2023 Delray Alt, PT Acute Rehabilitation Services Office:  [phone]    Olivia Canter 05/27/2023, 4:36 PM

## 2023-05-27 NOTE — Plan of Care (Signed)

## 2023-05-27 NOTE — Progress Notes (Signed)
 Samuel Little is a little bit tired this morning.  He really does not have any obvious complaints.  He continues on Levaquin and Ancef.  He does have Staph aureus in the blood and Staph/Pseudomonas in the urine.  His labs this morning show white cell count of 5.8.  Hemoglobin 7.8.  Platelet count 25,000.  MCV is 101.  His sodium is 134.  Potassium 4.2.  BUN 35 creatinine 1.11.  Calcium 8.2.  Have to believe that there is probably an element of erythropoietin deficiency which might be contributing to the anemia.  I know he has had a bone marrow biopsy done.  These cytogenetics were negative.  However, I was thinking that it might be worthwhile sending off NGS studies on his blood to maybe show the likelihood of myelodysplasia.  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  It is hard to say how well he is eating.  His vital signs are pretty stable.  Temperature 98.4.  Pulse 69.  Blood pressure 157/68.  His exam shows no oral lesions.  He has no adenopathy in the neck.  Lungs are clear bilaterally.  He has good air movement bilaterally.  Cardiac exam is regular rate and rhythm.  He does have a 2/6 systolic ejection murmur.  Abdomen is soft.  I cannot palpate any obvious hepatosplenomegaly.  Again, his white cell count is okay.  This I think is a positive for him.  He does not have any bleeding so his thrombocytopenia really should not be much of an issue.  Hopefully, with his infection, his bone marrow will improve.  I think that his age, any stress on the bone marrow will definitely cause him to exacerbate any cytopenia.  Christin Bach, MD  [name] 1:5

## 2023-05-28 DIAGNOSIS — Z7189 Other specified counseling: Secondary | ICD-10-CM

## 2023-05-28 DIAGNOSIS — N390 Urinary tract infection, site not specified: Secondary | ICD-10-CM | POA: Diagnosis not present

## 2023-05-28 DIAGNOSIS — Z515 Encounter for palliative care: Secondary | ICD-10-CM

## 2023-05-28 LAB — CBC WITH DIFFERENTIAL/PLATELET
Abs Immature Granulocytes: 0.15 10*3/uL — ABNORMAL HIGH (ref 0.00–0.07)
Basophils Absolute: 0 10*3/uL (ref 0.0–0.1)
Basophils Relative: 0 %
Eosinophils Absolute: 0 10*3/uL (ref 0.0–0.5)
Eosinophils Relative: 0 %
HCT: 24.2 % — ABNORMAL LOW (ref 39.0–52.0)
Hemoglobin: 8.2 g/dL — ABNORMAL LOW (ref 13.0–17.0)
Immature Granulocytes: 2 %
Lymphocytes Relative: 18 %
Lymphs Abs: 1.2 10*3/uL (ref 0.7–4.0)
MCH: 34.2 pg — ABNORMAL HIGH (ref 26.0–34.0)
MCHC: 33.9 g/dL (ref 30.0–36.0)
MCV: 100.8 fL — ABNORMAL HIGH (ref 80.0–100.0)
Monocytes Absolute: 1.1 10*3/uL — ABNORMAL HIGH (ref 0.1–1.0)
Monocytes Relative: 17 %
Neutro Abs: 3.9 10*3/uL (ref 1.7–7.7)
Neutrophils Relative %: 63 %
Platelets: 23 10*3/uL — CL (ref 150–400)
RBC: 2.4 MIL/uL — ABNORMAL LOW (ref 4.22–5.81)
RDW: 21.2 % — ABNORMAL HIGH (ref 11.5–15.5)
Smear Review: DECREASED
WBC: 6.3 10*3/uL (ref 4.0–10.5)
nRBC: 1 % — ABNORMAL HIGH (ref 0.0–0.2)

## 2023-05-28 LAB — PREPARE PLATELET PHERESIS
Unit division: 0
Unit division: 0

## 2023-05-28 LAB — BPAM PLATELET PHERESIS
Blood Product Expiration Date: 202503162359
Blood Product Expiration Date: 202503162359
ISSUE DATE / TIME: 202503142138
ISSUE DATE / TIME: 202503150014
Unit Type and Rh: 7300
Unit Type and Rh: 7300

## 2023-05-28 LAB — CULTURE, BLOOD (ROUTINE X 2)
Culture: NO GROWTH
Special Requests: ADEQUATE

## 2023-05-28 LAB — BASIC METABOLIC PANEL
Anion gap: 9 (ref 5–15)
BUN: 32 mg/dL — ABNORMAL HIGH (ref 8–23)
CO2: 22 mmol/L (ref 22–32)
Calcium: 8.2 mg/dL — ABNORMAL LOW (ref 8.9–10.3)
Chloride: 103 mmol/L (ref 98–111)
Creatinine, Ser: 0.99 mg/dL (ref 0.61–1.24)
GFR, Estimated: >60 mL/min (ref >60)
Glucose, Bld: 101 mg/dL — ABNORMAL HIGH (ref 70–99)
Potassium: 4.3 mmol/L (ref 3.5–5.1)
Sodium: 134 mmol/L — ABNORMAL LOW (ref 135–145)

## 2023-05-28 MED ORDER — ACETAMINOPHEN 500 MG PO TABS
1000.0000 mg | ORAL_TABLET | Freq: Three times a day (TID) | ORAL | Status: DC
Start: 2023-05-28 — End: 2023-05-29
  Administered 2023-05-28 – 2023-05-29 (×3): 1000 mg via ORAL
  Filled 2023-05-28 (×3): qty 2

## 2023-05-28 MED ORDER — GABAPENTIN 100 MG PO CAPS
100.0000 mg | ORAL_CAPSULE | Freq: Every day | ORAL | Status: DC
  Administered 2023-05-28: 100 mg via ORAL
  Filled 2023-05-28: qty 1

## 2023-05-28 NOTE — Plan of Care (Signed)

## 2023-05-28 NOTE — Plan of Care (Signed)

## 2023-05-28 NOTE — Progress Notes (Signed)
 Progress Note   Patient: Samuel Little ZOX:[id number] DOB: [birth year] DOA: 05/22/2023     5 DOS: the patient was seen and examined on 05/28/2023   Brief hospital course: 89yo with h/o HTN, AVNRT s/p ablation, hypothyroidism, pancytopenia, and lumbar stenosis with neurogenic claudication who presented on 3/10 with LBP and hematuria.  He was recently treated for MSSA UTI with 10 days of Macrobid.  CT with B nonobstructing intrarenal calculi without hydronephrosis.  He was found to have MSSA UTI with bacteremia and was started on Ceftriaxone.  CT maxillofacial was unremarkable.  Assessment and Plan:  Acute MSSA UTI with hematuria and bacteremia Recent MSSA UTI, completed 10-day course of Macrobid  Presented with hematuria and bilateral flank pain  Urinalysis with bacteria, leukocytes and blood  CT renal study with nonobstructing intrarenal calculi, no hydronephrosis, no suspicious bladder lesions Blood culture is growing MSSA Ceftriaxone -> Cefazolin ID consulting Repeat blood cultures are NTD x 3 days Maxillofacial CT negative for source of infection   Hx of pancytopenia Followed by oncology Dr. 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 Arbutus Ped gave IVIG on 3/13 and transfused 2 units platelets on 3/14 Platelets and Hgb marginal but stable at this time SCDs for DVT ppx   Lumbar spinal stenosis with neurogenic claudication MRI T/L-spine with unchanged severe R L1-2, L 3-4, and B L5-S1 stenosis Pain control with heating pad, lidocaine patch, Tylenol, oxycodone, standing Robaxin PT/OT eval -> recommending SNF rehab He appears to be an  unlikely surgical candidate at this time, unlikely to be able to get IR intervention due to platelet count Will consult palliative care for assistance with pain control and also GOC discussion   Hypertension BP 112/56-158/68, currently 120/57 Holding doxazosin, lisinopril   Macular degeneration Reports that he is no longer able to read On Eylea injections   Hypothyroidism Continue home Synthroid Normal TSH   Goals of care He reports having a living will [employer] documents indicate a desire for full scope of care with resuscitation However, patient reports that he would prefer to "die naturally", wife agreed, changed to DNR He has been accepted to [facility] once he is better, with improvement in platelets and back pain - hopefully before 3/18 when his authorization expires Awaiting palliative care consult        Consultants: ID Oncology Palliative PT OT TOC team   Procedures: None   Antibiotics: Ceftriaxone x 2 doses Cefazolin 3/11-  30 Day Unplanned Readmission Risk Score    Flowsheet Row ED to Hosp-Admission (Current) from 05/22/2023 in [facility] GENERAL MED/SURG UNIT  30 Day Unplanned Readmission Risk Score (%) 15.72 Filed at 05/28/2023 0401       This score is the patient's risk of an unplanned readmission within 30 days of being discharged (0 -100%). The score is based on dignosis, age, lab data, medications, orders, and past utilization.   Low:  0-14.9   Medium: 15-21.9   High: 22-29.9   Extreme: 30 and above           Subjective: Still having pain.  Sitting up in chair, reports that he has been in the chair since yesterday due to pain while lying down (?)  Objective: Vitals:   05/28/23 0538 05/28/23 0732  BP: (!) 141/73 138/61  Pulse: 64 62  Resp: 20 16  Temp: 98.5 F (36.9 C) 97.8 F (36.6 C)  SpO2: 96% 97%    Intake/Output Summary (Last 24 hours) at 05/28/2023 1351 Last data filed at 05/27/2023 1700 Gross per 24 hour  Intake --   Output 500 ml  Net -500 ml   Filed Weights   05/22/23 2350  Weight: 86.5 kg    Exam:  General:  Appears calm and comfortable and is in NAD Eyes:  EOMI, normal lids, iris ENT:   very hard of hearing, grossly normal lips & tongue, mmm Neck:  no LAD, masses or thyromegaly Cardiovascular:  RRR, no m/r/g. 2+ LE edema.  Respiratory:   CTA bilaterally with no wheezes/rales/rhonchi.  Normal respiratory effort. Abdomen:  soft, NT, ND Skin:  no rash or induration seen on limited exam Musculoskeletal:  grossly normal tone BUE/BLE, good ROM, no bony abnormality Psychiatric:  blunted mood and affect, speech fluent and appropriate, AOx3 Neurologic:  CN 2-12 grossly intact, moves all extremities in coordinated fashion  Data Reviewed: I have reviewed the patient's lab results since admission.  Pertinent labs for today include:   Stable BMP WBC 6.3 Hgb 8.2, up from 7.8 Platelets 23, down from 25 on 3/15     Family Communication: None present  Disposition: Status is: Inpatient Remains inpatient appropriate because: ongoing management     Time spent: 35 minutes  Unresulted Labs (From admission, onward)     Start     Ordered   05/27/23 0500  CBC with Differential/Platelet  Daily,   R      05/26/23 1407   05/27/23 0500  Basic metabolic panel  Daily,   R      05/26/23 1407             Author: Jonah Blue, MD 05/28/2023 1:51 PM  For on call review [email or web address].

## 2023-05-28 NOTE — Consult Note (Cosign Needed Addendum)
 Palliative Medicine Inpatient Consult Note  Consulting Provider: Dr. Ophelia Charter  Reason for consult:   Palliative Care Consult Services Symptom Management Consult  Reason for Consult? back pain; can also address GOC if possible   05/28/2023  HPI:  Per intake H&P -->  89yo with h/o HTN, AVNRT s/p ablation, hypothyroidism, pancytopenia, and lumbar stenosis with neurogenic claudication who presented on 3/10 with LBP and hematuria. He was recently treated for MSSA UTI with 10 days of Macrobid. CT with B nonobstructing intrarenal calculi without hydronephrosis. He was found to have MSSA UTI with bacteremia and was started on Ceftriaxone. CT maxillofacial was unremarkable.    Clinical Assessment/Goals of Care:  *Please note that this is a verbal dictation therefore any spelling or grammatical errors are due to the "Dragon Medical One" system interpretation.  I have reviewed medical records including EPIC notes, labs and imaging, received report from bedside RN, assessed the patient who is sitting up in the recliner chair. He states that he just received medicine to support pain in his lower back.    I met with Freddi Starr to further discuss diagnosis prognosis, GOC, EOL wishes, disposition and options.   I introduced Palliative Medicine as specialized medical care for people living with serious illness. It focuses on providing relief from the symptoms and stress of a serious illness. The goal is to improve quality of life for both the patient and the family.  Medical History Review and Understanding:  Past medical history significant for pancytopenia for which she follows with Dr. Shirline Frees, hypothyroidism, lumbar stenosis with neurogenic claudication, and hypothyroidism was held.  Social History:  Lupe lives in [place].  He and his wife have been married since the late 51s.  They share 2 sons and 1 daughter, 9 grandchildren, and 2 great-grandchildren. Corin formally  worked for the [place] [employer] for 45 years.  He is a man of faith practicing within the [employer] denomination.  He used to like working outside in the yard there is of the last 2 years since having been afflicted by pneumonia has not been able to do that. He now pays someone to do his yard work.  Functional and Nutritional State:  Preceding hospitalization, Farley lived in a single-family home with his wife.  He shares he is able to mobilize with a front wheel walker.  He did have a good appetite.   Palliative Symptoms:  Lower back pain from spinal stenosis. Arther shares that oxycodone does help though only for short time. Sahand notes that he is more weak in his lower extremities.  He is open to the idea of a spinal injection for pain control.  Was on gabapentin back in 2018 is willing to try this medication again.  Advance Directives:  A detailed discussion was had today regarding advanced directives.  He does have advanced directives.  I shared I would request a copy from his wife which she is agreeable to.  Code Status:  Concepts specific to code status, artifical feeding and hydration, continued IV antibiotics and rehospitalization was had.  The difference between a aggressive medical intervention path  and a palliative comfort care path for this patient at this time was had.   Winnie ss an established DO NOT RESUSCITATE DO NOT INTUBATE CODE STATUS.  This was again reviewed and confirmed.   Discussion:  Jamarri and I reviewed the reason for hospitalization inclusive of patients bacteremia.  Josean shares that he feels he was functioning fairly well up until 2  years ago when he had a pretty severe pneumonia leading to a 5-day hospitalization.  After that he has had some declines and decreased ability with mobility.  He notes he has had roughly 2 falls.  He shares in addition he has worsening shooting pain from his lower back down to his feet.   We reviewed the best case and worst-case  scenarios of Darvins present health the best case being that he goes to rehabilitation and is optimized and able to perform activities of daily living for himself.  He shares with me that that would be his goal as well as not being in debilitating pain for much of the day.  We discussed the worst case scenario being that Hykeem becomes more and more immobile to the point where he is bed bound.  I discussed with him how this relates to quality of life.  He shares with me that he already has other debility in the setting of his macular degeneration and impaired hearing.  We reviewed if it gets to a point where progress is not seen or his mortality is apparent then he is accepting of this.  He does lean into his faith.  I shared I would reach out to Kreig's spouse to see if we could meet together and complete a MOST form to better delineate patient's wishes moving forward.  Discussed the importance of continued conversation with family and their  medical providers regarding overall plan of care and treatment options, ensuring decisions are within the context of the patients values and GOCs. ___________________________ Addendum:  I called and spoke with patient's wife, Zacarias Krauter.  We discussed the above conversation.  She shares that she will bring in a copy of patient's living will.  She is in agreement with completing another MOST form after I described exactly what this is and what the purpose of it is used for. _____________________________ Addendum 2:  I met at bedside with Jlyn, his wife, Gardiner Ramus, and his daughter, Bonita Quin.  A copy of patients living will was obtained.  I completed a MOST form today. The patient and family outlined their wishes for the following treatment decisions:  Cardiopulmonary Resuscitation: Do Not Attempt Resuscitation (DNR/No CPR)  Medical Interventions: Limited Additional Interventions: Use medical treatment, IV fluids and cardiac monitoring as indicated, DO NOT  USE intubation or mechanical ventilation. May consider use of less invasive airway support such as BiPAP or CPAP. Also provide comfort measures. Transfer to the hospital if indicated. Avoid intensive care.   Antibiotics: Antibiotics if indicated  IV Fluids: IV fluids if indicated  Feeding Tube: Feeding tube for a defined trial period   Detailed discussions of patients clinical state held. Reviewed his decline over the past two years. Discussed the "what if's" of patients condition. Plan to see if he can improve at [facility]. If continued to decline we discuss potential long term placement options in addition to hospice.  Family is interested in steroid injection by IR.  Will see how Malosi responds to gabapentin.   Add Time: 10  Decision Maker: Hornbrook,Lillian (Spouse): [phone] (Mobile)   SUMMARY OF RECOMMENDATIONS   DNAR/DNI  New MOST completed  Obtained Living will  Ongoing PMT support - Lorinda Creed will see [facility] starting Tomorrow  Code Status/Advance Care Planning: DNAR/DNI   Symptom Management:  Chronic Lower Back Pain: - Tylenol 1 G TID - Start Gabapentin 100mg  PO at bedtime - Continue robaxin QID - Oxycodone 5mg  Q4H PRN - Lidocaine Patch 5% Q12H  -  Muscle Rub PRN - Consider IR consult for spinal steroidal injection once thrombocytopenia stabilizes  Bowel Ppx: - Continue Miralax 17g QDay  Palliative Prophylaxis:  Aspiration, Bowel Regimen, Delirium Protocol, Frequent Pain Assessment, Oral Care, Palliative Wound Care, and Turn Reposition  Additional Recommendations (Limitations, Scope, Preferences): Avoid Hospitalization, Full Scope Treatment, No Artificial Feeding, No Surgical Procedures, and No Tracheostomy  Psycho-social/Spiritual:  Desire for further Chaplaincy support: Yes Additional Recommendations: Education on spinal stenosis and advanced care planning   Prognosis: Increase co-morbidities, age, and limited mobility place Negan at a higher  mortality risk.   Discharge Planning: Discharge to [facility] once medically optimized.   Vitals:   05/27/23 2017 05/28/23 0538  BP: (!) 158/69 (!) 141/73  Pulse: 68 64  Resp: 20 20  Temp: 98.7 F (37.1 C) 98.5 F (36.9 C)  SpO2: 97% 96%    Intake/Output Summary (Last 24 hours) at 05/28/2023 0715 Last data filed at 05/27/2023 1700 Gross per 24 hour  Intake --  Output 500 ml  Net -500 ml   Last Weight  Most recent update: 05/22/2023 11:50 PM    Weight  86.5 kg (190 lb 11.2 oz)            Gen:  Elderly Caucasian M chronically ill appearing HEENT: moist mucous membranes CV: Regular rate and rhythm  PULM:  On RA, breathing is even and nonlabored ABD: soft/nontender  EXT: No edema  Neuro: Alert and oriented x3 - hard of hearing  PPS: 50%   This conversation/these recommendations were discussed with patient primary care team, Dr. Ophelia Charter  Billing based on MDM: High ______________________________________________________ Lamarr Lulas [facility] Palliative Medicine Team Team Cell Phone: [phone] Please utilize secure chat with additional questions, if there is no response within 30 minutes please call the above phone number  Palliative Medicine Team providers are available by phone from 7am to 7pm daily and can be reached through the team cell phone.  Should this patient require assistance outside of these hours, please call the patient's attending physician.

## 2023-05-29 ENCOUNTER — Other Ambulatory Visit: Payer: Self-pay

## 2023-05-29 DIAGNOSIS — D61818 Other pancytopenia: Secondary | ICD-10-CM

## 2023-05-29 DIAGNOSIS — M48062 Spinal stenosis, lumbar region with neurogenic claudication: Secondary | ICD-10-CM

## 2023-05-29 DIAGNOSIS — R531 Weakness: Secondary | ICD-10-CM

## 2023-05-29 DIAGNOSIS — M545 Low back pain, unspecified: Secondary | ICD-10-CM

## 2023-05-29 DIAGNOSIS — N390 Urinary tract infection, site not specified: Secondary | ICD-10-CM | POA: Diagnosis not present

## 2023-05-29 LAB — CBC WITH DIFFERENTIAL/PLATELET
Abs Immature Granulocytes: 0.13 10*3/uL — ABNORMAL HIGH (ref 0.00–0.07)
Basophils Absolute: 0 10*3/uL (ref 0.0–0.1)
Basophils Relative: 0 %
Eosinophils Absolute: 0 10*3/uL (ref 0.0–0.5)
Eosinophils Relative: 0 %
HCT: 26.7 % — ABNORMAL LOW (ref 39.0–52.0)
Hemoglobin: 8.8 g/dL — ABNORMAL LOW (ref 13.0–17.0)
Immature Granulocytes: 3 %
Lymphocytes Relative: 24 %
Lymphs Abs: 1.2 10*3/uL (ref 0.7–4.0)
MCH: 33.5 pg (ref 26.0–34.0)
MCHC: 33 g/dL (ref 30.0–36.0)
MCV: 101.5 fL — ABNORMAL HIGH (ref 80.0–100.0)
Monocytes Absolute: 1.1 10*3/uL — ABNORMAL HIGH (ref 0.1–1.0)
Monocytes Relative: 21 %
Neutro Abs: 2.7 10*3/uL (ref 1.7–7.7)
Neutrophils Relative %: 52 %
Platelets: 22 10*3/uL — CL (ref 150–400)
RBC: 2.63 MIL/uL — ABNORMAL LOW (ref 4.22–5.81)
RDW: 21.1 % — ABNORMAL HIGH (ref 11.5–15.5)
WBC: 5.2 10*3/uL (ref 4.0–10.5)
nRBC: 1.5 % — ABNORMAL HIGH (ref 0.0–0.2)

## 2023-05-29 LAB — BASIC METABOLIC PANEL
Anion gap: 5 (ref 5–15)
BUN: 27 mg/dL — ABNORMAL HIGH (ref 8–23)
CO2: 23 mmol/L (ref 22–32)
Calcium: 8.2 mg/dL — ABNORMAL LOW (ref 8.9–10.3)
Chloride: 103 mmol/L (ref 98–111)
Creatinine, Ser: 0.86 mg/dL (ref 0.61–1.24)
GFR, Estimated: >60 mL/min (ref >60)
Glucose, Bld: 93 mg/dL (ref 70–99)
Potassium: 3.9 mmol/L (ref 3.5–5.1)
Sodium: 131 mmol/L — ABNORMAL LOW (ref 135–145)

## 2023-05-29 MED ORDER — GABAPENTIN 100 MG PO CAPS: 100.0000 mg | ORAL_CAPSULE | Freq: Every day | ORAL | 0 refills | Status: DC

## 2023-05-29 MED ORDER — MUSCLE RUB 10-15 % EX CREA: 1.0000 | TOPICAL_CREAM | CUTANEOUS | Status: DC | PRN

## 2023-05-29 MED ORDER — DOCUSATE SODIUM 100 MG PO CAPS: 100.0000 mg | ORAL_CAPSULE | Freq: Two times a day (BID) | ORAL | Status: DC

## 2023-05-29 MED ORDER — SODIUM CHLORIDE 0.9% FLUSH
10.0000 mL | Freq: Two times a day (BID) | INTRAVENOUS | Status: DC
  Administered 2023-05-29: 10 mL

## 2023-05-29 MED ORDER — SODIUM CHLORIDE 0.9% FLUSH: 10.0000 mL | INTRAVENOUS | Status: DC | PRN

## 2023-05-29 MED ORDER — NAPHAZOLINE-GLYCERIN 0.012-0.25 % OP SOLN
1.0000 [drp] | Freq: Four times a day (QID) | OPHTHALMIC | Status: DC | PRN
Start: 2023-05-29 — End: 2023-06-11

## 2023-05-29 MED ORDER — CHLORHEXIDINE GLUCONATE CLOTH 2 % EX PADS
6.0000 | MEDICATED_PAD | Freq: Every day | CUTANEOUS | Status: DC
Start: 2023-05-29 — End: 2023-05-29
  Administered 2023-05-29: 6 via TOPICAL

## 2023-05-29 MED ORDER — CEFAZOLIN IV (FOR PTA / DISCHARGE USE ONLY): 2.0000 g | Freq: Three times a day (TID) | INTRAVENOUS | 0 refills | Status: DC

## 2023-05-29 MED ORDER — LIDOCAINE 5 % EX PTCH
1.0000 | MEDICATED_PATCH | CUTANEOUS | Status: DC
Start: 2023-05-30 — End: 2023-06-11

## 2023-05-29 MED ORDER — METHOCARBAMOL 500 MG PO TABS: 500.0000 mg | ORAL_TABLET | Freq: Four times a day (QID) | ORAL | Status: DC

## 2023-05-29 MED ORDER — POLYETHYLENE GLYCOL 3350 17 G PO PACK
17.0000 g | PACK | Freq: Every day | ORAL | Status: DC
Start: 2023-05-30 — End: 2023-06-11

## 2023-05-29 MED ORDER — OXYCODONE HCL 5 MG PO TABS
5.0000 mg | ORAL_TABLET | ORAL | 0 refills | Status: DC | PRN
Start: 2023-05-29 — End: 2023-06-11

## 2023-05-29 NOTE — Plan of Care (Signed)
  Problem: Education: Goal: Knowledge of General Education information will improve Description: Including pain rating scale, medication(s)/side effects and non-pharmacologic comfort measures Outcome: Progressing   Problem: Pain Managment: Goal: General experience of comfort will improve and/or be controlled Outcome: Progressing

## 2023-05-29 NOTE — Progress Notes (Signed)
 Report given to [facility] at [facility]. All questions answered.

## 2023-05-29 NOTE — Plan of Care (Signed)
  Problem: Education: Goal: Knowledge of General Education information will improve Description: Including pain rating scale, medication(s)/side effects and non-pharmacologic comfort measures Outcome: Adequate for Discharge   Problem: Health Behavior/Discharge Planning: Goal: Ability to manage health-related needs will improve Outcome: Adequate for Discharge   Problem: Clinical Measurements: Goal: Ability to maintain clinical measurements within normal limits will improve Outcome: Adequate for Discharge Goal: Will remain free from infection Outcome: Adequate for Discharge Goal: Diagnostic test results will improve Outcome: Adequate for Discharge Goal: Respiratory complications will improve Outcome: Adequate for Discharge Goal: Cardiovascular complication will be avoided Outcome: Adequate for Discharge   Problem: Activity: Goal: Risk for activity intolerance will decrease Outcome: Adequate for Discharge   Problem: Nutrition: Goal: Adequate nutrition will be maintained Outcome: Adequate for Discharge   Problem: Coping: Goal: Level of anxiety will decrease Outcome: Adequate for Discharge   Problem: Elimination: Goal: Will not experience complications related to bowel motility Outcome: Adequate for Discharge Goal: Will not experience complications related to urinary retention Outcome: Adequate for Discharge   Problem: Pain Managment: Goal: General experience of comfort will improve and/or be controlled Outcome: Adequate for Discharge   Problem: Safety: Goal: Ability to remain free from injury will improve Outcome: Adequate for Discharge   Problem: Skin Integrity: Goal: Risk for impaired skin integrity will decrease Outcome: Adequate for Discharge   Problem: Acute Rehab OT Goals (only OT should resolve) Goal: Pt. Will Perform Grooming Outcome: Adequate for Discharge Goal: Pt. Will Perform Upper Body Bathing Outcome: Adequate for Discharge Goal: Pt. Will Transfer To  Toilet Outcome: Adequate for Discharge Goal: OT Additional ADL Goal #1 Outcome: Adequate for Discharge Goal: OT Additional ADL Goal #2 Outcome: Adequate for Discharge   Problem: Acute Rehab PT Goals(only PT should resolve) Goal: Pt Will Go Supine/Side To Sit Outcome: Adequate for Discharge Goal: Pt Will Go Sit To Supine/Side Outcome: Adequate for Discharge Goal: Patient Will Transfer Sit To/From Stand Outcome: Adequate for Discharge Goal: Pt Will Ambulate Outcome: Adequate for Discharge

## 2023-05-29 NOTE — Care Management Important Message (Signed)
 Important Message  Patient Details  Name: Samuel Little MRN: [id number] Date of Birth: [birth year]   Important Message Given:  Yes - Medicare IM     Dorena Bodo 05/29/2023, 2:26 PM

## 2023-05-29 NOTE — Discharge Summary (Addendum)
 Physician Discharge Summary   Patient: Samuel Little MRN: [id number] DOB: 1932/05/29  Admit date:     05/22/2023  Discharge date: 05/29/23  Discharge Physician: Jonah Blue   PCP: Georgann Housekeeper, MD   Recommendations at discharge:   You are being discharged to [facility] for rehabilitation Follow up with Dr. Donette Larry after discharge from rehab Continue IV Cefazolin through 07/06/23 Continue with daily CBC until platelets show consistent improvement; transfuse platelets for goal >20k (50k if bleeding) Continue outpatient palliative care Follow up with Dr. Arbutus Ped; call for appointment Follow up with infectious disease on 4/7 Follow up with dentist outpatient  Discharge Diagnoses: Principal Problem:   Acute UTI Active Problems:   Hypothyroidism   Essential hypertension   Other pancytopenia (HCC)   Spinal stenosis of lumbar region with neurogenic claudication   AKI (acute kidney injury) [facility])   Macular degeneration   Hospital Course: 89yo with h/o HTN, AVNRT s/p ablation, hypothyroidism, pancytopenia, and lumbar stenosis with neurogenic claudication who presented on 3/10 with LBP and hematuria.  He was recently treated for MSSA UTI with 10 days of Macrobid.  CT with B nonobstructing intrarenal calculi without hydronephrosis.  He was found to have MSSA UTI with bacteremia and was started on Ceftriaxone.  CT maxillofacial was unremarkable.  Assessment and Plan:  Acute MSSA UTI with hematuria and bacteremia Recent MSSA UTI, completed 10-day course of Macrobid  Presented with hematuria and bilateral flank pain  Urinalysis with bacteria, leukocytes and blood  CT renal study with nonobstructing intrarenal calculi, no hydronephrosis, no suspicious bladder lesions Blood culture is growing MSSA Ceftriaxone -> Cefazolin 2g IV q8h through 07/06/23 ID consulting Repeat blood cultures are NTD x 4 days Maxillofacial CT negative for source of infection PICC line will be placed prior  to dc   Hx of pancytopenia Followed by oncology Dr. 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 Arbutus Ped gave IVIG on 3/13 and transfused 2 units platelets on 3/14 Platelets stable and Hgb improving at this time SCDs for DVT ppx    Lumbar spinal stenosis with neurogenic claudication MRI T/L-spine with unchanged severe R L1-2, L 3-4, and B L5-S1 stenosis Pain control with heating pad, lidocaine patch, Tylenol, oxycodone, standing Robaxin PT/OT eval -> recommending SNF rehab He appears to be an unlikely surgical candidate at this time, unlikely to be able to get IR intervention due to platelet count Will consult palliative care for assistance with pain control and also GOC discussion   Hypertension BP 112/56-158/68, currently 120/57 Holding doxazosin, lisinopril   Macular degeneration Reports that he is no longer able to read On Eylea injections   Hypothyroidism Continue home Synthroid Normal TSH   Goals of care He reports having a living will [facility] documents indicate a desire for full scope of care with resuscitation However, patient reports that he would prefer to "die naturally", wife agreed, changed to DNR He has been accepted to [facility] once he is better, with improvement in platelets and back pain - hopefully before 3/18 when his authorization expires Awaiting palliative care consult        Consultants: ID Oncology Palliative PT OT TOC team   Procedures: None   Antibiotics: Ceftriaxone  x 2 doses Cefazolin 3/11-4/24   Pain control - [facility] Controlled Substance Reporting System database was  reviewed. and patient was instructed, not to drive, operate heavy machinery, perform activities at heights, swimming or participation in water activities or provide baby-sitting services while on Pain, Sleep and Anxiety Medications; until their outpatient Physician has advised to do so again. Also recommended to not to take more than prescribed Pain, Sleep and Anxiety Medications.   Disposition: Skilled nursing facility Diet recommendation:  Regular diet DISCHARGE MEDICATION: Allergies as of 05/29/2023       Reactions   Nsaids Other (See Comments)   JAUNDICE   Celecoxib Nausea And Vomiting   Meperidine Hcl Nausea Only        Medication List     TAKE these medications    acetaminophen 650 MG CR tablet Commonly known as: TYLENOL Take 1,300 mg every 8 (eight) hours as needed by mouth for pain.   ceFAZolin IVPB Commonly known as: ANCEF Inject 2 g into the vein every 8 (eight) hours. Indication:  MSSA Bacteremia First Dose: Yes Last Day of Therapy:  07/06/23 Labs - Once weekly:  CBC/D and BMP, Labs - Once weekly: ESR and CRP   docusate sodium 100 MG capsule Commonly known as: COLACE Take 1 capsule (100 mg total) by mouth 2 (two) times daily.   doxazosin 4 MG tablet Commonly known as: CARDURA Take 4 mg by mouth at bedtime.   gabapentin 100 MG capsule Commonly known as: NEURONTIN Take 1 capsule (100 mg total) by mouth at bedtime.   levothyroxine 150 MCG tablet Commonly known as: SYNTHROID Take 150 mcg by mouth daily before breakfast.   lidocaine 5 % Commonly known as: LIDODERM Place 1-3 patches onto the skin daily. Remove & Discard patch within 12 hours or as directed by MD Start taking on: May 30, 2023   lisinopril 30 MG tablet Commonly known as: ZESTRIL Take 30 mg by mouth daily.   methocarbamol 500 MG tablet Commonly known as: ROBAXIN Take 1 tablet (500 mg total) by mouth 4 (four) times daily.   Muscle Rub 10-15 % Crea Apply 1 Application topically as  needed for muscle pain.   naphazoline-glycerin 0.012-0.25 % Soln Commonly known as: CLEAR EYES REDNESS Place 1-2 drops into both eyes 4 (four) times daily as needed for eye irritation.   oxyCODONE 5 MG immediate release tablet Commonly known as: Oxy IR/ROXICODONE Take 1 tablet (5 mg total) by mouth every 4 (four) hours as needed for severe pain (pain score 7-10) or moderate pain (pain score 4-6).   polyethylene glycol 17 g packet Commonly known as: MIRALAX / GLYCOLAX Take 17 g by mouth daily. Start taking on: May 30, 2023   predniSONE 10 MG tablet Commonly known as: DELTASONE Take 2 tablets (20 mg total) by mouth daily with breakfast. Take 2 tablets daily x 2 weeks               Home Infusion Instuctions  (From admission, onward)           Start     Ordered   05/29/23 0000  Home infusion instructions       Question:  Instructions  Answer:  Flushing of vascular access device: 0.9% NaCl pre/post medication administration and prn patency; Heparin 100 u/ml, 5ml for implanted ports and Heparin 10u/ml, 5ml for all other central venous catheters.   05/29/23 1252            Discharge Exam:  Subjective: Still having pain but some better.  Prefers to go to SNF rehab today.     Objective: Vitals:   05/29/23 0512 05/29/23 0729  BP: (!) 194/80 (!) 150/58  Pulse: 65 71  Resp: 19 18  Temp: 97.6 F (36.4 C) 98 F (36.7 C)  SpO2: 97% 98%    Intake/Output Summary (Last 24 hours) at 05/29/2023 1252 Last data filed at 05/29/2023 1006 Gross per 24 hour  Intake 118 ml  Output 300 ml  Net -182 ml   Filed Weights   05/22/23 2350  Weight: 86.5 kg    Exam:  General:  Appears calm and comfortable and is in NAD Eyes:  EOMI, normal lids, iris ENT:   very hard of hearing, grossly normal lips & tongue, mmm Neck:  no LAD, masses or thyromegaly Cardiovascular:  RRR, no m/r/g. 2+ LE edema.  Respiratory:   CTA bilaterally with no wheezes/rales/rhonchi.  Normal  respiratory effort. Abdomen:  soft, NT, ND Skin:  no rash or induration seen on limited exam Musculoskeletal:  grossly normal tone BUE/BLE, good ROM, no bony abnormality Psychiatric:  blunted mood and affect, speech fluent and appropriate, AOx3 Neurologic:  CN 2-12 grossly intact, moves all extremities in coordinated fashion  Data Reviewed: I have reviewed the patient's lab results since admission.  Pertinent labs for today include:   Na++ 131 Stable/improving renal function Hgb 8.8, up from 8.2 Platelets 22, stable    Condition at discharge: stable  The results of significant diagnostics from this hospitalization (including imaging, microbiology, ancillary and laboratory) are listed below for reference.   Imaging Studies: [place] EKG SITE RITE Result Date: 05/29/2023 If Site Rite image not attached, placement could not be confirmed due to current cardiac rhythm.  CT MAXILLOFACIAL W CONTRAST Result Date: 05/26/2023 CLINICAL DATA:  Abscess EXAM: CT MAXILLOFACIAL WITH CONTRAST TECHNIQUE: Multidetector CT imaging of the maxillofacial structures was performed with intravenous contrast. Multiplanar CT image reconstructions were also generated. RADIATION DOSE REDUCTION: This exam was performed according to the departmental dose-optimization program which includes automated exposure control, adjustment of the mA and/or kV according to patient size and/or use of iterative reconstruction technique. CONTRAST:  75mL OMNIPAQUE IOHEXOL 350 MG/ML SOLN COMPARISON:  None Available. FINDINGS: Osseous: No fracture or mandibular dislocation. No destructive process. Orbits: Negative. No traumatic or inflammatory finding. Sinuses: Clear. Soft tissues: Negative. Limited intracranial: No significant or unexpected finding. IMPRESSION: No acute abnormality of the face. Electronically Signed   By: Deatra Robinson M.D.   On: 05/26/2023 00:31   ECHOCARDIOGRAM COMPLETE Result Date: 05/24/2023    ECHOCARDIOGRAM REPORT    Patient Name:   JOHNCARLO MAALOUF Date of Exam: 05/24/2023 Medical Rec #:  [id number]      Height:       73.0 in Accession #:    [id number]     Weight:       190.7 lb Date of Birth:  1932/11/09      BSA:          2.109 m Patient Age:    89 years       BP:           120/57 mmHg Patient Gender: M              HR:           74 bpm. Exam Location:  Inpatient Procedure: 2D Echo, Cardiac Doppler, Color Doppler and Intracardiac            Opacification Agent (Both  Spectral and Color Flow Doppler were            utilized during procedure). Indications:    Endocarditis  History:        Patient has prior history of Echocardiogram examinations, most                 recent 09/10/2022. Risk Factors:Hypertension.  Sonographer:    Amy Chionchio Referring Phys: [id number] [facility] IMPRESSIONS  1. Left ventricular ejection fraction, by estimation, is 60 to 65%. The left ventricle has normal function. The left ventricle has no regional wall motion abnormalities. There is mild concentric left ventricular hypertrophy. Left ventricular diastolic parameters were normal.  2. Right ventricular systolic function is normal. The right ventricular size is normal.  3. The mitral valve is degenerative. Trivial mitral valve regurgitation. No evidence of mitral stenosis.  4. Moderate tricuspid stenosis.  5. The aortic valve is tricuspid. There is mild calcification of the aortic valve. Aortic valve regurgitation is trivial. Aortic valve sclerosis/calcification is present, without any evidence of aortic stenosis.  6. Aortic dilatation noted. There is mild dilatation of the ascending aorta, measuring 43 mm. There is borderline dilatation of the aortic root, measuring 39 mm.  7. The inferior vena cava is normal in size with <50% respiratory variability, suggesting right atrial pressure of 8 mmHg. FINDINGS  Left Ventricle: Left ventricular ejection fraction, by estimation, is 60 to 65%. The left ventricle has normal function. The left ventricle has no  regional wall motion abnormalities. Definity contrast agent was given IV to delineate the left ventricular  endocardial borders. The left ventricular internal cavity size was normal in size. There is mild concentric left ventricular hypertrophy. Left ventricular diastolic parameters were normal. Right Ventricle: The right ventricular size is normal. No increase in right ventricular wall thickness. Right ventricular systolic function is normal. Left Atrium: Left atrial size was normal in size. Right Atrium: Right atrial size was normal in size. Pericardium: There is no evidence of pericardial effusion. Mitral Valve: The mitral valve is degenerative in appearance. There is mild calcification of the mitral valve leaflet(s). Trivial mitral valve regurgitation. No evidence of mitral valve stenosis. MV peak gradient, 2.4 mmHg. The mean mitral valve gradient  is 1.0 mmHg. Tricuspid Valve: The tricuspid valve is normal in structure. Tricuspid valve regurgitation is trivial. Moderate tricuspid stenosis. Aortic Valve: The aortic valve is tricuspid. There is mild calcification of the aortic valve. Aortic valve regurgitation is trivial. Aortic valve sclerosis/calcification is present, without any evidence of aortic stenosis. Aortic valve mean gradient measures 6.0 mmHg. Aortic valve peak gradient measures 12.0 mmHg. Aortic valve area, by VTI measures 2.33 cm. Pulmonic Valve: The pulmonic valve was normal in structure. Pulmonic valve regurgitation is trivial. No evidence of pulmonic stenosis. Aorta: Aortic dilatation noted. There is mild dilatation of the ascending aorta, measuring 43 mm. There is borderline dilatation of the aortic root, measuring 39 mm. Venous: The inferior vena cava is normal in size with less than 50% respiratory variability, suggesting right atrial pressure of 8 mmHg. IAS/Shunts: No atrial level shunt detected by color flow Doppler.  LEFT VENTRICLE PLAX 2D LVIDd:         5.20 cm      Diastology LVIDs:          3.70 cm      LV e' medial:    9.25 cm/s LV PW:         1.00 cm      LV E/e' medial:  8.7 LV IVS:        1.10 cm      LV e' lateral:   11.00 cm/s LVOT diam:     2.10 cm      LV E/e' lateral: 7.3 LV SV:         77 LV SV Index:   36 LVOT Area:     3.46 cm  LV Volumes (MOD) LV vol d, MOD A2C: 164.0 ml LV vol d, MOD A4C: 143.0 ml LV vol s, MOD A2C: 73.2 ml LV vol s, MOD A4C: 61.6 ml LV SV MOD A2C:     90.8 ml LV SV MOD A4C:     143.0 ml LV SV MOD BP:      97.2 ml RIGHT VENTRICLE             IVC RV Basal diam:  3.30 cm     IVC diam: 2.00 cm RV S prime:     11.40 cm/s TAPSE (M-mode): 1.7 cm LEFT ATRIUM              Index        RIGHT ATRIUM           Index LA Vol (A2C):   109.0 ml 51.70 ml/m  RA Area:     12.20 cm LA Vol (A4C):   93.8 ml  44.49 ml/m  RA Volume:   23.30 ml  11.05 ml/m LA Biplane Vol: 103.0 ml 48.85 ml/m  AORTIC VALVE                     PULMONIC VALVE AV Area (Vmax):    2.18 cm      PV Vmax:       0.86 m/s AV Area (Vmean):   2.10 cm      PV Peak grad:  3.0 mmHg AV Area (VTI):     2.33 cm AV Vmax:           173.00 cm/s AV Vmean:          119.000 cm/s AV VTI:            0.330 m AV Peak Grad:      12.0 mmHg AV Mean Grad:      6.0 mmHg LVOT Vmax:         109.00 cm/s LVOT Vmean:        72.200 cm/s LVOT VTI:          0.222 m LVOT/AV VTI ratio: 0.67  AORTA Ao Root diam: 3.90 cm Ao Asc diam:  4.25 cm MITRAL VALVE MV Area (PHT): 3.40 cm    SHUNTS MV Area VTI:   3.46 cm    Systemic VTI:  0.22 m MV Peak grad:  2.4 mmHg    Systemic Diam: 2.10 cm MV Mean grad:  1.0 mmHg MV Vmax:       0.78 m/s MV Vmean:      45.5 cm/s MV Decel Time: 223 msec MV E velocity: 80.70 cm/s MV A velocity: 48.20 cm/s MV E/A ratio:  1.67 Arvilla Meres MD Electronically signed by Arvilla Meres MD Signature Date/Time: 05/24/2023/12:45:28 PM    Final    MR THORACIC SPINE WO CONTRAST Result Date: 05/24/2023 CLINICAL DATA:  Infection EXAM: MRI THORACIC AND LUMBAR SPINE WITHOUT CONTRAST TECHNIQUE: Multiplanar and multiecho pulse  sequences of the thoracic and lumbar spine were obtained without intravenous contrast. COMPARISON:  01/18/2022 lumbar spine MRI FINDINGS: MRI THORACIC SPINE FINDINGS Alignment:  Physiologic. Vertebrae: No  fracture, evidence of discitis, or bone lesion. Cord:  Normal signal and morphology. Paraspinal and other soft tissues: Negative. Disc levels: T2-3: Small disc bulge without spinal canal stenosis. No other spinal canal or neural foraminal stenosis. MRI LUMBAR SPINE FINDINGS Segmentation:  Standard. Alignment:  Grade 1 retrolisthesis at L1-2, L2-3 and L3-4 Vertebrae:  No fracture, evidence of discitis, or bone lesion. Conus medullaris and cauda equina: Conus extends to the L1 level. Conus and cauda equina appear normal. Paraspinal and other soft tissues: Negative. Disc levels: L1-L2: Small disc bulge with endplate spurring. No spinal canal stenosis. Severe right and mild left neural foraminal stenosis. L2-L3: Small disc bulge. No spinal canal stenosis. No neural foraminal stenosis. L3-L4: Intermediate sized disc bulge with endplate spurring, unchanged. Moderate spinal canal stenosis. Mild right and severe left neural foraminal stenosis. L4-L5: Unchanged mild disc bulge with endplate spurring. Narrowing of both lateral recesses without central spinal canal stenosis. Moderate bilateral neural foraminal stenosis. L5-S1: Intermediate sized disc bulge with mild facet hypertrophy. Unchanged moderate spinal canal stenosis. Severe bilateral neural foraminal stenosis. Visualized sacrum: Normal. IMPRESSION: 1. No acute abnormality of the thoracic or lumbar spine. 2. Unchanged moderate spinal canal stenosis at L3-4 and L5-S1. 3. Unchanged severe right L1-2, left L3-4 and bilateral L5-S1 neural foraminal stenosis. 4. 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 Electronically Signed   By: Deatra Robinson M.D.   On: 05/24/2023 01:43   MR LUMBAR SPINE WO CONTRAST Result Date: 05/24/2023 CLINICAL DATA:  Infection  EXAM: MRI THORACIC AND LUMBAR SPINE WITHOUT CONTRAST TECHNIQUE: Multiplanar and multiecho pulse sequences of the thoracic and lumbar spine were obtained without intravenous contrast. COMPARISON:  01/18/2022 lumbar spine MRI FINDINGS: MRI THORACIC SPINE FINDINGS Alignment:  Physiologic. Vertebrae: No fracture, evidence of discitis, or bone lesion. Cord:  Normal signal and morphology. Paraspinal and other soft tissues: Negative. Disc levels: T2-3: Small disc bulge without spinal canal stenosis. No other spinal canal or neural foraminal stenosis. MRI LUMBAR SPINE FINDINGS Segmentation:  Standard. Alignment:  Grade 1 retrolisthesis at L1-2, L2-3 and L3-4 Vertebrae:  No fracture, evidence of discitis, or bone lesion. Conus medullaris and cauda equina: Conus extends to the L1 level. Conus and cauda equina appear normal. Paraspinal and other soft tissues: Negative. Disc levels: L1-L2: Small disc bulge with endplate spurring. No spinal canal stenosis. Severe right and mild left neural foraminal stenosis. L2-L3: Small disc bulge. No spinal canal stenosis. No neural foraminal stenosis. L3-L4: Intermediate sized disc bulge with endplate spurring, unchanged. Moderate spinal canal stenosis. Mild right and severe left neural foraminal stenosis. L4-L5: Unchanged mild disc bulge with endplate spurring. Narrowing of both lateral recesses without central spinal canal stenosis. Moderate bilateral neural foraminal stenosis. L5-S1: Intermediate sized disc bulge with mild facet hypertrophy. Unchanged moderate spinal canal stenosis. Severe bilateral neural foraminal stenosis. Visualized sacrum: Normal. IMPRESSION: 1. No acute abnormality of the thoracic or lumbar spine. 2. Unchanged moderate spinal canal stenosis at L3-4 and L5-S1. 3. Unchanged severe right L1-2, left L3-4 and bilateral L5-S1 neural foraminal stenosis. 4. 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 Electronically Signed   By: Deatra Robinson M.D.   On:  05/24/2023 01:43   US RENAL Result Date: 05/23/2023 CLINICAL DATA:  Acute kidney injury. EXAM: RENAL / URINARY TRACT ULTRASOUND COMPLETE COMPARISON:  None Available. FINDINGS: Right Kidney: Renal measurements: 8.2 cm x 5.3 cm x 4.5 cm = volume: 109.4 mL. Echogenicity within normal limits. A 17.7 mm shadowing echogenic renal calculus is seen within the right kidney. No  mass or hydronephrosis visualized. Left Kidney: Renal measurements: 11.8 cm x 4.1 cm x 4.7 cm = volume: 119 mL. Echogenicity within normal limits. An 8.9 mm shadowing echogenic renal calculus is seen within the left kidney. No mass or hydronephrosis visualized. Bladder: Echogenic material is seen within the bladder lumen. Other: It should be noted that the study is technically limited secondary to overlying bowel gas, as per the ultrasound technologist. Of incidental note is the presence of an enlarged spleen (17.4 cm in length). IMPRESSION: 1. Bilateral nonobstructing renal calculi. 2. Echogenic material within the urinary bladder which may represent sequelae associated with recent infection. Correlation with urinalysis is recommended. 3. Splenomegaly. Electronically Signed   By: Aram Candela M.D.   On: 05/23/2023 01:11   CT Renal Stone Study Result Date: 05/22/2023 CLINICAL DATA:  Abdominal/flank pain, stone suspected EXAM: CT ABDOMEN AND PELVIS WITHOUT CONTRAST TECHNIQUE: Multidetector CT imaging of the abdomen and pelvis was performed following the standard protocol without IV contrast. RADIATION DOSE REDUCTION: This exam was performed according to the departmental dose-optimization program which includes automated exposure control, adjustment of the mA and/or kV according to patient size and/or use of iterative reconstruction technique. COMPARISON:  11/20/2017 FINDINGS: Lower chest: Small left pleural effusion. Linear atelectasis in the left lower lobe. Hepatobiliary: Unremarkable unenhanced appearance of the liver. Clips in the  gallbladder fossa postcholecystectomy. No biliary dilatation. Pancreas: No ductal dilatation or inflammation. Spleen: The spleen is enlarged, 15.4 cm cranial caudal. Adrenals/Urinary Tract: No adrenal nodule. There are bilateral nonobstructing intrarenal calculi. No hydronephrosis. Both ureters are decompressed without ureteral stone. The urinary bladder is partially distended, no bladder stone or wall thickening. Stomach/Bowel: Small hiatal hernia. The stomach is nondistended. No small bowel obstruction or inflammatory change. Moderate diffuse colonic diverticulosis. Equivocal faint stranding about a diverticulum involving the distal descending colon series 6, image 81, may represent mild diverticulitis. Moderate volume of stool in the colon. Sigmoid colon is redundant. Vascular/Lymphatic: Aortic atherosclerosis. The aorta is tortuous but nonaneurysmal. No bulky abdominopelvic adenopathy. Reproductive: Enlarged prostate spans 5.9 cm transverse. Other: No ascites. No free air or focal fluid collection small bilateral inguinal hernias contain fat and a small amount of free fluid. Mild body wall edema. Musculoskeletal: The bones are subjectively under mineralized. Diffuse degenerative change throughout the lumbar spine with multilevel degenerative disc disease and facet hypertrophy. No fracture or acute osseous findings. IMPRESSION: 1. Bilateral nonobstructing intrarenal calculi. No ureteral stone or hydronephrosis. 2. Moderate diffuse colonic diverticulosis. Equivocal faint stranding about a diverticulum involving the distal descending colon, may represent mild diverticulitis. 3. Diffuse degenerative change throughout the spine without acute osseous findings. 4. Small left pleural effusion. 5. Chronic incidental findings include small hiatal hernia. Splenomegaly. Enlarged prostate. 6. Small bilateral inguinal hernias contain fat and a small amount of free fluid. Aortic Atherosclerosis (ICD10-I70.0). Electronically  Signed   By: Narda Rutherford M.D.   On: 05/22/2023 18:29    Microbiology: Results for orders placed or performed during the hospital encounter of 05/22/23  Urine Culture     Status: Abnormal   Collection Time: 05/22/23  8:40 PM   Specimen: Urine, Clean Catch  Result Value Ref Range Status   Specimen Description URINE, CLEAN CATCH  Final   Special Requests NONE Reflexed from [id number]  Final   Culture (A)  Final    >=100,000 COLONIES/mL STAPHYLOCOCCUS AUREUS 40,000 COLONIES/mL PSEUDOMONAS AERUGINOSA Two isolates with different morphologies were identified as the same organism.The most resistant organism was reported. Performed at [facility], [address] [place], [place] [id number]    Report Status 05/26/2023 FINAL  Final   Organism ID, Bacteria STAPHYLOCOCCUS AUREUS (A)  Final   Organism ID, Bacteria PSEUDOMONAS AERUGINOSA (A)  Final      Susceptibility   Pseudomonas aeruginosa - MIC*    CEFTAZIDIME 4 SENSITIVE Sensitive     CIPROFLOXACIN 0.5 SENSITIVE Sensitive     GENTAMICIN 4 SENSITIVE Sensitive     IMIPENEM 1 SENSITIVE Sensitive     PIP/TAZO 8 SENSITIVE Sensitive ug/mL    CEFEPIME 2 SENSITIVE Sensitive     * 40,000 COLONIES/mL PSEUDOMONAS AERUGINOSA   Staphylococcus aureus - MIC*    CIPROFLOXACIN <=0.5 SENSITIVE Sensitive     GENTAMICIN <=0.5 SENSITIVE Sensitive     NITROFURANTOIN <=16 SENSITIVE Sensitive     OXACILLIN 0.5 SENSITIVE Sensitive     TETRACYCLINE <=1 SENSITIVE Sensitive     VANCOMYCIN 1 SENSITIVE Sensitive     TRIMETH/SULFA <=10 SENSITIVE Sensitive     RIFAMPIN <=0.5 SENSITIVE Sensitive     Inducible Clindamycin NEGATIVE Sensitive     LINEZOLID 2 SENSITIVE Sensitive     * >=100,000 COLONIES/mL STAPHYLOCOCCUS AUREUS  Resp panel by RT-PCR (RSV, Flu A&B, Covid) Anterior Nasal Swab     Status: None   Collection Time: 05/22/23  8:41 PM   Specimen: Anterior Nasal Swab  Result Value Ref Range Status   SARS Coronavirus 2 by RT PCR NEGATIVE NEGATIVE Final    Influenza A by PCR NEGATIVE NEGATIVE Final   Influenza B by PCR NEGATIVE NEGATIVE Final    Comment: (NOTE) The Xpert Xpress SARS-CoV-2/FLU/RSV plus assay is intended as an aid in the diagnosis of influenza from Nasopharyngeal swab specimens and should not be used as a sole basis for treatment. Nasal washings and aspirates are unacceptable for Xpert Xpress SARS-CoV-2/FLU/RSV testing.  Fact Sheet for Patients: BloggerCourse.com  Fact Sheet for Healthcare Providers: SeriousBroker.it  This test is not yet approved or cleared by the [place] FDA and has been authorized for detection and/or diagnosis of SARS-CoV-2 by FDA under an Emergency Use Authorization (EUA). This EUA will remain in effect (meaning this test can be used) for the duration of the COVID-19 declaration under Section 564(b)(1) of the Act, 21 U.S.C. section 360bbb-3(b)(1), unless the authorization is terminated or revoked.     Resp Syncytial Virus by PCR NEGATIVE NEGATIVE Final    Comment: (NOTE) Fact Sheet for Patients: BloggerCourse.com  Fact Sheet for Healthcare Providers: SeriousBroker.it  This test is not yet approved or cleared by the [place] FDA and has been authorized for detection and/or diagnosis of SARS-CoV-2 by FDA under an Emergency Use Authorization (EUA). This EUA will remain in effect (meaning this test can be used) for the duration of the COVID-19 declaration under Section 564(b)(1) of the Act, 21 U.S.C. section 360bbb-3(b)(1), unless the authorization is terminated or revoked.  Performed at [facility], [address], [place], [place] [address]   Blood culture (routine x 2)     Status: Abnormal   Collection Time: 05/22/23  9:46 PM   Specimen: BLOOD  Result Value Ref Range Status   Specimen Description BLOOD LEFT ANTECUBITAL  Final   Special Requests   Final    BOTTLES  DRAWN AEROBIC AND ANAEROBIC Blood Culture adequate volume   Culture  Setup Time   Final    GRAM POSITIVE COCCI IN CLUSTERS IN BOTH AEROBIC AND ANAEROBIC BOTTLES CRITICAL RESULT CALLED TO, READ BACK BY AND VERIFIED WITH: E.  SINCLAIR PHARMD, AT 1250 05/23/23 D. VANHOOK Performed at [facility], [address], [place], [place] [address]    Culture STAPHYLOCOCCUS AUREUS (A)  Final   Report Status 05/25/2023 FINAL  Final   Organism ID, Bacteria STAPHYLOCOCCUS AUREUS  Final      Susceptibility   Staphylococcus aureus - MIC*    CIPROFLOXACIN <=0.5 SENSITIVE Sensitive     ERYTHROMYCIN <=0.25 SENSITIVE Sensitive     GENTAMICIN <=0.5 SENSITIVE Sensitive     OXACILLIN 0.5 SENSITIVE Sensitive     TETRACYCLINE <=1 SENSITIVE Sensitive     VANCOMYCIN 1 SENSITIVE Sensitive     TRIMETH/SULFA <=10 SENSITIVE Sensitive     CLINDAMYCIN <=0.25 SENSITIVE Sensitive     RIFAMPIN <=0.5 SENSITIVE Sensitive     Inducible Clindamycin NEGATIVE Sensitive     LINEZOLID 2 SENSITIVE Sensitive     * STAPHYLOCOCCUS AUREUS  Blood Culture ID Panel (Reflexed)     Status: Abnormal   Collection Time: 05/22/23  9:46 PM  Result Value Ref Range Status   Enterococcus faecalis NOT DETECTED NOT DETECTED Final   Enterococcus Faecium NOT DETECTED NOT DETECTED Final   Listeria monocytogenes NOT DETECTED NOT DETECTED Final   Staphylococcus species DETECTED (A) NOT DETECTED Final    Comment: CRITICAL RESULT CALLED TO, READ BACK BY AND VERIFIED WITH: E. SINCLAIR PHARMD, AT 1250 05/23/23 D. VANHOOK    Staphylococcus aureus (BCID) DETECTED (A) NOT DETECTED Final    Comment: CRITICAL RESULT CALLED TO, READ BACK BY AND VERIFIED WITH: E. SINCLAIR PHARMD, AT 1250 05/23/23 D. VANHOOK    Staphylococcus epidermidis NOT DETECTED NOT DETECTED Final   Staphylococcus lugdunensis NOT DETECTED NOT DETECTED Final   Streptococcus species NOT DETECTED NOT DETECTED Final   Streptococcus agalactiae NOT DETECTED NOT DETECTED Final    Streptococcus pneumoniae NOT DETECTED NOT DETECTED Final   Streptococcus pyogenes NOT DETECTED NOT DETECTED Final   A.calcoaceticus-baumannii NOT DETECTED NOT DETECTED Final   Bacteroides fragilis NOT DETECTED NOT DETECTED Final   Enterobacterales NOT DETECTED NOT DETECTED Final   Enterobacter cloacae complex NOT DETECTED NOT DETECTED Final   Escherichia coli NOT DETECTED NOT DETECTED Final   Klebsiella aerogenes NOT DETECTED NOT DETECTED Final   Klebsiella oxytoca NOT DETECTED NOT DETECTED Final   Klebsiella pneumoniae NOT DETECTED NOT DETECTED Final   Proteus species NOT DETECTED NOT DETECTED Final   Salmonella species NOT DETECTED NOT DETECTED Final   Serratia marcescens NOT DETECTED NOT DETECTED Final   Haemophilus influenzae NOT DETECTED NOT DETECTED Final   Neisseria meningitidis NOT DETECTED NOT DETECTED Final   Pseudomonas aeruginosa NOT DETECTED NOT DETECTED Final   Stenotrophomonas maltophilia NOT DETECTED NOT DETECTED Final   Candida albicans NOT DETECTED NOT DETECTED Final   Candida auris NOT DETECTED NOT DETECTED Final   Candida glabrata NOT DETECTED NOT DETECTED Final   Candida krusei NOT DETECTED NOT DETECTED Final   Candida parapsilosis NOT DETECTED NOT DETECTED Final   Candida tropicalis NOT DETECTED NOT DETECTED Final   Cryptococcus neoformans/gattii NOT DETECTED NOT DETECTED Final   Meth resistant mecA/C and MREJ NOT DETECTED NOT DETECTED Final    Comment: Performed at [facility], [address] [place], [place] [address]  Blood culture (routine x 2)     Status: None   Collection Time: 05/23/23 12:26 AM   Specimen: BLOOD  Result Value Ref Range Status   Specimen Description BLOOD BLOOD RIGHT ARM  Final   Special Requests   Final  BOTTLES DRAWN AEROBIC AND ANAEROBIC Blood Culture adequate volume   Culture   Final    NO GROWTH 5 DAYS Performed at [facility], [address] [place], [place] [address]    Report Status 05/28/2023 FINAL  Final   Culture, blood (Routine X 2) w Reflex to ID Panel     Status: None (Preliminary result)   Collection Time: 05/25/23 12:36 PM   Specimen: BLOOD RIGHT HAND  Result Value Ref Range Status   Specimen Description BLOOD RIGHT HAND  Final   Special Requests   Final    BOTTLES DRAWN AEROBIC AND ANAEROBIC Blood Culture adequate volume   Culture   Final    NO GROWTH 4 DAYS Performed at [facility], [address], [place], [place] [address]    Report Status PENDING  Incomplete  Culture, blood (Routine X 2) w Reflex to ID Panel     Status: None (Preliminary result)   Collection Time: 05/25/23 12:36 PM   Specimen: BLOOD RIGHT ARM  Result Value Ref Range Status   Specimen Description BLOOD RIGHT ARM  Final   Special Requests   Final    BOTTLES DRAWN AEROBIC AND ANAEROBIC Blood Culture adequate volume   Culture   Final    NO GROWTH 4 DAYS Performed at [facility], [address], [place], [place] [address]    Report Status PENDING  Incomplete    Labs: CBC: Recent Labs  Lab 05/25/23 0530 05/25/23 1236 05/26/23 0504 05/27/23 0714 05/28/23 0418 05/29/23 0651  WBC 5.6  --  5.5 5.8 6.3 5.2  NEUTROABS 4.6  --  4.2 3.9 3.9 2.7  HGB 8.5*  --  7.6* 7.8* 8.2* 8.8*  HCT 25.5*  --  22.6* 23.6* 24.2* 26.7*  MCV 101.6*  --  100.0 100.9* 100.8* 101.5*  PLT 21* 21* 18* 25* 23* 22*   Basic Metabolic Panel: Recent Labs  Lab 05/25/23 0530 05/26/23 0504 05/27/23 0714 05/28/23 0418 05/29/23 0651  NA 138 133* 134* 134* 131*  K 3.9 3.9 4.2 4.3 3.9  CL 109 106 107 103 103  CO2 22 22 23 22 23   GLUCOSE 102* 95 100* 101* 93  BUN 37* 39* 35* 32* 27*  CREATININE 1.03 1.12 1.11 0.99 0.86  CALCIUM 8.4* 8.1* 8.2* 8.2* 8.2*   Liver Function Tests: No results for input(s): "AST", "ALT", "ALKPHOS", "BILITOT", "PROT", "ALBUMIN" in the last 168 hours. CBG: No results for input(s): "GLUCAP" in the last 168 hours.  Discharge time spent: greater than 30 minutes.  Signed: Jonah Blue,  MD [facility] 05/29/2023

## 2023-05-29 NOTE — Progress Notes (Signed)
 Peripherally Inserted Central Catheter Placement  The IV Nurse has discussed with the patient and/or persons authorized to consent for the patient, the purpose of this procedure and the potential benefits and risks involved with this procedure.  The benefits include less needle sticks, lab draws from the catheter, and the patient may be discharged home with the catheter. Risks include, but not limited to, infection, bleeding, blood clot (thrombus formation), and puncture of an artery; nerve damage and irregular heartbeat and possibility to perform a PICC exchange if needed/ordered by physician.  Alternatives to this procedure were also discussed.  Bard Power PICC patient education guide, fact sheet on infection prevention and patient information card has been provided to patient /or left at bedside.    PICC Placement Documentation  PICC Single Lumen 05/29/23 Right Basilic 40 cm 0 cm (Active)  Indication for Insertion or Continuance of Line Prolonged intravenous therapies 05/29/23 1437  Exposed Catheter (cm) 0 cm 05/29/23 1437  Site Assessment Clean, Dry, Intact 05/29/23 1437  Line Status Flushed;Blood return noted;Saline locked 05/29/23 1437  Dressing Type Transparent 05/29/23 1437  Dressing Status Antimicrobial disc/dressing in place 05/29/23 1437  Line Care Connections checked and tightened 05/29/23 1437  Line Adjustment (NICU/IV Team Only) No 05/29/23 1437  Dressing Intervention New dressing 05/29/23 1437  Dressing Change Due 06/05/23 05/29/23 1437       Audrie Gallus 05/29/2023, 2:38 PM

## 2023-05-29 NOTE — Progress Notes (Signed)
 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 - Patient presented with fever no leukocytosis.  On record review it was noted that he had positive urine cultures with MSSA on 2/27 for which she received Macrobid x 10 days. - CT renal study showed bilateral nonobstructing intrarenal calculi, possible diverticulitis, small left pleural effusion, small bilateral inguinal hernias. - Ultrasound renal showed echogenic material in the urinary bladder which could be's sequela of recent infection. - Blood cultures from admission grew MSSA.  Urine cultures growing 100 K colonies Staph aureus, GNR 40K - I spoke to patient and wife.  Wife states that he bruises easily.  No recent episodes of rash, open cuts or wounds.  No recent dental procedures, -MRI thoracic and lumbar spine no infection Recommendations:  -Continue cefazolin - Repeat blood cultures NG so far form 3/13 - TEE no beg. TEE ordered but not a candidate as Plt 22(cut off 50k per cards). Plt stil l 22 after IVIG On 3/13 and plt x2U. As such will plan on 6 weeks of cefazolin from negative cx for empiric IE coverage - CT max facial unrevealing  -Place PICC  #MSSA and PsA in urine -Pseudomanias in urine is likely contaminant but given echogenic material in  bladder and MSSA represtative of overflow --PsA in urine SP lqn x 3days  ID will SO  OPAT ORDERS:  Diagnosis: MSSA bacteremia  Culture Result: 1/2 sets MSSA  Allergies  Allergen Reactions   Nsaids Other (See Comments)    JAUNDICE   Celecoxib Nausea And Vomiting   Meperidine Hcl Nausea Only     Discharge antibiotics to be given via PICC line:  Per pharmacy protocol cefazolin   Duration: 6 week End Date: 4/24  [facility] Care Per Protocol with [employer]op[employer] Use: Home health RN for IV administration and teaching, line care and labs.    Labs weekly while on IV antibiotics: _x_ CBC with differential __ BMP **TWICE WEEKLY ON VANCOMYCIN  _x_ CMP _xx_ CRP __ ESR __ Vancomycin trough TWICE WEEKLY __ CK  __x Please pull PIC at completion of IV antibiotics __ Please leave PIC in place until doctor has seen patient or been notified  Fax weekly labs to [phone]  Clinic Follow Up Appt: 4/7  @ [facility] with Thedore Mins   Evaluation of this patient requires complex antimicrobial therapy evaluation and counseling + isolation needs for disease transmission risk assessment and mitigation   Microbiology:   Antibiotics: Ctx 3/10 Cefazolin 3/11- Cultures: Blood 3/10 MSSA 3/13 ng Urine 3/10 MSSA and gram-negative rods 2/27 MSSA   SUBJECTIVE: Laying in bed. Back pain still there but somewhat imptorved.  Interval: Afebrile overnight:   Review of Systems: Review of Systems  All other systems reviewed and are negative.    Scheduled Meds:  acetaminophen  1,000 mg Oral TID  docusate sodium  100 mg Oral BID   gabapentin  100 mg Oral QHS   levothyroxine  150 mcg Oral QAC breakfast   lidocaine  1-3 patch Transdermal Q24H   methocarbamol  500 mg Oral QID   polyethylene glycol  17 g Oral Daily   predniSONE  20 mg Oral Q breakfast   Continuous Infusions:   ceFAZolin (ANCEF) IV 2 g (05/29/23 0506)   PRN Meds:.Muscle Rub, naphazoline-glycerin, oxyCODONE Allergies  Allergen  Reactions   Nsaids Other (See Comments)    JAUNDICE   Celecoxib Nausea And Vomiting   Meperidine Hcl Nausea Only    OBJECTIVE: Vitals:   05/28/23 1546 05/28/23 2024 05/29/23 0512 05/29/23 0729  BP: 139/62 (!) 137/58 (!) 194/80 (!) 150/58  Pulse: 64 60 65 71  Resp: 16 20 19 18   Temp: 97.8 F (36.6 C) 98 F (36.7 C) 97.6 F (36.4 C) 98 F (36.7 C)  TempSrc:      SpO2: 97% 98% 97% 98%  Weight:      Height:       Body mass index is 25.16 kg/m.  Physical Exam Constitutional:      General: He is not in acute distress.    Appearance: He is normal weight. He is not toxic-appearing.  HENT:     Head: Normocephalic and atraumatic.     Right Ear: External ear normal.     Left Ear: External ear normal.     Nose: No congestion or rhinorrhea.     Mouth/Throat:     Mouth: Mucous membranes are moist.     Pharynx: Oropharynx is clear.  Eyes:     Extraocular Movements: Extraocular movements intact.     Conjunctiva/sclera: Conjunctivae normal.     Pupils: Pupils are equal, round, and reactive to light.  Cardiovascular:     Rate and Rhythm: Normal rate and regular rhythm.     Heart sounds: No murmur heard.    No friction rub. No gallop.  Pulmonary:     Effort: Pulmonary effort is normal.     Breath sounds: Normal breath sounds.  Abdominal:     General: Abdomen is flat. Bowel sounds are normal.     Palpations: Abdomen is soft.  Musculoskeletal:        General: No swelling. Normal range of motion.     Cervical back: Normal range of motion and neck supple.  Skin:    General: Skin is warm and dry.  Neurological:     General: No focal deficit present.  Psychiatric:        Mood and Affect: Mood normal.       Lab Results Lab Results  Component Value Date   WBC 5.2 05/29/2023   HGB 8.8 (L) 05/29/2023   HCT 26.7 (L) 05/29/2023   MCV 101.5 (H) 05/29/2023   PLT 22 (LL) 05/29/2023    Lab Results  Component Value Date   CREATININE 0.86 05/29/2023   BUN 27 (H) 05/29/2023    NA 131 (L) 05/29/2023   K 3.9 05/29/2023   CL 103 05/29/2023   CO2 23 05/29/2023    Lab Results  Component Value Date   ALT 36 05/11/2023   AST 29 05/11/2023   ALKPHOS 179 (H) 05/11/2023   BILITOT 1.2 05/11/2023        Danelle Earthly, MD [facility] 05/29/2023, 12:48 PM

## 2023-05-29 NOTE — Progress Notes (Signed)
 Patient ID: Samuel Little, male   DOB: Mar 14, 1933, 89 y.o.   MRN: [id number]    Progress Note from the Palliative Medicine Team at [facility]   Patient Name: Samuel Little        Date: 05/29/2023 DOB: 1932/12/20  Age: 89 y.o. MRN#: [id number] Attending Physician: Samuel Blue, MD Primary Care Physician: Samuel Housekeeper, MD Admit Date: 05/22/2023   Reason for Consultation/Follow-up   Establishing Goals of Care   HPI/ Brief Hospital Review  89yo with h/o HTN, AVNRT s/p ablation, hypothyroidism, pancytopenia, and lumbar stenosis with neurogenic claudication who presented on 3/10 with LBP and hematuria. He was recently treated for MSSA UTI with 10 days of Macrobid. CT with B nonobstructing intrarenal calculi without hydronephrosis. He was found to have MSSA UTI with bacteremia and was started on Ceftriaxone.  Patient and family face ongoing decisions regarding treatment options, advanced directives and anticipatory care needs.  Subjective  Extensive chart review has been completed prior to meeting with patient/family  including labs, vital signs, imaging, progress/consult notes, orders, medications and available advance directive documents.   Discussed in detail with attending Dr. Jonah Little   This NP assessed patient at the bedside as a follow up for palliative medicine needs and emotional support.  Patient is being repositioned in bed by nursing having just use the bedside commode.  Patient is frail, mobility limited secondary to generalized weakness and complaint of overall discomfort.      Plan today is for PICC line placement and transition to SNF for ongoing rehabilitation.  I was able to speak to patient's daughter/Samuel Little by telephone.  Education offered on patient not being a good candidate for interventional interventions for back pain at this time secondary to decreased platelet count.  Encouraged ongoing outpatient follow-up  She had questions regarding outpatient  palliative services.  Education offered on the difference between [facility] inpatient Palliative Medicine Team and outpatient palliative services offered at skilled nursing facility.  Education offered on outpatient services through both [facility].  I shared my concern regarding patient's high risk for decompensation and the  importance of continued conversation within family and with the   medical providers regarding overall plan of care and treatment options,  ensuring decisions are within the context of the patients values and GOCs.  Questions and concerns addressed   Discussed with primary team and nursing staff   Time: 50   minutes  Detailed review of medical records ( labs, imaging, vital signs), medically appropriate exam ( MS, skin, cardiac,  resp)   discussed with treatment team, counseling and education to patient, family, staff, documenting clinical information, medication management, coordination of care    Lorinda Creed NP  Palliative Medicine Team Team Phone # [phone] Pager [phone]

## 2023-05-29 NOTE — Progress Notes (Signed)
 PHARMACY CONSULT NOTE FOR:  OUTPATIENT  PARENTERAL ANTIBIOTIC THERAPY (OPAT)  Indication: MSSA bacteremia Regimen: Cefazolin 2 gm IV Q 8 hours  End date: 07/06/23  IV antibiotic discharge orders are pended. To discharging provider:  please sign these orders via discharge navigator,  Select New Orders & click on the button choice - Manage This Unsigned Work.     Thank you for allowing pharmacy to be a part of this patient's care.  Sharin Mons, PharmD, BCPS, BCIDP Infectious Diseases Clinical Pharmacist Phone: [phone] 05/29/2023, 12:04 PM

## 2023-05-29 NOTE — TOC Transition Note (Signed)
 Transition of Care [facility]) - Discharge Note   Patient Details  Name: Samuel Little MRN: [id number] Date of Birth: [birth year]  [facility]) CM/SW Contact:  Marliss Coots, LCSW Phone Number: 05/29/2023, 1:07 PM   Clinical Narrative:     Patient will DC to: [facility] Anticipated DC date: 05/29/2023 Family notified: Chanc Kervin; Spouse; [phone] Transport by: Sharin Mons   Per MD patient ready for DC to [facility]. RN to call report prior to discharge [phone]). RN, patient (informed by hospitalist), patient's family, and facility notified of DC. Discharge Summary and FL2 sent to facility. DC packet on chart. Ambulance transport requested for patient.   CSW will sign off for now as social work intervention is no longer needed. Please consult [place] again if new needs arise.     Final next level of care: Skilled Nursing Facility Barriers to Discharge: Barriers Resolved   Patient Goals and CMS Choice Patient states their goals for this hospitalization and ongoing recovery are:: SNF CMS Medicare.gov Compare Post Acute Care list provided to:: Patient Choice offered to / list presented to : Patient      Discharge Placement              Patient chooses bed at: [facility] Patient to be transferred to facility by: [facility] Name of family member notified: Dorris Carnes; Spouse; [phone] Patient and family notified of of transfer: 05/29/23  Discharge Plan and Services Additional resources added to the After Visit Summary for   In-house Referral: Clinical Social Work   Post Acute Care Choice: Skilled Nursing Facility                               Social Drivers of Health (SDOH) Interventions SDOH Screenings   Food Insecurity: No Food Insecurity (05/22/2023)  Housing: Low Risk  (05/22/2023)  Transportation Needs: No Transportation Needs (05/22/2023)  Utilities: Not At Risk (05/22/2023)  Social Connections: Moderately Integrated  (05/23/2023)  Tobacco Use: Low Risk  (05/22/2023)     Readmission Risk Interventions     No data to display

## 2023-05-30 LAB — CULTURE, BLOOD (ROUTINE X 2)
Culture: NO GROWTH
Culture: NO GROWTH
Special Requests: ADEQUATE
Special Requests: ADEQUATE

## 2023-05-30 LAB — PATHOLOGIST SMEAR REVIEW

## 2023-05-31 ENCOUNTER — Encounter (HOSPITAL_COMMUNITY): Payer: Self-pay

## 2023-05-31 ENCOUNTER — Emergency Department (HOSPITAL_COMMUNITY)
Admission: EM | Admit: 2023-05-31 | Discharge: 2023-05-31 | Disposition: A | Discharge status: Home / Self Care | Attending: Emergency Medicine | Admitting: Emergency Medicine

## 2023-05-31 ENCOUNTER — Other Ambulatory Visit: Payer: Self-pay

## 2023-05-31 DIAGNOSIS — Z79899 Other long term (current) drug therapy: Secondary | ICD-10-CM | POA: Diagnosis not present

## 2023-05-31 DIAGNOSIS — D61818 Other pancytopenia: Secondary | ICD-10-CM | POA: Diagnosis not present

## 2023-05-31 DIAGNOSIS — E871 Hypo-osmolality and hyponatremia: Secondary | ICD-10-CM | POA: Insufficient documentation

## 2023-05-31 DIAGNOSIS — R600 Localized edema: Secondary | ICD-10-CM | POA: Insufficient documentation

## 2023-05-31 DIAGNOSIS — I10 Essential (primary) hypertension: Secondary | ICD-10-CM | POA: Diagnosis not present

## 2023-05-31 DIAGNOSIS — F0390 Unspecified dementia without behavioral disturbance: Secondary | ICD-10-CM | POA: Diagnosis not present

## 2023-05-31 DIAGNOSIS — M7981 Nontraumatic hematoma of soft tissue: Secondary | ICD-10-CM | POA: Diagnosis not present

## 2023-05-31 DIAGNOSIS — R799 Abnormal finding of blood chemistry, unspecified: Secondary | ICD-10-CM | POA: Diagnosis present

## 2023-05-31 LAB — BASIC METABOLIC PANEL
Anion gap: 4 — ABNORMAL LOW (ref 5–15)
BUN: 34 mg/dL — ABNORMAL HIGH (ref 8–23)
CO2: 22 mmol/L (ref 22–32)
Calcium: 8 mg/dL — ABNORMAL LOW (ref 8.9–10.3)
Chloride: 103 mmol/L (ref 98–111)
Creatinine, Ser: 0.82 mg/dL (ref 0.61–1.24)
GFR, Estimated: >60 mL/min (ref >60)
Glucose, Bld: 108 mg/dL — ABNORMAL HIGH (ref 70–99)
Potassium: 4.2 mmol/L (ref 3.5–5.1)
Sodium: 129 mmol/L — ABNORMAL LOW (ref 135–145)

## 2023-05-31 LAB — TYPE AND SCREEN
ABO/RH(D): O POS
Antibody Screen: NEGATIVE

## 2023-05-31 LAB — CBC WITH DIFFERENTIAL/PLATELET
Abs Immature Granulocytes: 0.1 10*3/uL — ABNORMAL HIGH (ref 0.00–0.07)
Basophils Absolute: 0 10*3/uL (ref 0.0–0.1)
Basophils Relative: 0 %
Eosinophils Absolute: 0 10*3/uL (ref 0.0–0.5)
Eosinophils Relative: 0 %
HCT: 23.6 % — ABNORMAL LOW (ref 39.0–52.0)
Hemoglobin: 7.6 g/dL — ABNORMAL LOW (ref 13.0–17.0)
Lymphocytes Relative: 42 %
Lymphs Abs: 1.8 10*3/uL (ref 0.7–4.0)
MCH: 33.8 pg (ref 26.0–34.0)
MCHC: 32.2 g/dL (ref 30.0–36.0)
MCV: 104.9 fL — ABNORMAL HIGH (ref 80.0–100.0)
Monocytes Absolute: 0.3 10*3/uL (ref 0.1–1.0)
Monocytes Relative: 6 %
Myelocytes: 2 %
Neutro Abs: 2.1 10*3/uL (ref 1.7–7.7)
Neutrophils Relative %: 50 %
Platelets: 21 10*3/uL — CL (ref 150–400)
RBC: 2.25 MIL/uL — ABNORMAL LOW (ref 4.22–5.81)
RDW: 22.2 % — ABNORMAL HIGH (ref 11.5–15.5)
WBC: 4.2 10*3/uL (ref 4.0–10.5)
nRBC: 2.9 % — ABNORMAL HIGH (ref 0.0–0.2)

## 2023-05-31 LAB — VITAMIN B12: Vitamin B-12: 1782 pg/mL — ABNORMAL HIGH (ref 180–914)

## 2023-05-31 LAB — RETICULOCYTES
Immature Retic Fract: 31 % — ABNORMAL HIGH (ref 2.3–15.9)
RBC.: 2.21 MIL/uL — ABNORMAL LOW (ref 4.22–5.81)
Retic Count, Absolute: 81.1 10*3/uL (ref 19.0–186.0)
Retic Ct Pct: 3.7 % — ABNORMAL HIGH (ref 0.4–3.1)

## 2023-05-31 LAB — IRON AND TIBC
Iron: 39 ug/dL — ABNORMAL LOW (ref 45–182)
Saturation Ratios: 21 % (ref 17.9–39.5)
TIBC: 188 ug/dL — ABNORMAL LOW (ref 250–450)
UIBC: 149 ug/dL

## 2023-05-31 LAB — FERRITIN: Ferritin: 583 ng/mL — ABNORMAL HIGH (ref 24–336)

## 2023-05-31 LAB — FOLATE: Folate: 11.9 ng/mL (ref >5.9)

## 2023-05-31 MED ORDER — OXYCODONE-ACETAMINOPHEN 5-325 MG PO TABS
2.0000 | ORAL_TABLET | Freq: Once | ORAL | Status: AC
  Administered 2023-05-31: 2 via ORAL
  Filled 2023-05-31: qty 2

## 2023-05-31 NOTE — ED Triage Notes (Signed)
 Pt BIB EMS from [facility] due to abnormal labs. Pt has a Hemoglobin of 8 and Platelet count of 11. Pt is Hard of Hearing; wears bilateral hearing aids. Hx of MRSA; pt on Antibiotics as treatment. Hx of fall. PICC line in Right upper arm. Pt c/o lower back pain and left leg pain that has been persisting for two weeks now.

## 2023-05-31 NOTE — ED Provider Notes (Signed)
 [place] EMERGENCY DEPARTMENT AT [facility] Provider Note   CSN: [id number] Arrival date & time: 05/31/23  1751     History  Chief Complaint  Patient presents with   Abnormal labs    Samuel Little is a 89 y.o. male.  The history is provided by the patient, the spouse and medical records. No language interpreter was used.     89 year old male history of dementia, gout, hypertension, thyroid disease, hard of hearing, anemia brought here via EMS from [facility] nursing facility with concerns of abnormal labs history obtained mostly through wife who is at bedside.  Patient is hard of hearing.  For the past week patient has had progressive worsening back pain, not feeling well, having blood in his urine and was previously treated for MSSA UTI with a 10-day course of Macrobid.  Patient was recently hospitalized for 7 days due to having blood in his urine and bilateral flank pain and was treated with antibiotics for blood culture that grown MSSA.  Patient receiving antibiotic medication through a PICC line and an was discharged to a rehab facility 2 days ago.  During his last hospitalization he was found to have low platelets that was thought to be drug-induced.  Oncology Dr. Arbutus Ped was involved and patient was transfused for platelets.  He has his blood work done today and he was told that his platelet was 11 thus prompting this ER visit.  Home Medications Prior to Admission medications   Medication Sig Start Date End Date Taking? Authorizing Provider  acetaminophen (TYLENOL) 650 MG CR tablet Take 1,300 mg every 8 (eight) hours as needed by mouth for pain.     [provider]  ceFAZolin (ANCEF) IVPB Inject 2 g into the vein every 8 (eight) hours. Indication:  MSSA Bacteremia First Dose: Yes Last Day of Therapy:  07/06/23 Labs - Once weekly:  CBC/D and BMP, Labs - Once weekly: ESR and CRP 05/29/23 07/06/23  Jonah Blue, MD  docusate sodium (COLACE) 100 MG capsule  Take 1 capsule (100 mg total) by mouth 2 (two) times daily. 05/29/23   Jonah Blue, MD  doxazosin (CARDURA) 4 MG tablet Take 4 mg by mouth at bedtime.    [provider]  gabapentin (NEURONTIN) 100 MG capsule Take 1 capsule (100 mg total) by mouth at bedtime. 05/29/23   Jonah Blue, MD  levothyroxine (SYNTHROID) 150 MCG tablet Take 150 mcg by mouth daily before breakfast.    [provider]  lidocaine (LIDODERM) 5 % Place 1-3 patches onto the skin daily. Remove & Discard patch within 12 hours or as directed by MD 05/30/23   Jonah Blue, MD  lisinopril (ZESTRIL) 30 MG tablet Take 30 mg by mouth daily.    [provider]  Menthol-Methyl Salicylate (MUSCLE RUB) 10-15 % CREA Apply 1 Application topically as needed for muscle pain. 05/29/23   Jonah Blue, MD  methocarbamol (ROBAXIN) 500 MG tablet Take 1 tablet (500 mg total) by mouth 4 (four) times daily. 05/29/23   Jonah Blue, MD  naphazoline-glycerin (CLEAR EYES REDNESS) 0.012-0.25 % SOLN Place 1-2 drops into both eyes 4 (four) times daily as needed for eye irritation. 05/29/23   Jonah Blue, MD  oxyCODONE (OXY IR/ROXICODONE) 5 MG immediate release tablet Take 1 tablet (5 mg total) by mouth every 4 (four) hours as needed for severe pain (pain score 7-10) or moderate pain (pain score 4-6). 05/29/23   Jonah Blue, MD  polyethylene glycol (MIRALAX / GLYCOLAX) 17 g  packet Take 17 g by mouth daily. 05/30/23   Jonah Blue, MD  predniSONE (DELTASONE) 10 MG tablet Take 2 tablets (20 mg total) by mouth daily with breakfast. Take 2 tablets daily x 2 weeks 05/05/23   Heilingoetter, Cassandra L, PA-C      Allergies    Nsaids, Celecoxib, and Meperidine hcl    Review of Systems   Review of Systems  All other systems reviewed and are negative.   Physical Exam Updated Vital Signs BP (!) 142/59   Pulse 65   Temp 97.9 F (36.6 C) (Oral)   Resp 20   SpO2 98%  Physical Exam Constitutional:      General: He  is not in acute distress.    Appearance: He is well-developed.     Comments: Chronically ill appearing male laying bed eyes closed resting in no acute discomfort.  Patient is arousable but is hard of hearing.  HENT:     Head: Atraumatic.     Ears:     Comments: Wearing hearing aids Eyes:     Conjunctiva/sclera: Conjunctivae normal.  Cardiovascular:     Rate and Rhythm: Normal rate and regular rhythm.     Pulses: Normal pulses.     Heart sounds: Normal heart sounds.  Pulmonary:     Effort: Pulmonary effort is normal.     Breath sounds: Normal breath sounds.  Abdominal:     Palpations: Abdomen is soft.  Musculoskeletal:        General: Swelling (Edema noted in all 4 extremities) present.     Cervical back: Normal range of motion and neck supple.  Skin:    Findings: No rash.     Comments: Bruising noted to left upper arm from prior IV lab draws  PICC line noted in right upper arm  Neurological:     Mental Status: He is alert. Mental status is at baseline.     ED Results / Procedures / Treatments   Labs (all labs ordered are listed, but only abnormal results are displayed) Labs Reviewed  BASIC METABOLIC PANEL - Abnormal; Notable for the following components:      Result Value   Sodium 129 (*)    Glucose, Bld 108 (*)    BUN 34 (*)    Calcium 8.0 (*)    Anion gap 4 (*)    All other components within normal limits  CBC WITH DIFFERENTIAL/PLATELET - Abnormal; Notable for the following components:   RBC 2.25 (*)    Hemoglobin 7.6 (*)    HCT 23.6 (*)    MCV 104.9 (*)    RDW 22.2 (*)    Platelets 21 (*)    nRBC 2.9 (*)    Abs Immature Granulocytes 0.10 (*)    All other components within normal limits  RETICULOCYTES - Abnormal; Notable for the following components:   Retic Ct Pct 3.7 (*)    RBC. 2.21 (*)    Immature Retic Fract 31.0 (*)    All other components within normal limits  VITAMIN B12  FOLATE  IRON AND TIBC  FERRITIN  TYPE AND SCREEN     EKG None  Radiology No results found.  Procedures Procedures    Medications Ordered in ED Medications  oxyCODONE-acetaminophen (PERCOCET/ROXICET) 5-325 MG per tablet 2 tablet (has no administration in time range)    ED Course/ Medical Decision Making/ A&P  Medical Decision Making Amount and/or Complexity of Data Reviewed Labs: ordered. ECG/medicine tests: ordered.   BP (!) 150/71   Pulse 66   Temp 97.9 F (36.6 C) (Oral)   Resp 20   SpO2 98%   66:79 PM 89 year old male history of dementia, gout, hypertension, thyroid disease, hard of hearing, anemia brought here via EMS from [facility] nursing facility with concerns of abnormal labs history obtained mostly through wife who is at bedside.  Patient is hard of hearing.  For the past week patient has had progressive worsening back pain, not feeling well, having blood in his urine and was previously treated for MSSA UTI with a 10-day course of Macrobid.  Patient was recently hospitalized for 7 days due to having blood in his urine and bilateral flank pain and was treated with antibiotics for blood culture that grown MSSA.  Patient receiving antibiotic medication through a PICC line and an was discharged to a rehab facility 2 days ago.  During his last hospitalization he was found to have low platelets that was thought to be drug-induced.  Oncology Dr. Arbutus Ped was involved and patient was transfused for platelets.  He has his blood work done today and he was told that his platelet was 11 thus prompting this ER visit.  Exam notable for an elderly male, ill-appearing, in no acute discomfort.  He has peripheral edema involving all 4 extremities.  He has bruising noted to his left upper arm.  No bruising in his mouth no petechial rash.  Tenderness along his spine no significant to lumbar spine region.  He is globally weak.  He is hard of hearing.  -Labs ordered, independently viewed and interpreted by  me.  Labs remarkable for platelets is 21, similar to prior.  Hemoglobin is 7.6, similar to prior.  Sodium is 129 slightly decreased from prior -The patient was maintained on a cardiac monitor.  I personally viewed and interpreted the cardiac monitored which showed an underlying rhythm of: Normal sinus rhythm -Imaging not considered at this time no headache -This patient presents to the ED for concern of abnormal labs, this involves an extensive number of treatment options, and is a complaint that carries with it a high risk of complications and morbidity.  The differential diagnosis includes lab error, pancytopenia, anemia, electrolyte derangement -Co morbidities that complicate the patient evaluation includes hypertension, thyroid disease, hard of hearing, gout, anemia -Treatment includes monitoring, percocet for back pain  -Reevaluation of the patient after these medicines showed that the patient stayed the same -PCP office notes or outside notes reviewed -Discussion with specialist hematologist, Dr. Arbutus Ped who is well aware of this patient.  He felt that without significant drops of platelets, no additional treatment is indicated as patient is not actively bleeding. -Escalation to admission/observation considered: patients feels much better, is comfortable with discharge, and will follow up with PCP -Prescription medication considered, patient comfortable with home medication -Social Determinant of Health considered   Platelets is at 21 similar to prior.  I discussed care with hematologist Dr. Arbutus Ped who felt patient can continue with his current regimen and his symptoms will likely be a prolonged course.  At this time he may return back to his rehab facility and resume his current treatment.  Wife is agree with plan.         Final Clinical Impression(s) / ED Diagnoses Final diagnoses:  Pancytopenia (HCC)    Rx / DC Orders ED Discharge Orders     None  Fayrene Helper,  PA-C 05/31/23 2205    Cathren Laine, MD 06/05/23 1336

## 2023-05-31 NOTE — ED Notes (Signed)
 [facility] contacted. [phone]

## 2023-06-05 ENCOUNTER — Other Ambulatory Visit: Payer: Self-pay | Admitting: Physician Assistant

## 2023-06-05 DIAGNOSIS — D649 Anemia, unspecified: Secondary | ICD-10-CM

## 2023-06-05 NOTE — Progress Notes (Unsigned)
 [facility] OFFICE PROGRESS NOTE  Georgann Housekeeper, MD 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 The findings were suspicious for drug-induced versus autoimmune induced abnormality. he patient had extensive studies that were unremarkable and his bone marrow biopsy and aspirate performed recently showed no underlying MDS or MPN. The findings were suspicious to be secondary to drug/toxin or autoimmune disorder.   PRIOR THERAPY: None  CURRENT THERAPY: Prednisone 20 mg p.o. daily   INTERVAL HISTORY: Samuel Little 89 y.o. male returns to the clinic today for follow-up visit accompanied by his wife.  The patient is followed for his history of pancytopenia.  The patient was last seen on 05/11/2023.  The patient is currently on 20 mg of prednisone due to suspicious autoimmune mediated abnormality of his platelet count.  The patient is also taking a significant amount of herbal supplements and he was advised to discontinue any unnecessary supplements.  In the interval since being seen he was hospitalized from 3/10 to 3/17 for UTI.  He grew MSSA he was receiving IV antibiotics through PICC line and discharged to rehab facility.  He was having worsening thrombocytopenia thought to be drug-induced.  He received platelet transfusion while in the hospital.. Also splenomegaly noted. ***  Ennever thinks epo def contributing. Ennever recc NGS on blood.   Since being discharged he is feeling ***.  The patient denies any recent fever, chills, night sweats, lymphadenopathy, unexplained weight loss, sore throat, skin infections, abdominal pain, cough, or shortness of breath.  However, he does report dysuria. He denies any abnormal bleeding except if he scratches himself he bleeds and he has some mild upper extremity bruising.  He denies any early satiety or bloating. The patient is here today for evaluation  and repeat blood work.    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  MEDICATIONS:  Current Outpatient Medications  Medication Sig Dispense Refill   acetaminophen (TYLENOL) 650 MG CR tablet Take 1,300 mg every 8 (eight) hours as needed by mouth for pain.      ceFAZolin (ANCEF) IVPB Inject 2 g into the vein every 8 (eight) hours. Indication:  MSSA Bacteremia First Dose: Yes Last Day of Therapy:  07/06/23 Labs - Once weekly:  CBC/D and BMP, Labs - Once weekly: ESR and CRP 114 Units 0   docusate sodium (COLACE) 100 MG capsule Take 1 capsule (100 mg total) by mouth 2 (two) times daily.     doxazosin (CARDURA) 4 MG tablet Take 4 mg by mouth at bedtime.     gabapentin (NEURONTIN) 100 MG capsule Take 1 capsule (100 mg total) by mouth at bedtime. 30 capsule 0   levothyroxine (SYNTHROID) 150 MCG tablet Take 150 mcg by mouth daily before breakfast.  lidocaine (LIDODERM) 5 % Place 1-3 patches onto the skin daily. Remove & Discard patch within 12 hours or as directed by MD     lisinopril (ZESTRIL) 30 MG tablet Take 30 mg by mouth daily.     Menthol-Methyl Salicylate (MUSCLE RUB) 10-15 % CREA Apply 1 Application topically as needed for muscle pain.     methocarbamol (ROBAXIN) 500 MG tablet Take 1 tablet (500 mg total) by mouth 4 (four) times daily.     naphazoline-glycerin (CLEAR EYES REDNESS) 0.012-0.25 % SOLN Place 1-2 drops into both eyes 4  (four) times daily as needed for eye irritation.     oxyCODONE (OXY IR/ROXICODONE) 5 MG immediate release tablet Take 1 tablet (5 mg total) by mouth every 4 (four) hours as needed for severe pain (pain score 7-10) or moderate pain (pain score 4-6). 30 tablet 0   polyethylene glycol (MIRALAX / GLYCOLAX) 17 g packet Take 17 g by mouth daily.     predniSONE (DELTASONE) 10 MG tablet Take 2 tablets (20 mg total) by mouth daily with breakfast. Take 2 tablets daily x 2 weeks 30 tablet 0   No current facility-administered medications for this visit.    SURGICAL HISTORY:  Past Surgical History:  Procedure Laterality Date   ARTHROSCOPY KNEE W/ DRILLING Right    CARDIAC CATHETERIZATION  2007   CARDIAC ELECTROPHYSIOLOGY STUDY AND ABLATION     CATARACT EXTRACTION     CHEST TUBE INSERTION     CHOLECYSTECTOMY  06/26/2016   Procedure: LAPAROSCOPIC CHOLECYSTECTOMY  subtotal;  Surgeon: Almond Lint, MD;  Location: [facility] OR;  Service: General;;   FRACTURE SURGERY Right 2001   right leg   HERNIA REPAIR     IR THORACENTESIS ASP PLEURAL SPACE W/IMG GUIDE  09/09/2022   MASS EXCISION Right 09/22/2015   Procedure: RIGHT THUMB EXCISION MASS;  Surgeon: Betha Loa, MD;  Location: [facility];  Service: Orthopedics;  Laterality: Right;   ROTATOR CUFF REPAIR     ROTATOR CUFF REPAIR Right 2002   TONSILLECTOMY     TOTAL KNEE ARTHROPLASTY Right 02/13/2017   Procedure: TOTAL KNEE ARTHROPLASTY;  Surgeon: Dannielle Huh, MD;  Location: [facility] OR;  Service: Orthopedics;  Laterality: Right;    REVIEW OF SYSTEMS:   Review of Systems  Constitutional: Negative for appetite change, chills, fatigue, fever and unexpected weight change.  HENT:   Negative for mouth sores, nosebleeds, sore throat and trouble swallowing.   Eyes: Negative for eye problems and icterus.  Respiratory: Negative for cough, hemoptysis, shortness of breath and wheezing.   Cardiovascular: Negative for chest pain and leg swelling.  Gastrointestinal:  Negative for abdominal pain, constipation, diarrhea, nausea and vomiting.  Genitourinary: Negative for bladder incontinence, difficulty urinating, dysuria, frequency and hematuria.   Musculoskeletal: Negative for back pain, gait problem, neck pain and neck stiffness.  Skin: Negative for itching and rash.  Neurological: Negative for dizziness, extremity weakness, gait problem, headaches, light-headedness and seizures.  Hematological: Negative for adenopathy. Does not bruise/bleed easily.  Psychiatric/Behavioral: Negative for confusion, depression and sleep disturbance. The patient is not nervous/anxious.     PHYSICAL EXAMINATION:  There were no vitals taken for this visit.  ECOG PERFORMANCE STATUS: {CHL ONC ECOG [id number]  Physical Exam  Constitutional: Oriented to person, place, and time and well-developed, well-nourished, and in no distress. No distress.  HENT:  Head: Normocephalic and atraumatic.  Mouth/Throat: Oropharynx is clear and moist. No oropharyngeal exudate.  Eyes: Conjunctivae are normal. Right eye exhibits no discharge. Left eye  exhibits no discharge. No scleral icterus.  Neck: Normal range of motion. Neck supple.  Cardiovascular: Normal rate, regular rhythm, normal heart sounds and intact distal pulses.   Pulmonary/Chest: Effort normal and breath sounds normal. No respiratory distress. No wheezes. No rales.  Abdominal: Soft. Bowel sounds are normal. Exhibits no distension and no mass. There is no tenderness.  Musculoskeletal: Normal range of motion. Exhibits no edema.  Lymphadenopathy:    No cervical adenopathy.  Neurological: Alert and oriented to person, place, and time. Exhibits normal muscle tone. Gait normal. Coordination normal.  Skin: Skin is warm and dry. No rash noted. Not diaphoretic. No erythema. No pallor.  Psychiatric: Mood, memory and judgment normal.  Vitals reviewed.  LABORATORY DATA: Lab Results  Component Value Date   WBC 4.2 05/31/2023   HGB  7.6 (L) 05/31/2023   HCT 23.6 (L) 05/31/2023   MCV 104.9 (H) 05/31/2023   PLT 21 (LL) 05/31/2023      Chemistry      Component Value Date/Time   NA 129 (L) 05/31/2023 1944   K 4.2 05/31/2023 1944   CL 103 05/31/2023 1944   CO2 22 05/31/2023 1944   BUN 34 (H) 05/31/2023 1944   CREATININE 0.82 05/31/2023 1944   CREATININE 0.89 05/11/2023 1016      Component Value Date/Time   CALCIUM 8.0 (L) 05/31/2023 1944   ALKPHOS 179 (H) 05/11/2023 1016   AST 29 05/11/2023 1016   ALT 36 05/11/2023 1016   BILITOT 1.2 05/11/2023 1016       RADIOGRAPHIC STUDIES:  [place] EKG SITE RITE Result Date: 05/29/2023 If Site Rite image not attached, placement could not be confirmed due to current cardiac rhythm.  CT MAXILLOFACIAL W CONTRAST Result Date: 05/26/2023 CLINICAL DATA:  Abscess EXAM: CT MAXILLOFACIAL WITH CONTRAST TECHNIQUE: Multidetector CT imaging of the maxillofacial structures was performed with intravenous contrast. Multiplanar CT image reconstructions were also generated. RADIATION DOSE REDUCTION: This exam was performed according to the departmental dose-optimization program which includes automated exposure control, adjustment of the mA and/or kV according to patient size and/or use of iterative reconstruction technique. CONTRAST:  75mL OMNIPAQUE IOHEXOL 350 MG/ML SOLN COMPARISON:  None Available. FINDINGS: Osseous: No fracture or mandibular dislocation. No destructive process. Orbits: Negative. No traumatic or inflammatory finding. Sinuses: Clear. Soft tissues: Negative. Limited intracranial: No significant or unexpected finding. IMPRESSION: No acute abnormality of the face. Electronically Signed   By: Deatra Robinson M.D.   On: 05/26/2023 00:31   ECHOCARDIOGRAM COMPLETE Result Date: 05/24/2023    ECHOCARDIOGRAM REPORT   Patient Name:   CHAY MAZZONI Date of Exam: 05/24/2023 Medical Rec #:  [id number]      Height:       73.0 in Accession #:    [id number]     Weight:       190.7 lb Date of Birth:   06-Oct-1932      BSA:          2.109 m Patient Age:    89 years       BP:           120/57 mmHg Patient Gender: M              HR:           74 bpm. Exam Location:  Inpatient Procedure: 2D Echo, Cardiac Doppler, Color Doppler and Intracardiac            Opacification Agent (Both Spectral and Color Flow Doppler were  utilized during procedure). Indications:    Endocarditis  History:        Patient has prior history of Echocardiogram examinations, most                 recent 09/10/2022. Risk Factors:Hypertension.  Sonographer:    Amy Chionchio Referring Phys: [id number] [facility] IMPRESSIONS  1. Left ventricular ejection fraction, by estimation, is 60 to 65%. The left ventricle has normal function. The left ventricle has no regional wall motion abnormalities. There is mild concentric left ventricular hypertrophy. Left ventricular diastolic parameters were normal.  2. Right ventricular systolic function is normal. The right ventricular size is normal.  3. The mitral valve is degenerative. Trivial mitral valve regurgitation. No evidence of mitral stenosis.  4. Moderate tricuspid stenosis.  5. The aortic valve is tricuspid. There is mild calcification of the aortic valve. Aortic valve regurgitation is trivial. Aortic valve sclerosis/calcification is present, without any evidence of aortic stenosis.  6. Aortic dilatation noted. There is mild dilatation of the ascending aorta, measuring 43 mm. There is borderline dilatation of the aortic root, measuring 39 mm.  7. The inferior vena cava is normal in size with <50% respiratory variability, suggesting right atrial pressure of 8 mmHg. FINDINGS  Left Ventricle: Left ventricular ejection fraction, by estimation, is 60 to 65%. The left ventricle has normal function. The left ventricle has no regional wall motion abnormalities. Definity contrast agent was given IV to delineate the left ventricular  endocardial borders. The left ventricular internal cavity size was normal  in size. There is mild concentric left ventricular hypertrophy. Left ventricular diastolic parameters were normal. Right Ventricle: The right ventricular size is normal. No increase in right ventricular wall thickness. Right ventricular systolic function is normal. Left Atrium: Left atrial size was normal in size. Right Atrium: Right atrial size was normal in size. Pericardium: There is no evidence of pericardial effusion. Mitral Valve: The mitral valve is degenerative in appearance. There is mild calcification of the mitral valve leaflet(s). Trivial mitral valve regurgitation. No evidence of mitral valve stenosis. MV peak gradient, 2.4 mmHg. The mean mitral valve gradient  is 1.0 mmHg. Tricuspid Valve: The tricuspid valve is normal in structure. Tricuspid valve regurgitation is trivial. Moderate tricuspid stenosis. Aortic Valve: The aortic valve is tricuspid. There is mild calcification of the aortic valve. Aortic valve regurgitation is trivial. Aortic valve sclerosis/calcification is present, without any evidence of aortic stenosis. Aortic valve mean gradient measures 6.0 mmHg. Aortic valve peak gradient measures 12.0 mmHg. Aortic valve area, by VTI measures 2.33 cm. Pulmonic Valve: The pulmonic valve was normal in structure. Pulmonic valve regurgitation is trivial. No evidence of pulmonic stenosis. Aorta: Aortic dilatation noted. There is mild dilatation of the ascending aorta, measuring 43 mm. There is borderline dilatation of the aortic root, measuring 39 mm. Venous: The inferior vena cava is normal in size with less than 50% respiratory variability, suggesting right atrial pressure of 8 mmHg. IAS/Shunts: No atrial level shunt detected by color flow Doppler.  LEFT VENTRICLE PLAX 2D LVIDd:         5.20 cm      Diastology LVIDs:         3.70 cm      LV e' medial:    9.25 cm/s LV PW:         1.00 cm      LV E/e' medial:  8.7 LV IVS:        1.10 cm      LV  e' lateral:   11.00 cm/s LVOT diam:     2.10 cm      LV  E/e' lateral: 7.3 LV SV:         77 LV SV Index:   36 LVOT Area:     3.46 cm  LV Volumes (MOD) LV vol d, MOD A2C: 164.0 ml LV vol d, MOD A4C: 143.0 ml LV vol s, MOD A2C: 73.2 ml LV vol s, MOD A4C: 61.6 ml LV SV MOD A2C:     90.8 ml LV SV MOD A4C:     143.0 ml LV SV MOD BP:      97.2 ml RIGHT VENTRICLE             IVC RV Basal diam:  3.30 cm     IVC diam: 2.00 cm RV S prime:     11.40 cm/s TAPSE (M-mode): 1.7 cm LEFT ATRIUM              Index        RIGHT ATRIUM           Index LA Vol (A2C):   109.0 ml 51.70 ml/m  RA Area:     12.20 cm LA Vol (A4C):   93.8 ml  44.49 ml/m  RA Volume:   23.30 ml  11.05 ml/m LA Biplane Vol: 103.0 ml 48.85 ml/m  AORTIC VALVE                     PULMONIC VALVE AV Area (Vmax):    2.18 cm      PV Vmax:       0.86 m/s AV Area (Vmean):   2.10 cm      PV Peak grad:  3.0 mmHg AV Area (VTI):     2.33 cm AV Vmax:           173.00 cm/s AV Vmean:          119.000 cm/s AV VTI:            0.330 m AV Peak Grad:      12.0 mmHg AV Mean Grad:      6.0 mmHg LVOT Vmax:         109.00 cm/s LVOT Vmean:        72.200 cm/s LVOT VTI:          0.222 m LVOT/AV VTI ratio: 0.67  AORTA Ao Root diam: 3.90 cm Ao Asc diam:  4.25 cm MITRAL VALVE MV Area (PHT): 3.40 cm    SHUNTS MV Area VTI:   3.46 cm    Systemic VTI:  0.22 m MV Peak grad:  2.4 mmHg    Systemic Diam: 2.10 cm MV Mean grad:  1.0 mmHg MV Vmax:       0.78 m/s MV Vmean:      45.5 cm/s MV Decel Time: 223 msec MV E velocity: 80.70 cm/s MV A velocity: 48.20 cm/s MV E/A ratio:  1.67 Arvilla Meres MD Electronically signed by Arvilla Meres MD Signature Date/Time: 05/24/2023/12:45:28 PM    Final    MR THORACIC SPINE WO CONTRAST Result Date: 05/24/2023 CLINICAL DATA:  Infection EXAM: MRI THORACIC AND LUMBAR SPINE WITHOUT CONTRAST TECHNIQUE: Multiplanar and multiecho pulse sequences of the thoracic and lumbar spine were obtained without intravenous contrast. COMPARISON:  01/18/2022 lumbar spine MRI FINDINGS: MRI THORACIC SPINE FINDINGS Alignment:   Physiologic. Vertebrae: No fracture, evidence of discitis, or bone lesion. Cord:  Normal signal and morphology. Paraspinal and other soft  tissues: Negative. Disc levels: T2-3: Small disc bulge without spinal canal stenosis. No other spinal canal or neural foraminal stenosis. MRI LUMBAR SPINE FINDINGS Segmentation:  Standard. Alignment:  Grade 1 retrolisthesis at L1-2, L2-3 and L3-4 Vertebrae:  No fracture, evidence of discitis, or bone lesion. Conus medullaris and cauda equina: Conus extends to the L1 level. Conus and cauda equina appear normal. Paraspinal and other soft tissues: Negative. Disc levels: L1-L2: Small disc bulge with endplate spurring. No spinal canal stenosis. Severe right and mild left neural foraminal stenosis. L2-L3: Small disc bulge. No spinal canal stenosis. No neural foraminal stenosis. L3-L4: Intermediate sized disc bulge with endplate spurring, unchanged. Moderate spinal canal stenosis. Mild right and severe left neural foraminal stenosis. L4-L5: Unchanged mild disc bulge with endplate spurring. Narrowing of both lateral recesses without central spinal canal stenosis. Moderate bilateral neural foraminal stenosis. L5-S1: Intermediate sized disc bulge with mild facet hypertrophy. Unchanged moderate spinal canal stenosis. Severe bilateral neural foraminal stenosis. Visualized sacrum: Normal. IMPRESSION: 1. No acute abnormality of the thoracic or lumbar spine. 2. Unchanged moderate spinal canal stenosis at L3-4 and L5-S1. 3. Unchanged severe right L1-2, left L3-4 and bilateral L5-S1 neural foraminal stenosis. 4. 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 Electronically Signed   By: Deatra Robinson M.D.   On: 05/24/2023 01:43   MR LUMBAR SPINE WO CONTRAST Result Date: 05/24/2023 CLINICAL DATA:  Infection EXAM: MRI THORACIC AND LUMBAR SPINE WITHOUT CONTRAST TECHNIQUE: Multiplanar and multiecho pulse sequences of the thoracic and lumbar spine were obtained without intravenous  contrast. COMPARISON:  01/18/2022 lumbar spine MRI FINDINGS: MRI THORACIC SPINE FINDINGS Alignment:  Physiologic. Vertebrae: No fracture, evidence of discitis, or bone lesion. Cord:  Normal signal and morphology. Paraspinal and other soft tissues: Negative. Disc levels: T2-3: Small disc bulge without spinal canal stenosis. No other spinal canal or neural foraminal stenosis. MRI LUMBAR SPINE FINDINGS Segmentation:  Standard. Alignment:  Grade 1 retrolisthesis at L1-2, L2-3 and L3-4 Vertebrae:  No fracture, evidence of discitis, or bone lesion. Conus medullaris and cauda equina: Conus extends to the L1 level. Conus and cauda equina appear normal. Paraspinal and other soft tissues: Negative. Disc levels: L1-L2: Small disc bulge with endplate spurring. No spinal canal stenosis. Severe right and mild left neural foraminal stenosis. L2-L3: Small disc bulge. No spinal canal stenosis. No neural foraminal stenosis. L3-L4: Intermediate sized disc bulge with endplate spurring, unchanged. Moderate spinal canal stenosis. Mild right and severe left neural foraminal stenosis. L4-L5: Unchanged mild disc bulge with endplate spurring. Narrowing of both lateral recesses without central spinal canal stenosis. Moderate bilateral neural foraminal stenosis. L5-S1: Intermediate sized disc bulge with mild facet hypertrophy. Unchanged moderate spinal canal stenosis. Severe bilateral neural foraminal stenosis. Visualized sacrum: Normal. IMPRESSION: 1. No acute abnormality of the thoracic or lumbar spine. 2. Unchanged moderate spinal canal stenosis at L3-4 and L5-S1. 3. Unchanged severe right L1-2, left L3-4 and bilateral L5-S1 neural foraminal stenosis. 4. 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 Electronically Signed   By: Deatra Robinson M.D.   On: 05/24/2023 01:43   US RENAL Result Date: 05/23/2023 CLINICAL DATA:  Acute kidney injury. EXAM: RENAL / URINARY TRACT ULTRASOUND COMPLETE COMPARISON:  None Available.  FINDINGS: Right Kidney: Renal measurements: 8.2 cm x 5.3 cm x 4.5 cm = volume: 109.4 mL. Echogenicity within normal limits. A 17.7 mm shadowing echogenic renal calculus is seen within the right kidney. No mass or hydronephrosis visualized. Left Kidney: Renal measurements: 11.8 cm x 4.1 cm x 4.7 cm =  volume: 119 mL. Echogenicity within normal limits. An 8.9 mm shadowing echogenic renal calculus is seen within the left kidney. No mass or hydronephrosis visualized. Bladder: Echogenic material is seen within the bladder lumen. Other: It should be noted that the study is technically limited secondary to overlying bowel gas, as per the ultrasound technologist. Of incidental note is the presence of an enlarged spleen (17.4 cm in length). IMPRESSION: 1. Bilateral nonobstructing renal calculi. 2. Echogenic material within the urinary bladder which may represent sequelae associated with recent infection. Correlation with urinalysis is recommended. 3. Splenomegaly. Electronically Signed   By: Aram Candela M.D.   On: 05/23/2023 01:11   CT Renal Stone Study Result Date: 05/22/2023 CLINICAL DATA:  Abdominal/flank pain, stone suspected EXAM: CT ABDOMEN AND PELVIS WITHOUT CONTRAST TECHNIQUE: Multidetector CT imaging of the abdomen and pelvis was performed following the standard protocol without IV contrast. RADIATION DOSE REDUCTION: This exam was performed according to the departmental dose-optimization program which includes automated exposure control, adjustment of the mA and/or kV according to patient size and/or use of iterative reconstruction technique. COMPARISON:  11/20/2017 FINDINGS: Lower chest: Small left pleural effusion. Linear atelectasis in the left lower lobe. Hepatobiliary: Unremarkable unenhanced appearance of the liver. Clips in the gallbladder fossa postcholecystectomy. No biliary dilatation. Pancreas: No ductal dilatation or inflammation. Spleen: The spleen is enlarged, 15.4 cm cranial caudal.  Adrenals/Urinary Tract: No adrenal nodule. There are bilateral nonobstructing intrarenal calculi. No hydronephrosis. Both ureters are decompressed without ureteral stone. The urinary bladder is partially distended, no bladder stone or wall thickening. Stomach/Bowel: Small hiatal hernia. The stomach is nondistended. No small bowel obstruction or inflammatory change. Moderate diffuse colonic diverticulosis. Equivocal faint stranding about a diverticulum involving the distal descending colon series 6, image 81, may represent mild diverticulitis. Moderate volume of stool in the colon. Sigmoid colon is redundant. Vascular/Lymphatic: Aortic atherosclerosis. The aorta is tortuous but nonaneurysmal. No bulky abdominopelvic adenopathy. Reproductive: Enlarged prostate spans 5.9 cm transverse. Other: No ascites. No free air or focal fluid collection small bilateral inguinal hernias contain fat and a small amount of free fluid. Mild body wall edema. Musculoskeletal: The bones are subjectively under mineralized. Diffuse degenerative change throughout the lumbar spine with multilevel degenerative disc disease and facet hypertrophy. No fracture or acute osseous findings. IMPRESSION: 1. Bilateral nonobstructing intrarenal calculi. No ureteral stone or hydronephrosis. 2. Moderate diffuse colonic diverticulosis. Equivocal faint stranding about a diverticulum involving the distal descending colon, may represent mild diverticulitis. 3. Diffuse degenerative change throughout the spine without acute osseous findings. 4. Small left pleural effusion. 5. Chronic incidental findings include small hiatal hernia. Splenomegaly. Enlarged prostate. 6. Small bilateral inguinal hernias contain fat and a small amount of free fluid. Aortic Atherosclerosis (ICD10-I70.0). Electronically Signed   By: Narda Rutherford M.D.   On: 05/22/2023 18:29     ASSESSMENT/PLAN:  This is a very pleasant 89 year old Caucasian male with pancytopenia likely  secondary to medication or autoimmune disorder specially with the elevated rheumatoid factor. The patient had extensive studies that were unremarkable and his bone marrow biopsy and aspirate performed recently showed no underlying MDS or MPN. The findings were suspicious to be secondary to drug/toxin or autoimmune disorder.    **NGS, EPO  The patient was seen with Dr. Arbutus Ped.  Patient was previously on prednisone ***milligrams.  Dr. Arbutus Ped had a lengthy discussion the patient today about his current condition and recommendations.  Dr. Arbutus Ped recommends ***  His labs today show a platelet count of ***, blood cell  count of ***, and hemoglobin of ***   Standing orders for sample of blood bank.  Follow-up?  Text following I will arrange for ***  He will follow with his PCP for his UTI.     No orders of the defined types were placed in this encounter.    I spent {CHL ONC TIME VISIT - ZOXWR:[id number]} counseling the patient face to face. The total time spent in the appointment was {CHL ONC TIME VISIT - BJYNW:[id number]}.  [place] Oborn L Davionte Lusby, PA-C 06/05/23

## 2023-06-07 ENCOUNTER — Telehealth: Payer: Self-pay | Admitting: Internal Medicine

## 2023-06-07 ENCOUNTER — Inpatient Hospital Stay

## 2023-06-07 ENCOUNTER — Inpatient Hospital Stay (HOSPITAL_BASED_OUTPATIENT_CLINIC_OR_DEPARTMENT_OTHER): Admitting: Physician Assistant

## 2023-06-07 VITALS — BP 114/46 | HR 62 | Temp 97.6°F | Resp 18 | Ht 73.0 in

## 2023-06-07 DIAGNOSIS — T82838A Hemorrhage of vascular prosthetic devices, implants and grafts, initial encounter: Secondary | ICD-10-CM | POA: Diagnosis not present

## 2023-06-07 DIAGNOSIS — D61818 Other pancytopenia: Secondary | ICD-10-CM | POA: Insufficient documentation

## 2023-06-07 DIAGNOSIS — R609 Edema, unspecified: Secondary | ICD-10-CM | POA: Diagnosis not present

## 2023-06-07 DIAGNOSIS — D696 Thrombocytopenia, unspecified: Secondary | ICD-10-CM | POA: Insufficient documentation

## 2023-06-07 DIAGNOSIS — M48062 Spinal stenosis, lumbar region with neurogenic claudication: Secondary | ICD-10-CM | POA: Diagnosis not present

## 2023-06-07 DIAGNOSIS — Z7952 Long term (current) use of systemic steroids: Secondary | ICD-10-CM | POA: Insufficient documentation

## 2023-06-07 DIAGNOSIS — D649 Anemia, unspecified: Secondary | ICD-10-CM

## 2023-06-07 DIAGNOSIS — C9200 Acute myeloblastic leukemia, not having achieved remission: Secondary | ICD-10-CM | POA: Diagnosis not present

## 2023-06-07 LAB — CBC WITH DIFFERENTIAL (CANCER CENTER ONLY)
Abs Immature Granulocytes: 0.23 10*3/uL — ABNORMAL HIGH (ref 0.00–0.07)
Basophils Absolute: 0 10*3/uL (ref 0.0–0.1)
Basophils Relative: 0 %
Eosinophils Absolute: 0 10*3/uL (ref 0.0–0.5)
Eosinophils Relative: 0 %
HCT: 20.9 % — ABNORMAL LOW (ref 39.0–52.0)
Hemoglobin: 7 g/dL — ABNORMAL LOW (ref 13.0–17.0)
Immature Granulocytes: 5 %
Lymphocytes Relative: 12 %
Lymphs Abs: 0.6 10*3/uL — ABNORMAL LOW (ref 0.7–4.0)
MCH: 34.7 pg — ABNORMAL HIGH (ref 26.0–34.0)
MCHC: 33.5 g/dL (ref 30.0–36.0)
MCV: 103.5 fL — ABNORMAL HIGH (ref 80.0–100.0)
Monocytes Absolute: 3.5 10*3/uL — ABNORMAL HIGH (ref 0.1–1.0)
Monocytes Relative: 69 %
Neutro Abs: 0.7 10*3/uL — ABNORMAL LOW (ref 1.7–7.7)
Neutrophils Relative %: 14 %
Platelet Count: 20 10*3/uL — ABNORMAL LOW (ref 150–400)
RBC: 2.02 MIL/uL — ABNORMAL LOW (ref 4.22–5.81)
RDW: 22.3 % — ABNORMAL HIGH (ref 11.5–15.5)
Smear Review: NORMAL
WBC Count: 5.2 10*3/uL (ref 4.0–10.5)
nRBC: 1.6 % — ABNORMAL HIGH (ref 0.0–0.2)

## 2023-06-07 LAB — SAMPLE TO BLOOD BANK

## 2023-06-07 LAB — CMP (CANCER CENTER ONLY)
ALT: 6 U/L (ref 0–44)
AST: 39 U/L (ref 15–41)
Albumin: 2.2 g/dL — ABNORMAL LOW (ref 3.5–5.0)
Alkaline Phosphatase: 229 U/L — ABNORMAL HIGH (ref 38–126)
Anion gap: 5 (ref 5–15)
BUN: 43 mg/dL — ABNORMAL HIGH (ref 8–23)
CO2: 24 mmol/L (ref 22–32)
Calcium: 8.4 mg/dL — ABNORMAL LOW (ref 8.9–10.3)
Chloride: 105 mmol/L (ref 98–111)
Creatinine: 0.92 mg/dL (ref 0.61–1.24)
GFR, Estimated: >60 mL/min (ref >60)
Glucose, Bld: 103 mg/dL — ABNORMAL HIGH (ref 70–99)
Potassium: 4.1 mmol/L (ref 3.5–5.1)
Sodium: 134 mmol/L — ABNORMAL LOW (ref 135–145)
Total Bilirubin: 1.3 mg/dL — ABNORMAL HIGH (ref 0.0–1.2)
Total Protein: 6.3 g/dL — ABNORMAL LOW (ref 6.5–8.1)

## 2023-06-07 LAB — PREPARE RBC (CROSSMATCH)

## 2023-06-07 MED ORDER — SODIUM CHLORIDE 0.9% FLUSH: 10.0000 mL | INTRAVENOUS | Status: DC | PRN

## 2023-06-07 MED ORDER — SODIUM CHLORIDE 0.9% IV SOLUTION
250.0000 mL | INTRAVENOUS | Status: DC
  Administered 2023-06-07: 250 mL via INTRAVENOUS

## 2023-06-07 MED ORDER — ACETAMINOPHEN 325 MG PO TABS
650.0000 mg | ORAL_TABLET | Freq: Once | ORAL | Status: AC
  Administered 2023-06-07: 650 mg via ORAL
  Filled 2023-06-07: qty 2

## 2023-06-07 MED ORDER — HEPARIN SOD (PORK) LOCK FLUSH 100 UNIT/ML IV SOLN: 250.0000 [IU] | INTRAVENOUS | Status: DC | PRN

## 2023-06-07 NOTE — Telephone Encounter (Signed)
 Left the patient a voicemail with the appointment details and will be mailed a reminder.

## 2023-06-07 NOTE — Patient Instructions (Signed)
 Platelet Transfusion A platelet transfusion is a procedure in which a person receives donated platelets through an IV. Platelets are parts of blood that stick together and form a clot to help the body stop bleeding after an injury. If you have too few platelets, your blood may have trouble clotting. This may cause you to bleed and bruise very easily. You may need a platelet transfusion if you have a condition that causes a low number of platelets (thrombocytopenia). A platelet transfusion may be used to stop or prevent excessive bleeding. Tell a health care provider about: Any reactions you have had during previous transfusions. Any allergies you have. All medicines you are taking, including vitamins, herbs, eye drops, creams, and over-the-counter medicines. Any bleeding problems you have. Any surgeries you have had. Any medical conditions you have. Whether you are pregnant or may be pregnant. What are the risks? Generally, this is a safe procedure. However, problems may occur, including: Fever. Infection. Allergic reaction to the donated (donor) platelets. Your body's disease-fighting system (immune system) attacking the donor platelets (hemolytic reaction). This is rare. A rare reaction that causes lung damage (transfusion-related acute lung injury). What happens before the procedure? Medicines Ask your health care provider about: Changing or stopping your regular medicines. This is especially important if you are taking diabetes medicines or blood thinners. Taking medicines such as aspirin and ibuprofen. These medicines can thin your blood. Do not take these medicines unless your health care provider tells you to take them. Taking over-the-counter medicines, vitamins, herbs, and supplements. General instructions You will have a blood test to determine your blood type. Your blood type determines what kind of platelets you will be given. Follow instructions from your health care provider  about eating or drinking restrictions. If you have had an allergic reaction to a transfusion in the past, you may be given medicine to help prevent a reaction. Your temperature, blood pressure, pulse, and breathing will be monitored. What happens during the procedure?  An IV will be inserted into one of your veins. For your safety, two health care providers will verify your identity along with the donor platelets about to be infused. A bag of donor platelets will be connected to your IV. The platelets will flow into your bloodstream. This usually takes 30-60 minutes. Your temperature, blood pressure, pulse, and breathing will be monitored during the transfusion. This helps detect early signs of any reaction. You will also be monitored for other symptoms that may indicate a reaction, including chills, hives, or itching. If you have signs of a reaction at any time, your transfusion will be stopped, and you may be given medicine to help manage the reaction. When your transfusion is complete, your IV will be removed. Pressure may be applied to the IV site for a few minutes to stop any bleeding. The IV site will be covered with a bandage (dressing). The procedure may vary among health care providers and hospitals. What can I expect after the procedure? Your blood pressure, temperature, pulse, and breathing will be monitored until you leave the hospital or clinic. You may have some bruising and soreness at your IV site. Follow these instructions at home: Medicines Take over-the-counter and prescription medicines only as told by your health care provider. Talk with your health care provider before you take any medicines that contain aspirin or NSAIDs, such as ibuprofen. These medicines increase your risk for dangerous bleeding. IV site care Check your IV site every day for signs of infection. Check for:  Redness, swelling, or pain. Fluid or blood. If fluid or blood drains from your IV site, use your  hands to press down firmly on a bandage covering the area for a minute or two. Doing this should stop the bleeding. Warmth. Pus or a bad smell. General instructions Change or remove your dressing as told by your health care provider. Return to your normal activities as told by your health care provider. Ask your health care provider what activities are safe for you. Do not take baths, swim, or use a hot tub until your health care provider approves. Ask your health care provider if you may take showers. Keep all follow-up visits. This is important. Contact a health care provider if: You have a headache that does not go away with medicine. You have hives, rash, or itchy skin. You have nausea or vomiting. You feel unusually tired or weak. You have signs of infection at your IV site. Get help right away if: You have a fever or chills. You urinate less often than usual. Your urine is darker colored than normal. You have any of the following: Trouble breathing. Pain in your back, abdomen, or chest. Cool, clammy skin. A fast heartbeat. Summary Platelets are tiny pieces of blood cells that clump together to form a blood clot when you have an injury. If you have too few platelets, your blood may have trouble clotting. A platelet transfusion is a procedure in which you receive donated platelets through an IV. A platelet transfusion may be used to stop or prevent excessive bleeding. After the procedure, check your IV site every day for signs of infection. This information is not intended to replace advice given to you by your health care provider. Make sure you discuss any questions you have with your health care provider. Document Revised: 09/29/2022 Document Reviewed: 09/03/2020 Elsevier Patient Education  2024 [employer].

## 2023-06-07 NOTE — Patient Instructions (Addendum)
-  We are seeing you for the low platelet and low red blood cells. -Please see if there is a provider in [place] [facility] that could evaluate you for your swelling as this is out of our scope AT hematology.  He reports he is swelling and gaining weight.  Please monitor weights and reach out to provider at facility for recommendations for weight gain.  -If there is no provider at rehab, then please call his family doctor to arrange a follow-up -The patient needs 2 units of blood and 1 unit of platelets.  I have scheduled the blood for _______________________. Please arrange transportation if needed.  -He needs to keep the blue bracelet on his arm when he returns to the clinic for blood!!!!  Please do not take this off.  If this is taken off he will not be able to receive blood. -Will see him back in 2 weeks for follow-up visit -I have attached a copy of his lab work today for your review -He needs to continue taking his 20 mg of prednisone daily for his low platelet count.

## 2023-06-08 ENCOUNTER — Other Ambulatory Visit: Payer: Self-pay

## 2023-06-08 ENCOUNTER — Telehealth: Payer: Self-pay | Admitting: Medical Oncology

## 2023-06-08 ENCOUNTER — Inpatient Hospital Stay (HOSPITAL_COMMUNITY)
Admission: EM | Admit: 2023-06-08 | Discharge: 2023-06-11 | DRG: 314 | Disposition: A | Discharge status: Hospice | Source: Skilled Nursing Facility | Attending: Internal Medicine | Admitting: Internal Medicine

## 2023-06-08 ENCOUNTER — Emergency Department (HOSPITAL_COMMUNITY)

## 2023-06-08 DIAGNOSIS — T82838A Hemorrhage of vascular prosthetic devices, implants and grafts, initial encounter: Secondary | ICD-10-CM | POA: Diagnosis not present

## 2023-06-08 DIAGNOSIS — R600 Localized edema: Secondary | ICD-10-CM

## 2023-06-08 DIAGNOSIS — M7989 Other specified soft tissue disorders: Secondary | ICD-10-CM | POA: Diagnosis present

## 2023-06-08 DIAGNOSIS — Z87442 Personal history of urinary calculi: Secondary | ICD-10-CM

## 2023-06-08 DIAGNOSIS — U071 COVID-19: Principal | ICD-10-CM | POA: Diagnosis present

## 2023-06-08 DIAGNOSIS — H3530 Unspecified macular degeneration: Secondary | ICD-10-CM | POA: Diagnosis present

## 2023-06-08 DIAGNOSIS — L8990 Pressure ulcer of unspecified site, unspecified stage: Secondary | ICD-10-CM

## 2023-06-08 DIAGNOSIS — K7460 Unspecified cirrhosis of liver: Secondary | ICD-10-CM | POA: Diagnosis present

## 2023-06-08 DIAGNOSIS — T82524A Displacement of infusion catheter, initial encounter: Secondary | ICD-10-CM | POA: Diagnosis present

## 2023-06-08 DIAGNOSIS — R7881 Bacteremia: Secondary | ICD-10-CM | POA: Diagnosis present

## 2023-06-08 DIAGNOSIS — K5900 Constipation, unspecified: Secondary | ICD-10-CM | POA: Diagnosis present

## 2023-06-08 DIAGNOSIS — I4510 Unspecified right bundle-branch block: Secondary | ICD-10-CM | POA: Diagnosis present

## 2023-06-08 DIAGNOSIS — I10 Essential (primary) hypertension: Secondary | ICD-10-CM | POA: Diagnosis present

## 2023-06-08 DIAGNOSIS — D61811 Other drug-induced pancytopenia: Secondary | ICD-10-CM | POA: Diagnosis present

## 2023-06-08 DIAGNOSIS — Z8249 Family history of ischemic heart disease and other diseases of the circulatory system: Secondary | ICD-10-CM

## 2023-06-08 DIAGNOSIS — D849 Immunodeficiency, unspecified: Secondary | ICD-10-CM | POA: Diagnosis present

## 2023-06-08 DIAGNOSIS — I700 Atherosclerosis of aorta: Secondary | ICD-10-CM | POA: Diagnosis present

## 2023-06-08 DIAGNOSIS — Z833 Family history of diabetes mellitus: Secondary | ICD-10-CM

## 2023-06-08 DIAGNOSIS — Z79891 Long term (current) use of opiate analgesic: Secondary | ICD-10-CM

## 2023-06-08 DIAGNOSIS — D62 Acute posthemorrhagic anemia: Secondary | ICD-10-CM | POA: Diagnosis present

## 2023-06-08 DIAGNOSIS — M109 Gout, unspecified: Secondary | ICD-10-CM | POA: Diagnosis present

## 2023-06-08 DIAGNOSIS — R188 Other ascites: Secondary | ICD-10-CM | POA: Diagnosis present

## 2023-06-08 DIAGNOSIS — R4589 Other symptoms and signs involving emotional state: Secondary | ICD-10-CM

## 2023-06-08 DIAGNOSIS — Z96651 Presence of right artificial knee joint: Secondary | ICD-10-CM | POA: Diagnosis present

## 2023-06-08 DIAGNOSIS — I440 Atrioventricular block, first degree: Secondary | ICD-10-CM | POA: Diagnosis present

## 2023-06-08 DIAGNOSIS — B9561 Methicillin susceptible Staphylococcus aureus infection as the cause of diseases classified elsewhere: Secondary | ICD-10-CM | POA: Diagnosis present

## 2023-06-08 DIAGNOSIS — L89322 Pressure ulcer of left buttock, stage 2: Secondary | ICD-10-CM | POA: Diagnosis present

## 2023-06-08 DIAGNOSIS — G8929 Other chronic pain: Secondary | ICD-10-CM | POA: Diagnosis present

## 2023-06-08 DIAGNOSIS — N390 Urinary tract infection, site not specified: Secondary | ICD-10-CM | POA: Diagnosis present

## 2023-06-08 DIAGNOSIS — F419 Anxiety disorder, unspecified: Secondary | ICD-10-CM | POA: Diagnosis present

## 2023-06-08 DIAGNOSIS — Y828 Other medical devices associated with adverse incidents: Secondary | ICD-10-CM | POA: Diagnosis present

## 2023-06-08 DIAGNOSIS — D689 Coagulation defect, unspecified: Secondary | ICD-10-CM | POA: Diagnosis present

## 2023-06-08 DIAGNOSIS — Y838 Other surgical procedures as the cause of abnormal reaction of the patient, or of later complication, without mention of misadventure at the time of the procedure: Secondary | ICD-10-CM | POA: Diagnosis present

## 2023-06-08 DIAGNOSIS — E039 Hypothyroidism, unspecified: Secondary | ICD-10-CM | POA: Diagnosis present

## 2023-06-08 DIAGNOSIS — Z711 Person with feared health complaint in whom no diagnosis is made: Secondary | ICD-10-CM

## 2023-06-08 DIAGNOSIS — D696 Thrombocytopenia, unspecified: Secondary | ICD-10-CM | POA: Diagnosis present

## 2023-06-08 DIAGNOSIS — Z66 Do not resuscitate: Secondary | ICD-10-CM | POA: Diagnosis present

## 2023-06-08 DIAGNOSIS — Z7189 Other specified counseling: Secondary | ICD-10-CM

## 2023-06-08 DIAGNOSIS — I119 Hypertensive heart disease without heart failure: Secondary | ICD-10-CM | POA: Diagnosis present

## 2023-06-08 DIAGNOSIS — J9811 Atelectasis: Secondary | ICD-10-CM | POA: Diagnosis present

## 2023-06-08 DIAGNOSIS — H9190 Unspecified hearing loss, unspecified ear: Secondary | ICD-10-CM | POA: Diagnosis present

## 2023-06-08 DIAGNOSIS — Z515 Encounter for palliative care: Secondary | ICD-10-CM

## 2023-06-08 DIAGNOSIS — D649 Anemia, unspecified: Secondary | ICD-10-CM

## 2023-06-08 DIAGNOSIS — M48062 Spinal stenosis, lumbar region with neurogenic claudication: Secondary | ICD-10-CM | POA: Diagnosis present

## 2023-06-08 DIAGNOSIS — R52 Pain, unspecified: Secondary | ICD-10-CM

## 2023-06-08 DIAGNOSIS — Z79899 Other long term (current) drug therapy: Secondary | ICD-10-CM

## 2023-06-08 DIAGNOSIS — L89153 Pressure ulcer of sacral region, stage 3: Secondary | ICD-10-CM | POA: Diagnosis present

## 2023-06-08 DIAGNOSIS — L89312 Pressure ulcer of right buttock, stage 2: Secondary | ICD-10-CM | POA: Diagnosis present

## 2023-06-08 DIAGNOSIS — D61818 Other pancytopenia: Secondary | ICD-10-CM | POA: Diagnosis present

## 2023-06-08 DIAGNOSIS — R001 Bradycardia, unspecified: Secondary | ICD-10-CM | POA: Diagnosis not present

## 2023-06-08 DIAGNOSIS — Z79890 Hormone replacement therapy (postmenopausal): Secondary | ICD-10-CM

## 2023-06-08 DIAGNOSIS — C9200 Acute myeloblastic leukemia, not having achieved remission: Secondary | ICD-10-CM | POA: Diagnosis not present

## 2023-06-08 DIAGNOSIS — H04123 Dry eye syndrome of bilateral lacrimal glands: Secondary | ICD-10-CM | POA: Diagnosis present

## 2023-06-08 DIAGNOSIS — Z974 Presence of external hearing-aid: Secondary | ICD-10-CM

## 2023-06-08 DIAGNOSIS — R161 Splenomegaly, not elsewhere classified: Secondary | ICD-10-CM | POA: Diagnosis present

## 2023-06-08 DIAGNOSIS — Z789 Other specified health status: Secondary | ICD-10-CM

## 2023-06-08 DIAGNOSIS — J90 Pleural effusion, not elsewhere classified: Secondary | ICD-10-CM | POA: Diagnosis present

## 2023-06-08 LAB — CBC WITH DIFFERENTIAL/PLATELET
Abs Immature Granulocytes: 0.3 10*3/uL — ABNORMAL HIGH (ref 0.00–0.07)
Band Neutrophils: 2 %
Basophils Absolute: 0 10*3/uL (ref 0.0–0.1)
Basophils Relative: 0 %
Eosinophils Absolute: 0 10*3/uL (ref 0.0–0.5)
Eosinophils Relative: 0 %
HCT: 19.4 % — ABNORMAL LOW (ref 39.0–52.0)
Hemoglobin: 6.2 g/dL — CL (ref 13.0–17.0)
Lymphocytes Relative: 16 %
Lymphs Abs: 0.4 10*3/uL — ABNORMAL LOW (ref 0.7–4.0)
MCH: 34.6 pg — ABNORMAL HIGH (ref 26.0–34.0)
MCHC: 32 g/dL (ref 30.0–36.0)
MCV: 108.4 fL — ABNORMAL HIGH (ref 80.0–100.0)
Metamyelocytes Relative: 7 %
Monocytes Absolute: 0.1 10*3/uL (ref 0.1–1.0)
Monocytes Relative: 3 %
Myelocytes: 4 %
Neutro Abs: 0.8 10*3/uL — ABNORMAL LOW (ref 1.7–7.7)
Neutrophils Relative %: 30 %
Other: 38 %
Platelets: 18 10*3/uL — CL (ref 150–400)
RBC: 1.79 MIL/uL — ABNORMAL LOW (ref 4.22–5.81)
RDW: 22.2 % — ABNORMAL HIGH (ref 11.5–15.5)
WBC: 2.4 10*3/uL — ABNORMAL LOW (ref 4.0–10.5)
nRBC: 2.1 % — ABNORMAL HIGH (ref 0.0–0.2)
nRBC: 7 /100{WBCs} — ABNORMAL HIGH

## 2023-06-08 LAB — PREPARE PLATELET PHERESIS: Unit division: 0

## 2023-06-08 LAB — BPAM PLATELET PHERESIS
Blood Product Expiration Date: 202503272359
ISSUE DATE / TIME: 202503261341
Unit Type and Rh: 5100

## 2023-06-08 LAB — COMPREHENSIVE METABOLIC PANEL WITH GFR
ALT: 10 U/L (ref 0–44)
AST: 45 U/L — ABNORMAL HIGH (ref 15–41)
Albumin: 1.9 g/dL — ABNORMAL LOW (ref 3.5–5.0)
Alkaline Phosphatase: 189 U/L — ABNORMAL HIGH (ref 38–126)
Anion gap: 6 (ref 5–15)
BUN: 58 mg/dL — ABNORMAL HIGH (ref 8–23)
CO2: 21 mmol/L — ABNORMAL LOW (ref 22–32)
Calcium: 8.3 mg/dL — ABNORMAL LOW (ref 8.9–10.3)
Chloride: 106 mmol/L (ref 98–111)
Creatinine, Ser: 1.05 mg/dL (ref 0.61–1.24)
GFR, Estimated: >60 mL/min (ref >60)
Glucose, Bld: 141 mg/dL — ABNORMAL HIGH (ref 70–99)
Potassium: 4.4 mmol/L (ref 3.5–5.1)
Sodium: 133 mmol/L — ABNORMAL LOW (ref 135–145)
Total Bilirubin: 1 mg/dL (ref 0.0–1.2)
Total Protein: 6 g/dL — ABNORMAL LOW (ref 6.5–8.1)

## 2023-06-08 LAB — RESP PANEL BY RT-PCR (RSV, FLU A&B, COVID)  RVPGX2
Influenza A by PCR: NEGATIVE
Influenza B by PCR: NEGATIVE
Resp Syncytial Virus by PCR: NEGATIVE
SARS Coronavirus 2 by RT PCR: POSITIVE — AB

## 2023-06-08 MED ORDER — IOHEXOL 300 MG/ML  SOLN
75.0000 mL | Freq: Once | INTRAMUSCULAR | Status: AC | PRN
  Administered 2023-06-08: 75 mL via INTRAVENOUS

## 2023-06-08 MED ORDER — SODIUM CHLORIDE 0.9% IV SOLUTION: Freq: Once | INTRAVENOUS | Status: AC

## 2023-06-08 NOTE — ED Triage Notes (Signed)
 BIB EMS from [facility], bleeding from PICC, SNF reports possible COVID, Bilateral  pitting Edema noted on legs,

## 2023-06-08 NOTE — ED Notes (Signed)
 Pt PICC line dressing has been changed.

## 2023-06-08 NOTE — Telephone Encounter (Signed)
  Bleeding at PICC exit site.  Pr resides at [facility] .Today , [name] was 'pulled out accidentally".   [facility]company came and reinserted PICC line.   Monet said that  pt is  actively bleeding from new PICC line insertion site . Pt already anemic.  He is  going to [facility] ED via EMS.  He will need blood transfusion at [facility].   Pt was scheduled to get blood transfusion tomorrow at [facility] cancer center ,but his appt at [facility] cancer center was cancelled.  Pt typed and screened yesterday  BB notified pt going to ED.   [facility] ED Charge nurse , Sheria Lang notified of pt coming by EMS.

## 2023-06-08 NOTE — ED Provider Notes (Signed)
 [place] EMERGENCY DEPARTMENT AT [facility] Provider Note   CSN: [id number] Arrival date & time: 06/08/23  1728     History  Chief Complaint  Patient presents with   Coagulation Disorder    Samuel Little is a 89 y.o. male.  Patient sent in from [facility].  Patient had a PICC line in his right upper extremity that Pulled out the PICC line nurse placed another 1.  Then he experienced bleeding from the insertion site.  Patient had a platelet transfusion yesterday known to have low platelets.  Known to have some problems with anemia.  Followed by Dr. Shirline Frees for that.  Patient is scheduled for a blood transfusion tomorrow.  Patient followed by them for pancytopenia of unclear etiology with negative bone marrow biopsy and aspirate.  The findings are suspicious for drug-induced versus autoimmune induced abnormality patient had extensive studies and they were unremarkable and his bone marrow biopsy and aspirate performed showed no underlying problems.  Patient last seen by oncology on the 26 which would have been yesterday.  Patient recently had a prolonged hospitalization.  Patient was hospitalized from March 10 March 17 for urinary tract infection to grew MRSA and was receiving IV antibiotics through PICC line which is to continue for a while yet and discharged to rehab facility as stated currently at [facility] has been having worsening thrombocytopenia thought to be drug-induced during his hospitalization he received platelet transfusion while in the hospital and received platelets as stated yesterday known to have an enlarged spleen as well.  Since being discharged from the hospital he had fatigue and weakness.  And has extremity swelling.  Patient being treated with Ancef post we can tell.  In addition there is been some concerns about possible COVID.  He tested positive at the facility for COVID but then has tested again negative.  Patient does have a cough       Home  Medications Prior to Admission medications   Medication Sig Start Date End Date Taking? Authorizing Provider  acetaminophen (TYLENOL) 650 MG CR tablet Take 1,300 mg every 8 (eight) hours as needed by mouth for pain.     [provider]  ceFAZolin (ANCEF) IVPB Inject 2 g into the vein every 8 (eight) hours. Indication:  MSSA Bacteremia First Dose: Yes Last Day of Therapy:  07/06/23 Labs - Once weekly:  CBC/D and BMP, Labs - Once weekly: ESR and CRP 05/29/23 07/06/23  Jonah Blue, MD  docusate sodium (COLACE) 100 MG capsule Take 1 capsule (100 mg total) by mouth 2 (two) times daily. 05/29/23   Jonah Blue, MD  doxazosin (CARDURA) 4 MG tablet Take 4 mg by mouth at bedtime.    [provider]  gabapentin (NEURONTIN) 100 MG capsule Take 1 capsule (100 mg total) by mouth at bedtime. 05/29/23   Jonah Blue, MD  levothyroxine (SYNTHROID) 150 MCG tablet Take 150 mcg by mouth daily before breakfast.    [provider]  lidocaine (LIDODERM) 5 % Place 1-3 patches onto the skin daily. Remove & Discard patch within 12 hours or as directed by MD 05/30/23   Jonah Blue, MD  lisinopril (ZESTRIL) 30 MG tablet Take 30 mg by mouth daily.    [provider]  Menthol-Methyl Salicylate (MUSCLE RUB) 10-15 % CREA Apply 1 Application topically as needed for muscle pain. 05/29/23   Jonah Blue, MD  methocarbamol (ROBAXIN) 500 MG tablet Take 1 tablet (500 mg total) by mouth 4 (four) times daily.  05/29/23   Jonah Blue, MD  naphazoline-glycerin (CLEAR EYES REDNESS) 0.012-0.25 % SOLN Place 1-2 drops into both eyes 4 (four) times daily as needed for eye irritation. 05/29/23   Jonah Blue, MD  oxyCODONE (OXY IR/ROXICODONE) 5 MG immediate release tablet Take 1 tablet (5 mg total) by mouth every 4 (four) hours as needed for severe pain (pain score 7-10) or moderate pain (pain score 4-6). 05/29/23   Jonah Blue, MD  polyethylene glycol (MIRALAX / GLYCOLAX) 17 g packet Take  17 g by mouth daily. 05/30/23   Jonah Blue, MD  predniSONE (DELTASONE) 10 MG tablet Take 2 tablets (20 mg total) by mouth daily with breakfast. Take 2 tablets daily x 2 weeks 05/05/23   Heilingoetter, Cassandra L, PA-C      Allergies    Nsaids, Celecoxib, and Meperidine hcl    Review of Systems   Review of Systems  Unable to perform ROS: Mental status change    Physical Exam Updated Vital Signs BP 138/61   Pulse 67   Temp (!) 97.4 F (36.3 C) (Oral)   Resp (!) 22   Ht 1.829 m (6')   Wt 83.9 kg   SpO2 98%   BMI 25.09 kg/m  Physical Exam Vitals and nursing note reviewed.  Constitutional:      General: He is not in acute distress.    Appearance: He is well-developed.  HENT:     Head: Normocephalic and atraumatic.  Eyes:     Conjunctiva/sclera: Conjunctivae normal.  Cardiovascular:     Rate and Rhythm: Normal rate and regular rhythm.     Heart sounds: No murmur heard. Pulmonary:     Effort: Pulmonary effort is normal. No respiratory distress.     Breath sounds: Rhonchi present.  Abdominal:     Palpations: Abdomen is soft.     Tenderness: There is no abdominal tenderness.  Musculoskeletal:        General: No swelling.     Cervical back: Neck supple.     Right lower leg: Edema present.     Left lower leg: Edema present.  Skin:    General: Skin is warm and dry.     Capillary Refill: Capillary refill takes less than 2 seconds.  Neurological:     Mental Status: He is alert.  Psychiatric:        Mood and Affect: Mood normal.     ED Results / Procedures / Treatments   Labs (all labs ordered are listed, but only abnormal results are displayed) Labs Reviewed  RESP PANEL BY RT-PCR (RSV, FLU A&B, COVID)  RVPGX2 - Abnormal; Notable for the following components:      Result Value   SARS Coronavirus 2 by RT PCR POSITIVE (*)    All other components within normal limits  CBC WITH DIFFERENTIAL/PLATELET - Abnormal; Notable for the following components:   WBC 2.4 (*)     RBC 1.79 (*)    Hemoglobin 6.2 (*)    HCT 19.4 (*)    MCV 108.4 (*)    MCH 34.6 (*)    RDW 22.2 (*)    Platelets 18 (*)    nRBC 2.1 (*)    All other components within normal limits  COMPREHENSIVE METABOLIC PANEL WITH GFR - Abnormal; Notable for the following components:   Sodium 133 (*)    CO2 21 (*)    Glucose, Bld 141 (*)    BUN 58 (*)    Calcium 8.3 (*)    Total  Protein 6.0 (*)    Albumin 1.9 (*)    AST 45 (*)    Alkaline Phosphatase 189 (*)    All other components within normal limits  TYPE AND SCREEN  PREPARE RBC (CROSSMATCH)    EKG None  Radiology DG Chest Port 1 View Result Date: 06/08/2023 CLINICAL DATA:  Cough, bleeding from PICC, possible COVID. Bilateral pitting edema in legs. EXAM: PORTABLE CHEST 1 VIEW COMPARISON:  10/27/2022. FINDINGS: Heart is enlarged and the mediastinal contour is within normal limits. There is atherosclerotic calcification of the aorta. There is mild airspace disease at the left lung base with a chronic left pleural effusion. The right lung is clear. A stable opacity is noted in the left upper lobe, which may represent overlapping structures and prominence of the first rib on the left. No pneumothorax. A left-sided PICC line terminates over the superior vena cava. IMPRESSION: Small left pleural effusion with atelectasis or infiltrate at the left lung base. Electronically Signed   By: Thornell Sartorius M.D.   On: 06/08/2023 20:09    Procedures Procedures    Medications Ordered in ED Medications  iohexol (OMNIPAQUE) 300 MG/ML solution 75 mL (has no administration in time range)  0.9 %  sodium chloride infusion (Manually program via Guardrails IV Fluids) (has no administration in time range)    ED Course/ Medical Decision Making/ A&P                                 Medical Decision Making Amount and/or Complexity of Data Reviewed Labs: ordered. Radiology: ordered.  Risk Prescription drug management. Decision regarding  hospitalization.  Patient's CBC white count 2.4 hemoglobin 6.2 platelets 18,000.  Based on this patient will need blood transfusion probably platelet transfusion.  Ordered 2 units of blood to be transfused.  Complete metabolic panel sodium 133 CO2 21 glucose 141 renal functions normal alk phos is markedly evaded 189.  Patient's respiratory panel positive for COVID.  But patient not terribly symptomatic.  Chest x-ray slight left pleural effusion atelectasis or infiltrate left lung base.  Have ordered CT scan of the chest to further delineate this.  Patient's oxygen saturations are good.  Patient followed by Dr. Shirline Frees from oncology.  Discussed with hospitalist for admission.  CRITICAL CARE Performed by: Vanetta Mulders Total critical care time: 45 minutes Critical care time was exclusive of separately billable procedures and treating other patients. Critical care was necessary to treat or prevent imminent or life-threatening deterioration. Critical care was time spent personally by me on the following activities: development of treatment plan with patient and/or surrogate as well as nursing, discussions with consultants, evaluation of patient's response to treatment, examination of patient, obtaining history from patient or surrogate, ordering and performing treatments and interventions, ordering and review of laboratory studies, ordering and review of radiographic studies, pulse oximetry and re-evaluation of patient's condition.   Final Clinical Impression(s) / ED Diagnoses Final diagnoses:  COVID  Anemia, unspecified type  Thrombocytopenia [facility])    Rx / DC Orders ED Discharge Orders     None         Vanetta Mulders, MD 06/08/23 2151

## 2023-06-08 NOTE — Telephone Encounter (Signed)
 He tested + for COVID today at [facility]. It was a routine COVID test .   Ellard Artis , Nursing director said he is asymptomatic.

## 2023-06-09 ENCOUNTER — Observation Stay (HOSPITAL_BASED_OUTPATIENT_CLINIC_OR_DEPARTMENT_OTHER)

## 2023-06-09 ENCOUNTER — Encounter

## 2023-06-09 ENCOUNTER — Encounter (HOSPITAL_COMMUNITY): Payer: Self-pay | Admitting: Internal Medicine

## 2023-06-09 DIAGNOSIS — B9561 Methicillin susceptible Staphylococcus aureus infection as the cause of diseases classified elsewhere: Secondary | ICD-10-CM

## 2023-06-09 DIAGNOSIS — R609 Edema, unspecified: Secondary | ICD-10-CM

## 2023-06-09 DIAGNOSIS — U071 COVID-19: Secondary | ICD-10-CM | POA: Diagnosis not present

## 2023-06-09 DIAGNOSIS — D696 Thrombocytopenia, unspecified: Secondary | ICD-10-CM

## 2023-06-09 DIAGNOSIS — D509 Iron deficiency anemia, unspecified: Secondary | ICD-10-CM

## 2023-06-09 DIAGNOSIS — L8990 Pressure ulcer of unspecified site, unspecified stage: Secondary | ICD-10-CM

## 2023-06-09 DIAGNOSIS — D62 Acute posthemorrhagic anemia: Secondary | ICD-10-CM

## 2023-06-09 DIAGNOSIS — D649 Anemia, unspecified: Secondary | ICD-10-CM

## 2023-06-09 DIAGNOSIS — R7881 Bacteremia: Secondary | ICD-10-CM

## 2023-06-09 DIAGNOSIS — R600 Localized edema: Secondary | ICD-10-CM | POA: Diagnosis not present

## 2023-06-09 DIAGNOSIS — T82838A Hemorrhage of vascular prosthetic devices, implants and grafts, initial encounter: Secondary | ICD-10-CM | POA: Diagnosis not present

## 2023-06-09 DIAGNOSIS — I10 Essential (primary) hypertension: Secondary | ICD-10-CM

## 2023-06-09 LAB — CBC WITH DIFFERENTIAL/PLATELET
Abs Immature Granulocytes: 0.26 10*3/uL — ABNORMAL HIGH (ref 0.00–0.07)
Basophils Absolute: 0 10*3/uL (ref 0.0–0.1)
Basophils Relative: 0 %
Eosinophils Absolute: 0 10*3/uL (ref 0.0–0.5)
Eosinophils Relative: 0 %
HCT: 25.8 % — ABNORMAL LOW (ref 39.0–52.0)
Hemoglobin: 8.5 g/dL — ABNORMAL LOW (ref 13.0–17.0)
Immature Granulocytes: 8 %
Lymphocytes Relative: 34 %
Lymphs Abs: 1.2 10*3/uL (ref 0.7–4.0)
MCH: 33.9 pg (ref 26.0–34.0)
MCHC: 32.9 g/dL (ref 30.0–36.0)
MCV: 102.8 fL — ABNORMAL HIGH (ref 80.0–100.0)
Monocytes Absolute: 1.5 10*3/uL — ABNORMAL HIGH (ref 0.1–1.0)
Monocytes Relative: 2 %
Neutro Abs: 0.5 10*3/uL — ABNORMAL LOW (ref 1.7–7.7)
Neutrophils Relative %: 15 %
Other: 41 %
Platelets: 17 10*3/uL — CL (ref 150–400)
RBC: 2.51 MIL/uL — ABNORMAL LOW (ref 4.22–5.81)
RDW: 21.5 % — ABNORMAL HIGH (ref 11.5–15.5)
WBC: 3.5 10*3/uL — ABNORMAL LOW (ref 4.0–10.5)
nRBC: 2.6 % — ABNORMAL HIGH (ref 0.0–0.2)

## 2023-06-09 LAB — BPAM RBC
Blood Product Expiration Date: 202504272359
Blood Product Expiration Date: 202504272359
Unit Type and Rh: 5100
Unit Type and Rh: 5100

## 2023-06-09 LAB — PROTIME-INR
INR: 1.3 — ABNORMAL HIGH (ref 0.8–1.2)
Prothrombin Time: 15.9 s — ABNORMAL HIGH (ref 11.4–15.2)

## 2023-06-09 LAB — SURGICAL PATHOLOGY

## 2023-06-09 LAB — TYPE AND SCREEN
ABO/RH(D): O POS
Antibody Screen: NEGATIVE
Unit division: 0
Unit division: 0

## 2023-06-09 LAB — LACTATE DEHYDROGENASE: LDH: 136 U/L (ref 98–192)

## 2023-06-09 LAB — CBC
HCT: 19.9 % — ABNORMAL LOW (ref 39.0–52.0)
Hemoglobin: 6.3 g/dL — CL (ref 13.0–17.0)
MCH: 34.4 pg — ABNORMAL HIGH (ref 26.0–34.0)
MCHC: 31.7 g/dL (ref 30.0–36.0)
MCV: 108.7 fL — ABNORMAL HIGH (ref 80.0–100.0)
Platelets: 19 10*3/uL — CL (ref 150–400)
RBC: 1.83 MIL/uL — ABNORMAL LOW (ref 4.22–5.81)
RDW: 22.1 % — ABNORMAL HIGH (ref 11.5–15.5)
WBC: 2.6 10*3/uL — ABNORMAL LOW (ref 4.0–10.5)
nRBC: 3.4 % — ABNORMAL HIGH (ref 0.0–0.2)

## 2023-06-09 LAB — COMPREHENSIVE METABOLIC PANEL WITH GFR
ALT: 10 U/L (ref 0–44)
AST: 43 U/L — ABNORMAL HIGH (ref 15–41)
Albumin: 1.8 g/dL — ABNORMAL LOW (ref 3.5–5.0)
Alkaline Phosphatase: 196 U/L — ABNORMAL HIGH (ref 38–126)
Anion gap: 5 (ref 5–15)
BUN: 60 mg/dL — ABNORMAL HIGH (ref 8–23)
CO2: 23 mmol/L (ref 22–32)
Calcium: 8.2 mg/dL — ABNORMAL LOW (ref 8.9–10.3)
Chloride: 105 mmol/L (ref 98–111)
Creatinine, Ser: 1.09 mg/dL (ref 0.61–1.24)
GFR, Estimated: >60 mL/min (ref >60)
Glucose, Bld: 139 mg/dL — ABNORMAL HIGH (ref 70–99)
Potassium: 4.5 mmol/L (ref 3.5–5.1)
Sodium: 133 mmol/L — ABNORMAL LOW (ref 135–145)
Total Bilirubin: 1.2 mg/dL (ref 0.0–1.2)
Total Protein: 5.7 g/dL — ABNORMAL LOW (ref 6.5–8.1)

## 2023-06-09 LAB — PREPARE RBC (CROSSMATCH)

## 2023-06-09 LAB — DIRECT ANTIGLOBULIN TEST (NOT AT ARMC)
DAT, IgG: NEGATIVE
DAT, complement: NEGATIVE

## 2023-06-09 LAB — TSH: TSH: 7.82 u[IU]/mL — ABNORMAL HIGH (ref 0.350–4.500)

## 2023-06-09 LAB — PATHOLOGIST SMEAR REVIEW

## 2023-06-09 MED ORDER — FUROSEMIDE 10 MG/ML IJ SOLN
20.0000 mg | Freq: Two times a day (BID) | INTRAMUSCULAR | Status: DC
  Administered 2023-06-09 – 2023-06-10 (×2): 20 mg via INTRAVENOUS
  Filled 2023-06-09 (×2): qty 2

## 2023-06-09 MED ORDER — PREDNISONE 20 MG PO TABS
20.0000 mg | ORAL_TABLET | Freq: Every day | ORAL | Status: DC
  Administered 2023-06-09 – 2023-06-11 (×3): 20 mg via ORAL
  Filled 2023-06-09 (×3): qty 1

## 2023-06-09 MED ORDER — PREDNISONE 20 MG PO TABS: 20.0000 mg | ORAL_TABLET | Freq: Every day | ORAL | Status: DC

## 2023-06-09 MED ORDER — LEVOTHYROXINE SODIUM 50 MCG PO TABS
150.0000 ug | ORAL_TABLET | Freq: Every day | ORAL | Status: DC
  Administered 2023-06-09 – 2023-06-11 (×3): 150 ug via ORAL
  Filled 2023-06-09 (×3): qty 1

## 2023-06-09 MED ORDER — DOCUSATE SODIUM 100 MG PO CAPS
100.0000 mg | ORAL_CAPSULE | Freq: Two times a day (BID) | ORAL | Status: DC
  Administered 2023-06-09 – 2023-06-10 (×3): 100 mg via ORAL
  Filled 2023-06-09 (×3): qty 1

## 2023-06-09 MED ORDER — DOXAZOSIN MESYLATE 4 MG PO TABS
4.0000 mg | ORAL_TABLET | Freq: Every day | ORAL | Status: DC
  Administered 2023-06-09 – 2023-06-10 (×3): 4 mg via ORAL
  Filled 2023-06-09 (×3): qty 1

## 2023-06-09 MED ORDER — LIDOCAINE 5 % EX PTCH
1.0000 | MEDICATED_PATCH | CUTANEOUS | Status: DC
  Administered 2023-06-09 – 2023-06-11 (×3): 1 via TRANSDERMAL
  Filled 2023-06-09 (×3): qty 1

## 2023-06-09 MED ORDER — SODIUM CHLORIDE 0.9% FLUSH: 10.0000 mL | INTRAVENOUS | Status: DC | PRN

## 2023-06-09 MED ORDER — OXYCODONE HCL 5 MG PO TABS
5.0000 mg | ORAL_TABLET | ORAL | Status: DC | PRN
  Administered 2023-06-09 – 2023-06-10 (×3): 5 mg via ORAL
  Filled 2023-06-09 (×3): qty 1

## 2023-06-09 MED ORDER — NAPHAZOLINE-GLYCERIN 0.012-0.25 % OP SOLN: 1.0000 [drp] | Freq: Four times a day (QID) | OPHTHALMIC | Status: DC | PRN

## 2023-06-09 MED ORDER — CEFAZOLIN SODIUM-DEXTROSE 2-4 GM/100ML-% IV SOLN
2.0000 g | Freq: Three times a day (TID) | INTRAVENOUS | Status: DC
  Administered 2023-06-09 – 2023-06-10 (×4): 2 g via INTRAVENOUS
  Filled 2023-06-09 (×5): qty 100

## 2023-06-09 MED ORDER — FUROSEMIDE 10 MG/ML IJ SOLN
20.0000 mg | Freq: Once | INTRAMUSCULAR | Status: AC
  Administered 2023-06-09: 20 mg via INTRAVENOUS
  Filled 2023-06-09: qty 2

## 2023-06-09 MED ORDER — SODIUM CHLORIDE 0.9% IV SOLUTION: Freq: Once | INTRAVENOUS | Status: AC

## 2023-06-09 MED ORDER — GABAPENTIN 100 MG PO CAPS
100.0000 mg | ORAL_CAPSULE | Freq: Every day | ORAL | Status: DC
  Administered 2023-06-09 – 2023-06-10 (×3): 100 mg via ORAL
  Filled 2023-06-09 (×3): qty 1

## 2023-06-09 MED ORDER — CEFAZOLIN SODIUM-DEXTROSE 2-4 GM/100ML-% IV SOLN
2.0000 g | INTRAVENOUS | Status: AC
  Administered 2023-06-09: 2 g via INTRAVENOUS
  Filled 2023-06-09: qty 100

## 2023-06-09 MED ORDER — METHOCARBAMOL 500 MG PO TABS
500.0000 mg | ORAL_TABLET | Freq: Four times a day (QID) | ORAL | Status: DC
Start: 2023-06-09 — End: 2023-06-12
  Administered 2023-06-09 – 2023-06-11 (×10): 500 mg via ORAL
  Filled 2023-06-09 (×10): qty 1

## 2023-06-09 MED ORDER — PREDNISONE 10 MG PO TABS: 10.0000 mg | ORAL_TABLET | Freq: Every day | ORAL | Status: DC

## 2023-06-09 MED ORDER — HEPARIN SOD (PORK) LOCK FLUSH 100 UNIT/ML IV SOLN: 300.0000 [IU] | Freq: Three times a day (TID) | INTRAVENOUS | Status: DC

## 2023-06-09 MED ORDER — ADULT MULTIVITAMIN W/MINERALS CH
1.0000 | ORAL_TABLET | Freq: Every day | ORAL | Status: DC
  Administered 2023-06-09 – 2023-06-10 (×2): 1 via ORAL
  Filled 2023-06-09 (×2): qty 1

## 2023-06-09 MED ORDER — LISINOPRIL 10 MG PO TABS
30.0000 mg | ORAL_TABLET | Freq: Every day | ORAL | Status: DC
  Administered 2023-06-09: 30 mg via ORAL
  Filled 2023-06-09: qty 3

## 2023-06-09 MED ORDER — CHLORHEXIDINE GLUCONATE CLOTH 2 % EX PADS
6.0000 | MEDICATED_PAD | Freq: Every day | CUTANEOUS | Status: DC
  Administered 2023-06-09 – 2023-06-11 (×3): 6 via TOPICAL

## 2023-06-09 MED ORDER — ENSURE ENLIVE PO LIQD
237.0000 mL | Freq: Two times a day (BID) | ORAL | Status: DC
  Administered 2023-06-09 – 2023-06-11 (×5): 237 mL via ORAL

## 2023-06-09 MED ORDER — CEFAZOLIN IV (FOR PTA / DISCHARGE USE ONLY): 2.0000 g | Freq: Three times a day (TID) | INTRAVENOUS | Status: DC

## 2023-06-09 NOTE — Hospital Course (Signed)
 HPI:  Samuel Little is a 89 y.o. 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  Patient had dislodged [name] of the right upper extremity  2 days ago and a new one was placed on the left upper extremity.  Earlier yesterday patient was noticed to increasing bleeding from the PICC line insertion site and was referred to the ER.  No bleeding from other sites.  No bleeding from the mouth or urine.  2 days ago patient had followed up with Dr. Arbutus Ped oncologist and had received platelet transfusion for low platelets.  Patient otherwise denies any chest pain shortness of breath nausea vomiting diarrhea and is able to ambulate with help of physical therapy.   ED Course: On exam patient has significant peripheral edema.  In the ER after placing pressure the bleeding was able to be stopped.  Labs show hemoglobin of 6.2 which is decreased from 7 two days ago and platelets of 18.  2 units of PRBC transfusion was ordered.  CT chest shows mild bilateral pleural effusion and also signs of cirrhosis of the liver with splenomegaly.  INR is 1.3.  Significant Events: Admitted 06/08/2023 acute blood loss anemia   Significant Labs: Covid POSITIVE Na 133, K 4.4, CO2 of 21, BUN 58, Scr 1.05, glu 141 WBC 2.4, Hgb 6.2, plt 18  Significant Imaging Studies: CXR Small left pleural effusion with atelectasis or infiltrate at the left lung base.  left-sided PICC line terminates over the superior vena cava.  CT chest Small bilateral pleural effusions, decreased since prior CT. 2. Linear atelectasis in the left lung base. 3. Interstitial pattern  to the lung bases likely represents mild edema. 4. Cardiac enlargement. 5. Aortic atherosclerosis.  6. Hepatic cirrhosis with splenic enlargement and upper abdominal ascites. 7. Nonobstructing stones in the kidneys  Antibiotic Therapy: Anti-infectives (From admission, onward)    Start     Dose/Rate Route Frequency Ordered Stop   06/09/23 0800  ceFAZolin (ANCEF) IVPB 2g/100 mL premix        2 g 200 mL/hr over 30 Minutes Intravenous Every 8 hours 06/09/23 0027     06/09/23 0115  ceFAZolin (ANCEF) IVPB 2g/100 mL premix        2 g 200 mL/hr over 30 Minutes Intravenous NOW 06/09/23 0027 06/09/23 0146   06/09/23 0015  ceFAZolin (ANCEF) IVPB  Status:  Discontinued       Note to Pharmacy: Indication:  MSSA Bacteremia First Dose: Yes Last Day of Therapy:  07/06/23 Labs - Once weekly:  CBC/D and BMP, Labs - Once weekly: ESR and CRP     2 g Intravenous Every 8 hours 06/09/23 0011 06/09/23 0027       Procedures:   Consultants: oncology

## 2023-06-09 NOTE — Assessment & Plan Note (Signed)
 Present on admission.  Pressure Injury 06/09/23 Buttocks Right;Left;Mid Stage 2 -  Partial thickness loss of dermis presenting as a shallow open injury with a red, pink wound bed without slough. (Active)  06/09/23 0137  Location: Buttocks  Location Orientation: Right;Left;Mid  Staging: Stage 2 -  Partial thickness loss of dermis presenting as a shallow open injury with a red, pink wound bed without slough.  Wound Description (Comments):   Present on Admission: Yes

## 2023-06-09 NOTE — H&P (Signed)
 History and Physical    Samuel Little:[id number] DOB: 1932-11-14 DOA: 06/08/2023  Patient coming from: Skilled nursing facility.  Chief Complaint: Bleeding from the PICC line site.  HPI: [name] is a 89 y.o. 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  Patient had dislodged [name] of the right upper extremity  2 days ago and a new one was placed on the left upper extremity.  Earlier yesterday patient was noticed to increasing bleeding from the PICC line insertion site and was referred to the ER.  No bleeding from other sites.  No bleeding from the mouth or urine.  2 days ago patient had followed up with Dr. Arbutus Ped oncologist and had received platelet transfusion for low platelets.  Patient otherwise denies any chest pain shortness of breath nausea vomiting diarrhea and is able to ambulate with help of physical therapy.  ED Course: On exam patient has significant peripheral edema.  In the ER after placing pressure the bleeding was able to be stopped.  Labs show hemoglobin of 6.2 which is decreased from 7 two days ago and platelets of 18.  2 units of PRBC transfusion was ordered.  CT chest shows mild bilateral pleural effusion and also signs of cirrhosis of the liver with splenomegaly.  INR is 1.3.  Review of Systems: As per HPI, rest all negative.   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 , march 2,2011    Arthritis    Diverticulosis    Effusion, pericardium    Gastroesophageal reflux  disease    Gout    Hard of hearing    wears bilateral hearing aids   History of kidney stones    Hypertension    Hypothyroidism    Macular degeneration    Pleural effusion, left    Right bundle branch block    Thyroid disease    hypothyroidism   Wears glasses     Past Surgical History:  Procedure Laterality Date   ARTHROSCOPY KNEE W/ DRILLING Right    CARDIAC CATHETERIZATION  2007   CARDIAC ELECTROPHYSIOLOGY STUDY AND ABLATION     CATARACT EXTRACTION     CHEST TUBE INSERTION     CHOLECYSTECTOMY  06/26/2016   Procedure: LAPAROSCOPIC CHOLECYSTECTOMY  subtotal;  Surgeon: Almond Lint, MD;  Location: [facility] OR;  Service: General;;   FRACTURE SURGERY Right 2001   right leg   HERNIA REPAIR     IR THORACENTESIS ASP PLEURAL SPACE W/IMG GUIDE  09/09/2022   MASS EXCISION Right 09/22/2015   Procedure: RIGHT THUMB EXCISION MASS;  Surgeon: Betha Loa, MD;  Location:  SUR[facility]ERY CENTER;  Service: Orthopedics;  Laterality: Right;   ROTATOR CUFF REPAIR     ROTATOR CUFF REPAIR Right 2002   TONSILLECTOMY     TOTAL KNEE ARTHROPLASTY Right 02/13/2017   Procedure: TOTAL KNEE ARTHROPLASTY;  Surgeon: Dannielle Huh, MD;  Location: [facility] OR;  Service: Orthopedics;  Laterality: Right;     reports that he has never smoked. He has never used smokeless tobacco. He reports that he  does not drink alcohol and does not use drugs.  Allergies  Allergen Reactions   Nsaids Other (See Comments)    JAUNDICE   Celecoxib Nausea And Vomiting   Meperidine Hcl Nausea Only    Family History  Problem Relation Age of Onset   Hypertension Mother    Diabetes Mother    Hypertension Father    Heart attack Father    Diabetes Father    Heart attack Brother    Neuropathy Brother    Heart attack Paternal Uncle    Heart attack Paternal Uncle    Heart attack Paternal Uncle    Other Son        Leak in aorta    Prior to Admission medications   Medication Sig Start Date End Date Taking? Authorizing Provider   acetaminophen (TYLENOL) 650 MG CR tablet Take 1,300 mg every 8 (eight) hours as needed by mouth for pain.    Yes [provider]  ascorbic acid (VITAMIN C) 500 MG tablet Take 500 mg by mouth 2 (two) times daily.   Yes [provider]  bisacodyl (DULCOLAX) 10 MG suppository Place 10 mg rectally daily as needed for moderate constipation.   Yes [provider]  ceFAZolin (ANCEF) IVPB Inject 2 g into the vein every 8 (eight) hours. Indication:  MSSA Bacteremia First Dose: Yes Last Day of Therapy:  07/06/23 Labs - Once weekly:  CBC/D and BMP, Labs - Once weekly: ESR and CRP 05/29/23 07/06/23 Yes Jonah Blue, MD  docusate sodium (COLACE) 100 MG capsule Take 1 capsule (100 mg total) by mouth 2 (two) times daily. 05/29/23  Yes Jonah Blue, MD  doxazosin (CARDURA) 4 MG tablet Take 4 mg by mouth at bedtime.   Yes [provider]  feeding supplement (ENSURE ENLIVE / ENSURE PLUS) LIQD Take 237 mLs by mouth 2 (two) times daily between meals.   Yes [provider]  gabapentin (NEURONTIN) 100 MG capsule Take 1 capsule (100 mg total) by mouth at bedtime. 05/29/23  Yes Jonah Blue, MD  heparin lock flush 100 UNIT/ML SOLN injection Inject 3 mLs into the vein every 8 (eight) hours. For Patency of PICC line after IV ABT & NS to right upper arm   Yes [provider]  levothyroxine (SYNTHROID) 150 MCG tablet Take 150 mcg by mouth daily before breakfast.   Yes [provider]  lidocaine (LIDODERM) 5 % Place 1-3 patches onto the skin daily. Remove & Discard patch within 12 hours or as directed by MD 05/30/23  Yes Jonah Blue, MD  lisinopril (ZESTRIL) 30 MG tablet Take 30 mg by mouth daily.   Yes [provider]  magnesium hydroxide (MILK OF MAGNESIA) 400 MG/5ML suspension Take 30 mLs by mouth daily as needed for mild constipation.   Yes [provider]  methocarbamol (ROBAXIN) 500 MG tablet Take 1 tablet (500 mg total) by mouth 4  (four) times daily. 05/29/23  Yes Jonah Blue, MD  mineral oil enema Place 1 enema rectally daily as needed for severe constipation.   Yes [provider]  Multiple Vitamin (MULTIVITAMIN WITH MINERALS) TABS tablet Take 1 tablet by mouth daily.   Yes [provider]  naphazoline-glycerin (CLEAR EYES REDNESS) 0.012-0.25 % SOLN Place 1-2 drops into both eyes 4 (four) times daily as needed for eye irritation. 05/29/23  Yes Jonah Blue, MD  oxyCODONE (OXY IR/ROXICODONE) 5 MG immediate release tablet Take 1 tablet (5 mg total) by mouth every 4 (four) hours as needed for  severe pain (pain score 7-10) or moderate pain (pain score 4-6). 05/29/23  Yes Jonah Blue, MD  Pollen Extracts (PROSTAT PO) Take 30 mLs by mouth in the morning and at bedtime.   Yes [provider]  polyethylene glycol (MIRALAX / GLYCOLAX) 17 g packet Take 17 g by mouth daily. 05/30/23  Yes Jonah Blue, MD  predniSONE (DELTASONE) 10 MG tablet Take 2 tablets (20 mg total) by mouth daily with breakfast. Take 2 tablets daily x 2 weeks 05/05/23  Yes Heilingoetter, Cassandra L, PA-C  sodium chloride 0.9 % infusion Inject 10 mLs into the vein every 8 (eight) hours. For Patency of PICC line to Right Upper Arm Before and After IV ABT   Yes [provider]    Physical Exam: Constitutional: Moderately built and nourished. Vitals:   06/08/23 1811 06/08/23 1815 06/08/23 1829 06/08/23 2306  BP:  138/61    Pulse: 68 67    Resp: (!) 22 (!) 22    Temp: (!) 97.4 F (36.3 C)   97.6 F (36.4 C)  TempSrc: Oral   Oral  SpO2: 98% 98%    Weight:   83.9 kg   Height:   6' (1.829 m)    Eyes: Anicteric no pallor. ENMT: No discharge from the ears eyes nose or mouth. Neck: No mass felt.  No neck rigidity. Respiratory: No rhonchi or crepitations. Cardiovascular: S1-S2 heard. Abdomen: Soft nontender bowel sound present. Musculoskeletal: Bilateral upper and lower extremity edema. Skin: Chronic skin  changes. Neurologic: Alert awake oriented to time place and person.  Moves all extremities. Psychiatric: Appears normal.  Normal affect.   Labs on Admission: I have personally reviewed following labs and imaging studies  CBC: Recent Labs  Lab 06/07/23 1119 06/08/23 2019  WBC 5.2 2.4*  NEUTROABS 0.7* 0.8*  HGB 7.0* 6.2*  HCT 20.9* 19.4*  MCV 103.5* 108.4*  PLT 20* 18*   Basic Metabolic Panel: Recent Labs  Lab 06/07/23 1119 06/08/23 2019  NA 134* 133*  K 4.1 4.4  CL 105 106  CO2 24 21*  GLUCOSE 103* 141*  BUN 43* 58*  CREATININE 0.92 1.05  CALCIUM 8.4* 8.3*   GFR: Estimated Creatinine Clearance: 52.3 mL/min (by C-G formula based on SCr of 1.05 mg/dL). Liver Function Tests: Recent Labs  Lab 06/07/23 1119 06/08/23 2019  AST 39 45*  ALT 6 10  ALKPHOS 229* 189*  BILITOT 1.3* 1.0  PROT 6.3* 6.0*  ALBUMIN 2.2* 1.9*   No results for input(s): "LIPASE", "AMYLASE" in the last 168 hours. No results for input(s): "AMMONIA" in the last 168 hours. Coagulation Profile: No results for input(s): "INR", "PROTIME" in the last 168 hours. Cardiac Enzymes: No results for input(s): "CKTOTAL", "CKMB", "CKMBINDEX", "TROPONINI" in the last 168 hours. BNP (last 3 results) No results for input(s): "PROBNP" in the last 8760 hours. HbA1C: No results for input(s): "HGBA1C" in the last 72 hours. CBG: No results for input(s): "GLUCAP" in the last 168 hours. Lipid Profile: No results for input(s): "CHOL", "HDL", "LDLCALC", "TRIG", "CHOLHDL", "LDLDIRECT" in the last 72 hours. Thyroid Function Tests: No results for input(s): "TSH", "T4TOTAL", "FREET4", "T3FREE", "THYROIDAB" in the last 72 hours. Anemia Panel: No results for input(s): "VITAMINB12", "FOLATE", "FERRITIN", "TIBC", "IRON", "RETICCTPCT" in the last 72 hours. Urine analysis:    Component Value Date/Time   COLORURINE AMBER (A) 05/22/2023 2040   APPEARANCEUR HAZY (A) 05/22/2023 2040   LABSPEC 1.012 05/22/2023 2040   PHURINE  5.0 05/22/2023 2040   GLUCOSEU NEGATIVE 05/22/2023  2040   HGBUR MODERATE (A) 05/22/2023 2040   BILIRUBINUR NEGATIVE 05/22/2023 2040   KETONESUR NEGATIVE 05/22/2023 2040   PROTEINUR NEGATIVE 05/22/2023 2040   UROBILINOGEN 0.2 12/18/2013 0036   NITRITE NEGATIVE 05/22/2023 2040   LEUKOCYTESUR MODERATE (A) 05/22/2023 2040   Sepsis Labs: @LABRCNTIP (procalcitonin:4,lacticidven:4) ) Recent Results (from the past 240 hours)  Resp panel by RT-PCR (RSV, Flu A&B, Covid) Anterior Nasal Swab     Status: Abnormal   Collection Time: 06/08/23  7:01 PM   Specimen: Anterior Nasal Swab  Result Value Ref Range Status   SARS Coronavirus 2 by RT PCR POSITIVE (A) NEGATIVE Final    Comment: (NOTE) SARS-CoV-2 target nucleic acids are DETECTED.  The SARS-CoV-2 RNA is generally detectable in upper respiratory specimens during the acute phase of infection. Positive results are indicative of the presence of the identified virus, but do not rule out bacterial infection or co-infection with other pathogens not detected by the test. Clinical correlation with patient history and other diagnostic information is necessary to determine patient infection status. The expected result is Negative.  Fact Sheet for Patients: BloggerCourse.com  Fact Sheet for Healthcare Providers: SeriousBroker.it  This test is not yet approved or cleared by the [place] FDA and  has been authorized for detection and/or diagnosis of SARS-CoV-2 by FDA under an Emergency Use Authorization (EUA).  This EUA will remain in effect (meaning this test can be used) for the duration of  the COVID-19 declaration under Section 564(b)(1) of the A ct, 21 U.S.C. section 360bbb-3(b)(1), unless the authorization is terminated or revoked sooner.     Influenza A by PCR NEGATIVE NEGATIVE Final   Influenza B by PCR NEGATIVE NEGATIVE Final    Comment: (NOTE) The Xpert Xpress SARS-CoV-2/FLU/RSV  plus assay is intended as an aid in the diagnosis of influenza from Nasopharyngeal swab specimens and should not be used as a sole basis for treatment. Nasal washings and aspirates are unacceptable for Xpert Xpress SARS-CoV-2/FLU/RSV testing.  Fact Sheet for Patients: BloggerCourse.com  Fact Sheet for Healthcare Providers: SeriousBroker.it  This test is not yet approved or cleared by the [place] FDA and has been authorized for detection and/or diagnosis of SARS-CoV-2 by FDA under an Emergency Use Authorization (EUA). This EUA will remain in effect (meaning this test can be used) for the duration of the COVID-19 declaration under Section 564(b)(1) of the Act, 21 U.S.C. section 360bbb-3(b)(1), unless the authorization is terminated or revoked.     Resp Syncytial Virus by PCR NEGATIVE NEGATIVE Final    Comment: (NOTE) Fact Sheet for Patients: BloggerCourse.com  Fact Sheet for Healthcare Providers: SeriousBroker.it  This test is not yet approved or cleared by the [place] FDA and has been authorized for detection and/or diagnosis of SARS-CoV-2 by FDA under an Emergency Use Authorization (EUA). This EUA will remain in effect (meaning this test can be used) for the duration of the COVID-19 declaration under Section 564(b)(1) of the Act, 21 U.S.C. section 360bbb-3(b)(1), unless the authorization is terminated or revoked.  Performed at [facility], [address], [place], [place] [address]      Radiological Exams on Admission: CT Chest W Contrast Result Date: 06/08/2023 CLINICAL DATA:  Pneumonia with complication suspected. X-ray done. Respiratory illness with nondiagnostic x-ray. Possible COVID. Bilateral pitting edema. EXAM: CT CHEST WITH CONTRAST TECHNIQUE: Multidetector CT imaging of the chest was performed during intravenous contrast  administration. RADIATION DOSE REDUCTION: This exam was performed according to the departmental dose-optimization program which includes  automated exposure control, adjustment of the mA and/or kV according to patient size and/or use of iterative reconstruction technique. CONTRAST:  75mL OMNIPAQUE IOHEXOL 300 MG/ML  SOLN COMPARISON:  Chest radiograph 06/08/2023.  CT chest 09/09/2022 FINDINGS: Cardiovascular: Cardiac enlargement. No pericardial effusions. Normal caliber thoracic aorta. Calcification of the aorta and coronary arteries. Mediastinum/Nodes: Esophagus is decompressed. Mediastinal lymph nodes are not pathologically enlarged. Lungs/Pleura: Small bilateral pleural effusions are decreased in size since prior CT. Interstitial changes in the lung bases likely representing edema. Linear opacities in the left base are likely linear atelectasis. No pneumothorax. Upper Abdomen: Small amount of upper abdominal ascites. Several stones demonstrated in both kidneys. Largest visualized on the right is 1.4 cm in diameter. No visualized hydronephrosis although kidneys are not entirely included within the field of view. Hepatic cirrhosis with enlarged lateral segment left lobe and caudate lobe of the liver. Nodular contour to the liver. Spleen is enlarged. Gallbladder is surgically absent. Musculoskeletal: Degenerative changes in the spine. No acute bony abnormalities. IMPRESSION: 1. Small bilateral pleural effusions, decreased since prior CT. 2. Linear atelectasis in the left lung base. 3. Interstitial pattern to the lung bases likely represents mild edema. 4. Cardiac enlargement. 5. Aortic atherosclerosis. 6. Hepatic cirrhosis with splenic enlargement and upper abdominal ascites. 7. Nonobstructing stones in the kidneys. Electronically Signed   By: Burman Nieves M.D.   On: 06/08/2023 22:28   DG Chest Port 1 View Result Date: 06/08/2023 CLINICAL DATA:  Cough, bleeding from PICC, possible COVID. Bilateral pitting edema  in legs. EXAM: PORTABLE CHEST 1 VIEW COMPARISON:  10/27/2022. FINDINGS: Heart is enlarged and the mediastinal contour is within normal limits. There is atherosclerotic calcification of the aorta. There is mild airspace disease at the left lung base with a chronic left pleural effusion. The right lung is clear. A stable opacity is noted in the left upper lobe, which may represent overlapping structures and prominence of the first rib on the left. No pneumothorax. A left-sided PICC line terminates over the superior vena cava. IMPRESSION: Small left pleural effusion with atelectasis or infiltrate at the left lung base. Electronically Signed   By: Thornell Sartorius M.D.   On: 06/08/2023 20:09    EKG: Independently reviewed.  Sinus bradycardia with first-degree AV block.  Assessment/Plan Principal Problem:   Anemia Active Problems:   Hypothyroidism   Essential hypertension   Gout   Thrombocytopenia (HCC)   COVID-19 virus infection   MSSA bacteremia    Bleeding from PICC line site and has been arrested after placing pressure.  No further bleeding noticed.  There was no bleeding from any other site.  Will continue to monitor. Pancytopenia has been followed by oncologist.  And has received 2 units of PRBC now.  I discussed with Dr. Truett Perna on-call oncologist who advised that Dr. Arbutus Ped oncologist will see patient in consult to address if patient needs further platelet transfusion.  Patient has had extensive workup for pancytopenia.  On prednisone which patient's wife saying that is to be continued.  The differentials were thought to be either drug-induced or autoimmune abnormality for which patient is following rheumatologist.  During last admission patient did receive IVIG times two.  Also had received platelet transfusions. MSSA bacteremia and UTI on IV cefazolin until 07/02/2023.  Discussed with pharmacy about the dosing. COVID-19 infection presently not hypoxic and has no symptoms concerning for COVID.   Continue to monitor. Bradycardia monitor shows sinus bradycardia with first-degree AV block.  Patient is not on any rate limiting  medications.  Will closely monitor.  Check TSH.  Patient has had prior history of ablation for AVNRT. Hypothyroidism on Synthroid. Chronic back pain on oxycodone and methocarbamol and gabapentin. History of gout on prednisone now. Cirrhosis of liver with splenomegaly seen on the CAT scan.  Patient has significant third spacing of fluids.  I did order 2 dose of Lasix so far.  Will continue to monitor. Hypertension on lisinopril and Cardura. History of macular degeneration.  Since patient has bleeding from the PICC line with chronic pancytopenia will need close monitoring and further workup and more than 2 midnight stay.   DVT prophylaxis: SCDs. Code Status: DNR confirmed with patient's wife. Family Communication: Patient's wife. Disposition Plan: Monitored bed. Consults called: Oncologist. Admission status: Observation.

## 2023-06-09 NOTE — Progress Notes (Addendum)
 Samuel Little   DOB:31-Aug-1932   HY#:[id number]      ASSESSMENT & PLAN:  Bleeding from PICC  -patient admitted 06/09/23 with complaints of bleeding from insertion site of PICC line.  Apparently RUE PICC line had dislodged two days prior and a new one was place on LUE.  Bleeding subsequently noted. -No other bleeding noted.  No bleeding noted at this time. -Bleeding is likely due to low platelets -patient follows with Hematology/Dr. 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  Informed Dr. Arbutus Ped, pending further recommendation.  Pancytopenia: Thrombocytopenia -platelets very low 17k.  Recommend platelet transfusion today-ordered 1 unit to be transfused today. - History of pancytopenia likely secondary to medication or autoimmune disorder due to elevated rheumatoid factor.   - Patient received IVIG during earlier inpatient hospitalization March 2025.  - Patient has had previous extensive studies that were unremarkable. - Bone marrow biopsy and aspirate performed performed August 2024 showed no underlying MDS or MPN.  The findings were suspected to be secondary to drugs/toxins or autoimmune disorder. -Transfuse platelets for counts <20 K or <50 K with active bleeding. - Patient has had extensive studies in the past - Monitor CBC with differential  Anemia - hemogloving low 8.5, status post PRBC transfusions with hgb noted at 6.2 one day ago. - May be multifactorial due to infection and chronic disease - Transfuse PRBC for Hgb <7.0. - No transfusional intervention at this time.  - Monitor CBC with differential  Leukopenia - low WBC 3.5, no intervention required at this time - continue to monitor CBC with diff   COVID 19+ - patient tested positive for covid - continue precautions per protocol  Bacteremia - continue antibiotics as ordered  Hard of hearing - Continue supportive care   Hypertension Hypothyroidism - Continue to  monitor blood pressure levels and administer anti-hypertensives.  - On Synthroid 150 mcg daily    Code Status DNR-Limited  Subjective:  Patient is stable.  Complains of moderate back pain with difficulty walking.  Currently on COVID isolation precautions.  Majority of the discussion is with patient's wife regarding reason patient is admitted and current medical status.  She is very appreciative of all the care that he is receiving here. No other acute complaints.  Objective:  Vitals:   06/09/23 0521 06/09/23 0833  BP: (!) 141/60 134/60  Pulse: (!) 54 (!) 58  Resp: 15 20  Temp: 97.8 F (36.6 C) 97.7 F (36.5 C)  SpO2: 99% 97%     Intake/Output Summary (Last 24 hours) at 06/09/2023 1101 Last data filed at 06/09/2023 0900 Gross per 24 hour  Intake 991.35 ml  Output 1200 ml  Net -208.65 ml     REVIEW OF SYSTEMS:   Constitutional: +Back pain, denies fevers, chills or abnormal night sweats Eyes: Denies blurriness of vision, double vision or watery eyes Ears, nose, mouth, throat, and face: Denies mucositis or sore throat Respiratory: Denies cough, dyspnea or wheezes Cardiovascular: Denies palpitation, chest discomfort or lower extremity swelling Gastrointestinal:  Denies nausea, heartburn or change in bowel habits Skin: Denies abnormal skin rashes Lymphatics: Denies new lymphadenopathy or easy bruising Neurological: Denies numbness, tingling or new weaknesses Behavioral/Psych: Mood is stable, no new changes  All other systems were reviewed with the patient and are negative.  PHYSICAL EXAMINATION: ECOG PERFORMANCE STATUS: 3 - Symptomatic, >50% confined to bed  Vitals:   06/09/23 0521 06/09/23 0833  BP: (!) 141/60 134/60  Pulse: (!) 54 (!)  58  Resp: 15 20  Temp: 97.8 F (36.6 C) 97.7 F (36.5 C)  SpO2: 99% 97%   Filed Weights   06/08/23 1829 06/09/23 0127  Weight: 185 lb (83.9 kg) 229 lb 3.2 oz (104 kg)    GENERAL: alert, no distress and comfortable SKIN: + Pale  skin color, texture, turgor are normal, no rashes or significant lesions EYES: normal, conjunctiva are pink and non-injected, sclera clear OROPHARYNX: no exudate, no erythema and lips, buccal mucosa, and tongue normal  NECK: supple, thyroid normal size, non-tender, without nodularity LYMPH: no palpable lymphadenopathy in the cervical, axillary or inguinal LUNGS: clear to auscultation and percussion with normal breathing effort HEART: regular rate & rhythm and no murmurs and no lower extremity edema ABDOMEN: abdomen soft, non-tender and normal bowel sounds MUSCULOSKELETAL: no cyanosis of digits and no clubbing  PSYCH: alert & oriented x 3 with fluent speech NEURO: no focal motor/sensory deficits   All questions were answered. The patient knows to call the clinic with any problems, questions or concerns.   The total time spent in the appointment was 40 minutes encounter with patient including review of chart and various tests results, discussions about plan of care and coordination of care plan  Dawson Bills, NP 06/09/2023 11:01 AM    Labs Reviewed:  Lab Results  Component Value Date   WBC 3.5 (L) 06/09/2023   HGB 8.5 (L) 06/09/2023   HCT 25.8 (L) 06/09/2023   MCV 102.8 (H) 06/09/2023   PLT 17 (LL) 06/09/2023   Recent Labs    06/07/23 1119 06/08/23 2019 06/09/23 0033  NA 134* 133* 133*  K 4.1 4.4 4.5  CL 105 106 105  CO2 24 21* 23  GLUCOSE 103* 141* 139*  BUN 43* 58* 60*  CREATININE 0.92 1.05 1.09  CALCIUM 8.4* 8.3* 8.2*  GFRNONAA >60 >60 >60  PROT 6.3* 6.0* 5.7*  ALBUMIN 2.2* 1.9* 1.8*  AST 39 45* 43*  ALT 6 10 10   ALKPHOS 229* 189* 196*  BILITOT 1.3* 1.0 1.2    Studies Reviewed:  VAS [place] LOWER EXTREMITY VENOUS (DVT) Result Date: 06/09/2023  Lower Venous DVT Study Patient Name:  HUNTLEY KNOOP  Date of Exam:   06/09/2023 Medical Rec #: [id number]       Accession #:    [id number] Date of Birth: [birth year]       Patient Gender: M Patient Age:   89 years Exam Location:   [facility] Procedure:      VAS [place] LOWER EXTREMITY VENOUS (DVT) Referring Phys: Midge Minium --------------------------------------------------------------------------------  Indications: Edema.  Risk Factors: None identified. Limitations: Poor ultrasound/tissue interface and patient positioning. Comparison Study: No prior studies. Performing Technologist: Chanda Busing RVT  Examination Guidelines: A complete evaluation includes B-mode imaging, spectral Doppler, color Doppler, and power Doppler as needed of all accessible portions of each vessel. Bilateral testing is considered an integral part of a complete examination. Limited examinations for reoccurring indications may be performed as noted. The reflux portion of the exam is performed with the patient in reverse Trendelenburg.  +---------+---------------+---------+-----------+----------+-------------------+ RIGHT    CompressibilityPhasicitySpontaneityPropertiesThrombus Aging      +---------+---------------+---------+-----------+----------+-------------------+ CFV      Full           Yes      Yes                                      +---------+---------------+---------+-----------+----------+-------------------+  SFJ      Full                                                             +---------+---------------+---------+-----------+----------+-------------------+ FV Prox  Full                                                             +---------+---------------+---------+-----------+----------+-------------------+ FV Mid                  Yes      Yes                                      +---------+---------------+---------+-----------+----------+-------------------+ FV Distal               Yes      Yes                                      +---------+---------------+---------+-----------+----------+-------------------+ PFV      Full                                                              +---------+---------------+---------+-----------+----------+-------------------+ POP      Full           Yes      Yes                                      +---------+---------------+---------+-----------+----------+-------------------+ PTV      Full                                                             +---------+---------------+---------+-----------+----------+-------------------+ PERO                                                  Not well visualized +---------+---------------+---------+-----------+----------+-------------------+   +---------+---------------+---------+-----------+----------+--------------+ LEFT     CompressibilityPhasicitySpontaneityPropertiesThrombus Aging +---------+---------------+---------+-----------+----------+--------------+ CFV      Full           Yes      Yes                                 +---------+---------------+---------+-----------+----------+--------------+ SFJ      Full                                                        +---------+---------------+---------+-----------+----------+--------------+  FV Prox  Full                                                        +---------+---------------+---------+-----------+----------+--------------+ FV Mid                  Yes      Yes                                 +---------+---------------+---------+-----------+----------+--------------+ FV Distal               Yes      Yes                                 +---------+---------------+---------+-----------+----------+--------------+ PFV      Full                                                        +---------+---------------+---------+-----------+----------+--------------+ POP      Full           Yes      Yes                                 +---------+---------------+---------+-----------+----------+--------------+ PTV      Full                                                         +---------+---------------+---------+-----------+----------+--------------+ PERO     Full                                                        +---------+---------------+---------+-----------+----------+--------------+    Summary: RIGHT: - There is no evidence of deep vein thrombosis in the lower extremity. However, portions of this examination were limited- see technologist comments above.  - No cystic structure found in the popliteal fossa.  LEFT: - There is no evidence of deep vein thrombosis in the lower extremity. However, portions of this examination were limited- see technologist comments above.  - No cystic structure found in the popliteal fossa.  *See table(s) above for measurements and observations.    Preliminary    CT Chest W Contrast Result Date: 06/08/2023 CLINICAL DATA:  Pneumonia with complication suspected. X-ray done. Respiratory illness with nondiagnostic x-ray. Possible COVID. Bilateral pitting edema. EXAM: CT CHEST WITH CONTRAST TECHNIQUE: Multidetector CT imaging of the chest was performed during intravenous contrast administration. RADIATION DOSE REDUCTION: This exam was performed according to the departmental dose-optimization program which includes automated exposure control, adjustment of the mA and/or kV according to patient size and/or use of iterative reconstruction technique. CONTRAST:  75mL OMNIPAQUE IOHEXOL 300 MG/ML  SOLN COMPARISON:  Chest radiograph 06/08/2023.  CT chest 09/09/2022 FINDINGS: Cardiovascular: Cardiac enlargement. No pericardial effusions. Normal caliber thoracic aorta. Calcification of the aorta and coronary arteries. Mediastinum/Nodes: Esophagus is decompressed. Mediastinal lymph nodes are not pathologically enlarged. Lungs/Pleura: Small bilateral pleural effusions are decreased in size since prior CT. Interstitial changes in the lung bases likely representing edema. Linear opacities in the left base are likely linear atelectasis. No pneumothorax. Upper  Abdomen: Small amount of upper abdominal ascites. Several stones demonstrated in both kidneys. Largest visualized on the right is 1.4 cm in diameter. No visualized hydronephrosis although kidneys are not entirely included within the field of view. Hepatic cirrhosis with enlarged lateral segment left lobe and caudate lobe of the liver. Nodular contour to the liver. Spleen is enlarged. Gallbladder is surgically absent. Musculoskeletal: Degenerative changes in the spine. No acute bony abnormalities. IMPRESSION: 1. Small bilateral pleural effusions, decreased since prior CT. 2. Linear atelectasis in the left lung base. 3. Interstitial pattern to the lung bases likely represents mild edema. 4. Cardiac enlargement. 5. Aortic atherosclerosis. 6. Hepatic cirrhosis with splenic enlargement and upper abdominal ascites. 7. Nonobstructing stones in the kidneys. Electronically Signed   By: Burman Nieves M.D.   On: 06/08/2023 22:28   DG Chest Port 1 View Result Date: 06/08/2023 CLINICAL DATA:  Cough, bleeding from PICC, possible COVID. Bilateral pitting edema in legs. EXAM: PORTABLE CHEST 1 VIEW COMPARISON:  10/27/2022. FINDINGS: Heart is enlarged and the mediastinal contour is within normal limits. There is atherosclerotic calcification of the aorta. There is mild airspace disease at the left lung base with a chronic left pleural effusion. The right lung is clear. A stable opacity is noted in the left upper lobe, which may represent overlapping structures and prominence of the first rib on the left. No pneumothorax. A left-sided PICC line terminates over the superior vena cava. IMPRESSION: Small left pleural effusion with atelectasis or infiltrate at the left lung base. Electronically Signed   By: Thornell Sartorius M.D.   On: 06/08/2023 20:09   [place] EKG SITE RITE Result Date: 05/29/2023 If Site Rite image not attached, placement could not be confirmed due to current cardiac rhythm.  CT MAXILLOFACIAL W CONTRAST Result Date:  05/26/2023 CLINICAL DATA:  Abscess EXAM: CT MAXILLOFACIAL WITH CONTRAST TECHNIQUE: Multidetector CT imaging of the maxillofacial structures was performed with intravenous contrast. Multiplanar CT image reconstructions were also generated. RADIATION DOSE REDUCTION: This exam was performed according to the departmental dose-optimization program which includes automated exposure control, adjustment of the mA and/or kV according to patient size and/or use of iterative reconstruction technique. CONTRAST:  75mL OMNIPAQUE IOHEXOL 350 MG/ML SOLN COMPARISON:  None Available. FINDINGS: Osseous: No fracture or mandibular dislocation. No destructive process. Orbits: Negative. No traumatic or inflammatory finding. Sinuses: Clear. Soft tissues: Negative. Limited intracranial: No significant or unexpected finding. IMPRESSION: No acute abnormality of the face. Electronically Signed   By: Deatra Robinson M.D.   On: 05/26/2023 00:31   ECHOCARDIOGRAM COMPLETE Result Date: 05/24/2023    ECHOCARDIOGRAM REPORT   Patient Name:   LAMARION MCEVERS Date of Exam: 05/24/2023 Medical Rec #:  [id number]      Height:       73.0 in Accession #:    [id number]     Weight:       190.7 lb Date of Birth:  [birth year]      BSA:          2.109 m Patient Age:    31 years  BP:           120/57 mmHg Patient Gender: M              HR:           74 bpm. Exam Location:  Inpatient Procedure: 2D Echo, Cardiac Doppler, Color Doppler and Intracardiac            Opacification Agent (Both Spectral and Color Flow Doppler were            utilized during procedure). Indications:    Endocarditis  History:        Patient has prior history of Echocardiogram examinations, most                 recent 09/10/2022. Risk Factors:Hypertension.  Sonographer:    Amy Chionchio Referring Phys: [id number] [facility] IMPRESSIONS  1. Left ventricular ejection fraction, by estimation, is 60 to 65%. The left ventricle has normal function. The left ventricle has no regional wall motion  abnormalities. There is mild concentric left ventricular hypertrophy. Left ventricular diastolic parameters were normal.  2. Right ventricular systolic function is normal. The right ventricular size is normal.  3. The mitral valve is degenerative. Trivial mitral valve regurgitation. No evidence of mitral stenosis.  4. Moderate tricuspid stenosis.  5. The aortic valve is tricuspid. There is mild calcification of the aortic valve. Aortic valve regurgitation is trivial. Aortic valve sclerosis/calcification is present, without any evidence of aortic stenosis.  6. Aortic dilatation noted. There is mild dilatation of the ascending aorta, measuring 43 mm. There is borderline dilatation of the aortic root, measuring 39 mm.  7. The inferior vena cava is normal in size with <50% respiratory variability, suggesting right atrial pressure of 8 mmHg. FINDINGS  Left Ventricle: Left ventricular ejection fraction, by estimation, is 60 to 65%. The left ventricle has normal function. The left ventricle has no regional wall motion abnormalities. Definity contrast agent was given IV to delineate the left ventricular  endocardial borders. The left ventricular internal cavity size was normal in size. There is mild concentric left ventricular hypertrophy. Left ventricular diastolic parameters were normal. Right Ventricle: The right ventricular size is normal. No increase in right ventricular wall thickness. Right ventricular systolic function is normal. Left Atrium: Left atrial size was normal in size. Right Atrium: Right atrial size was normal in size. Pericardium: There is no evidence of pericardial effusion. Mitral Valve: The mitral valve is degenerative in appearance. There is mild calcification of the mitral valve leaflet(s). Trivial mitral valve regurgitation. No evidence of mitral valve stenosis. MV peak gradient, 2.4 mmHg. The mean mitral valve gradient  is 1.0 mmHg. Tricuspid Valve: The tricuspid valve is normal in structure.  Tricuspid valve regurgitation is trivial. Moderate tricuspid stenosis. Aortic Valve: The aortic valve is tricuspid. There is mild calcification of the aortic valve. Aortic valve regurgitation is trivial. Aortic valve sclerosis/calcification is present, without any evidence of aortic stenosis. Aortic valve mean gradient measures 6.0 mmHg. Aortic valve peak gradient measures 12.0 mmHg. Aortic valve area, by VTI measures 2.33 cm. Pulmonic Valve: The pulmonic valve was normal in structure. Pulmonic valve regurgitation is trivial. No evidence of pulmonic stenosis. Aorta: Aortic dilatation noted. There is mild dilatation of the ascending aorta, measuring 43 mm. There is borderline dilatation of the aortic root, measuring 39 mm. Venous: The inferior vena cava is normal in size with less than 50% respiratory variability, suggesting right atrial pressure of 8 mmHg. IAS/Shunts: No atrial level shunt detected by color  flow Doppler.  LEFT VENTRICLE PLAX 2D LVIDd:         5.20 cm      Diastology LVIDs:         3.70 cm      LV e' medial:    9.25 cm/s LV PW:         1.00 cm      LV E/e' medial:  8.7 LV IVS:        1.10 cm      LV e' lateral:   11.00 cm/s LVOT diam:     2.10 cm      LV E/e' lateral: 7.3 LV SV:         77 LV SV Index:   36 LVOT Area:     3.46 cm  LV Volumes (MOD) LV vol d, MOD A2C: 164.0 ml LV vol d, MOD A4C: 143.0 ml LV vol s, MOD A2C: 73.2 ml LV vol s, MOD A4C: 61.6 ml LV SV MOD A2C:     90.8 ml LV SV MOD A4C:     143.0 ml LV SV MOD BP:      97.2 ml RIGHT VENTRICLE             IVC RV Basal diam:  3.30 cm     IVC diam: 2.00 cm RV S prime:     11.40 cm/s TAPSE (M-mode): 1.7 cm LEFT ATRIUM              Index        RIGHT ATRIUM           Index LA Vol (A2C):   109.0 ml 51.70 ml/m  RA Area:     12.20 cm LA Vol (A4C):   93.8 ml  44.49 ml/m  RA Volume:   23.30 ml  11.05 ml/m LA Biplane Vol: 103.0 ml 48.85 ml/m  AORTIC VALVE                     PULMONIC VALVE AV Area (Vmax):    2.18 cm      PV Vmax:       0.86 m/s  AV Area (Vmean):   2.10 cm      PV Peak grad:  3.0 mmHg AV Area (VTI):     2.33 cm AV Vmax:           173.00 cm/s AV Vmean:          119.000 cm/s AV VTI:            0.330 m AV Peak Grad:      12.0 mmHg AV Mean Grad:      6.0 mmHg LVOT Vmax:         109.00 cm/s LVOT Vmean:        72.200 cm/s LVOT VTI:          0.222 m LVOT/AV VTI ratio: 0.67  AORTA Ao Root diam: 3.90 cm Ao Asc diam:  4.25 cm MITRAL VALVE MV Area (PHT): 3.40 cm    SHUNTS MV Area VTI:   3.46 cm    Systemic VTI:  0.22 m MV Peak grad:  2.4 mmHg    Systemic Diam: 2.10 cm MV Mean grad:  1.0 mmHg MV Vmax:       0.78 m/s MV Vmean:      45.5 cm/s MV Decel Time: 223 msec MV E velocity: 80.70 cm/s MV A velocity: 48.20 cm/s MV E/A ratio:  1.67 Arvilla Meres MD Electronically  signed by Arvilla Meres MD Signature Date/Time: 05/24/2023/12:45:28 PM    Final    MR THORACIC SPINE WO CONTRAST Result Date: 05/24/2023 CLINICAL DATA:  Infection EXAM: MRI THORACIC AND LUMBAR SPINE WITHOUT CONTRAST TECHNIQUE: Multiplanar and multiecho pulse sequences of the thoracic and lumbar spine were obtained without intravenous contrast. COMPARISON:  01/18/2022 lumbar spine MRI FINDINGS: MRI THORACIC SPINE FINDINGS Alignment:  Physiologic. Vertebrae: No fracture, evidence of discitis, or bone lesion. Cord:  Normal signal and morphology. Paraspinal and other soft tissues: Negative. Disc levels: T2-3: Small disc bulge without spinal canal stenosis. No other spinal canal or neural foraminal stenosis. MRI LUMBAR SPINE FINDINGS Segmentation:  Standard. Alignment:  Grade 1 retrolisthesis at L1-2, L2-3 and L3-4 Vertebrae:  No fracture, evidence of discitis, or bone lesion. Conus medullaris and cauda equina: Conus extends to the L1 level. Conus and cauda equina appear normal. Paraspinal and other soft tissues: Negative. Disc levels: L1-L2: Small disc bulge with endplate spurring. No spinal canal stenosis. Severe right and mild left neural foraminal stenosis. L2-L3: Small disc  bulge. No spinal canal stenosis. No neural foraminal stenosis. L3-L4: Intermediate sized disc bulge with endplate spurring, unchanged. Moderate spinal canal stenosis. Mild right and severe left neural foraminal stenosis. L4-L5: Unchanged mild disc bulge with endplate spurring. Narrowing of both lateral recesses without central spinal canal stenosis. Moderate bilateral neural foraminal stenosis. L5-S1: Intermediate sized disc bulge with mild facet hypertrophy. Unchanged moderate spinal canal stenosis. Severe bilateral neural foraminal stenosis. Visualized sacrum: Normal. IMPRESSION: 1. No acute abnormality of the thoracic or lumbar spine. 2. Unchanged moderate spinal canal stenosis at L3-4 and L5-S1. 3. Unchanged severe right L1-2, left L3-4 and bilateral L5-S1 neural foraminal stenosis. 4. 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 Electronically Signed   By: Deatra Robinson M.D.   On: 05/24/2023 01:43   MR LUMBAR SPINE WO CONTRAST Result Date: 05/24/2023 CLINICAL DATA:  Infection EXAM: MRI THORACIC AND LUMBAR SPINE WITHOUT CONTRAST TECHNIQUE: Multiplanar and multiecho pulse sequences of the thoracic and lumbar spine were obtained without intravenous contrast. COMPARISON:  01/18/2022 lumbar spine MRI FINDINGS: MRI THORACIC SPINE FINDINGS Alignment:  Physiologic. Vertebrae: No fracture, evidence of discitis, or bone lesion. Cord:  Normal signal and morphology. Paraspinal and other soft tissues: Negative. Disc levels: T2-3: Small disc bulge without spinal canal stenosis. No other spinal canal or neural foraminal stenosis. MRI LUMBAR SPINE FINDINGS Segmentation:  Standard. Alignment:  Grade 1 retrolisthesis at L1-2, L2-3 and L3-4 Vertebrae:  No fracture, evidence of discitis, or bone lesion. Conus medullaris and cauda equina: Conus extends to the L1 level. Conus and cauda equina appear normal. Paraspinal and other soft tissues: Negative. Disc levels: L1-L2: Small disc bulge with endplate spurring. No  spinal canal stenosis. Severe right and mild left neural foraminal stenosis. L2-L3: Small disc bulge. No spinal canal stenosis. No neural foraminal stenosis. L3-L4: Intermediate sized disc bulge with endplate spurring, unchanged. Moderate spinal canal stenosis. Mild right and severe left neural foraminal stenosis. L4-L5: Unchanged mild disc bulge with endplate spurring. Narrowing of both lateral recesses without central spinal canal stenosis. Moderate bilateral neural foraminal stenosis. L5-S1: Intermediate sized disc bulge with mild facet hypertrophy. Unchanged moderate spinal canal stenosis. Severe bilateral neural foraminal stenosis. Visualized sacrum: Normal. IMPRESSION: 1. No acute abnormality of the thoracic or lumbar spine. 2. Unchanged moderate spinal canal stenosis at L3-4 and L5-S1. 3. Unchanged severe right L1-2, left L3-4 and bilateral L5-S1 neural foraminal stenosis. 4. 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 Electronically Signed  By: Deatra Robinson M.D.   On: 05/24/2023 01:43   US RENAL Result Date: 05/23/2023 CLINICAL DATA:  Acute kidney injury. EXAM: RENAL / URINARY TRACT ULTRASOUND COMPLETE COMPARISON:  None Available. FINDINGS: Right Kidney: Renal measurements: 8.2 cm x 5.3 cm x 4.5 cm = volume: 109.4 mL. Echogenicity within normal limits. A 17.7 mm shadowing echogenic renal calculus is seen within the right kidney. No mass or hydronephrosis visualized. Left Kidney: Renal measurements: 11.8 cm x 4.1 cm x 4.7 cm = volume: 119 mL. Echogenicity within normal limits. An 8.9 mm shadowing echogenic renal calculus is seen within the left kidney. No mass or hydronephrosis visualized. Bladder: Echogenic material is seen within the bladder lumen. Other: It should be noted that the study is technically limited secondary to overlying bowel gas, as per the ultrasound technologist. Of incidental note is the presence of an enlarged spleen (17.4 cm in length). IMPRESSION: 1. Bilateral  nonobstructing renal calculi. 2. Echogenic material within the urinary bladder which may represent sequelae associated with recent infection. Correlation with urinalysis is recommended. 3. Splenomegaly. Electronically Signed   By: Aram Candela M.D.   On: 05/23/2023 01:11   CT Renal Stone Study Result Date: 05/22/2023 CLINICAL DATA:  Abdominal/flank pain, stone suspected EXAM: CT ABDOMEN AND PELVIS WITHOUT CONTRAST TECHNIQUE: Multidetector CT imaging of the abdomen and pelvis was performed following the standard protocol without IV contrast. RADIATION DOSE REDUCTION: This exam was performed according to the departmental dose-optimization program which includes automated exposure control, adjustment of the mA and/or kV according to patient size and/or use of iterative reconstruction technique. COMPARISON:  11/20/2017 FINDINGS: Lower chest: Small left pleural effusion. Linear atelectasis in the left lower lobe. Hepatobiliary: Unremarkable unenhanced appearance of the liver. Clips in the gallbladder fossa postcholecystectomy. No biliary dilatation. Pancreas: No ductal dilatation or inflammation. Spleen: The spleen is enlarged, 15.4 cm cranial caudal. Adrenals/Urinary Tract: No adrenal nodule. There are bilateral nonobstructing intrarenal calculi. No hydronephrosis. Both ureters are decompressed without ureteral stone. The urinary bladder is partially distended, no bladder stone or wall thickening. Stomach/Bowel: Small hiatal hernia. The stomach is nondistended. No small bowel obstruction or inflammatory change. Moderate diffuse colonic diverticulosis. Equivocal faint stranding about a diverticulum involving the distal descending colon series 6, image 81, may represent mild diverticulitis. Moderate volume of stool in the colon. Sigmoid colon is redundant. Vascular/Lymphatic: Aortic atherosclerosis. The aorta is tortuous but nonaneurysmal. No bulky abdominopelvic adenopathy. Reproductive: Enlarged prostate spans  5.9 cm transverse. Other: No ascites. No free air or focal fluid collection small bilateral inguinal hernias contain fat and a small amount of free fluid. Mild body wall edema. Musculoskeletal: The bones are subjectively under mineralized. Diffuse degenerative change throughout the lumbar spine with multilevel degenerative disc disease and facet hypertrophy. No fracture or acute osseous findings. IMPRESSION: 1. Bilateral nonobstructing intrarenal calculi. No ureteral stone or hydronephrosis. 2. Moderate diffuse colonic diverticulosis. Equivocal faint stranding about a diverticulum involving the distal descending colon, may represent mild diverticulitis. 3. Diffuse degenerative change throughout the spine without acute osseous findings. 4. Small left pleural effusion. 5. Chronic incidental findings include small hiatal hernia. Splenomegaly. Enlarged prostate. 6. Small bilateral inguinal hernias contain fat and a small amount of free fluid. Aortic Atherosclerosis (ICD10-I70.0). Electronically Signed   By: Narda Rutherford M.D.   On: 05/22/2023 18:29

## 2023-06-09 NOTE — Assessment & Plan Note (Addendum)
 06-09-2023 TSH 7.8. will check FT4.  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  Prognosis is terminal.

## 2023-06-09 NOTE — Assessment & Plan Note (Addendum)
 06-09-2023 hold ACEI while diuresing. Continue with IV lasix 20 mg bid.  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  Prognosis is terminal. Stop IV lasix

## 2023-06-09 NOTE — Assessment & Plan Note (Addendum)
 06-09-2023 continue with IV Ancef as already scheduled when he was discharged from the hospital on 05-29-2023.  Cefazolin 2g IV q8h through 07/06/23   06-10-2023 stop IV abx. patient has AML.  Prognosis is terminal.

## 2023-06-09 NOTE — Assessment & Plan Note (Addendum)
 06-09-2023 oncology consulted. Pt has been dealing with his low platelet count for about 8 months now. Etiology unknown.  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  Prognosis is terminal.   06-11-2023 patient has AML.  Prognosis is terminal. Awaiting hospice to see patient and meet with family.

## 2023-06-09 NOTE — Assessment & Plan Note (Addendum)
 06-09-2023 from bleeding PICC site. S/p 2 units PRBC transfusion. Initial HgB of 6.2 g/dl. Post-transfusion HgB of 8.5 g/dl.  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  Prognosis is terminal. Family wants pt to continue to receive PRBC and platelet transfusion during the rest of his hospital stay. This was discussed between family and oncology.  06-11-2023 patient has AML.  Prognosis is terminal. Awaiting hospice to see patient and meet with family. I do not see the need to keep checking labs or giving PRBC/platelet transfusions. This would be futile care. Transfusion of PRBC or platelet will not change his outcome.

## 2023-06-09 NOTE — Assessment & Plan Note (Signed)
 06-09-2023 chronic. Stable.

## 2023-06-09 NOTE — Subjective & Objective (Addendum)
 Pt seen and examined. No family at bedside. Awaiting hospice to see patient and meet with family.

## 2023-06-09 NOTE — Assessment & Plan Note (Signed)
 06-09-2023 stopped with pressure dressing

## 2023-06-09 NOTE — Progress Notes (Signed)
 Bilateral lower extremity venous duplex has been completed. Preliminary results can be found in CV Proc through chart review.   06/09/23 9:39 AM Olen Cordial RVT

## 2023-06-09 NOTE — Progress Notes (Signed)
 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  I tried to reach the patient in his room but he does not have his hearing aid and I was not able to speak to him directly. I called his wife and discussed the recent findings with her.  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  The patient is 75 and has poor performance status with multiple comorbidities.  I do not think the patient will be a candidate for any systemic treatment for the acute myeloid leukemia.  I strongly recommended for the wife to have a discussion with the patient and the family about comfort care only at this point.  She is in agreement with the current assessment and she will discuss with the children and also with her husband. Interestingly the bone marrow biopsy and aspirate that was performed in August 2024 for pancytopenia revealed a slightly hypercellular bone marrow, however without overt dysplasia in any of the lineages.  Secondary causes of  pancytopenia such as drug/toxin, autoimmune diseases etc. may be  considered.  I asked with my partner Dr. Candise Che who is covering the service this weekend to see the patient tomorrow and to answer any questions that the family may have. The wife indicated interest in transferring the patient to [facility] for end-of-life care. Thank you for taking good care of Samuel Little.  Please reach out to the oncology service if you have any questions.

## 2023-06-09 NOTE — Progress Notes (Signed)
 PROGRESS NOTE    Samuel Little  XBJ:[id number] DOB: October 11, 1932 DOA: 06/08/2023 PCP: Georgann Housekeeper, MD  Subjective: Pt seen and examined. Met with pt's wife at bedside. Pt extremely hard of hearing. C/o of back pain. Had ted hose on his legs. Has +2 pitting LE, ankle and pedal edema. bilaterally   Hospital Course: HPI:  Samuel Little is a 89 y.o. 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  Patient had dislodged [name] of the right upper extremity  2 days ago and a new one was placed on the left upper extremity.  Earlier yesterday patient was noticed to increasing bleeding from the PICC line insertion site and was referred to the ER.  No bleeding from other sites.  No bleeding from the mouth or urine.  2 days ago patient had followed up with Dr. Arbutus Ped oncologist and had received platelet transfusion for low platelets.  Patient otherwise denies any chest pain shortness of breath nausea vomiting diarrhea and is able to ambulate with help of physical therapy.   ED Course: On exam patient has significant peripheral edema.  In the ER after placing pressure the bleeding was able to be stopped.  Labs show hemoglobin of 6.2 which is decreased from 7 two days ago and platelets of 18.  2 units of PRBC transfusion was ordered.  CT chest shows mild bilateral pleural effusion and also signs of cirrhosis of the liver with splenomegaly.  INR is 1.3.  Significant Events: Admitted 06/08/2023 acute blood loss anemia   Significant Labs: Covid POSITIVE Na 133, K 4.4, CO2 of 21, BUN 58, Scr 1.05, glu 141 WBC 2.4, Hgb 6.2,  plt 18  Significant Imaging Studies: CXR Small left pleural effusion with atelectasis or infiltrate at the left lung base.  left-sided PICC line terminates over the superior vena cava.  CT chest Small bilateral pleural effusions, decreased since prior CT. 2. Linear atelectasis in the left lung base. 3. Interstitial pattern to the lung bases likely represents mild edema. 4. Cardiac enlargement. 5. Aortic atherosclerosis.  6. Hepatic cirrhosis with splenic enlargement and upper abdominal ascites. 7. Nonobstructing stones in the kidneys  Antibiotic Therapy: Anti-infectives (From admission, onward)    Start     Dose/Rate Route Frequency Ordered Stop   06/09/23 0800  ceFAZolin (ANCEF) IVPB 2g/100 mL premix        2 g 200 mL/hr over 30 Minutes Intravenous Every 8 hours 06/09/23 0027     06/09/23 0115  ceFAZolin (ANCEF) IVPB 2g/100 mL premix        2 g 200 mL/hr over 30 Minutes Intravenous NOW 06/09/23 0027 06/09/23 0146   06/09/23 0015  ceFAZolin (ANCEF) IVPB  Status:  Discontinued       Note to Pharmacy: Indication:  MSSA Bacteremia First Dose: Yes Last Day of Therapy:  07/06/23 Labs - Once weekly:  CBC/D and BMP, Labs - Once weekly: ESR and CRP     2 g Intravenous Every 8 hours 06/09/23 0011 06/09/23 0027       Procedures:   Consultants: oncology    Assessment and Plan: * Bleeding from PICC line, initial  encounter (HCC) 06-09-2023 stopped with pressure dressing  Bilateral leg edema 06-09-2023 continue with IV lasix to help with edema. Pt's low serum albumin is likely contributing to his edema.  Thrombocytopenia (HCC) 06-09-2023 oncology consulted. Pt has been dealing with his low platelet count for about 8 months now. Etiology unknown.  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  Acute blood loss anemia 06-09-2023 from bleeding PICC site. S/p 2 units PRBC transfusion. Initial HgB of 6.2 g/dl. Post-transfusion HgB of 8.5 g/dl.  MSSA  bacteremia 06-09-2023 continue with IV Ancef as already scheduled when he was discharged from the hospital on 05-29-2023.  Cefazolin 2g IV q8h through 07/06/23   Other pancytopenia (HCC) 06-09-2023 oncology consulted. Pt has been dealing with his low platelet count for about 8 months now. Etiology unknown. 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   Pressure injury of skin Present on admission.  Pressure Injury 06/09/23 Buttocks Right;Left;Mid Stage 2 -  Partial thickness loss of dermis presenting as a shallow open injury with a red, pink wound bed without slough. (Active)  06/09/23 0137  Location: Buttocks  Location Orientation: Right;Left;Mid  Staging: Stage 2 -  Partial thickness loss of dermis presenting as a shallow open injury with a red, pink wound bed without slough.  Wound Description (Comments):   Present on Admission: Yes      COVID-19 virus infection 06-09-2023 on RA. Pt is immunosuppressed.  Macular degeneration 06-09-2023 chronic. Stable.  Spinal stenosis of lumbar region with neurogenic claudication 06-09-2023 chronic. Stable. Continue as needed oxycodone.  Gout 06-09-2023 chronic. Stable.  Essential hypertension 06-09-2023 hold ACEI while diuresing. Continue with IV lasix 20 mg bid.  Hypothyroidism 06-09-2023 TSH 7.8. will check FT4.    DVT prophylaxis: SCDs Start: 06/09/23 0010    Code Status: Limited: Do not attempt resuscitation (DNR) -DNR-LIMITED -Do Not Intubate/DNI  Family Communication: discussed with pt's wife at bedside Disposition Plan: SNF Reason for continuing need for hospitalization: on IV lasix. Getting platelet transfusion today.  Objective: Vitals:   06/09/23 0521 06/09/23 0833 06/09/23 1310 06/09/23 1350  BP: (!) 141/60 134/60 (!) 155/47 (!) 154/63  Pulse: (!) 54 (!) 58 63 60  Resp: 15 20 20 18   Temp: 97.8 F (36.6 C) 97.7 F (36.5 C) 97.8 F (36.6 C) 98.2 F (36.8 C)  TempSrc: Oral Oral Oral Oral   SpO2: 99% 97% 99% 100%  Weight:      Height:        Intake/Output Summary (Last 24 hours) at 06/09/2023 1401 Last data filed at 06/09/2023 1357 Gross per 24 hour  Intake 991.35 ml  Output 1750 ml  Net -758.65 ml   Filed Weights   06/08/23 1829 06/09/23 0127  Weight: 83.9 kg 104 kg    Examination:  Physical Exam Vitals and nursing note reviewed.  Constitutional:      Comments: Chronically ill appearing, elderly male.  HENT:     Head: Normocephalic and atraumatic.  Cardiovascular:     Rate and Rhythm: Normal rate and regular rhythm.  Pulmonary:     Effort: Pulmonary effort is normal.     Breath sounds: Normal breath sounds.  Abdominal:     General: Bowel sounds are normal.     Palpations: Abdomen is soft.  Musculoskeletal:     Right lower leg: Edema present.     Left lower leg: Edema present.     Comments: +2 pitting bilateral pretibial, ankle, pedal edema. Pt  is wearing bilateral TED hose  +1 UE pitting edema bilaterally  Skin:    General: Skin is warm and dry.     Capillary Refill: Capillary refill takes less than 2 seconds.  Neurological:     Comments: Agitated and very hard of hearing.     Data Reviewed: I have personally reviewed following labs and imaging studies  CBC: Recent Labs  Lab 06/07/23 1119 06/08/23 2019 06/09/23 0033 06/09/23 0905  WBC 5.2 2.4* 2.6* 3.5*  NEUTROABS 0.7* 0.8*  --  0.5*  HGB 7.0* 6.2* 6.3* 8.5*  HCT 20.9* 19.4* 19.9* 25.8*  MCV 103.5* 108.4* 108.7* 102.8*  PLT 20* 18* 19* 17*   Basic Metabolic Panel: Recent Labs  Lab 06/07/23 1119 06/08/23 2019 06/09/23 0033  NA 134* 133* 133*  K 4.1 4.4 4.5  CL 105 106 105  CO2 24 21* 23  GLUCOSE 103* 141* 139*  BUN 43* 58* 60*  CREATININE 0.92 1.05 1.09  CALCIUM 8.4* 8.3* 8.2*   GFR: Estimated Creatinine Clearance: 57.3 mL/min (by C-G formula based on SCr of 1.09 mg/dL). Liver Function Tests: Recent Labs  Lab 06/07/23 1119 06/08/23 2019 06/09/23 0033  AST 39 45* 43*   ALT 6 10 10   ALKPHOS 229* 189* 196*  BILITOT 1.3* 1.0 1.2  PROT 6.3* 6.0* 5.7*  ALBUMIN 2.2* 1.9* 1.8*   Coagulation Profile: Recent Labs  Lab 06/09/23 0033  INR 1.3*   BNP (last 3 results) Recent Labs    09/09/22 0830  BNP 221.0*   Thyroid Function Tests: Recent Labs    06/09/23 0905  TSH 7.820*   Recent Results (from the past 240 hours)  Resp panel by RT-PCR (RSV, Flu A&B, Covid) Anterior Nasal Swab     Status: Abnormal   Collection Time: 06/08/23  7:01 PM   Specimen: Anterior Nasal Swab  Result Value Ref Range Status   SARS Coronavirus 2 by RT PCR POSITIVE (A) NEGATIVE Final    Comment: (NOTE) SARS-CoV-2 target nucleic acids are DETECTED.  The SARS-CoV-2 RNA is generally detectable in upper respiratory specimens during the acute phase of infection. Positive results are indicative of the presence of the identified virus, but do not rule out bacterial infection or co-infection with other pathogens not detected by the test. Clinical correlation with patient history and other diagnostic information is necessary to determine patient infection status. The expected result is Negative.  Fact Sheet for Patients: BloggerCourse.com  Fact Sheet for Healthcare Providers: SeriousBroker.it  This test is not yet approved or cleared by the [place] FDA and  has been authorized for detection and/or diagnosis of SARS-CoV-2 by FDA under an Emergency Use Authorization (EUA).  This EUA will remain in effect (meaning this test can be used) for the duration of  the COVID-19 declaration under Section 564(b)(1) of the A ct, 21 U.S.C. section 360bbb-3(b)(1), unless the authorization is terminated or revoked sooner.     Influenza A by PCR NEGATIVE NEGATIVE Final   Influenza B by PCR NEGATIVE NEGATIVE Final    Comment: (NOTE) The Xpert Xpress SARS-CoV-2/FLU/RSV plus assay is intended as an aid in the diagnosis of influenza from  Nasopharyngeal swab specimens and should not be used as a sole basis for treatment. Nasal washings and aspirates are unacceptable for Xpert Xpress SARS-CoV-2/FLU/RSV testing.  Fact Sheet for Patients: BloggerCourse.com  Fact Sheet for Healthcare Providers: SeriousBroker.it  This test is not yet approved or cleared by the [place] FDA and has been authorized for detection and/or diagnosis  of SARS-CoV-2 by FDA under an Emergency Use Authorization (EUA). This EUA will remain in effect (meaning this test can be used) for the duration of the COVID-19 declaration under Section 564(b)(1) of the Act, 21 U.S.C. section 360bbb-3(b)(1), unless the authorization is terminated or revoked.     Resp Syncytial Virus by PCR NEGATIVE NEGATIVE Final    Comment: (NOTE) Fact Sheet for Patients: BloggerCourse.com  Fact Sheet for Healthcare Providers: SeriousBroker.it  This test is not yet approved or cleared by the [place] FDA and has been authorized for detection and/or diagnosis of SARS-CoV-2 by FDA under an Emergency Use Authorization (EUA). This EUA will remain in effect (meaning this test can be used) for the duration of the COVID-19 declaration under Section 564(b)(1) of the Act, 21 U.S.C. section 360bbb-3(b)(1), unless the authorization is terminated or revoked.  Performed at [facility], [address] [place], [place] [address]      Radiology Studies: VAS [place] LOWER EXTREMITY VENOUS (DVT) Result Date: 06/09/2023  Lower Venous DVT Study Patient Name:  Samuel Little  Date of Exam:   06/09/2023 Medical Rec #: [id number]       Accession #:    [id number] Date of Birth: 05-27-1932       Patient Gender: M Patient Age:   8 years Exam Location:  [facility] Procedure:      VAS [place] LOWER EXTREMITY VENOUS (DVT) Referring Phys: Midge Minium  --------------------------------------------------------------------------------  Indications: Edema.  Risk Factors: None identified. Limitations: Poor ultrasound/tissue interface and patient positioning. Comparison Study: No prior studies. Performing Technologist: Chanda Busing RVT  Examination Guidelines: A complete evaluation includes B-mode imaging, spectral Doppler, color Doppler, and power Doppler as needed of all accessible portions of each vessel. Bilateral testing is considered an integral part of a complete examination. Limited examinations for reoccurring indications may be performed as noted. The reflux portion of the exam is performed with the patient in reverse Trendelenburg.  +---------+---------------+---------+-----------+----------+-------------------+ RIGHT    CompressibilityPhasicitySpontaneityPropertiesThrombus Aging      +---------+---------------+---------+-----------+----------+-------------------+ CFV      Full           Yes      Yes                                      +---------+---------------+---------+-----------+----------+-------------------+ SFJ      Full                                                             +---------+---------------+---------+-----------+----------+-------------------+ FV Prox  Full                                                             +---------+---------------+---------+-----------+----------+-------------------+ FV Mid                  Yes      Yes                                      +---------+---------------+---------+-----------+----------+-------------------+  FV Distal               Yes      Yes                                      +---------+---------------+---------+-----------+----------+-------------------+ PFV      Full                                                             +---------+---------------+---------+-----------+----------+-------------------+ POP      Full           Yes       Yes                                      +---------+---------------+---------+-----------+----------+-------------------+ PTV      Full                                                             +---------+---------------+---------+-----------+----------+-------------------+ PERO                                                  Not well visualized +---------+---------------+---------+-----------+----------+-------------------+   +---------+---------------+---------+-----------+----------+--------------+ LEFT     CompressibilityPhasicitySpontaneityPropertiesThrombus Aging +---------+---------------+---------+-----------+----------+--------------+ CFV      Full           Yes      Yes                                 +---------+---------------+---------+-----------+----------+--------------+ SFJ      Full                                                        +---------+---------------+---------+-----------+----------+--------------+ FV Prox  Full                                                        +---------+---------------+---------+-----------+----------+--------------+ FV Mid                  Yes      Yes                                 +---------+---------------+---------+-----------+----------+--------------+ FV Distal               Yes      Yes                                 +---------+---------------+---------+-----------+----------+--------------+  PFV      Full                                                        +---------+---------------+---------+-----------+----------+--------------+ POP      Full           Yes      Yes                                 +---------+---------------+---------+-----------+----------+--------------+ PTV      Full                                                        +---------+---------------+---------+-----------+----------+--------------+ PERO     Full                                                         +---------+---------------+---------+-----------+----------+--------------+     Summary: RIGHT: - There is no evidence of deep vein thrombosis in the lower extremity. However, portions of this examination were limited- see technologist comments above.  - No cystic structure found in the popliteal fossa.  LEFT: - There is no evidence of deep vein thrombosis in the lower extremity. However, portions of this examination were limited- see technologist comments above.  - No cystic structure found in the popliteal fossa.  *See table(s) above for measurements and observations. Electronically signed by Carolynn Sayers on 06/09/2023 at 12:48:57 PM.    Final    CT Chest W Contrast Result Date: 06/08/2023 CLINICAL DATA:  Pneumonia with complication suspected. X-ray done. Respiratory illness with nondiagnostic x-ray. Possible COVID. Bilateral pitting edema. EXAM: CT CHEST WITH CONTRAST TECHNIQUE: Multidetector CT imaging of the chest was performed during intravenous contrast administration. RADIATION DOSE REDUCTION: This exam was performed according to the departmental dose-optimization program which includes automated exposure control, adjustment of the mA and/or kV according to patient size and/or use of iterative reconstruction technique. CONTRAST:  75mL OMNIPAQUE IOHEXOL 300 MG/ML  SOLN COMPARISON:  Chest radiograph 06/08/2023.  CT chest 09/09/2022 FINDINGS: Cardiovascular: Cardiac enlargement. No pericardial effusions. Normal caliber thoracic aorta. Calcification of the aorta and coronary arteries. Mediastinum/Nodes: Esophagus is decompressed. Mediastinal lymph nodes are not pathologically enlarged. Lungs/Pleura: Small bilateral pleural effusions are decreased in size since prior CT. Interstitial changes in the lung bases likely representing edema. Linear opacities in the left base are likely linear atelectasis. No pneumothorax. Upper Abdomen: Small amount of upper abdominal ascites. Several stones  demonstrated in both kidneys. Largest visualized on the right is 1.4 cm in diameter. No visualized hydronephrosis although kidneys are not entirely included within the field of view. Hepatic cirrhosis with enlarged lateral segment left lobe and caudate lobe of the liver. Nodular contour to the liver. Spleen is enlarged. Gallbladder is surgically absent. Musculoskeletal: Degenerative changes in the spine. No acute bony abnormalities. IMPRESSION: 1. Small bilateral pleural effusions, decreased since prior CT. 2. Linear atelectasis in the left lung base. 3. Interstitial  pattern to the lung bases likely represents mild edema. 4. Cardiac enlargement. 5. Aortic atherosclerosis. 6. Hepatic cirrhosis with splenic enlargement and upper abdominal ascites. 7. Nonobstructing stones in the kidneys. Electronically Signed   By: Burman Nieves M.D.   On: 06/08/2023 22:28   DG Chest Port 1 View Result Date: 06/08/2023 CLINICAL DATA:  Cough, bleeding from PICC, possible COVID. Bilateral pitting edema in legs. EXAM: PORTABLE CHEST 1 VIEW COMPARISON:  10/27/2022. FINDINGS: Heart is enlarged and the mediastinal contour is within normal limits. There is atherosclerotic calcification of the aorta. There is mild airspace disease at the left lung base with a chronic left pleural effusion. The right lung is clear. A stable opacity is noted in the left upper lobe, which may represent overlapping structures and prominence of the first rib on the left. No pneumothorax. A left-sided PICC line terminates over the superior vena cava. IMPRESSION: Small left pleural effusion with atelectasis or infiltrate at the left lung base. Electronically Signed   By: Thornell Sartorius M.D.   On: 06/08/2023 20:09    Scheduled Meds:  Chlorhexidine Gluconate Cloth  6 each Topical Daily   docusate sodium  100 mg Oral BID   doxazosin  4 mg Oral QHS   feeding supplement  237 mL Oral BID BM   furosemide  20 mg Intravenous Q12H   gabapentin  100 mg Oral QHS    levothyroxine  150 mcg Oral Q0600   lidocaine  1-3 patch Transdermal Q24H   methocarbamol  500 mg Oral QID   multivitamin with minerals  1 tablet Oral Daily   predniSONE  20 mg Oral Q breakfast   Continuous Infusions:   ceFAZolin (ANCEF) IV 2 g (06/09/23 0842)     LOS: 0 days   Time spent: 50 minutes  Carollee Herter, DO  [facility]  06/09/2023, 2:01 PM

## 2023-06-09 NOTE — TOC Initial Note (Signed)
 Transition of Care [facility]) - Initial/Assessment Note    Patient Details  Name: Samuel Little MRN: [id number] Date of Birth: 01-04-1933  Transition of Care [facility]) CM/SW Contact:    Larrie Kass, LCSW Phone Number: 06/09/2023, 9:47 AM  Clinical Narrative:                 Pt is a resident at [facility]. CSW spoke with Lowella Bandy regarding the pt's desire to return. If the pt wishes to return and there is an available bed, a new insurance authorization will be required. Pt will need a PT evaluation. TOC to follow  Expected Discharge Plan:  (TBD) Barriers to Discharge: Continued Medical Work up   Patient Goals and CMS Choice            Expected Discharge Plan and Services                                              Prior Living Arrangements/Services                       Activities of Daily Living   ADL Screening (condition at time of admission) Independently performs ADLs?: No Does the patient have a NEW difficulty with bathing/dressing/toileting/self-feeding that is expected to last >3 days?: Yes (Initiates electronic notice to provider for possible OT consult) Does the patient have a NEW difficulty with getting in/out of bed, walking, or climbing stairs that is expected to last >3 days?: Yes (Initiates electronic notice to provider for possible PT consult) Does the patient have a NEW difficulty with communication that is expected to last >3 days?: No Is the patient deaf or have difficulty hearing?: Yes Does the patient have difficulty seeing, even when wearing glasses/contacts?: Yes Does the patient have difficulty concentrating, remembering, or making decisions?: No  Permission Sought/Granted                  Emotional Assessment              Admission diagnosis:  Anemia [D64.9] Thrombocytopenia (HCC) [D69.6] Anemia, unspecified type [D64.9] COVID [U07.1] Patient Active Problem List   Diagnosis Date Noted    COVID-19 virus infection 06/09/2023   MSSA bacteremia 06/09/2023   Pressure injury of skin 06/09/2023   Bleeding from PICC line, initial encounter (HCC) 06/09/2023   Anemia 06/08/2023   Thrombocytopenia (HCC) 06/07/2023   Macular degeneration 05/25/2023   Spinal stenosis of lumbar region with neurogenic claudication 05/24/2023   AKI (acute kidney injury) (HCC) 05/24/2023   Acute UTI 05/22/2023   Other pancytopenia (HCC) 10/24/2022   Parapneumonic effusion 09/11/2022   Pleuritic chest pain 09/10/2022   Normocytic anemia 09/10/2022   CAP (community acquired pneumonia) 09/09/2022   S/P total knee replacement 02/13/2017   Hyponatremia 06/25/2016   Prolonged QT interval 06/25/2016   Acute cholecystitis 06/24/2016   Diverticulitis of cecum 12/20/2013   BPH (benign prostatic hyperplasia) 12/20/2013   Bacteremia due to Escherichia coli 12/19/2013   Gout 12/18/2013   Sepsis (HCC) 12/18/2013   PSVT 04/23/2009   Hypothyroidism 04/22/2009   Essential hypertension 04/22/2009   GERD 04/22/2009   Arthropathy 04/22/2009   PCP:  Georgann Housekeeper, MD Pharmacy:   [facility] [address] [place] - [address] [place] [address] Phone: [phone] Fax: [phone]     Social Drivers  of Health (SDOH) Social History: SDOH Screenings   Food Insecurity: No Food Insecurity (06/09/2023)  Housing: Low Risk  (06/09/2023)  Transportation Needs: No Transportation Needs (06/09/2023)  Utilities: Not At Risk (06/09/2023)  Social Connections: Moderately Integrated (06/09/2023)  Tobacco Use: Low Risk  (06/09/2023)   SDOH Interventions:     Readmission Risk Interventions     No data to display

## 2023-06-09 NOTE — Assessment & Plan Note (Addendum)
 06-09-2023 continue with IV lasix to help with edema. Pt's low serum albumin is likely contributing to his edema.  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  Prognosis is terminal.  Stop IV lasix so the patient will not have the urge to urinate so frequently.

## 2023-06-09 NOTE — Assessment & Plan Note (Addendum)
 06-09-2023 chronic. Stable. Continue as needed oxycodone.  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  Prognosis is terminal. Pt now on IV morphine prn.

## 2023-06-09 NOTE — Plan of Care (Signed)

## 2023-06-09 NOTE — Assessment & Plan Note (Signed)
 06-09-2023 on RA. Pt is immunosuppressed.

## 2023-06-10 DIAGNOSIS — C9200 Acute myeloblastic leukemia, not having achieved remission: Secondary | ICD-10-CM | POA: Diagnosis present

## 2023-06-10 DIAGNOSIS — D61818 Other pancytopenia: Secondary | ICD-10-CM

## 2023-06-10 DIAGNOSIS — Z7189 Other specified counseling: Secondary | ICD-10-CM

## 2023-06-10 DIAGNOSIS — Z515 Encounter for palliative care: Secondary | ICD-10-CM | POA: Diagnosis not present

## 2023-06-10 DIAGNOSIS — J90 Pleural effusion, not elsewhere classified: Secondary | ICD-10-CM | POA: Diagnosis present

## 2023-06-10 DIAGNOSIS — J9811 Atelectasis: Secondary | ICD-10-CM | POA: Diagnosis present

## 2023-06-10 DIAGNOSIS — D689 Coagulation defect, unspecified: Secondary | ICD-10-CM | POA: Diagnosis present

## 2023-06-10 DIAGNOSIS — E039 Hypothyroidism, unspecified: Secondary | ICD-10-CM | POA: Diagnosis present

## 2023-06-10 DIAGNOSIS — L89153 Pressure ulcer of sacral region, stage 3: Secondary | ICD-10-CM | POA: Diagnosis present

## 2023-06-10 DIAGNOSIS — R188 Other ascites: Secondary | ICD-10-CM | POA: Diagnosis present

## 2023-06-10 DIAGNOSIS — M109 Gout, unspecified: Secondary | ICD-10-CM | POA: Diagnosis present

## 2023-06-10 DIAGNOSIS — M48062 Spinal stenosis, lumbar region with neurogenic claudication: Secondary | ICD-10-CM

## 2023-06-10 DIAGNOSIS — R4589 Other symptoms and signs involving emotional state: Secondary | ICD-10-CM | POA: Diagnosis not present

## 2023-06-10 DIAGNOSIS — N390 Urinary tract infection, site not specified: Secondary | ICD-10-CM | POA: Diagnosis present

## 2023-06-10 DIAGNOSIS — U071 COVID-19: Secondary | ICD-10-CM | POA: Diagnosis present

## 2023-06-10 DIAGNOSIS — K5900 Constipation, unspecified: Secondary | ICD-10-CM

## 2023-06-10 DIAGNOSIS — L89312 Pressure ulcer of right buttock, stage 2: Secondary | ICD-10-CM | POA: Diagnosis present

## 2023-06-10 DIAGNOSIS — R52 Pain, unspecified: Secondary | ICD-10-CM | POA: Diagnosis not present

## 2023-06-10 DIAGNOSIS — F419 Anxiety disorder, unspecified: Secondary | ICD-10-CM | POA: Diagnosis present

## 2023-06-10 DIAGNOSIS — Y838 Other surgical procedures as the cause of abnormal reaction of the patient, or of later complication, without mention of misadventure at the time of the procedure: Secondary | ICD-10-CM | POA: Diagnosis present

## 2023-06-10 DIAGNOSIS — D61811 Other drug-induced pancytopenia: Secondary | ICD-10-CM | POA: Diagnosis present

## 2023-06-10 DIAGNOSIS — I119 Hypertensive heart disease without heart failure: Secondary | ICD-10-CM | POA: Diagnosis present

## 2023-06-10 DIAGNOSIS — Y828 Other medical devices associated with adverse incidents: Secondary | ICD-10-CM | POA: Diagnosis present

## 2023-06-10 DIAGNOSIS — Z789 Other specified health status: Secondary | ICD-10-CM | POA: Diagnosis not present

## 2023-06-10 DIAGNOSIS — D619 Aplastic anemia, unspecified: Secondary | ICD-10-CM | POA: Diagnosis not present

## 2023-06-10 DIAGNOSIS — Z66 Do not resuscitate: Secondary | ICD-10-CM | POA: Diagnosis present

## 2023-06-10 DIAGNOSIS — D849 Immunodeficiency, unspecified: Secondary | ICD-10-CM | POA: Diagnosis present

## 2023-06-10 DIAGNOSIS — D62 Acute posthemorrhagic anemia: Secondary | ICD-10-CM | POA: Diagnosis present

## 2023-06-10 DIAGNOSIS — T82838A Hemorrhage of vascular prosthetic devices, implants and grafts, initial encounter: Secondary | ICD-10-CM | POA: Diagnosis present

## 2023-06-10 DIAGNOSIS — L89322 Pressure ulcer of left buttock, stage 2: Secondary | ICD-10-CM | POA: Diagnosis present

## 2023-06-10 DIAGNOSIS — I700 Atherosclerosis of aorta: Secondary | ICD-10-CM | POA: Diagnosis present

## 2023-06-10 DIAGNOSIS — T82524A Displacement of infusion catheter, initial encounter: Secondary | ICD-10-CM | POA: Diagnosis present

## 2023-06-10 DIAGNOSIS — R7881 Bacteremia: Secondary | ICD-10-CM | POA: Diagnosis present

## 2023-06-10 DIAGNOSIS — K7460 Unspecified cirrhosis of liver: Secondary | ICD-10-CM | POA: Diagnosis present

## 2023-06-10 LAB — CBC WITH DIFFERENTIAL/PLATELET
Abs Immature Granulocytes: 0.29 10*3/uL — ABNORMAL HIGH (ref 0.00–0.07)
Basophils Absolute: 0 10*3/uL (ref 0.0–0.1)
Basophils Relative: 0 %
Eosinophils Absolute: 0 10*3/uL (ref 0.0–0.5)
Eosinophils Relative: 0 %
HCT: 23.7 % — ABNORMAL LOW (ref 39.0–52.0)
Hemoglobin: 7.7 g/dL — ABNORMAL LOW (ref 13.0–17.0)
Immature Granulocytes: 10 %
Lymphocytes Relative: 35 %
Lymphs Abs: 1 10*3/uL (ref 0.7–4.0)
MCH: 33.6 pg (ref 26.0–34.0)
MCHC: 32.5 g/dL (ref 30.0–36.0)
MCV: 103.5 fL — ABNORMAL HIGH (ref 80.0–100.0)
Monocytes Absolute: 1 10*3/uL (ref 0.1–1.0)
Monocytes Relative: 34 %
Neutro Abs: 0.6 10*3/uL — ABNORMAL LOW (ref 1.7–7.7)
Neutrophils Relative %: 21 %
Platelets: 22 10*3/uL — CL (ref 150–400)
RBC: 2.29 MIL/uL — ABNORMAL LOW (ref 4.22–5.81)
RDW: 21.1 % — ABNORMAL HIGH (ref 11.5–15.5)
WBC: 3 10*3/uL — ABNORMAL LOW (ref 4.0–10.5)
nRBC: 3 % — ABNORMAL HIGH (ref 0.0–0.2)

## 2023-06-10 LAB — COMPREHENSIVE METABOLIC PANEL WITH GFR
ALT: 9 U/L (ref 0–44)
AST: 39 U/L (ref 15–41)
Albumin: 1.8 g/dL — ABNORMAL LOW (ref 3.5–5.0)
Alkaline Phosphatase: 174 U/L — ABNORMAL HIGH (ref 38–126)
Anion gap: 7 (ref 5–15)
BUN: 58 mg/dL — ABNORMAL HIGH (ref 8–23)
CO2: 24 mmol/L (ref 22–32)
Calcium: 8.3 mg/dL — ABNORMAL LOW (ref 8.9–10.3)
Chloride: 103 mmol/L (ref 98–111)
Creatinine, Ser: 1.06 mg/dL (ref 0.61–1.24)
GFR, Estimated: >60 mL/min (ref >60)
Glucose, Bld: 82 mg/dL (ref 70–99)
Potassium: 3.8 mmol/L (ref 3.5–5.1)
Sodium: 134 mmol/L — ABNORMAL LOW (ref 135–145)
Total Bilirubin: 1.3 mg/dL — ABNORMAL HIGH (ref 0.0–1.2)
Total Protein: 5.6 g/dL — ABNORMAL LOW (ref 6.5–8.1)

## 2023-06-10 LAB — HAPTOGLOBIN: Haptoglobin: 109 mg/dL (ref 38–329)

## 2023-06-10 LAB — MAGNESIUM: Magnesium: 1.8 mg/dL (ref 1.7–2.4)

## 2023-06-10 LAB — T4, FREE: Free T4: 0.77 ng/dL (ref 0.61–1.12)

## 2023-06-10 MED ORDER — OXYCODONE HCL 5 MG PO TABS
5.0000 mg | ORAL_TABLET | ORAL | Status: DC | PRN
  Administered 2023-06-10: 10 mg via ORAL
  Administered 2023-06-11: 5 mg via ORAL
  Filled 2023-06-10 (×2): qty 2
  Filled 2023-06-10: qty 1
  Filled 2023-06-10: qty 2

## 2023-06-10 MED ORDER — MORPHINE SULFATE (PF) 2 MG/ML IV SOLN: 1.0000 mg | INTRAVENOUS | Status: DC | PRN

## 2023-06-10 MED ORDER — POLYETHYLENE GLYCOL 3350 17 G PO PACK
17.0000 g | PACK | Freq: Every day | ORAL | Status: DC
  Administered 2023-06-10 – 2023-06-11 (×2): 17 g via ORAL
  Filled 2023-06-10 (×2): qty 1

## 2023-06-10 MED ORDER — SENNA 8.6 MG PO TABS
2.0000 | ORAL_TABLET | Freq: Two times a day (BID) | ORAL | Status: DC
  Administered 2023-06-10 – 2023-06-11 (×3): 17.2 mg via ORAL
  Filled 2023-06-10 (×3): qty 2

## 2023-06-10 NOTE — Progress Notes (Signed)
 PT Cancellation Note  Patient Details Name: Samuel Little MRN: [id number] DOB: [birth year]   Cancelled Treatment:    Reason Eval/Treat Not Completed: Medical issues which prohibited therapy Noted in oncology note, pt with poor prognosis and to have GOC conversation with family.  Spoke with RN who reports pt in increased pain and not appropriate for PT.  Will f/u later date to determine if continue therapy vs sign off pending GOC.  Anise Salvo, PT Acute Re[facility] [phone]   Samuel Little 06/10/2023, 11:44 AM

## 2023-06-10 NOTE — Progress Notes (Signed)
 [facility]  HEMATOLOGY/ONCOLOGY INPATIENT PROGRESS NOTE  Date of Service: 06/10/2023  Inpatient Attending: .Carollee Herter, DO   SUBJECTIVE  Patient was seen in hematology follow-up at the request of Dr. Shirline Frees for newly diagnosed acute myeloid leukemia with monocytic differentiation with more than 50% blasts in the peripheral blood.  Patient has a history of hypertension, hypothyroidism, AVNTR status post ablation, severe neurogenic claudications from significant lumbar spinal stenosis, recently noted MSSA bacteremia on cefazolin through PICC line plan till 07/06/2023.  He was brought into the hospital due to uncontrolled bleeding at the PICC line insertion site after the patient had dislodged [name] to the right upper extremity.  He has had issues with chronic and worsening pancytopenia and on admission had a hemoglobin of 6.2 and platelets of 18,000 and a WBC count of 2.4k. Patient previously had a workup for pancytopenia with a bone marrow biopsy on 11/08/2022 which showed a mildly hypercellular Bone marrow with 30% cellularity.  No overt dysplasia or increased blasts were noted at the time.  On peripheral blood smear evaluation on 06/08/2023 the patient was noted to have 50% blasts on his peripheral blood. Subsequently an urgent flow cytometry was done on 06/08/2023 which was consistent with acute myeloid leukemia with monocytic differentiation with 50% blasts in the peripheral blood.  Patient also currently was noted to have COVID infection though he is on room air and does not have significant respiratory symptoms from that.  I discussed the new diagnosis, natural history, treatment considerations in the context of his age and other medical issues in details with his wife and daughter at bedside as well as with the patient.  The patient is very clear that he would does not want to go through aggressive treatments and is focused on being kept comfortable. His wife also very clearly notes that  she wants him to be kept comfortable and does not want to spend his remaining time going back and forth to the cancer center or the hospital.   Patient wife and daughter were given a clear understanding that his condition is likely to go downhill very quickly and that if there are other family members that would like to meet him and spend time with him this should do so at their earliest.  After extensive goals of care discussion the patient wife and daughter are in agreement that they would like to work towards logistics of transitioning him to best supportive cares through hospice. They wonder if he could continue his PRBC and platelet transfusions for now till the logistics can be worked out and other family members could meet him and we discussed that that should be okay.  They are agreeable with having palliative care and social worker involved to help work on the logistics of transitioning him to residential hospice either with his current facility at [facility] if they have the facility or consideration of [facility].   OBJECTIVE:  NAD  PHYSICAL EXAMINATION: . Vitals:   06/09/23 1310 06/09/23 1350 06/09/23 1600 06/09/23 2028  BP: (!) 155/47 (!) 154/63 136/86 (!) 131/52  Pulse: 63 60 66 63  Resp: 20 18 18 16   Temp: 97.8 F (36.6 C) 98.2 F (36.8 C) 98.1 F (36.7 C) 98 F (36.7 C)  TempSrc: Oral Oral Oral Oral  SpO2: 99% 100% 99% 98%  Weight:      Height:       Filed Weights   06/08/23 1829 06/09/23 0127  Weight: 185 lb (83.9 kg) 229 lb 3.2  oz (104 kg)   .Body mass index is 31.09 kg/m.  GENERAL:alert, HOH,  LYMPH:  no palpable lymphadenopathy in the cervical, axillary or inguinal LUNGS: clear to auscultation HEART: regular rate & rhythm ABDOMEN: abdomen soft, non-tender, normoactive bowel sounds , mild splenomegaly PSYCH: alert & oriented x 3 with fluent speech NEURO: Moving all 4 extremities  MEDICAL HISTORY:  Past Medical History:  Diagnosis Date    Arrhythmia    h/o atrioventricular node reetrant tachycardia (status post radio frequecy catheter ablation , march 2,2011    Arthritis    Diverticulosis    Effusion, pericardium    Gastroesophageal reflux disease    Gout    Hard of hearing    wears bilateral hearing aids   History of kidney stones    Hypertension    Hypothyroidism    Macular degeneration    Pleural effusion, left    Right bundle branch block    Thyroid disease    hypothyroidism   Wears glasses     SURGICAL HISTORY: Past Surgical History:  Procedure Laterality Date   ARTHROSCOPY KNEE W/ DRILLING Right    CARDIAC CATHETERIZATION  2007   CARDIAC ELECTROPHYSIOLOGY STUDY AND ABLATION     CATARACT EXTRACTION     CHEST TUBE INSERTION     CHOLECYSTECTOMY  06/26/2016   Procedure: LAPAROSCOPIC CHOLECYSTECTOMY  subtotal;  Surgeon: Almond Lint, MD;  Location: [facility] OR;  Service: General;;   FRACTURE SURGERY Right 2001   right leg   HERNIA REPAIR     IR THORACENTESIS ASP PLEURAL SPACE W/IMG GUIDE  09/09/2022   MASS EXCISION Right 09/22/2015   Procedure: RIGHT THUMB EXCISION MASS;  Surgeon: Betha Loa, MD;  Location: [facility];  Service: Orthopedics;  Laterality: Right;   ROTATOR CUFF REPAIR     ROTATOR CUFF REPAIR Right 2002   TONSILLECTOMY     TOTAL KNEE ARTHROPLASTY Right 02/13/2017   Procedure: TOTAL KNEE ARTHROPLASTY;  Surgeon: Dannielle Huh, MD;  Location: [facility] OR;  Service: Orthopedics;  Laterality: Right;    SOCIAL HISTORY: Social History   Socioeconomic History   Marital status: Married    Spouse name: Not on file   Number of children: Not on file   Years of education: Not on file   Highest education level: Not on file  Occupational History   Not on file  Tobacco Use   Smoking status: Never   Smokeless tobacco: Never  Substance and Sexual Activity   Alcohol use: No   Drug use: No   Sexual activity: Not on file  Other Topics Concern   Not on file  Social History Narrative   Not on  file   Social Drivers of Health   Financial Resource Strain: Not on file  Food Insecurity: No Food Insecurity (06/09/2023)   Hunger Vital Sign    Worried About Running Out of Food in the Last Year: Never true    Ran Out of Food in the Last Year: Never true  Transportation Needs: No Transportation Needs (06/09/2023)   PRAPARE - Administrator, Civil Service (Medical): No    Lack of Transportation (Non-Medical): No  Physical Activity: Not on file  Stress: Not on file  Social Connections: Moderately Integrated (06/09/2023)   Social Connection and Isolation Panel [NHANES]    Frequency of Communication with Friends and Family: More than three times a week    Frequency of Social Gatherings with Friends and Family: More than three times a week  Attends Religious Services: More than 4 times per year    Active Member of Clubs or Organizations: No    Attends Banker Meetings: Never    Marital Status: Married  Catering manager Violence: Not At Risk (06/09/2023)   Humiliation, Afraid, Rape, and Kick questionnaire    Fear of Current or Ex-Partner: No    Emotionally Abused: No    Physically Abused: No    Sexually Abused: No    FAMILY HISTORY: Family History  Problem Relation Age of Onset   Hypertension Mother    Diabetes Mother    Hypertension Father    Heart attack Father    Diabetes Father    Heart attack Brother    Neuropathy Brother    Heart attack Paternal Uncle    Heart attack Paternal Uncle    Heart attack Paternal Uncle    Other Son        Leak in aorta    ALLERGIES:  is allergic to nsaids, celecoxib, and meperidine hcl.  MEDICATIONS:  Scheduled Meds:  Chlorhexidine Gluconate Cloth  6 each Topical Daily   docusate sodium  100 mg Oral BID   doxazosin  4 mg Oral QHS   feeding supplement  237 mL Oral BID BM   gabapentin  100 mg Oral QHS   levothyroxine  150 mcg Oral Q0600   lidocaine  1-3 patch Transdermal Q24H   methocarbamol  500 mg Oral QID    multivitamin with minerals  1 tablet Oral Daily   predniSONE  20 mg Oral Q breakfast   Continuous Infusions:   ceFAZolin (ANCEF) IV 2 g (06/10/23 1047)   PRN Meds:.naphazoline-glycerin, oxyCODONE, sodium chloride flush  REVIEW OF SYSTEMS:    10 Point review of Systems was done is negative except as noted above.   LABORATORY DATA:  I have reviewed the data as listed  .    Latest Ref Rng & Units 06/10/2023    4:21 AM 06/09/2023    9:05 AM 06/09/2023   12:33 AM  CBC  WBC 4.0 - 10.5 K/uL 3.0  3.5  2.6   Hemoglobin 13.0 - 17.0 g/dL 7.7  8.5  6.3   Hematocrit 39.0 - 52.0 % 23.7  25.8  19.9   Platelets 150 - 400 K/uL 22  17  19      .    Latest Ref Rng & Units 06/10/2023    4:21 AM 06/09/2023   12:33 AM 06/08/2023    8:19 PM  CMP  Glucose 70 - 99 mg/dL 82  295  284   BUN 8 - 23 mg/dL 58  60  58   Creatinine 0.61 - 1.24 mg/dL 1.32  4.40  1.02   Sodium 135 - 145 mmol/L 134  133  133   Potassium 3.5 - 5.1 mmol/L 3.8  4.5  4.4   Chloride 98 - 111 mmol/L 103  105  106   CO2 22 - 32 mmol/L 24  23  21    Calcium 8.9 - 10.3 mg/dL 8.3  8.2  8.3   Total Protein 6.5 - 8.1 g/dL 5.6  5.7  6.0   Total Bilirubin 0.0 - 1.2 mg/dL 1.3  1.2  1.0   Alkaline Phos 38 - 126 U/L 174  196  189   AST 15 - 41 U/L 39  43  45   ALT 0 - 44 U/L 9  10  10       RADIOGRAPHIC STUDIES: I have personally reviewed the radiological  images as listed and agreed with the findings in the report. VAS [place] LOWER EXTREMITY VENOUS (DVT) Result Date: 06/09/2023  Lower Venous DVT Study Patient Name:  VIKRANT PRYCE  Date of Exam:   06/09/2023 Medical Rec #: [id number]       Accession #:    [id number] Date of Birth: 03/19/1932       Patient Gender: M Patient Age:   58 years Exam Location:  [facility] Procedure:      VAS [place] LOWER EXTREMITY VENOUS (DVT) Referring Phys: Midge Minium --------------------------------------------------------------------------------  Indications: Edema.  Risk Factors: None identified.  Limitations: Poor ultrasound/tissue interface and patient positioning. Comparison Study: No prior studies. Performing Technologist: Chanda Busing RVT  Examination Guidelines: A complete evaluation includes B-mode imaging, spectral Doppler, color Doppler, and power Doppler as needed of all accessible portions of each vessel. Bilateral testing is considered an integral part of a complete examination. Limited examinations for reoccurring indications may be performed as noted. The reflux portion of the exam is performed with the patient in reverse Trendelenburg.  +---------+---------------+---------+-----------+----------+-------------------+ RIGHT    CompressibilityPhasicitySpontaneityPropertiesThrombus Aging      +---------+---------------+---------+-----------+----------+-------------------+ CFV      Full           Yes      Yes                                      +---------+---------------+---------+-----------+----------+-------------------+ SFJ      Full                                                             +---------+---------------+---------+-----------+----------+-------------------+ FV Prox  Full                                                             +---------+---------------+---------+-----------+----------+-------------------+ FV Mid                  Yes      Yes                                      +---------+---------------+---------+-----------+----------+-------------------+ FV Distal               Yes      Yes                                      +---------+---------------+---------+-----------+----------+-------------------+ PFV      Full                                                             +---------+---------------+---------+-----------+----------+-------------------+ POP      Full  Yes      Yes                                      +---------+---------------+---------+-----------+----------+-------------------+ PTV       Full                                                             +---------+---------------+---------+-----------+----------+-------------------+ PERO                                                  Not well visualized +---------+---------------+---------+-----------+----------+-------------------+   +---------+---------------+---------+-----------+----------+--------------+ LEFT     CompressibilityPhasicitySpontaneityPropertiesThrombus Aging +---------+---------------+---------+-----------+----------+--------------+ CFV      Full           Yes      Yes                                 +---------+---------------+---------+-----------+----------+--------------+ SFJ      Full                                                        +---------+---------------+---------+-----------+----------+--------------+ FV Prox  Full                                                        +---------+---------------+---------+-----------+----------+--------------+ FV Mid                  Yes      Yes                                 +---------+---------------+---------+-----------+----------+--------------+ FV Distal               Yes      Yes                                 +---------+---------------+---------+-----------+----------+--------------+ PFV      Full                                                        +---------+---------------+---------+-----------+----------+--------------+ POP      Full           Yes      Yes                                 +---------+---------------+---------+-----------+----------+--------------+ PTV  Full                                                        +---------+---------------+---------+-----------+----------+--------------+ PERO     Full                                                        +---------+---------------+---------+-----------+----------+--------------+     Summary: RIGHT: - There is  no evidence of deep vein thrombosis in the lower extremity. However, portions of this examination were limited- see technologist comments above.  - No cystic structure found in the popliteal fossa.  LEFT: - There is no evidence of deep vein thrombosis in the lower extremity. However, portions of this examination were limited- see technologist comments above.  - No cystic structure found in the popliteal fossa.  *See table(s) above for measurements and observations. Electronically signed by Carolynn Sayers on 06/09/2023 at 12:48:57 PM.    Final    CT Chest W Contrast Result Date: 06/08/2023 CLINICAL DATA:  Pneumonia with complication suspected. X-ray done. Respiratory illness with nondiagnostic x-ray. Possible COVID. Bilateral pitting edema. EXAM: CT CHEST WITH CONTRAST TECHNIQUE: Multidetector CT imaging of the chest was performed during intravenous contrast administration. RADIATION DOSE REDUCTION: This exam was performed according to the departmental dose-optimization program which includes automated exposure control, adjustment of the mA and/or kV according to patient size and/or use of iterative reconstruction technique. CONTRAST:  75mL OMNIPAQUE IOHEXOL 300 MG/ML  SOLN COMPARISON:  Chest radiograph 06/08/2023.  CT chest 09/09/2022 FINDINGS: Cardiovascular: Cardiac enlargement. No pericardial effusions. Normal caliber thoracic aorta. Calcification of the aorta and coronary arteries. Mediastinum/Nodes: Esophagus is decompressed. Mediastinal lymph nodes are not pathologically enlarged. Lungs/Pleura: Small bilateral pleural effusions are decreased in size since prior CT. Interstitial changes in the lung bases likely representing edema. Linear opacities in the left base are likely linear atelectasis. No pneumothorax. Upper Abdomen: Small amount of upper abdominal ascites. Several stones demonstrated in both kidneys. Largest visualized on the right is 1.4 cm in diameter. No visualized hydronephrosis although kidneys  are not entirely included within the field of view. Hepatic cirrhosis with enlarged lateral segment left lobe and caudate lobe of the liver. Nodular contour to the liver. Spleen is enlarged. Gallbladder is surgically absent. Musculoskeletal: Degenerative changes in the spine. No acute bony abnormalities. IMPRESSION: 1. Small bilateral pleural effusions, decreased since prior CT. 2. Linear atelectasis in the left lung base. 3. Interstitial pattern to the lung bases likely represents mild edema. 4. Cardiac enlargement. 5. Aortic atherosclerosis. 6. Hepatic cirrhosis with splenic enlargement and upper abdominal ascites. 7. Nonobstructing stones in the kidneys. Electronically Signed   By: Burman Nieves M.D.   On: 06/08/2023 22:28   DG Chest Port 1 View Result Date: 06/08/2023 CLINICAL DATA:  Cough, bleeding from PICC, possible COVID. Bilateral pitting edema in legs. EXAM: PORTABLE CHEST 1 VIEW COMPARISON:  10/27/2022. FINDINGS: Heart is enlarged and the mediastinal contour is within normal limits. There is atherosclerotic calcification of the aorta. There is mild airspace disease at the left lung base with a chronic left pleural effusion. The right lung is clear. A stable opacity is noted in the left upper  lobe, which may represent overlapping structures and prominence of the first rib on the left. No pneumothorax. A left-sided PICC line terminates over the superior vena cava. IMPRESSION: Small left pleural effusion with atelectasis or infiltrate at the left lung base. Electronically Signed   By: Thornell Sartorius M.D.   On: 06/08/2023 20:09   [place] EKG SITE RITE Result Date: 05/29/2023 If Site Rite image not attached, placement could not be confirmed due to current cardiac rhythm.  CT MAXILLOFACIAL W CONTRAST Result Date: 05/26/2023 CLINICAL DATA:  Abscess EXAM: CT MAXILLOFACIAL WITH CONTRAST TECHNIQUE: Multidetector CT imaging of the maxillofacial structures was performed with intravenous contrast. Multiplanar CT  image reconstructions were also generated. RADIATION DOSE REDUCTION: This exam was performed according to the departmental dose-optimization program which includes automated exposure control, adjustment of the mA and/or kV according to patient size and/or use of iterative reconstruction technique. CONTRAST:  75mL OMNIPAQUE IOHEXOL 350 MG/ML SOLN COMPARISON:  None Available. FINDINGS: Osseous: No fracture or mandibular dislocation. No destructive process. Orbits: Negative. No traumatic or inflammatory finding. Sinuses: Clear. Soft tissues: Negative. Limited intracranial: No significant or unexpected finding. IMPRESSION: No acute abnormality of the face. Electronically Signed   By: Deatra Robinson M.D.   On: 05/26/2023 00:31   ECHOCARDIOGRAM COMPLETE Result Date: 05/24/2023    ECHOCARDIOGRAM REPORT   Patient Name:   JAMEY HARMAN Date of Exam: 05/24/2023 Medical Rec #:  [id number]      Height:       73.0 in Accession #:    [id number]     Weight:       190.7 lb Date of Birth:  04-25-1932      BSA:          2.109 m Patient Age:    89 years       BP:           120/57 mmHg Patient Gender: M              HR:           74 bpm. Exam Location:  Inpatient Procedure: 2D Echo, Cardiac Doppler, Color Doppler and Intracardiac            Opacification Agent (Both Spectral and Color Flow Doppler were            utilized during procedure). Indications:    Endocarditis  History:        Patient has prior history of Echocardiogram examinations, most                 recent 09/10/2022. Risk Factors:Hypertension.  Sonographer:    Amy Chionchio Referring Phys: [id number] [facility] IMPRESSIONS  1. Left ventricular ejection fraction, by estimation, is 60 to 65%. The left ventricle has normal function. The left ventricle has no regional wall motion abnormalities. There is mild concentric left ventricular hypertrophy. Left ventricular diastolic parameters were normal.  2. Right ventricular systolic function is normal. The right ventricular size  is normal.  3. The mitral valve is degenerative. Trivial mitral valve regurgitation. No evidence of mitral stenosis.  4. Moderate tricuspid stenosis.  5. The aortic valve is tricuspid. There is mild calcification of the aortic valve. Aortic valve regurgitation is trivial. Aortic valve sclerosis/calcification is present, without any evidence of aortic stenosis.  6. Aortic dilatation noted. There is mild dilatation of the ascending aorta, measuring 43 mm. There is borderline dilatation of the aortic root, measuring 39 mm.  7. The inferior vena cava is normal in size  with <50% respiratory variability, suggesting right atrial pressure of 8 mmHg. FINDINGS  Left Ventricle: Left ventricular ejection fraction, by estimation, is 60 to 65%. The left ventricle has normal function. The left ventricle has no regional wall motion abnormalities. Definity contrast agent was given IV to delineate the left ventricular  endocardial borders. The left ventricular internal cavity size was normal in size. There is mild concentric left ventricular hypertrophy. Left ventricular diastolic parameters were normal. Right Ventricle: The right ventricular size is normal. No increase in right ventricular wall thickness. Right ventricular systolic function is normal. Left Atrium: Left atrial size was normal in size. Right Atrium: Right atrial size was normal in size. Pericardium: There is no evidence of pericardial effusion. Mitral Valve: The mitral valve is degenerative in appearance. There is mild calcification of the mitral valve leaflet(s). Trivial mitral valve regurgitation. No evidence of mitral valve stenosis. MV peak gradient, 2.4 mmHg. The mean mitral valve gradient  is 1.0 mmHg. Tricuspid Valve: The tricuspid valve is normal in structure. Tricuspid valve regurgitation is trivial. Moderate tricuspid stenosis. Aortic Valve: The aortic valve is tricuspid. There is mild calcification of the aortic valve. Aortic valve regurgitation is trivial.  Aortic valve sclerosis/calcification is present, without any evidence of aortic stenosis. Aortic valve mean gradient measures 6.0 mmHg. Aortic valve peak gradient measures 12.0 mmHg. Aortic valve area, by VTI measures 2.33 cm. Pulmonic Valve: The pulmonic valve was normal in structure. Pulmonic valve regurgitation is trivial. No evidence of pulmonic stenosis. Aorta: Aortic dilatation noted. There is mild dilatation of the ascending aorta, measuring 43 mm. There is borderline dilatation of the aortic root, measuring 39 mm. Venous: The inferior vena cava is normal in size with less than 50% respiratory variability, suggesting right atrial pressure of 8 mmHg. IAS/Shunts: No atrial level shunt detected by color flow Doppler.  LEFT VENTRICLE PLAX 2D LVIDd:         5.20 cm      Diastology LVIDs:         3.70 cm      LV e' medial:    9.25 cm/s LV PW:         1.00 cm      LV E/e' medial:  8.7 LV IVS:        1.10 cm      LV e' lateral:   11.00 cm/s LVOT diam:     2.10 cm      LV E/e' lateral: 7.3 LV SV:         77 LV SV Index:   36 LVOT Area:     3.46 cm  LV Volumes (MOD) LV vol d, MOD A2C: 164.0 ml LV vol d, MOD A4C: 143.0 ml LV vol s, MOD A2C: 73.2 ml LV vol s, MOD A4C: 61.6 ml LV SV MOD A2C:     90.8 ml LV SV MOD A4C:     143.0 ml LV SV MOD BP:      97.2 ml RIGHT VENTRICLE             IVC RV Basal diam:  3.30 cm     IVC diam: 2.00 cm RV S prime:     11.40 cm/s TAPSE (M-mode): 1.7 cm LEFT ATRIUM              Index        RIGHT ATRIUM           Index LA Vol (A2C):   109.0 ml 51.70 ml/m  RA Area:  12.20 cm LA Vol (A4C):   93.8 ml  44.49 ml/m  RA Volume:   23.30 ml  11.05 ml/m LA Biplane Vol: 103.0 ml 48.85 ml/m  AORTIC VALVE                     PULMONIC VALVE AV Area (Vmax):    2.18 cm      PV Vmax:       0.86 m/s AV Area (Vmean):   2.10 cm      PV Peak grad:  3.0 mmHg AV Area (VTI):     2.33 cm AV Vmax:           173.00 cm/s AV Vmean:          119.000 cm/s AV VTI:            0.330 m AV Peak Grad:      12.0 mmHg  AV Mean Grad:      6.0 mmHg LVOT Vmax:         109.00 cm/s LVOT Vmean:        72.200 cm/s LVOT VTI:          0.222 m LVOT/AV VTI ratio: 0.67  AORTA Ao Root diam: 3.90 cm Ao Asc diam:  4.25 cm MITRAL VALVE MV Area (PHT): 3.40 cm    SHUNTS MV Area VTI:   3.46 cm    Systemic VTI:  0.22 m MV Peak grad:  2.4 mmHg    Systemic Diam: 2.10 cm MV Mean grad:  1.0 mmHg MV Vmax:       0.78 m/s MV Vmean:      45.5 cm/s MV Decel Time: 223 msec MV E velocity: 80.70 cm/s MV A velocity: 48.20 cm/s MV E/A ratio:  1.67 Arvilla Meres MD Electronically signed by Arvilla Meres MD Signature Date/Time: 05/24/2023/12:45:28 PM    Final    MR THORACIC SPINE WO CONTRAST Result Date: 05/24/2023 CLINICAL DATA:  Infection EXAM: MRI THORACIC AND LUMBAR SPINE WITHOUT CONTRAST TECHNIQUE: Multiplanar and multiecho pulse sequences of the thoracic and lumbar spine were obtained without intravenous contrast. COMPARISON:  01/18/2022 lumbar spine MRI FINDINGS: MRI THORACIC SPINE FINDINGS Alignment:  Physiologic. Vertebrae: No fracture, evidence of discitis, or bone lesion. Cord:  Normal signal and morphology. Paraspinal and other soft tissues: Negative. Disc levels: T2-3: Small disc bulge without spinal canal stenosis. No other spinal canal or neural foraminal stenosis. MRI LUMBAR SPINE FINDINGS Segmentation:  Standard. Alignment:  Grade 1 retrolisthesis at L1-2, L2-3 and L3-4 Vertebrae:  No fracture, evidence of discitis, or bone lesion. Conus medullaris and cauda equina: Conus extends to the L1 level. Conus and cauda equina appear normal. Paraspinal and other soft tissues: Negative. Disc levels: L1-L2: Small disc bulge with endplate spurring. No spinal canal stenosis. Severe right and mild left neural foraminal stenosis. L2-L3: Small disc bulge. No spinal canal stenosis. No neural foraminal stenosis. L3-L4: Intermediate sized disc bulge with endplate spurring, unchanged. Moderate spinal canal stenosis. Mild right and severe left neural  foraminal stenosis. L4-L5: Unchanged mild disc bulge with endplate spurring. Narrowing of both lateral recesses without central spinal canal stenosis. Moderate bilateral neural foraminal stenosis. L5-S1: Intermediate sized disc bulge with mild facet hypertrophy. Unchanged moderate spinal canal stenosis. Severe bilateral neural foraminal stenosis. Visualized sacrum: Normal. IMPRESSION: 1. No acute abnormality of the thoracic or lumbar spine. 2. Unchanged moderate spinal canal stenosis at L3-4 and L5-S1. 3. Unchanged severe right L1-2, left L3-4 and bilateral L5-S1 neural foraminal stenosis. 4. Unchanged  moderate bilateral L4-5 neural foraminal stenosis. Electronically Signed   By: Deatra Robinson M.D.   On: 05/24/2023 01:43   MR LUMBAR SPINE WO CONTRAST Result Date: 05/24/2023 CLINICAL DATA:  Infection EXAM: MRI THORACIC AND LUMBAR SPINE WITHOUT CONTRAST TECHNIQUE: Multiplanar and multiecho pulse sequences of the thoracic and lumbar spine were obtained without intravenous contrast. COMPARISON:  01/18/2022 lumbar spine MRI FINDINGS: MRI THORACIC SPINE FINDINGS Alignment:  Physiologic. Vertebrae: No fracture, evidence of discitis, or bone lesion. Cord:  Normal signal and morphology. Paraspinal and other soft tissues: Negative. Disc levels: T2-3: Small disc bulge without spinal canal stenosis. No other spinal canal or neural foraminal stenosis. MRI LUMBAR SPINE FINDINGS Segmentation:  Standard. Alignment:  Grade 1 retrolisthesis at L1-2, L2-3 and L3-4 Vertebrae:  No fracture, evidence of discitis, or bone lesion. Conus medullaris and cauda equina: Conus extends to the L1 level. Conus and cauda equina appear normal. Paraspinal and other soft tissues: Negative. Disc levels: L1-L2: Small disc bulge with endplate spurring. No spinal canal stenosis. Severe right and mild left neural foraminal stenosis. L2-L3: Small disc bulge. No spinal canal stenosis. No neural foraminal stenosis. L3-L4: Intermediate sized disc bulge with  endplate spurring, unchanged. Moderate spinal canal stenosis. Mild right and severe left neural foraminal stenosis. L4-L5: Unchanged mild disc bulge with endplate spurring. Narrowing of both lateral recesses without central spinal canal stenosis. Moderate bilateral neural foraminal stenosis. L5-S1: Intermediate sized disc bulge with mild facet hypertrophy. Unchanged moderate spinal canal stenosis. Severe bilateral neural foraminal stenosis. Visualized sacrum: Normal. IMPRESSION: 1. No acute abnormality of the thoracic or lumbar spine. 2. Unchanged moderate spinal canal stenosis at L3-4 and L5-S1. 3. Unchanged severe right L1-2, left L3-4 and bilateral L5-S1 neural foraminal stenosis. 4. 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 Electronically Signed   By: Deatra Robinson M.D.   On: 05/24/2023 01:43   US RENAL Result Date: 05/23/2023 CLINICAL DATA:  Acute kidney injury. EXAM: RENAL / URINARY TRACT ULTRASOUND COMPLETE COMPARISON:  None Available. FINDINGS: Right Kidney: Renal measurements: 8.2 cm x 5.3 cm x 4.5 cm = volume: 109.4 mL. Echogenicity within normal limits. A 17.7 mm shadowing echogenic renal calculus is seen within the right kidney. No mass or hydronephrosis visualized. Left Kidney: Renal measurements: 11.8 cm x 4.1 cm x 4.7 cm = volume: 119 mL. Echogenicity within normal limits. An 8.9 mm shadowing echogenic renal calculus is seen within the left kidney. No mass or hydronephrosis visualized. Bladder: Echogenic material is seen within the bladder lumen. Other: It should be noted that the study is technically limited secondary to overlying bowel gas, as per the ultrasound technologist. Of incidental note is the presence of an enlarged spleen (17.4 cm in length). IMPRESSION: 1. Bilateral nonobstructing renal calculi. 2. Echogenic material within the urinary bladder which may represent sequelae associated with recent infection. Correlation with urinalysis is recommended. 3. Splenomegaly.  Electronically Signed   By: Aram Candela M.D.   On: 05/23/2023 01:11   CT Renal Stone Study Result Date: 05/22/2023 CLINICAL DATA:  Abdominal/flank pain, stone suspected EXAM: CT ABDOMEN AND PELVIS WITHOUT CONTRAST TECHNIQUE: Multidetector CT imaging of the abdomen and pelvis was performed following the standard protocol without IV contrast. RADIATION DOSE REDUCTION: This exam was performed according to the departmental dose-optimization program which includes automated exposure control, adjustment of the mA and/or kV according to patient size and/or use of iterative reconstruction technique. COMPARISON:  11/20/2017 FINDINGS: Lower chest: Small left pleural effusion. Linear atelectasis in the left lower lobe. Hepatobiliary: Unremarkable  unenhanced appearance of the liver. Clips in the gallbladder fossa postcholecystectomy. No biliary dilatation. Pancreas: No ductal dilatation or inflammation. Spleen: The spleen is enlarged, 15.4 cm cranial caudal. Adrenals/Urinary Tract: No adrenal nodule. There are bilateral nonobstructing intrarenal calculi. No hydronephrosis. Both ureters are decompressed without ureteral stone. The urinary bladder is partially distended, no bladder stone or wall thickening. Stomach/Bowel: Small hiatal hernia. The stomach is nondistended. No small bowel obstruction or inflammatory change. Moderate diffuse colonic diverticulosis. Equivocal faint stranding about a diverticulum involving the distal descending colon series 6, image 81, may represent mild diverticulitis. Moderate volume of stool in the colon. Sigmoid colon is redundant. Vascular/Lymphatic: Aortic atherosclerosis. The aorta is tortuous but nonaneurysmal. No bulky abdominopelvic adenopathy. Reproductive: Enlarged prostate spans 5.9 cm transverse. Other: No ascites. No free air or focal fluid collection small bilateral inguinal hernias contain fat and a small amount of free fluid. Mild body wall edema. Musculoskeletal: The bones  are subjectively under mineralized. Diffuse degenerative change throughout the lumbar spine with multilevel degenerative disc disease and facet hypertrophy. No fracture or acute osseous findings. IMPRESSION: 1. Bilateral nonobstructing intrarenal calculi. No ureteral stone or hydronephrosis. 2. Moderate diffuse colonic diverticulosis. Equivocal faint stranding about a diverticulum involving the distal descending colon, may represent mild diverticulitis. 3. Diffuse degenerative change throughout the spine without acute osseous findings. 4. Small left pleural effusion. 5. Chronic incidental findings include small hiatal hernia. Splenomegaly. Enlarged prostate. 6. Small bilateral inguinal hernias contain fat and a small amount of free fluid. Aortic Atherosclerosis (ICD10-I70.0). Electronically Signed   By: Narda Rutherford M.D.   On: 05/22/2023 18:29    ASSESSMENT & PLAN:   89 year old gentleman with multiple medical issues with  #1 acute on chronic pancytopenia with newly diagnosed acute myeloid leukemia with monocytic differentiation with more than 50% blasts in the peripheral blood. Additional etiologies contributing to his cytopenias include IV antibiotics, recent MSSA bacteremia, COVID-19 infection. His primary oncologist is Dr. Shirline Frees.  #2 hypertension #3 MSSA bacteremia #4 COVID-19 infection #5 pancytopenia with symptomatic anemia and thrombocytopenia and infection issues due to leukopenia. 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 #7 generalized bone pains  Plan -Discussed the new diagnosis of acute myeloid leukemia with monocytic differentiation in details with the patient as well as his wife and daughter at bedside. -We discussed the grave prognosis with this rapidly progressing condition at his age.  Especially since he has developed pretty high blast counts fairly quickly in the peripheral blood. We discussed that this  would affect him both in terms of worsening pancytopenia's as well as risk of blast infiltration into multiple organs.  He is also having generalized bone pains which could be a manifestation of this. -The standard treatment involves 7+3 high-dose induction chemotherapy which she would not be candidate for at his age due to high treatment related mortality and intolerance and in the context of active infections. -In the context of good functional status we sometimes consider hypomethylating agents or hypomethylating agent plus venetoclax or low-dose cytarabine as palliative treatment options. -In his case however with his poor functional status active infections and clear understanding of not wanting to continue multiple visits to the cancer center for transfusion support and cancer treatments... An approach of best supportive care through hospice would be very reasonable. -After an extensive discussion with the the patient his wife and the daughter are all on the same page with choosing to proceed with best supportive cares through hospice. -They would like  to talk to the social worker and palliative care teams about working on the logistics involved. -They would like to return to [facility] if they have hospice support services but if not are open to considering other possibilities. -They would like to continue transfusion support for now pending final logistics of his transition out of the hospital. -I did encourage them to have other family members who would like to meet him do so at their earliest since this could go downhill in the next few days to couple of weeks. -Patient and family are very thankful for all the cares and the honest discussion. -I have informed the hospitalist Dr. Carollee Herter about this discussion and the family's decision making process. -Would recommend consulting palliative care and social worker. -Continue transfusion support up to discharge or as determined by family  members.  .The total time spent in the appointment was 60 minutes* .  All of the patient's questions were answered with apparent satisfaction. The patient knows to call the clinic with any problems, questions or concerns.   Wyvonnia Lora MD MS AAHIVMS [facility] Hematology/Oncology Physician [facility]  .*Total Encounter Time as defined by the Centers for Medicare and Medicaid Services includes, in addition to the face-to-face time of a patient visit (documented in the note above) non-face-to-face time: obtaining and reviewing outside history, ordering and reviewing medications, tests or procedures, care coordination (communications with other health care professionals or caregivers) and documentation in the medical record.  06/10/2023 12:44 PM

## 2023-06-10 NOTE — Progress Notes (Signed)
 OT Cancellation Note  Patient Details Name: SEVRIN SALLY MRN: [id number] DOB: [birth year]   Cancelled Treatment:    Reason Eval/Treat Not Completed: Medical issues which prohibited therapy Patient in room with increased pain per nurse with chart indicating possible GOC discussion. OT to hold and check back for updated orders on 3/30.  Rosalio Loud, MS Acute Rehabilitation Department Office# [phone]  06/10/2023, 11:20 AM

## 2023-06-10 NOTE — Consult Note (Signed)
 Consultation Note Date: 06/10/2023   Patient Name: Samuel Little  DOB: 04-21-1932  MRN: [id number]  Age / Sex: 89 y.o., male   PCP: Georgann Housekeeper, MD Referring Physician: Carollee Herter, DO  Reason for Consultation: Establishing goals of care     Chief Complaint/History of Present Illness:   Patient is a 89 year old male with past medical history of hypertension, hypothyroidism, AVNRT status post ablation, and chronic low back pain with no neurogenic claudication who was admitted on 06/08/2023 from rehab after PICC line noted to be bleeding at insertion site.  Patient had recently been admitted to [facility] and had been discharged on 05/29/2023 after being found to have MSSA bacteremia and was then discharged to rehab.  Since admission, patient has continued receiving management for MSSA bacteremia and bleeding.  Patient noted to have pancytopenia which was followed by oncology.  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  Patient is not a candidate for leukemia directed therapies due to age and medical conditions.  Palliative medicine team consulted to assist with complex medical decision making.  Extensive review of EMR prior to presenting to bedside including review of oncology's notes from today.  Also discussed care with oncologist to inquire about prognosis.  Patient has a "grave" prognosis and likely has days to maybe 2 weeks.  Oncology met with family today and they have decided to pursue hospice support out of the hospital.  Presented to bedside to speak with patient.  Patient's wife and daughter at bedside so able to engage in conversation with them as well.  Patient is incredibly hard of hearing so able to use pocket talker attached to patient's hearing aid.  Able to inquire about patient's symptom management at that time.  Patient notes that the pain medications do  assist with his pain though based on discussion with daughter, these medications only last maybe 3 hours.  Patient is continuing to have pain at this time.  Discussed would adjust oxycodone dosing to allow higher dose as well as adding IV medication for rapid relief of pain.  Patient voiced appreciation for this. Patient's only other symptom concern at this time was constipation.  Discussed adjustment of medications for senna and MiraLAX.  Patient agreement with this plan.  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 Individuals present for the conversation: Inquired with patient if he wanted to discuss hospice care moving forward or if he would like this to be discussed with his wife and daughter present at bedside separately.  Patient has already acknowledged he is at the end of life and noted he defer decisions regarding hospice to his wife and daughter.  Respected patient's wishes regarding this.  Patient's wife and daughter participating conversation as detailed below with this palliative provider as per patient's request.  Summary of the conversation:   Able to speak with patient's wife and daughter separately in private area to discuss patient's hospice support moving forward.  Spent time learning about patient's life prior to hospitalization.  Family discussed rapid deterioration.  Patient had been brought in from rehab.  Patient is not a long-term care resident at [facility].  Patient had  been at home prior to original hospitalization at [facility].  With permission discussed hospice  philosophy and focusing on symptom management at the end of life.  Discussed home hospice with care support (out-of-pocket cost for hired caregivers), long-term care with hospice, and inpatient hospice.  Spent time explaining generalities of what these different aspects of hospice support would incorporate.  Also discussed qualifications for hospice support in home versus long-term care versus inpatient hospice criteria.  Spent time discussing care at this time while patient in the hospital.  Family is currently working to call other family about visiting for end-of-life if desired/able to.  Noted patient cannot continue to receive transfusions on hospice.  At this time decision made patient will have recheck of lab work on 06/11/2023 and transfusion x 1 if needed based on that lab work.  After this, patient will not receive any further transfusions.  Patient will be transition to full comfort focused care at that time.  This 1 transfusion if needed is to allow time for family to visit.  Family agreeing with this plan.   Inquired about request for hospice support and allowing choice.  In terms of inpatient hospice, family specifically requesting that patient remain here in [place].  We discussed referral potentially tomorrow for [facility] evaluation once patient is full comfort focused care.  Family agreeing with this plan. Outcome of the conversations and/or documents completed:   I spent 30 minutes providing separately identifiable ACP services with the patient and/or surrogate decision maker in a voluntary, in-person conversation discussing the patient's wishes and goals as detailed in the above note.  Alvester Morin, DO Palliative Care Provider  -------------------------------------------------------------------------------------------------------------  Spent time providing emotional support via active listening.  All questions answered at that time.  Noted palliative medicine team to continue to  following with patient's medical journey.  Discussed care with IDT including hospitalist, RN, [facility] liaison, and oncology.  Primary Diagnoses  Present on Admission:  Hypothyroidism  Essential hypertension  Gout  Thrombocytopenia (HCC)  Spinal stenosis of lumbar region with neurogenic claudication  Macular degeneration  Other pancytopenia (HCC)   Palliative Review of Systems: pain  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 , march 2,2011    Arthritis    Diverticulosis    Effusion, pericardium    Gastroesophageal reflux disease    Gout    Hard of hearing    wears bilateral hearing aids   History of kidney stones    Hypertension    Hypothyroidism    Macular degeneration    Pleural effusion, left    Right bundle branch block    Thyroid disease    hypothyroidism   Wears glasses    Social History   Socioeconomic History   Marital status: Married    Spouse name: Not on file   Number of children: Not on file   Years of education: Not on file   Highest education level: Not on file  Occupational History   Not on file  Tobacco Use   Smoking status: Never   Smokeless tobacco: Never  Substance and Sexual Activity   Alcohol use: No   Drug use: No   Sexual activity: Not on file  Other Topics Concern   Not on file  Social History Narrative   Not on file   Social Drivers of Health   Financial Resource Strain: Not on file  Food Insecurity: No Food  Insecurity (06/09/2023)   Hunger Vital Sign    Worried About Running Out of Food in the Last Year: Never true    Ran Out of Food in the Last Year: Never true  Transportation Needs: No Transportation Needs (06/09/2023)   PRAPARE - Administrator, Civil Service (Medical): No    Lack of Transportation (Non-Medical): No  Physical Activity: Not on file  Stress: Not on file  Social Connections: Moderately  Integrated (06/09/2023)   Social Connection and Isolation Panel [NHANES]    Frequency of Communication with Friends and Family: More than three times a week    Frequency of Social Gatherings with Friends and Family: More than three times a week    Attends Religious Services: More than 4 times per year    Active Member of Clubs or Organizations: No    Attends Banker Meetings: Never    Marital Status: Married   Family History  Problem Relation Age of Onset   Hypertension Mother    Diabetes Mother    Hypertension Father    Heart attack Father    Diabetes Father    Heart attack Brother    Neuropathy Brother    Heart attack Paternal Uncle    Heart attack Paternal Uncle    Heart attack Paternal Uncle    Other Son        Leak in aorta   Scheduled Meds:  Chlorhexidine Gluconate Cloth  6 each Topical Daily   docusate sodium  100 mg Oral BID   doxazosin  4 mg Oral QHS   feeding supplement  237 mL Oral BID BM   gabapentin  100 mg Oral QHS   levothyroxine  150 mcg Oral Q0600   lidocaine  1-3 patch Transdermal Q24H   methocarbamol  500 mg Oral QID   multivitamin with minerals  1 tablet Oral Daily   predniSONE  20 mg Oral Q breakfast   Continuous Infusions:   ceFAZolin (ANCEF) IV 2 g (06/10/23 1047)   PRN Meds:.naphazoline-glycerin, oxyCODONE, sodium chloride flush Allergies  Allergen Reactions   Nsaids Other (See Comments)    JAUNDICE   Celecoxib Nausea And Vomiting   Meperidine Hcl Nausea Only   CBC:    Component Value Date/Time   WBC 3.0 (L) 06/10/2023 0421   HGB 7.7 (L) 06/10/2023 0421   HGB 7.0 (L) 06/07/2023 1119   HCT 23.7 (L) 06/10/2023 0421   PLT 22 (LL) 06/10/2023 0421   PLT 20 (L) 06/07/2023 1119   MCV 103.5 (H) 06/10/2023 0421   NEUTROABS 0.6 (L) 06/10/2023 0421   LYMPHSABS 1.0 06/10/2023 0421   MONOABS 1.0 06/10/2023 0421   EOSABS 0.0 06/10/2023 0421   BASOSABS 0.0 06/10/2023 0421   Comprehensive Metabolic Panel:    Component Value  Date/Time   NA 134 (L) 06/10/2023 0421   K 3.8 06/10/2023 0421   CL 103 06/10/2023 0421   CO2 24 06/10/2023 0421   BUN 58 (H) 06/10/2023 0421   CREATININE 1.06 06/10/2023 0421   CREATININE 0.92 06/07/2023 1119   GLUCOSE 82 06/10/2023 0421   CALCIUM 8.3 (L) 06/10/2023 0421   AST 39 06/10/2023 0421   AST 39 06/07/2023 1119   ALT 9 06/10/2023 0421   ALT 6 06/07/2023 1119   ALKPHOS 174 (H) 06/10/2023 0421   BILITOT 1.3 (H) 06/10/2023 0421   BILITOT 1.3 (H) 06/07/2023 1119   PROT 5.6 (L) 06/10/2023 0421   ALBUMIN 1.8 (L) 06/10/2023 0421    Physical  Exam: Vital Signs: BP (!) 131/52 (BP Location: Right Arm)   Pulse 63   Temp 98 F (36.7 C) (Oral)   Resp 16   Ht 6' (1.829 m)   Wt 104 kg   SpO2 98%   BMI 31.09 kg/m  SpO2: SpO2: 98 % O2 Device: O2 Device: Room Air O2 Flow Rate:   Intake/output summary:  Intake/Output Summary (Last 24 hours) at 06/10/2023 1213 Last data filed at 06/09/2023 1800 Gross per 24 hour  Intake 495.89 ml  Output 1250 ml  Net -754.11 ml   LBM: Last BM Date : 06/10/23 Baseline Weight: Weight: 83.9 kg Most recent weight: Weight: 104 kg  General: Awake, ill-appearing, hard of hearing Cardiovascular: RRR, edema in all extremities Respiratory: no increased work of breathing noted, not in respiratory distress Neuro: Awake, appropriately interactive Psych: appropriately answers all questions          Palliative Performance Scale: 20%               Additional Data Reviewed: Recent Labs    06/09/23 0033 06/09/23 0905 06/10/23 0421  WBC 2.6* 3.5* 3.0*  HGB 6.3* 8.5* 7.7*  PLT 19* 17* 22*  NA 133*  --  134*  BUN 60*  --  58*  CREATININE 1.09  --  1.06    Imaging: VAS [place] LOWER EXTREMITY VENOUS (DVT)  Lower Venous DVT Study  Patient Name:  Samuel Little  Date of Exam:   06/09/2023 Medical Rec #: [id number]       Accession #:    [id number] Date of Birth: [birth year]       Patient Gender: M Patient Age:   23 years Exam [facility]  [facility] Procedure:      VAS [place] LOWER EXTREMITY VENOUS (DVT) Referring Phys: Midge Minium  --------------------------------------------------------------------------------   Indications: Edema.   Risk Factors: None identified. Limitations: Poor ultrasound/tissue interface and patient positioning. Comparison Study: No prior studies.  Performing Technologist: Chanda Busing RVT    Examination Guidelines: A complete evaluation includes B-mode imaging, spectral Doppler, color Doppler, and power Doppler as needed of all accessible portions of each vessel. Bilateral testing is considered an integral part of a complete examination. Limited examinations for reoccurring indications may be performed as noted. The reflux portion of the exam is performed with the patient in reverse Trendelenburg.     +---------+---------------+---------+-----------+----------+-------------------+ RIGHT    CompressibilityPhasicitySpontaneityPropertiesThrombus Aging      +---------+---------------+---------+-----------+----------+-------------------+ CFV      Full           Yes      Yes                                      +---------+---------------+---------+-----------+----------+-------------------+ SFJ      Full                                                             +---------+---------------+---------+-----------+----------+-------------------+ FV Prox  Full                                                             +---------+---------------+---------+-----------+----------+-------------------+  FV Mid                  Yes      Yes                                      +---------+---------------+---------+-----------+----------+-------------------+ FV Distal               Yes      Yes                                      +---------+---------------+---------+-----------+----------+-------------------+ PFV      Full                                                              +---------+---------------+---------+-----------+----------+-------------------+ POP      Full           Yes      Yes                                      +---------+---------------+---------+-----------+----------+-------------------+ PTV      Full                                                             +---------+---------------+---------+-----------+----------+-------------------+ PERO                                                  Not well visualized +---------+---------------+---------+-----------+----------+-------------------+        +---------+---------------+---------+-----------+----------+--------------+ LEFT     CompressibilityPhasicitySpontaneityPropertiesThrombus Aging +---------+---------------+---------+-----------+----------+--------------+ CFV      Full           Yes      Yes                                 +---------+---------------+---------+-----------+----------+--------------+ SFJ      Full                                                        +---------+---------------+---------+-----------+----------+--------------+ FV Prox  Full                                                        +---------+---------------+---------+-----------+----------+--------------+ FV Mid                  Yes      Yes                                 +---------+---------------+---------+-----------+----------+--------------+  FV Distal               Yes      Yes                                 +---------+---------------+---------+-----------+----------+--------------+ PFV      Full                                                        +---------+---------------+---------+-----------+----------+--------------+ POP      Full           Yes      Yes                                 +---------+---------------+---------+-----------+----------+--------------+ PTV      Full                                                         +---------+---------------+---------+-----------+----------+--------------+ PERO     Full                                                        +---------+---------------+---------+-----------+----------+--------------+             Summary: RIGHT:  - There is no evidence of deep vein thrombosis in the lower extremity. However, portions of this examination were limited- see technologist comments above.   - No cystic structure found in the popliteal fossa.   LEFT:  - There is no evidence of deep vein thrombosis in the lower extremity. However, portions of this examination were limited- see technologist comments above.   - No cystic structure found in the popliteal fossa.    *See table(s) above for measurements and observations.  Electronically signed by Carolynn Sayers on 06/09/2023 at 12:48:57 PM.      Final      I personally reviewed recent imaging.   Palliative Care Assessment and Plan Summary of Established Goals of Care and Medical Treatment Preferences   Patient is a 89 year old male with past medical history of hypertension, hypothyroidism, AVNRT status post ablation, and chronic low back pain with no neurogenic claudication who was admitted on 06/08/2023 from rehab after PICC line noted to be bleeding at insertion site.  Patient had recently been admitted to [facility] and had been discharged on 05/29/2023 after being found to have MSSA bacteremia and was then discharged to rehab.  Since admission, patient has continued receiving management for MSSA bacteremia and bleeding.  Patient noted to have pancytopenia which was followed by oncology.  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  Patient is not a candidate for leukemia directed therapies due to age and medical conditions.  Palliative  medicine team consulted to assist with complex medical decision making.  # Complex  medical decision making/goals of care  -Conversation held with the patient as detailed above in HPI.  Patient deferred further planning regarding hospice and end-of-life care to his wife and daughter.  Spoke separately with patient's wife and daughter as also detailed above in HPI.  Will plan to recheck lab work 1 more time on 06/11/2023 in AM and provide transfusion x1 if needed.  This is to allow family more time to get to bedside to visit at end-of-life.  After checking this lab work and transfusing if needed, we will then transition to full comfort focused care including discontinuation of further lab work at that point, not to receive further transfusions, and antibiotics to be discontinued.  Once patient comfort focused care, family requesting referral for evaluation to transfer to [facility] for end-of-life care.  -  Code Status: Limited: Do not attempt resuscitation (DNR) -DNR-LIMITED -Do Not Intubate/DNI   Prognosis: < 2 weeks  # Symptom management Patient is receiving these palliative interventions for symptom management with an intent to improve quality of life.   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  Discussed with family if symptoms worsening and patient needing frequent medications, may need to consider continuous infusion.   -Change oxycodone to 5-10 mg every 3 hours as needed   -Constipation   -Start senna 2 tab twice daily   -Start MiraLAX 17 g daily  # Psycho-social/Spiritual Support:  - Support System: Wife, daughter, other children and grandchildren  # Discharge Planning: Patient to be transition to full comfort care tomorrow after receiving 1 more lab work checked to determine if transfusion  needed.  Goal of this is to allow time for family to come to bedside to visit with patient at end-of-life.  Once transition to full comfort focused care, family has specifically requested referral for evaluation of admission to [facility].  Thank you for allowing the palliative care team to participate in the care Samuel Little.  Alvester Morin, DO Palliative Care Provider PMT # [phone]  If patient remains symptomatic despite maximum doses, please call PMT at [phone] between 0700 and 1900. Outside of these hours, please call attending, as PMT does not have night coverage.

## 2023-06-10 NOTE — Care Management Obs Status (Signed)
 MEDICARE OBSERVATION STATUS NOTIFICATION   Patient Details  Name: Samuel Little MRN: [id number] Date of Birth: [birth year]   Medicare Observation Status Notification Given:  Yes    Howell Rucks, RN 06/10/2023, 2:59 PM

## 2023-06-10 NOTE — Assessment & Plan Note (Signed)
 06-10-2023 note from oncology reviewed yesterday along with discussion with Dr. Candise Che with oncology today and with Dr. Patterson Hammersmith with palliative care.  Patient has AML.  There are no treatment options for him.  Family is electing to proceed with comfort care measures.  They are notifying family.  Hospice has been notified.  They will see him tomorrow.  IV morphine has been started.  06-11-2023 prognosis is terminal. I do not see the need to keep checking labs or giving PRBC/platelet transfusions. This would be futile care. Transfusion of PRBC or platelet will not change his outcome. Continue IV morphine for pain control.  *update. Pt seen by hospice. Pt accepted to [facility] today.

## 2023-06-10 NOTE — Progress Notes (Signed)
 PROGRESS NOTE    Samuel Little  UJW:[id number] DOB: 02-Jan-1933 DOA: 06/08/2023 PCP: Georgann Housekeeper, MD  Subjective: Pt seen and examined. Met with pt's wife and dtr Bonita Quin at bedside.  Notes from multiple consultants including oncology and palliative care reviewed.  Family is decided proceed with comfort care measures.  Hospice will be consulted tomorrow.  IV morphine has been started.  Patient is resting comfortably.  I did not wake him.   Hospital Course: HPI:  Samuel Little is a 89 y.o. 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  Patient had dislodged [name] of the right upper extremity  2 days ago and a new one was placed on the left upper extremity.  Earlier yesterday patient was noticed to increasing bleeding from the PICC line insertion site and was referred to the ER.  No bleeding from other sites.  No bleeding from the mouth or urine.  2 days ago patient had followed up with Dr. Arbutus Ped oncologist and had received platelet transfusion for low platelets.  Patient otherwise denies any chest pain shortness of breath nausea vomiting diarrhea and is able to ambulate with help of physical therapy.   ED Course: On exam patient has significant peripheral edema.  In the ER after placing pressure the bleeding was able to be stopped.  Labs show hemoglobin of 6.2 which is decreased from 7 two days ago and platelets of 18.  2 units of PRBC transfusion was ordered.  CT chest shows mild bilateral pleural effusion and also signs of cirrhosis of the liver with splenomegaly.  INR is 1.3.  Significant Events: Admitted  06/08/2023 acute blood loss anemia   Significant Labs: Covid POSITIVE Na 133, K 4.4, CO2 of 21, BUN 58, Scr 1.05, glu 141 WBC 2.4, Hgb 6.2, plt 18  Significant Imaging Studies: CXR Small left pleural effusion with atelectasis or infiltrate at the left lung base.  left-sided PICC line terminates over the superior vena cava.  CT chest Small bilateral pleural effusions, decreased since prior CT. 2. Linear atelectasis in the left lung base. 3. Interstitial pattern to the lung bases likely represents mild edema. 4. Cardiac enlargement. 5. Aortic atherosclerosis.  6. Hepatic cirrhosis with splenic enlargement and upper abdominal ascites. 7. Nonobstructing stones in the kidneys  Antibiotic Therapy: Anti-infectives (From admission, onward)    Start     Dose/Rate Route Frequency Ordered Stop   06/09/23 0800  ceFAZolin (ANCEF) IVPB 2g/100 mL premix        2 g 200 mL/hr over 30 Minutes Intravenous Every 8 hours 06/09/23 0027     06/09/23 0115  ceFAZolin (ANCEF) IVPB 2g/100 mL premix        2 g 200 mL/hr over 30 Minutes Intravenous NOW 06/09/23 0027 06/09/23 0146   06/09/23 0015  ceFAZolin (ANCEF) IVPB  Status:  Discontinued       Note to Pharmacy: Indication:  MSSA Bacteremia First Dose: Yes Last Day of Therapy:  07/06/23 Labs - Once weekly:  CBC/D and BMP, Labs - Once weekly: ESR and CRP     2 g Intravenous Every 8 hours 06/09/23 0011 06/09/23 0027  Procedures:   Consultants: oncology    Assessment and Plan: * AML (acute myeloid leukemia) (HCC) 06-10-2023 note from oncology reviewed yesterday along with discussion with Dr. Candise Che with oncology today and with Dr. Patterson Hammersmith with palliative care.  Patient has AML.  There are no treatment options for him.  Family is electing to proceed with comfort care measures.  They are notifying family.  Hospice has been notified.  They will see him tomorrow.  IV morphine has been started.  Bleeding from PICC line, initial encounter (HCC) 06-09-2023  stopped with pressure dressing  Acute blood loss anemia 06-09-2023 from bleeding PICC site. S/p 2 units PRBC transfusion. Initial HgB of 6.2 g/dl. Post-transfusion HgB of 8.5 g/dl.  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  Prognosis is terminal. Family wants pt to continue to receive PRBC and platelet transfusion during the rest of his hospital stay. This was discussed between family and oncology.  Bilateral leg edema 06-09-2023 continue with IV lasix to help with edema. Pt's low serum albumin is likely contributing to his edema.  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  Prognosis is terminal.  Stop IV lasix so the patient will not have the urge to urinate so frequently.  MSSA bacteremia 06-09-2023 continue with IV Ancef as already scheduled when he was discharged from the hospital on 05-29-2023.  Cefazolin 2g IV q8h through 07/06/23   06-10-2023 stop IV abx. patient has AML.  Prognosis is terminal.   Thrombocytopenia (HCC) 06-09-2023 oncology consulted. Pt has been dealing with his low platelet count for about 8 months now. Etiology unknown.  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  Prognosis is terminal.   Other pancytopenia (HCC) 06-09-2023 oncology consulted. Pt has been dealing with his low platelet count for about 8 months now. Etiology unknown. 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  Prognosis is terminal.  Pressure injury of skin Present on admission.  Pressure Injury 06/09/23 Buttocks Right;Left;Mid Stage 2 -  Partial thickness loss of dermis presenting as a shallow open injury with a red, pink wound bed without slough. (Active)  06/09/23 0137  Location: Buttocks  Location Orientation: Right;Left;Mid  Staging: Stage 2 -  Partial thickness loss of dermis presenting as a shallow open injury with a red, pink wound bed without slough.  Wound Description (Comments):   Present  on Admission: Yes      COVID-19 virus infection 06-09-2023 on RA. Pt is immunosuppressed.  Macular degeneration 06-09-2023 chronic. Stable.  Spinal stenosis of lumbar region with neurogenic claudication 06-09-2023 chronic. Stable. Continue as needed oxycodone.  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  Prognosis is terminal. Pt now on IV morphine prn.  Gout 06-09-2023 chronic. Stable.  Essential hypertension 06-09-2023 hold ACEI while diuresing. Continue with IV lasix 20 mg bid.  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  Prognosis is terminal. Stop IV lasix  Hypothyroidism 06-09-2023 TSH 7.8. will check FT4.  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  Prognosis is terminal.    DVT prophylaxis: SCDs Start: 06/09/23 0010   Code Status: Limited: Do not attempt resuscitation (DNR) -DNR-LIMITED -Do Not Intubate/DNI  Family Communication: discussed with pt's wife and dtr at bedside Disposition Plan: hospice vs SNF Reason for continuing need for hospitalization: on IV morphine. Awaiting assessment by hospice.  Objective: Vitals:   06/09/23 1600 06/09/23 2028 06/10/23 0519 06/10/23 1351  BP: 136/86 (!) 131/52 (!) 148/67 (!) 152/62  Pulse: 66 63 (!) 107 63  Resp: 18 16 16 16   Temp: 98.1 F (36.7 C) 98 F (36.7 C) 99 F (37.2 C) 97.6 F (36.4 C)  TempSrc: Oral Oral  Oral  SpO2: 99% 98% 98% 96%  Weight:      Height:        Intake/Output Summary (Last 24 hours) at 06/10/2023 1607 Last data filed at 06/10/2023 1415 Gross per 24 hour  Intake 520.06 ml  Output 1450 ml  Net -929.94 ml   Filed Weights   06/08/23 1829 06/09/23 0127  Weight: 83.9 kg 104 kg   Examination:  Physical Exam Vitals and nursing note reviewed.  Constitutional:      Comments: Sleeping peacefully. On RA. No distress  Pulmonary:     Effort: No respiratory distress.  Musculoskeletal:     Comments: +2 pitting UE edema    Data Reviewed: I have personally reviewed following labs and imaging studies  CBC: Recent Labs  Lab  06/07/23 1119 06/08/23 2019 06/09/23 0033 06/09/23 0905 06/10/23 0421  WBC 5.2 2.4* 2.6* 3.5* 3.0*  NEUTROABS 0.7* 0.8*  --  0.5* 0.6*  HGB 7.0* 6.2* 6.3* 8.5* 7.7*  HCT 20.9* 19.4* 19.9* 25.8* 23.7*  MCV 103.5* 108.4* 108.7* 102.8* 103.5*  PLT 20* 18* 19* 17* 22*   Basic Metabolic Panel: Recent Labs  Lab 06/07/23 1119 06/08/23 2019 06/09/23 0033 06/10/23 0421  NA 134* 133* 133* 134*  K 4.1 4.4 4.5 3.8  CL 105 106 105 103  CO2 24 21* 23 24  GLUCOSE 103* 141* 139* 82  BUN 43* 58* 60* 58*  CREATININE 0.92 1.05 1.09 1.06  CALCIUM 8.4* 8.3* 8.2* 8.3*  MG  --   --   --  1.8   GFR: Estimated Creatinine Clearance: 58.9 mL/min (by C-G formula based on SCr of 1.06 mg/dL). Liver Function Tests: Recent Labs  Lab 06/07/23 1119 06/08/23 2019 06/09/23 0033 06/10/23 0421  AST 39 45* 43* 39  ALT 6 10 10 9   ALKPHOS 229* 189* 196* 174*  BILITOT 1.3* 1.0 1.2 1.3*  PROT 6.3* 6.0* 5.7* 5.6*  ALBUMIN 2.2* 1.9* 1.8* 1.8*   Coagulation Profile: Recent Labs  Lab 06/09/23 0033  INR 1.3*   BNP (last 3 results) Recent Labs    09/09/22 0830  BNP 221.0*   Thyroid Function Tests: Recent Labs    06/09/23 0905 06/10/23 0421  TSH 7.820*  --   FREET4  --  0.77   Recent Results (from the past 240 hours)  Resp panel by RT-PCR (RSV, Flu A&B, Covid) Anterior Nasal Swab     Status: Abnormal   Collection Time: 06/08/23  7:01 PM   Specimen: Anterior Nasal Swab  Result Value Ref Range Status   SARS Coronavirus 2 by RT PCR POSITIVE (A) NEGATIVE Final    Comment: (NOTE) SARS-CoV-2 target nucleic acids are DETECTED.  The SARS-CoV-2 RNA is generally detectable in upper respiratory specimens during the acute phase of infection. Positive results are indicative of the presence of the identified virus, but do not rule out bacterial infection or co-infection with other pathogens not detected by the test. Clinical correlation with patient history and other diagnostic information is  necessary to determine patient infection status. The expected result is Negative.  Fact Sheet for Patients: BloggerCourse.com  Fact Sheet for Healthcare Providers: SeriousBroker.it  This test is not yet approved or cleared by the [place] FDA and  has been authorized for detection and/or diagnosis of SARS-CoV-2 by FDA under an Emergency Use Authorization (EUA).  This EUA will remain in effect (meaning this test can be used) for the duration of  the COVID-19 declaration under Section 564(b)(1) of the A ct, 21 U.S.C. section 360bbb-3(b)(1), unless the authorization is terminated or revoked sooner.     Influenza A by PCR NEGATIVE NEGATIVE Final   Influenza B by PCR NEGATIVE NEGATIVE Final    Comment: (NOTE) The Xpert Xpress SARS-CoV-2/FLU/RSV plus assay is intended as an aid in the diagnosis of influenza from Nasopharyngeal swab specimens and should not be used as a sole basis for treatment. Nasal washings and aspirates are unacceptable for Xpert Xpress SARS-CoV-2/FLU/RSV testing.  Fact Sheet for Patients: BloggerCourse.com  Fact Sheet for Healthcare Providers: SeriousBroker.it  This test is not yet approved or cleared by the [place] FDA and has been authorized for detection and/or diagnosis of SARS-CoV-2 by FDA under an Emergency Use Authorization (EUA). This EUA will remain in effect (meaning this test can be used) for the duration of the COVID-19 declaration under Section 564(b)(1) of the Act, 21 U.S.C. section 360bbb-3(b)(1), unless the authorization is terminated or revoked.     Resp Syncytial Virus by PCR NEGATIVE NEGATIVE Final    Comment: (NOTE) Fact Sheet for Patients: BloggerCourse.com  Fact Sheet for Healthcare Providers: SeriousBroker.it  This test is not yet approved or cleared by the [place] FDA  and has been authorized for detection and/or diagnosis of SARS-CoV-2 by FDA under an Emergency Use Authorization (EUA). This EUA will remain in effect (meaning this test can be used) for the duration of the COVID-19 declaration under Section 564(b)(1) of the Act, 21 U.S.C. section 360bbb-3(b)(1), unless the authorization is terminated or revoked.  Performed at [facility], [address] [place], [place] [address]      Surgical Pathology CASE: [id number] PATIENT: Freddi Starr Flow Pathology Report Clinical history: Pancytopenia DIAGNOSIS:  Peripheral blood, flow cytometry: -  Acute myeloid leukemia with monocytic differentiation.  Note: The peripheral blood is pancytopenic and shows abundant immature precursors that are present in the dim CD45 blast Cates and coexpress CD11b, CD14, CD33, CD38, CD64 and HLA they are at approximately 50% overall.  This is in the background of limited neutrophilic maturation with evidence of dysplasia.  In addition there are numerous nucleated red blood cells.  The overall morphologic and immunophenotypic findings are consistent with an acute myeloid leukemia with monocytic differentiation.  Preliminary results were discussed with Dr. Shirline Frees.  GATING AND PHENOTYPIC ANALYSIS:  Gated population: Flow cytometric immunophenotyping is performed using antibodies to the antigens listed in the table below. Electronic gates are placed around a cell cluster displaying light scatter properties corresponding to: monocytes  Abnormal Cells in gated population: 49%  Phenotype of Abnormal Cells: CD4, CD11b, CD14, CD33, CD38, CD64, HLA-Dr                      Lymphoid Antigens       Myeloid Antigens Miscellaneous CD2  NEG  CD10 NEG  CD11b     POS  CD45 POS CD3  NEG  CD19 NEG  CD11c     ND   HLA-Dr    POS CD4  POS  CD20 NEG  CD13 NEG  CD34 NEG CD5  NEG  CD22 ND   CD14 POS  CD38 POS CD7  NEG  CD79b     ND   CD15 NEG  CD138      ND CD8  NEG  CD103     ND  CD16 NEG  TdT  ND CD25 ND   CD200     POS  CD33 POS  CD123     NEG TCRab     ND   sKappa    NEG  CD64 POS  CD41 ND TCRgd     NEG  sLambda   NEG  CD117     NEG  CD61 ND CD56 NEG  cKappa    ND   MPO  NEG  CD71 ND CD57 ND   cLambda   ND        [id number]   GROSS DESCRIPTION:  One lavender top tube submitted from [facility] for leukemia testing.   Radiology Studies: VAS [place] LOWER EXTREMITY VENOUS (DVT) Result Date: 06/09/2023  Lower Venous DVT Study Patient Name:  JONTRELL BUSHONG  Date of Exam:   06/09/2023 Medical Rec #: [id number]       Accession #:    [id number] Date of Birth: [birth year]       Patient Gender: M Patient Age:   21 years Exam Location:  [facility] Procedure:      VAS [place] LOWER EXTREMITY VENOUS (DVT) Referring Phys: Midge Minium --------------------------------------------------------------------------------  Indications: Edema.  Risk Factors: None identified. Limitations: Poor ultrasound/tissue interface and patient positioning. Comparison Study: No prior studies. Performing Technologist: Chanda Busing RVT  Examination Guidelines: A complete evaluation includes B-mode imaging, spectral Doppler, color Doppler, and power Doppler as needed of all accessible portions of each vessel. Bilateral testing is considered an integral part of a complete examination. Limited examinations for reoccurring indications may be performed as noted. The reflux portion of the exam is performed with the patient in reverse Trendelenburg.  +---------+---------------+---------+-----------+----------+-------------------+ RIGHT    CompressibilityPhasicitySpontaneityPropertiesThrombus Aging      +---------+---------------+---------+-----------+----------+-------------------+ CFV      Full           Yes      Yes                                      +---------+---------------+---------+-----------+----------+-------------------+ SFJ      Full                                                              +---------+---------------+---------+-----------+----------+-------------------+ FV Prox  Full                                                             +---------+---------------+---------+-----------+----------+-------------------+ FV Mid                  Yes      Yes                                      +---------+---------------+---------+-----------+----------+-------------------+ FV Distal               Yes      Yes                                      +---------+---------------+---------+-----------+----------+-------------------+  PFV      Full                                                             +---------+---------------+---------+-----------+----------+-------------------+ POP      Full           Yes      Yes                                      +---------+---------------+---------+-----------+----------+-------------------+ PTV      Full                                                             +---------+---------------+---------+-----------+----------+-------------------+ PERO                                                  Not well visualized +---------+---------------+---------+-----------+----------+-------------------+   +---------+---------------+---------+-----------+----------+--------------+ LEFT     CompressibilityPhasicitySpontaneityPropertiesThrombus Aging +---------+---------------+---------+-----------+----------+--------------+ CFV      Full           Yes      Yes                                 +---------+---------------+---------+-----------+----------+--------------+ SFJ      Full                                                        +---------+---------------+---------+-----------+----------+--------------+ FV Prox  Full                                                         +---------+---------------+---------+-----------+----------+--------------+ FV Mid                  Yes      Yes                                 +---------+---------------+---------+-----------+----------+--------------+ FV Distal               Yes      Yes                                 +---------+---------------+---------+-----------+----------+--------------+ PFV      Full                                                        +---------+---------------+---------+-----------+----------+--------------+  POP      Full           Yes      Yes                                 +---------+---------------+---------+-----------+----------+--------------+ PTV      Full                                                        +---------+---------------+---------+-----------+----------+--------------+ PERO     Full                                                        +---------+---------------+---------+-----------+----------+--------------+     Summary: RIGHT: - There is no evidence of deep vein thrombosis in the lower extremity. However, portions of this examination were limited- see technologist comments above.  - No cystic structure found in the popliteal fossa.  LEFT: - There is no evidence of deep vein thrombosis in the lower extremity. However, portions of this examination were limited- see technologist comments above.  - No cystic structure found in the popliteal fossa.  *See table(s) above for measurements and observations. Electronically signed by Carolynn Sayers on 06/09/2023 at 12:48:57 PM.    Final    CT Chest W Contrast Result Date: 06/08/2023 CLINICAL DATA:  Pneumonia with complication suspected. X-ray done. Respiratory illness with nondiagnostic x-ray. Possible COVID. Bilateral pitting edema. EXAM: CT CHEST WITH CONTRAST TECHNIQUE: Multidetector CT imaging of the chest was performed during intravenous contrast administration. RADIATION DOSE REDUCTION: This exam was  performed according to the departmental dose-optimization program which includes automated exposure control, adjustment of the mA and/or kV according to patient size and/or use of iterative reconstruction technique. CONTRAST:  75mL OMNIPAQUE IOHEXOL 300 MG/ML  SOLN COMPARISON:  Chest radiograph 06/08/2023.  CT chest 09/09/2022 FINDINGS: Cardiovascular: Cardiac enlargement. No pericardial effusions. Normal caliber thoracic aorta. Calcification of the aorta and coronary arteries. Mediastinum/Nodes: Esophagus is decompressed. Mediastinal lymph nodes are not pathologically enlarged. Lungs/Pleura: Small bilateral pleural effusions are decreased in size since prior CT. Interstitial changes in the lung bases likely representing edema. Linear opacities in the left base are likely linear atelectasis. No pneumothorax. Upper Abdomen: Small amount of upper abdominal ascites. Several stones demonstrated in both kidneys. Largest visualized on the right is 1.4 cm in diameter. No visualized hydronephrosis although kidneys are not entirely included within the field of view. Hepatic cirrhosis with enlarged lateral segment left lobe and caudate lobe of the liver. Nodular contour to the liver. Spleen is enlarged. Gallbladder is surgically absent. Musculoskeletal: Degenerative changes in the spine. No acute bony abnormalities. IMPRESSION: 1. Small bilateral pleural effusions, decreased since prior CT. 2. Linear atelectasis in the left lung base. 3. Interstitial pattern to the lung bases likely represents mild edema. 4. Cardiac enlargement. 5. Aortic atherosclerosis. 6. Hepatic cirrhosis with splenic enlargement and upper abdominal ascites. 7. Nonobstructing stones in the kidneys. Electronically Signed   By: Burman Nieves M.D.   On: 06/08/2023 22:28   DG Chest Port 1 View Result Date: 06/08/2023 CLINICAL DATA:  Cough, bleeding from PICC, possible COVID.  Bilateral pitting edema in legs. EXAM: PORTABLE CHEST 1 VIEW COMPARISON:   10/27/2022. FINDINGS: Heart is enlarged and the mediastinal contour is within normal limits. There is atherosclerotic calcification of the aorta. There is mild airspace disease at the left lung base with a chronic left pleural effusion. The right lung is clear. A stable opacity is noted in the left upper lobe, which may represent overlapping structures and prominence of the first rib on the left. No pneumothorax. A left-sided PICC line terminates over the superior vena cava. IMPRESSION: Small left pleural effusion with atelectasis or infiltrate at the left lung base. Electronically Signed   By: Thornell Sartorius M.D.   On: 06/08/2023 20:09    Scheduled Meds:  Chlorhexidine Gluconate Cloth  6 each Topical Daily   doxazosin  4 mg Oral QHS   feeding supplement  237 mL Oral BID BM   gabapentin  100 mg Oral QHS   levothyroxine  150 mcg Oral Q0600   lidocaine  1-3 patch Transdermal Q24H   methocarbamol  500 mg Oral QID   polyethylene glycol  17 g Oral Daily   predniSONE  20 mg Oral Q breakfast   senna  2 tablet Oral BID   Continuous Infusions:   LOS: 0 days   Time spent: 60 minutes  Carollee Herter, DO  [facility]  06/10/2023, 4:07 PM

## 2023-06-10 NOTE — TOC Progression Note (Addendum)
 Transition of Care [facility]) - Progression Note    Patient Details  Name: Samuel Little MRN: [id number] Date of Birth: 1932/03/25  [facility]) CM/SW Contact  Howell Rucks, RN Phone Number: 06/10/2023, 2:52 PM  Clinical Narrative:  NCM call to patient's spouse, Samuel Little, to introduce role of TOC/NCM and review for dc planning, per Samuel Little, patient seen by Palliative Medicine Team and family is considering Hospice Care. MOON explained to pt's spouse, states she feels patient should be inpatient, declined to to have NCM sign the form, states she will speak with the attending. MOON completed, will be sent certified mail. TOC will continue to follow.    -4:01pm Teams chat received from attending for Hospice referral, possible [facility] candidate. Samuel Little, [facility] rep-added to teams chat.    Expected Discharge Plan:  (TBD) Barriers to Discharge: Continued Medical Work up  Expected Discharge Plan and Services                                               Social Determinants of Health (SDOH) Interventions SDOH Screenings   Food Insecurity: No Food Insecurity (06/09/2023)  Housing: Low Risk  (06/09/2023)  Transportation Needs: No Transportation Needs (06/09/2023)  Utilities: Not At Risk (06/09/2023)  Social Connections: Moderately Integrated (06/09/2023)  Tobacco Use: Low Risk  (06/09/2023)    Readmission Risk Interventions     No data to display

## 2023-06-11 DIAGNOSIS — R52 Pain, unspecified: Secondary | ICD-10-CM | POA: Diagnosis not present

## 2023-06-11 DIAGNOSIS — Z515 Encounter for palliative care: Secondary | ICD-10-CM | POA: Diagnosis not present

## 2023-06-11 DIAGNOSIS — U071 COVID-19: Secondary | ICD-10-CM | POA: Diagnosis not present

## 2023-06-11 DIAGNOSIS — C9200 Acute myeloblastic leukemia, not having achieved remission: Secondary | ICD-10-CM | POA: Diagnosis not present

## 2023-06-11 DIAGNOSIS — Z79899 Other long term (current) drug therapy: Secondary | ICD-10-CM

## 2023-06-11 DIAGNOSIS — Z711 Person with feared health complaint in whom no diagnosis is made: Secondary | ICD-10-CM

## 2023-06-11 DIAGNOSIS — Z66 Do not resuscitate: Secondary | ICD-10-CM

## 2023-06-11 DIAGNOSIS — Z789 Other specified health status: Secondary | ICD-10-CM | POA: Diagnosis not present

## 2023-06-11 DIAGNOSIS — R4589 Other symptoms and signs involving emotional state: Secondary | ICD-10-CM | POA: Diagnosis not present

## 2023-06-11 LAB — CBC WITH DIFFERENTIAL/PLATELET
Abs Immature Granulocytes: 0.3 10*3/uL — ABNORMAL HIGH (ref 0.00–0.07)
Basophils Absolute: 0 10*3/uL (ref 0.0–0.1)
Basophils Relative: 0 %
Eosinophils Absolute: 0.1 10*3/uL (ref 0.0–0.5)
Eosinophils Relative: 4 %
HCT: 24 % — ABNORMAL LOW (ref 39.0–52.0)
Hemoglobin: 7.9 g/dL — ABNORMAL LOW (ref 13.0–17.0)
Lymphocytes Relative: 43 %
Lymphs Abs: 1.1 10*3/uL (ref 0.7–4.0)
MCH: 34.2 pg — ABNORMAL HIGH (ref 26.0–34.0)
MCHC: 32.9 g/dL (ref 30.0–36.0)
MCV: 103.9 fL — ABNORMAL HIGH (ref 80.0–100.0)
Metamyelocytes Relative: 1 %
Monocytes Absolute: 0.3 10*3/uL (ref 0.1–1.0)
Monocytes Relative: 11 %
Myelocytes: 9 %
Neutro Abs: 0.8 10*3/uL — ABNORMAL LOW (ref 1.7–7.7)
Neutrophils Relative %: 32 %
Platelets: 17 10*3/uL — CL (ref 150–400)
RBC: 2.31 MIL/uL — ABNORMAL LOW (ref 4.22–5.81)
RDW: 20.5 % — ABNORMAL HIGH (ref 11.5–15.5)
WBC: 2.5 10*3/uL — ABNORMAL LOW (ref 4.0–10.5)
nRBC: 2.4 % — ABNORMAL HIGH (ref 0.0–0.2)

## 2023-06-11 MED ORDER — MORPHINE SULFATE (PF) 2 MG/ML IV SOLN: 1.0000 mg | INTRAVENOUS | Status: DC | PRN

## 2023-06-11 MED ORDER — BIOTENE DRY MOUTH MT LIQD: 15.0000 mL | OROMUCOSAL | Status: DC | PRN

## 2023-06-11 MED ORDER — POLYVINYL ALCOHOL 1.4 % OP SOLN: 1.0000 [drp] | Freq: Four times a day (QID) | OPHTHALMIC | Status: DC | PRN

## 2023-06-11 MED ORDER — OXYCODONE HCL 5 MG PO TABS
5.0000 mg | ORAL_TABLET | ORAL | Status: DC | PRN
  Administered 2023-06-11: 5 mg via ORAL
  Administered 2023-06-11: 10 mg via ORAL
  Filled 2023-06-11 (×2): qty 2
  Filled 2023-06-11: qty 1

## 2023-06-11 MED ORDER — LORAZEPAM 2 MG/ML IJ SOLN: 0.5000 mg | INTRAMUSCULAR | Status: DC | PRN

## 2023-06-11 MED ORDER — GLYCOPYRROLATE 0.2 MG/ML IJ SOLN: 0.2000 mg | INTRAMUSCULAR | Status: DC | PRN

## 2023-06-11 NOTE — Progress Notes (Signed)
 AVS given to PTAR and explained over the phone wit\h the receiving nurse at [facility]. Medications and follow up appointments have been explained with pt's receiving nurse verbalizing understanding.

## 2023-06-11 NOTE — Progress Notes (Signed)
 [facility] Room 1440  [facility] Liaison Note   Referral received from hospital care team for family interest in [facility].     Met with patient and family to explain services and hospice philosophy and all questions answered.     [facility] is able to accept patient this afternoon once consents are complete.     RN staff, you may call report at any time to [facility] @ [phone], room is assigned when report is called.     Please leave IV intact and send completed DNR with patient.     Updated attending and [facility] manager via [employer].   Thank you for the opportunity to participate in this patient's care     Roe Rutherford, BSN, RN Hospice Nurse Liaison [phone]

## 2023-06-11 NOTE — Progress Notes (Signed)
 PROGRESS NOTE    Samuel Little  NWG:[id number] DOB: February 02, 1933 DOA: 06/08/2023 PCP: Georgann Housekeeper, MD  Subjective: Pt seen and examined. No family at bedside. Awaiting hospice to see patient and meet with family.   Hospital Course: HPI:  Samuel Little is a 89 y.o. 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  Patient had dislodged [name] of the right upper extremity  2 days ago and a new one was placed on the left upper extremity.  Earlier yesterday patient was noticed to increasing bleeding from the PICC line insertion site and was referred to the ER.  No bleeding from other sites.  No bleeding from the mouth or urine.  2 days ago patient had followed up with Dr. Arbutus Ped oncologist and had received platelet transfusion for low platelets.  Patient otherwise denies any chest pain shortness of breath nausea vomiting diarrhea and is able to ambulate with help of physical therapy.   ED Course: On exam patient has significant peripheral edema.  In the ER after placing pressure the bleeding was able to be stopped.  Labs show hemoglobin of 6.2 which is decreased from 7 two days ago and platelets of 18.  2 units of PRBC transfusion was ordered.  CT chest shows mild bilateral pleural effusion and also signs of cirrhosis of the liver with splenomegaly.  INR is 1.3.  Significant Events: Admitted 06/08/2023 acute blood loss anemia   Significant Labs: Covid POSITIVE Na 133, K 4.4, CO2 of 21, BUN 58, Scr 1.05, glu 141 WBC 2.4, Hgb 6.2, plt 18  Significant Imaging Studies: CXR Small left pleural effusion with  atelectasis or infiltrate at the left lung base.  left-sided PICC line terminates over the superior vena cava.  CT chest Small bilateral pleural effusions, decreased since prior CT. 2. Linear atelectasis in the left lung base. 3. Interstitial pattern to the lung bases likely represents mild edema. 4. Cardiac enlargement. 5. Aortic atherosclerosis.  6. Hepatic cirrhosis with splenic enlargement and upper abdominal ascites. 7. Nonobstructing stones in the kidneys  Antibiotic Therapy: Anti-infectives (From admission, onward)    Start     Dose/Rate Route Frequency Ordered Stop   06/09/23 0800  ceFAZolin (ANCEF) IVPB 2g/100 mL premix        2 g 200 mL/hr over 30 Minutes Intravenous Every 8 hours 06/09/23 0027     06/09/23 0115  ceFAZolin (ANCEF) IVPB 2g/100 mL premix        2 g 200 mL/hr over 30 Minutes Intravenous NOW 06/09/23 0027 06/09/23 0146   06/09/23 0015  ceFAZolin (ANCEF) IVPB  Status:  Discontinued       Note to Pharmacy: Indication:  MSSA Bacteremia First Dose: Yes Last Day of Therapy:  07/06/23 Labs - Once weekly:  CBC/D and BMP, Labs - Once weekly: ESR and CRP     2 g Intravenous Every 8 hours 06/09/23 0011 06/09/23 0027       Procedures:   Consultants: oncology    Assessment and Plan: * AML (acute myeloid leukemia) (HCC) 06-10-2023 note from oncology reviewed yesterday along with discussion with Dr. Candise Che with oncology today and with  Dr. Patterson Hammersmith with palliative care.  Patient has AML.  There are no treatment options for him.  Family is electing to proceed with comfort care measures.  They are notifying family.  Hospice has been notified.  They will see him tomorrow.  IV morphine has been started.  06-11-2023 prognosis is terminal. I do not see the need to keep checking labs or giving PRBC/platelet transfusions. This would be futile care. Transfusion of PRBC or platelet will not change his outcome. Continue IV morphine for pain control.  Bleeding from PICC line, initial  encounter (HCC) 06-09-2023 stopped with pressure dressing  Acute blood loss anemia 06-09-2023 from bleeding PICC site. S/p 2 units PRBC transfusion. Initial HgB of 6.2 g/dl. Post-transfusion HgB of 8.5 g/dl.  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  Prognosis is terminal. Family wants pt to continue to receive PRBC and platelet transfusion during the rest of his hospital stay. This was discussed between family and oncology.  06-11-2023 patient has AML.  Prognosis is terminal. Awaiting hospice to see patient and meet with family. I do not see the need to keep checking labs or giving PRBC/platelet transfusions. This would be futile care. Transfusion of PRBC or platelet will not change his outcome.   Bilateral leg edema 06-09-2023 continue with IV lasix to help with edema. Pt's low serum albumin is likely contributing to his edema.  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  Prognosis is terminal.  Stop IV lasix so the patient will not have the urge to urinate so frequently.  MSSA bacteremia 06-09-2023 continue with IV Ancef as already scheduled when he was discharged from the hospital on 05-29-2023.  Cefazolin 2g IV q8h through 07/06/23   06-10-2023 stop IV abx. patient has AML.  Prognosis is terminal.   Thrombocytopenia (HCC) 06-09-2023 oncology consulted. Pt has been dealing with his low platelet count for about 8 months now. Etiology unknown.  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  Prognosis is terminal.   06-11-2023 patient has AML.  Prognosis is terminal. Awaiting hospice to see patient and meet with family.   Other pancytopenia (HCC) 06-09-2023 oncology consulted. Pt has been dealing with his low platelet count for about 8 months now. Etiology unknown. 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  Prognosis is terminal.  06-11-2023 patient has AML.  Prognosis is terminal. Awaiting  hospice to see patient and meet with family.   Pressure injury of skin Present on admission.  Pressure Injury 06/09/23 Buttocks Right;Left;Mid Stage 2 -  Partial thickness loss of dermis presenting as a shallow open injury with a red, pink wound bed without slough. (Active)  06/09/23 0137  Location: Buttocks  Location Orientation: Right;Left;Mid  Staging: Stage 2 -  Partial thickness loss of dermis presenting as a shallow open injury with a red, pink wound bed without slough.  Wound Description (Comments):   Present on Admission: Yes      COVID-19 virus infection 06-09-2023 on RA. Pt is immunosuppressed.  Macular degeneration 06-09-2023 chronic. Stable.  Spinal stenosis of lumbar region with neurogenic claudication 06-09-2023 chronic. Stable. Continue as needed oxycodone.  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  Prognosis is terminal. Pt now on IV morphine prn.  Gout 06-09-2023 chronic. Stable.  Essential hypertension 06-09-2023 hold ACEI while diuresing. Continue with IV lasix 20 mg bid.  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  Prognosis is terminal. Stop IV lasix  Hypothyroidism 06-09-2023 TSH 7.8. will  check FT4.  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  Prognosis is terminal.    DVT prophylaxis: SCDs Start: 06/09/23 0010  None because of comfort care   Code Status: Limited: Do not attempt resuscitation (DNR) -DNR-LIMITED -Do Not Intubate/DNI  Family Communication: no family at bedside. I spoke to pt's wife and pt's dtr linda yesterday at bedside Disposition Plan: hospice vs SNF Reason for continuing need for hospitalization: remains on IV morphine for pain control. Awaiting disposition to either hospice or SNF. Family unable to give hospice care at home.  Objective: Vitals:   06/10/23 0519 06/10/23 1351 06/10/23 2056 06/11/23 0451  BP: (!) 148/67 (!) 152/62 (!) 133/50 (!) 150/75  Pulse: (!) 107 63 (!) 25 (!) 44  Resp: 16 16    Temp: 99 F (37.2 C) 97.6 F (36.4 C) 97.9 F (36.6 C) 97.9 F  (36.6 C)  TempSrc:  Oral Oral Oral  SpO2: 98% 96% 98% 96%  Weight:      Height:        Intake/Output Summary (Last 24 hours) at 06/11/2023 0941 Last data filed at 06/11/2023 0230 Gross per 24 hour  Intake 320 ml  Output 2000 ml  Net -1680 ml   Filed Weights   06/08/23 1829 06/09/23 0127  Weight: 83.9 kg 104 kg   Examination:  Physical Exam Vitals and nursing note reviewed.  Constitutional:      Comments: Sleeping peacefully. On RA. I did not disturb him.  Pulmonary:     Effort: Pulmonary effort is normal.     Comments: Respirations are easy and unlabored Musculoskeletal:     Right upper arm: Swelling and edema present.     Left upper arm: Swelling and edema present.   Data Reviewed: I have personally reviewed following labs and imaging studies  CBC: Recent Labs  Lab 06/07/23 1119 06/08/23 2019 06/09/23 0033 06/09/23 0905 06/10/23 0421 06/11/23 0337  WBC 5.2 2.4* 2.6* 3.5* 3.0* 2.5*  NEUTROABS 0.7* 0.8*  --  0.5* 0.6* 0.8*  HGB 7.0* 6.2* 6.3* 8.5* 7.7* 7.9*  HCT 20.9* 19.4* 19.9* 25.8* 23.7* 24.0*  MCV 103.5* 108.4* 108.7* 102.8* 103.5* 103.9*  PLT 20* 18* 19* 17* 22* 17*   Basic Metabolic Panel: Recent Labs  Lab 06/07/23 1119 06/08/23 2019 06/09/23 0033 06/10/23 0421  NA 134* 133* 133* 134*  K 4.1 4.4 4.5 3.8  CL 105 106 105 103  CO2 24 21* 23 24  GLUCOSE 103* 141* 139* 82  BUN 43* 58* 60* 58*  CREATININE 0.92 1.05 1.09 1.06  CALCIUM 8.4* 8.3* 8.2* 8.3*  MG  --   --   --  1.8   GFR: Estimated Creatinine Clearance: 58.9 mL/min (by C-G formula based on SCr of 1.06 mg/dL). Liver Function Tests: Recent Labs  Lab 06/07/23 1119 06/08/23 2019 06/09/23 0033 06/10/23 0421  AST 39 45* 43* 39  ALT 6 10 10 9   ALKPHOS 229* 189* 196* 174*  BILITOT 1.3* 1.0 1.2 1.3*  PROT 6.3* 6.0* 5.7* 5.6*  ALBUMIN 2.2* 1.9* 1.8* 1.8*   Coagulation Profile: Recent Labs  Lab 06/09/23 0033  INR 1.3*   BNP (last 3 results) Recent Labs    09/09/22 0830  BNP  221.0*   Thyroid Function Tests: Recent Labs    06/09/23 0905 06/10/23 0421  TSH 7.820*  --   FREET4  --  0.77   Recent Results (from the past 240 hours)  Resp panel by RT-PCR (RSV, Flu A&B, Covid) Anterior Nasal Swab  Status: Abnormal   Collection Time: 06/08/23  7:01 PM   Specimen: Anterior Nasal Swab  Result Value Ref Range Status   SARS Coronavirus 2 by RT PCR POSITIVE (A) NEGATIVE Final    Comment: (NOTE) SARS-CoV-2 target nucleic acids are DETECTED.  The SARS-CoV-2 RNA is generally detectable in upper respiratory specimens during the acute phase of infection. Positive results are indicative of the presence of the identified virus, but do not rule out bacterial infection or co-infection with other pathogens not detected by the test. Clinical correlation with patient history and other diagnostic information is necessary to determine patient infection status. The expected result is Negative.  Fact Sheet for Patients: BloggerCourse.com  Fact Sheet for Healthcare Providers: SeriousBroker.it  This test is not yet approved or cleared by the [place] FDA and  has been authorized for detection and/or diagnosis of SARS-CoV-2 by FDA under an Emergency Use Authorization (EUA).  This EUA will remain in effect (meaning this test can be used) for the duration of  the COVID-19 declaration under Section 564(b)(1) of the A ct, 21 U.S.C. section 360bbb-3(b)(1), unless the authorization is terminated or revoked sooner.     Influenza A by PCR NEGATIVE NEGATIVE Final   Influenza B by PCR NEGATIVE NEGATIVE Final    Comment: (NOTE) The Xpert Xpress SARS-CoV-2/FLU/RSV plus assay is intended as an aid in the diagnosis of influenza from Nasopharyngeal swab specimens and should not be used as a sole basis for treatment. Nasal washings and aspirates are unacceptable for Xpert Xpress SARS-CoV-2/FLU/RSV testing.  Fact Sheet for  Patients: BloggerCourse.com  Fact Sheet for Healthcare Providers: SeriousBroker.it  This test is not yet approved or cleared by the [place] FDA and has been authorized for detection and/or diagnosis of SARS-CoV-2 by FDA under an Emergency Use Authorization (EUA). This EUA will remain in effect (meaning this test can be used) for the duration of the COVID-19 declaration under Section 564(b)(1) of the Act, 21 U.S.C. section 360bbb-3(b)(1), unless the authorization is terminated or revoked.     Resp Syncytial Virus by PCR NEGATIVE NEGATIVE Final    Comment: (NOTE) Fact Sheet for Patients: BloggerCourse.com  Fact Sheet for Healthcare Providers: SeriousBroker.it  This test is not yet approved or cleared by the [place] FDA and has been authorized for detection and/or diagnosis of SARS-CoV-2 by FDA under an Emergency Use Authorization (EUA). This EUA will remain in effect (meaning this test can be used) for the duration of the COVID-19 declaration under Section 564(b)(1) of the Act, 21 U.S.C. section 360bbb-3(b)(1), unless the authorization is terminated or revoked.  Performed at [facility], [address] [place], [place] [address]     Scheduled Meds:  Chlorhexidine Gluconate Cloth  6 each Topical Daily   doxazosin  4 mg Oral QHS   feeding supplement  237 mL Oral BID BM   gabapentin  100 mg Oral QHS   levothyroxine  150 mcg Oral Q0600   lidocaine  1-3 patch Transdermal Q24H   methocarbamol  500 mg Oral QID   polyethylene glycol  17 g Oral Daily   predniSONE  20 mg Oral Q breakfast   senna  2 tablet Oral BID   Continuous Infusions:   LOS: 1 day   Time spent: 40 minutes  Carollee Herter, DO  [facility]  06/11/2023, 9:41 AM

## 2023-06-11 NOTE — Assessment & Plan Note (Signed)
 06-11-2023 pt will be discharged to residential hospice([facility]) today. Keep PICC line.

## 2023-06-11 NOTE — Discharge Summary (Signed)
 [facility] Physician Discharge Summary   Patient name: Samuel Little  Admit date:     06/08/2023  Discharge date: 06/11/2023  Attending Physician: Imogene Burn, Shanti Eichel [[id number]]  Discharge Physician: Carollee Herter   PCP: Georgann Housekeeper, MD  Admitted From: SNF  Disposition:  Residential Hospice  Home Health:No Equipment/Devices: None    Discharge Condition:Hospice CODE STATUS:Comfort Care Diet recommendation: Heart Healthy Fluid Restriction: None  Hospital Summary: HPI:  Samuel Little is a 89 y.o. 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  Patient had dislodged [name] of the right upper extremity  2 days ago and a new one was placed on the left upper extremity.  Earlier yesterday patient was noticed to increasing bleeding from the PICC line insertion site and was referred to the ER.  No bleeding from other sites.  No bleeding from the mouth or urine.  2 days ago patient had followed up with Dr. Arbutus Ped oncologist and had received platelet transfusion for low platelets.  Patient otherwise denies any chest pain shortness of breath nausea vomiting diarrhea and is able to ambulate with help of physical therapy.   ED Course: On exam patient has significant peripheral edema.  In the ER after placing pressure the bleeding was able to be stopped.  Labs show hemoglobin of 6.2 which is decreased from 7 two days ago and platelets of 18.  2 units of PRBC transfusion was ordered.  CT chest shows mild bilateral pleural effusion and also signs of cirrhosis of the liver with splenomegaly.  INR is 1.3.  Significant  Events: Admitted 06/08/2023 acute blood loss anemia   Significant Labs: Covid POSITIVE Na 133, K 4.4, CO2 of 21, BUN 58, Scr 1.05, glu 141 WBC 2.4, Hgb 6.2, plt 18  Significant Imaging Studies: CXR Small left pleural effusion with atelectasis or infiltrate at the left lung base.  left-sided PICC line terminates over the superior vena cava.  CT chest Small bilateral pleural effusions, decreased since prior CT. 2. Linear atelectasis in the left lung base. 3. Interstitial pattern to the lung bases likely represents mild edema. 4. Cardiac enlargement. 5. Aortic atherosclerosis.  6. Hepatic cirrhosis with splenic enlargement and upper abdominal ascites. 7. Nonobstructing stones in the kidneys  Antibiotic Therapy: Anti-infectives (From admission, onward)    Start     Dose/Rate Route Frequency Ordered Stop   06/09/23 0800  ceFAZolin (ANCEF) IVPB 2g/100 mL premix        2 g 200 mL/hr over 30 Minutes Intravenous Every 8 hours 06/09/23 0027     06/09/23 0115  ceFAZolin (ANCEF) IVPB 2g/100 mL premix        2 g 200 mL/hr over 30 Minutes Intravenous NOW 06/09/23 0027 06/09/23 0146   06/09/23 0015  ceFAZolin (ANCEF) IVPB  Status:  Discontinued       Note to Pharmacy: Indication:  MSSA Bacteremia First Dose: Yes Last Day of Therapy:  07/06/23 Labs - Once weekly:  CBC/D and BMP, Labs - Once weekly: ESR and CRP     2 g Intravenous Every 8 hours 06/09/23 0011 06/09/23 0027       Procedures:  Consultants: oncology   Hospital Course by Problem: * AML (acute myeloid leukemia) (HCC) 06-10-2023 note from oncology reviewed yesterday along with discussion with Dr. Candise Che with oncology today and with Dr. Patterson Hammersmith with palliative care.  Patient has AML.  There are no treatment options for him.  Family is electing to proceed with comfort care measures.  They are notifying family.  Hospice has been notified.  They will see him tomorrow.  IV morphine has been started.  06-11-2023 prognosis is terminal. I do  not see the need to keep checking labs or giving PRBC/platelet transfusions. This would be futile care. Transfusion of PRBC or platelet will not change his outcome. Continue IV morphine for pain control.  *update. Pt seen by hospice. Pt accepted to [facility] today.  Transition from acute care to hospice 06-11-2023 pt will be discharged to residential hospice([facility]) today. Keep PICC line.  Bleeding from PICC line, initial encounter (HCC) 06-09-2023 stopped with pressure dressing  Acute blood loss anemia 06-09-2023 from bleeding PICC site. S/p 2 units PRBC transfusion. Initial HgB of 6.2 g/dl. Post-transfusion HgB of 8.5 g/dl.  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  Prognosis is terminal. Family wants pt to continue to receive PRBC and platelet transfusion during the rest of his hospital stay. This was discussed between family and oncology.  06-11-2023 patient has AML.  Prognosis is terminal. Awaiting hospice to see patient and meet with family. I do not see the need to keep checking labs or giving PRBC/platelet transfusions. This would be futile care. Transfusion of PRBC or platelet will not change his outcome.   Bilateral leg edema 06-09-2023 continue with IV lasix to help with edema. Pt's low serum albumin is likely contributing to his edema.  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  Prognosis is terminal.  Stop IV lasix so the patient will not have the urge to urinate so frequently.  MSSA bacteremia 06-09-2023 continue with IV Ancef as already scheduled when he was discharged from the hospital on 05-29-2023.  Cefazolin 2g IV q8h through 07/06/23   06-10-2023 stop IV abx. patient has AML.  Prognosis is terminal.   Thrombocytopenia (HCC) 06-09-2023 oncology consulted. Pt has been dealing with his low platelet count for about 8 months now. Etiology unknown.  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  Prognosis is terminal.   06-11-2023  patient has AML.  Prognosis is terminal. Awaiting hospice to see patient and meet with family.   Other pancytopenia (HCC) 06-09-2023 oncology consulted. Pt has been dealing with his low platelet count for about 8 months now. Etiology unknown. 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  Prognosis is terminal.  06-11-2023 patient has AML.  Prognosis is terminal. Awaiting hospice to see patient and meet with family.   Pressure injury of skin Present on admission.  Pressure Injury 06/09/23 Buttocks Right;Left;Mid Stage 2 -  Partial thickness loss of dermis presenting as a shallow open injury with a red, pink wound bed without slough. (Active)  06/09/23 0137  Location: Buttocks  Location Orientation: Right;Left;Mid  Staging: Stage 2 -  Partial thickness loss of dermis presenting as a shallow open injury with a red, pink wound bed without slough.  Wound Description (Comments):   Present on Admission: Yes      COVID-19 virus infection 06-09-2023 on RA. Pt is immunosuppressed.  Macular degeneration 06-09-2023 chronic. Stable.  Spinal stenosis of lumbar region  with neurogenic claudication 06-09-2023 chronic. Stable. Continue as needed oxycodone.  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  Prognosis is terminal. Pt now on IV morphine prn.  Gout 06-09-2023 chronic. Stable.  Essential hypertension 06-09-2023 hold ACEI while diuresing. Continue with IV lasix 20 mg bid.  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  Prognosis is terminal. Stop IV lasix  Hypothyroidism 06-09-2023 TSH 7.8. will check FT4.  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  Prognosis is terminal.    DNR (do not resuscitate) 06-11-2023 pt is DNR/DNI/Comfort care.    Discharge Diagnoses:  Principal Problem:   AML (acute myeloid leukemia) (HCC) Active Problems:   Bleeding from PICC line, initial encounter (HCC)   Transition from acute care to hospice   Other pancytopenia (HCC)    Thrombocytopenia (HCC)   MSSA bacteremia   Bilateral leg edema   Acute blood loss anemia   Hypothyroidism   Essential hypertension   Gout   Spinal stenosis of lumbar region with neurogenic claudication   Macular degeneration   COVID-19 virus infection   Pressure injury of skin   Goals of care, counseling/discussion   Need for emotional support   Pain   Constipation   Counseling and coordination of care   Palliative care encounter   ACP (advance care planning)   High risk medication use   Concern about end of life   DNR (do not resuscitate)   Discharge Instructions  Discharge Instructions     Diet - low sodium heart healthy   Complete by: As directed    No dressing needed   Complete by: As directed       Allergies as of 06/11/2023       Reactions   Nsaids Other (See Comments)   JAUNDICE   Celecoxib Nausea And Vomiting   Meperidine Hcl Nausea Only        Medication List     STOP taking these medications    acetaminophen 650 MG CR tablet Commonly known as: TYLENOL   ascorbic acid 500 MG tablet Commonly known as: VITAMIN C   bisacodyl 10 MG suppository Commonly known as: DULCOLAX   ceFAZolin IVPB Commonly known as: ANCEF   docusate sodium 100 MG capsule Commonly known as: COLACE   doxazosin 4 MG tablet Commonly known as: CARDURA   feeding supplement Liqd   gabapentin 100 MG capsule Commonly known as: NEURONTIN   heparin lock flush 100 UNIT/ML Soln injection   levothyroxine 150 MCG tablet Commonly known as: SYNTHROID   lidocaine 5 % Commonly known as: LIDODERM   lisinopril 30 MG tablet Commonly known as: ZESTRIL   methocarbamol 500 MG tablet Commonly known as: ROBAXIN   Milk of Magnesia 1200 MG/15ML suspension Generic drug: magnesium hydroxide   mineral oil enema   multivitamin with minerals Tabs tablet   naphazoline-glycerin 0.012-0.25 % Soln Commonly known as: CLEAR EYES REDNESS   oxyCODONE 5 MG immediate release  tablet Commonly known as: Oxy IR/ROXICODONE   polyethylene glycol 17 g packet Commonly known as: MIRALAX / GLYCOLAX   predniSONE 10 MG tablet Commonly known as: DELTASONE   PROSTAT PO   sodium chloride 0.9 % infusion               Discharge Care Instructions  (From admission, onward)           Start     Ordered   06/11/23 0000  No dressing needed        06/11/23 1532  Allergies  Allergen Reactions   Nsaids Other (See Comments)    JAUNDICE   Celecoxib Nausea And Vomiting   Meperidine Hcl Nausea Only    Discharge Exam: Vitals:   06/10/23 2056 06/11/23 0451  BP: (!) 133/50 (!) 150/75  Pulse: (!) 25 (!) 44  Resp:    Temp: 97.9 F (36.6 C) 97.9 F (36.6 C)  SpO2: 98% 96%    Physical Exam Vitals and nursing note reviewed.  Constitutional:      Comments: Sleeping peacefully. On RA. I did not disturb him.  Pulmonary:     Effort: Pulmonary effort is normal.     Comments: Respirations are easy and unlabored Musculoskeletal:     Right upper arm: Swelling and edema present.     Left upper arm: Swelling and edema present.     The results of significant diagnostics from this hospitalization (including imaging, microbiology, ancillary and laboratory) are listed below for reference.    Microbiology: Recent Results (from the past 240 hours)  Resp panel by RT-PCR (RSV, Flu A&B, Covid) Anterior Nasal Swab     Status: Abnormal   Collection Time: 06/08/23  7:01 PM   Specimen: Anterior Nasal Swab  Result Value Ref Range Status   SARS Coronavirus 2 by RT PCR POSITIVE (A) NEGATIVE Final    Comment: (NOTE) SARS-CoV-2 target nucleic acids are DETECTED.  The SARS-CoV-2 RNA is generally detectable in upper respiratory specimens during the acute phase of infection. Positive results are indicative of the presence of the identified virus, but do not rule out bacterial infection or co-infection with other pathogens not detected by the test. Clinical  correlation with patient history and other diagnostic information is necessary to determine patient infection status. The expected result is Negative.  Fact Sheet for Patients: BloggerCourse.com  Fact Sheet for Healthcare Providers: SeriousBroker.it  This test is not yet approved or cleared by the [place] FDA and  has been authorized for detection and/or diagnosis of SARS-CoV-2 by FDA under an Emergency Use Authorization (EUA).  This EUA will remain in effect (meaning this test can be used) for the duration of  the COVID-19 declaration under Section 564(b)(1) of the A ct, 21 U.S.C. section 360bbb-3(b)(1), unless the authorization is terminated or revoked sooner.     Influenza A by PCR NEGATIVE NEGATIVE Final   Influenza B by PCR NEGATIVE NEGATIVE Final    Comment: (NOTE) The Xpert Xpress SARS-CoV-2/FLU/RSV plus assay is intended as an aid in the diagnosis of influenza from Nasopharyngeal swab specimens and should not be used as a sole basis for treatment. Nasal washings and aspirates are unacceptable for Xpert Xpress SARS-CoV-2/FLU/RSV testing.  Fact Sheet for Patients: BloggerCourse.com  Fact Sheet for Healthcare Providers: SeriousBroker.it  This test is not yet approved or cleared by the [place] FDA and has been authorized for detection and/or diagnosis of SARS-CoV-2 by FDA under an Emergency Use Authorization (EUA). This EUA will remain in effect (meaning this test can be used) for the duration of the COVID-19 declaration under Section 564(b)(1) of the Act, 21 U.S.C. section 360bbb-3(b)(1), unless the authorization is terminated or revoked.     Resp Syncytial Virus by PCR NEGATIVE NEGATIVE Final    Comment: (NOTE) Fact Sheet for Patients: BloggerCourse.com  Fact Sheet for Healthcare  Providers: SeriousBroker.it  This test is not yet approved or cleared by the [place] FDA and has been authorized for detection and/or diagnosis of SARS-CoV-2 by FDA under an Emergency Use Authorization (EUA). This EUA will  remain in effect (meaning this test can be used) for the duration of the COVID-19 declaration under Section 564(b)(1) of the Act, 21 U.S.C. section 360bbb-3(b)(1), unless the authorization is terminated or revoked.  Performed at [facility], [address] [place], [place] [address]      Labs: BNP (last 3 results) Recent Labs    09/09/22 0830  BNP 221.0*   Basic Metabolic Panel: Recent Labs  Lab 06/07/23 1119 06/08/23 2019 06/09/23 0033 06/10/23 0421  NA 134* 133* 133* 134*  K 4.1 4.4 4.5 3.8  CL 105 106 105 103  CO2 24 21* 23 24  GLUCOSE 103* 141* 139* 82  BUN 43* 58* 60* 58*  CREATININE 0.92 1.05 1.09 1.06  CALCIUM 8.4* 8.3* 8.2* 8.3*  MG  --   --   --  1.8   Liver Function Tests: Recent Labs  Lab 06/07/23 1119 06/08/23 2019 06/09/23 0033 06/10/23 0421  AST 39 45* 43* 39  ALT 6 10 10 9   ALKPHOS 229* 189* 196* 174*  BILITOT 1.3* 1.0 1.2 1.3*  PROT 6.3* 6.0* 5.7* 5.6*  ALBUMIN 2.2* 1.9* 1.8* 1.8*   CBC: Recent Labs  Lab 06/07/23 1119 06/08/23 2019 06/09/23 0033 06/09/23 0905 06/10/23 0421 06/11/23 0337  WBC 5.2 2.4* 2.6* 3.5* 3.0* 2.5*  NEUTROABS 0.7* 0.8*  --  0.5* 0.6* 0.8*  HGB 7.0* 6.2* 6.3* 8.5* 7.7* 7.9*  HCT 20.9* 19.4* 19.9* 25.8* 23.7* 24.0*  MCV 103.5* 108.4* 108.7* 102.8* 103.5* 103.9*  PLT 20* 18* 19* 17* 22* 17*   Thyroid function studies Recent Labs    06/09/23 0905 06/10/23 0421  TSH 7.820*  --   FREET4  --  0.77   Urinalysis    Component Value Date/Time   COLORURINE AMBER (A) 05/22/2023 2040   APPEARANCEUR HAZY (A) 05/22/2023 2040   LABSPEC 1.012 05/22/2023 2040   PHURINE 5.0 05/22/2023 2040   GLUCOSEU NEGATIVE 05/22/2023 2040   HGBUR MODERATE  (A) 05/22/2023 2040   BILIRUBINUR NEGATIVE 05/22/2023 2040   KETONESUR NEGATIVE 05/22/2023 2040   PROTEINUR NEGATIVE 05/22/2023 2040   UROBILINOGEN 0.2 12/18/2013 0036   NITRITE NEGATIVE 05/22/2023 2040   LEUKOCYTESUR MODERATE (A) 05/22/2023 2040   Sepsis Labs Recent Labs  Lab 06/09/23 0033 06/09/23 0905 06/10/23 0421 06/11/23 0337  WBC 2.6* 3.5* 3.0* 2.5*    Procedures/Studies: VAS [place] LOWER EXTREMITY VENOUS (DVT) Result Date: 06/09/2023  Lower Venous DVT Study Patient Name:  GRAESYN SCHREIFELS  Date of Exam:   06/09/2023 Medical Rec #: [id number]       Accession #:    [id number] Date of Birth: Jan 23, 1933       Patient Gender: M Patient Age:   58 years Exam Location:  [facility] Procedure:      VAS [place] LOWER EXTREMITY VENOUS (DVT) Referring Phys: Midge Minium --------------------------------------------------------------------------------  Indications: Edema.  Risk Factors: None identified. Limitations: Poor ultrasound/tissue interface and patient positioning. Comparison Study: No prior studies. Performing Technologist: Chanda Busing RVT  Examination Guidelines: A complete evaluation includes B-mode imaging, spectral Doppler, color Doppler, and power Doppler as needed of all accessible portions of each vessel. Bilateral testing is considered an integral part of a complete examination. Limited examinations for reoccurring indications may be performed as noted. The reflux portion of the exam is performed with the patient in reverse Trendelenburg.  +---------+---------------+---------+-----------+----------+-------------------+ RIGHT    CompressibilityPhasicitySpontaneityPropertiesThrombus Aging      +---------+---------------+---------+-----------+----------+-------------------+ CFV      Full  Yes      Yes                                      +---------+---------------+---------+-----------+----------+-------------------+ SFJ      Full                                                              +---------+---------------+---------+-----------+----------+-------------------+ FV Prox  Full                                                             +---------+---------------+---------+-----------+----------+-------------------+ FV Mid                  Yes      Yes                                      +---------+---------------+---------+-----------+----------+-------------------+ FV Distal               Yes      Yes                                      +---------+---------------+---------+-----------+----------+-------------------+ PFV      Full                                                             +---------+---------------+---------+-----------+----------+-------------------+ POP      Full           Yes      Yes                                      +---------+---------------+---------+-----------+----------+-------------------+ PTV      Full                                                             +---------+---------------+---------+-----------+----------+-------------------+ PERO                                                  Not well visualized +---------+---------------+---------+-----------+----------+-------------------+   +---------+---------------+---------+-----------+----------+--------------+ LEFT     CompressibilityPhasicitySpontaneityPropertiesThrombus Aging +---------+---------------+---------+-----------+----------+--------------+ CFV      Full           Yes      Yes                                 +---------+---------------+---------+-----------+----------+--------------+  SFJ      Full                                                        +---------+---------------+---------+-----------+----------+--------------+ FV Prox  Full                                                        +---------+---------------+---------+-----------+----------+--------------+ FV Mid                   Yes      Yes                                 +---------+---------------+---------+-----------+----------+--------------+ FV Distal               Yes      Yes                                 +---------+---------------+---------+-----------+----------+--------------+ PFV      Full                                                        +---------+---------------+---------+-----------+----------+--------------+ POP      Full           Yes      Yes                                 +---------+---------------+---------+-----------+----------+--------------+ PTV      Full                                                        +---------+---------------+---------+-----------+----------+--------------+ PERO     Full                                                        +---------+---------------+---------+-----------+----------+--------------+     Summary: RIGHT: - There is no evidence of deep vein thrombosis in the lower extremity. However, portions of this examination were limited- see technologist comments above.  - No cystic structure found in the popliteal fossa.  LEFT: - There is no evidence of deep vein thrombosis in the lower extremity. However, portions of this examination were limited- see technologist comments above.  - No cystic structure found in the popliteal fossa.  *See table(s) above for measurements and observations. Electronically signed by Carolynn Sayers on 06/09/2023 at 12:48:57 PM.    Final    CT Chest W Contrast Result Date: 06/08/2023 CLINICAL DATA:  Pneumonia with complication suspected. X-ray  done. Respiratory illness with nondiagnostic x-ray. Possible COVID. Bilateral pitting edema. EXAM: CT CHEST WITH CONTRAST TECHNIQUE: Multidetector CT imaging of the chest was performed during intravenous contrast administration. RADIATION DOSE REDUCTION: This exam was performed according to the departmental dose-optimization program which includes automated  exposure control, adjustment of the mA and/or kV according to patient size and/or use of iterative reconstruction technique. CONTRAST:  75mL OMNIPAQUE IOHEXOL 300 MG/ML  SOLN COMPARISON:  Chest radiograph 06/08/2023.  CT chest 09/09/2022 FINDINGS: Cardiovascular: Cardiac enlargement. No pericardial effusions. Normal caliber thoracic aorta. Calcification of the aorta and coronary arteries. Mediastinum/Nodes: Esophagus is decompressed. Mediastinal lymph nodes are not pathologically enlarged. Lungs/Pleura: Small bilateral pleural effusions are decreased in size since prior CT. Interstitial changes in the lung bases likely representing edema. Linear opacities in the left base are likely linear atelectasis. No pneumothorax. Upper Abdomen: Small amount of upper abdominal ascites. Several stones demonstrated in both kidneys. Largest visualized on the right is 1.4 cm in diameter. No visualized hydronephrosis although kidneys are not entirely included within the field of view. Hepatic cirrhosis with enlarged lateral segment left lobe and caudate lobe of the liver. Nodular contour to the liver. Spleen is enlarged. Gallbladder is surgically absent. Musculoskeletal: Degenerative changes in the spine. No acute bony abnormalities. IMPRESSION: 1. Small bilateral pleural effusions, decreased since prior CT. 2. Linear atelectasis in the left lung base. 3. Interstitial pattern to the lung bases likely represents mild edema. 4. Cardiac enlargement. 5. Aortic atherosclerosis. 6. Hepatic cirrhosis with splenic enlargement and upper abdominal ascites. 7. Nonobstructing stones in the kidneys. Electronically Signed   By: Burman Nieves M.D.   On: 06/08/2023 22:28   DG Chest Port 1 View Result Date: 06/08/2023 CLINICAL DATA:  Cough, bleeding from PICC, possible COVID. Bilateral pitting edema in legs. EXAM: PORTABLE CHEST 1 VIEW COMPARISON:  10/27/2022. FINDINGS: Heart is enlarged and the mediastinal contour is within normal limits.  There is atherosclerotic calcification of the aorta. There is mild airspace disease at the left lung base with a chronic left pleural effusion. The right lung is clear. A stable opacity is noted in the left upper lobe, which may represent overlapping structures and prominence of the first rib on the left. No pneumothorax. A left-sided PICC line terminates over the superior vena cava. IMPRESSION: Small left pleural effusion with atelectasis or infiltrate at the left lung base. Electronically Signed   By: Thornell Sartorius M.D.   On: 06/08/2023 20:09   [place] EKG SITE RITE Result Date: 05/29/2023 If Site Rite image not attached, placement could not be confirmed due to current cardiac rhythm.  CT MAXILLOFACIAL W CONTRAST Result Date: 05/26/2023 CLINICAL DATA:  Abscess EXAM: CT MAXILLOFACIAL WITH CONTRAST TECHNIQUE: Multidetector CT imaging of the maxillofacial structures was performed with intravenous contrast. Multiplanar CT image reconstructions were also generated. RADIATION DOSE REDUCTION: This exam was performed according to the departmental dose-optimization program which includes automated exposure control, adjustment of the mA and/or kV according to patient size and/or use of iterative reconstruction technique. CONTRAST:  75mL OMNIPAQUE IOHEXOL 350 MG/ML SOLN COMPARISON:  None Available. FINDINGS: Osseous: No fracture or mandibular dislocation. No destructive process. Orbits: Negative. No traumatic or inflammatory finding. Sinuses: Clear. Soft tissues: Negative. Limited intracranial: No significant or unexpected finding. IMPRESSION: No acute abnormality of the face. Electronically Signed   By: Deatra Robinson M.D.   On: 05/26/2023 00:31   ECHOCARDIOGRAM COMPLETE Result Date: 05/24/2023    ECHOCARDIOGRAM REPORT   Patient Name:   AIDIN DOANE Date  of Exam: 05/24/2023 Medical Rec #:  [id number]      Height:       73.0 in Accession #:    [id number]     Weight:       190.7 lb Date of Birth:  Feb 28, 1933      BSA:           2.109 m Patient Age:    89 years       BP:           120/57 mmHg Patient Gender: M              HR:           74 bpm. Exam Location:  Inpatient Procedure: 2D Echo, Cardiac Doppler, Color Doppler and Intracardiac            Opacification Agent (Both Spectral and Color Flow Doppler were            utilized during procedure). Indications:    Endocarditis  History:        Patient has prior history of Echocardiogram examinations, most                 recent 09/10/2022. Risk Factors:Hypertension.  Sonographer:    Amy Chionchio Referring Phys: [id number] [facility] IMPRESSIONS  1. Left ventricular ejection fraction, by estimation, is 60 to 65%. The left ventricle has normal function. The left ventricle has no regional wall motion abnormalities. There is mild concentric left ventricular hypertrophy. Left ventricular diastolic parameters were normal.  2. Right ventricular systolic function is normal. The right ventricular size is normal.  3. The mitral valve is degenerative. Trivial mitral valve regurgitation. No evidence of mitral stenosis.  4. Moderate tricuspid stenosis.  5. The aortic valve is tricuspid. There is mild calcification of the aortic valve. Aortic valve regurgitation is trivial. Aortic valve sclerosis/calcification is present, without any evidence of aortic stenosis.  6. Aortic dilatation noted. There is mild dilatation of the ascending aorta, measuring 43 mm. There is borderline dilatation of the aortic root, measuring 39 mm.  7. The inferior vena cava is normal in size with <50% respiratory variability, suggesting right atrial pressure of 8 mmHg. FINDINGS  Left Ventricle: Left ventricular ejection fraction, by estimation, is 60 to 65%. The left ventricle has normal function. The left ventricle has no regional wall motion abnormalities. Definity contrast agent was given IV to delineate the left ventricular  endocardial borders. The left ventricular internal cavity size was normal in size. There is mild  concentric left ventricular hypertrophy. Left ventricular diastolic parameters were normal. Right Ventricle: The right ventricular size is normal. No increase in right ventricular wall thickness. Right ventricular systolic function is normal. Left Atrium: Left atrial size was normal in size. Right Atrium: Right atrial size was normal in size. Pericardium: There is no evidence of pericardial effusion. Mitral Valve: The mitral valve is degenerative in appearance. There is mild calcification of the mitral valve leaflet(s). Trivial mitral valve regurgitation. No evidence of mitral valve stenosis. MV peak gradient, 2.4 mmHg. The mean mitral valve gradient  is 1.0 mmHg. Tricuspid Valve: The tricuspid valve is normal in structure. Tricuspid valve regurgitation is trivial. Moderate tricuspid stenosis. Aortic Valve: The aortic valve is tricuspid. There is mild calcification of the aortic valve. Aortic valve regurgitation is trivial. Aortic valve sclerosis/calcification is present, without any evidence of aortic stenosis. Aortic valve mean gradient measures 6.0 mmHg. Aortic valve peak gradient measures 12.0 mmHg. Aortic valve area, by VTI measures 2.33  cm. Pulmonic Valve: The pulmonic valve was normal in structure. Pulmonic valve regurgitation is trivial. No evidence of pulmonic stenosis. Aorta: Aortic dilatation noted. There is mild dilatation of the ascending aorta, measuring 43 mm. There is borderline dilatation of the aortic root, measuring 39 mm. Venous: The inferior vena cava is normal in size with less than 50% respiratory variability, suggesting right atrial pressure of 8 mmHg. IAS/Shunts: No atrial level shunt detected by color flow Doppler.  LEFT VENTRICLE PLAX 2D LVIDd:         5.20 cm      Diastology LVIDs:         3.70 cm      LV e' medial:    9.25 cm/s LV PW:         1.00 cm      LV E/e' medial:  8.7 LV IVS:        1.10 cm      LV e' lateral:   11.00 cm/s LVOT diam:     2.10 cm      LV E/e' lateral: 7.3 LV SV:          77 LV SV Index:   36 LVOT Area:     3.46 cm  LV Volumes (MOD) LV vol d, MOD A2C: 164.0 ml LV vol d, MOD A4C: 143.0 ml LV vol s, MOD A2C: 73.2 ml LV vol s, MOD A4C: 61.6 ml LV SV MOD A2C:     90.8 ml LV SV MOD A4C:     143.0 ml LV SV MOD BP:      97.2 ml RIGHT VENTRICLE             IVC RV Basal diam:  3.30 cm     IVC diam: 2.00 cm RV S prime:     11.40 cm/s TAPSE (M-mode): 1.7 cm LEFT ATRIUM              Index        RIGHT ATRIUM           Index LA Vol (A2C):   109.0 ml 51.70 ml/m  RA Area:     12.20 cm LA Vol (A4C):   93.8 ml  44.49 ml/m  RA Volume:   23.30 ml  11.05 ml/m LA Biplane Vol: 103.0 ml 48.85 ml/m  AORTIC VALVE                     PULMONIC VALVE AV Area (Vmax):    2.18 cm      PV Vmax:       0.86 m/s AV Area (Vmean):   2.10 cm      PV Peak grad:  3.0 mmHg AV Area (VTI):     2.33 cm AV Vmax:           173.00 cm/s AV Vmean:          119.000 cm/s AV VTI:            0.330 m AV Peak Grad:      12.0 mmHg AV Mean Grad:      6.0 mmHg LVOT Vmax:         109.00 cm/s LVOT Vmean:        72.200 cm/s LVOT VTI:          0.222 m LVOT/AV VTI ratio: 0.67  AORTA Ao Root diam: 3.90 cm Ao Asc diam:  4.25 cm MITRAL VALVE MV Area (PHT): 3.40 cm    SHUNTS MV  Area VTI:   3.46 cm    Systemic VTI:  0.22 m MV Peak grad:  2.4 mmHg    Systemic Diam: 2.10 cm MV Mean grad:  1.0 mmHg MV Vmax:       0.78 m/s MV Vmean:      45.5 cm/s MV Decel Time: 223 msec MV E velocity: 80.70 cm/s MV A velocity: 48.20 cm/s MV E/A ratio:  1.67 Arvilla Meres MD Electronically signed by Arvilla Meres MD Signature Date/Time: 05/24/2023/12:45:28 PM    Final    MR THORACIC SPINE WO CONTRAST Result Date: 05/24/2023 CLINICAL DATA:  Infection EXAM: MRI THORACIC AND LUMBAR SPINE WITHOUT CONTRAST TECHNIQUE: Multiplanar and multiecho pulse sequences of the thoracic and lumbar spine were obtained without intravenous contrast. COMPARISON:  01/18/2022 lumbar spine MRI FINDINGS: MRI THORACIC SPINE FINDINGS Alignment:  Physiologic. Vertebrae: No  fracture, evidence of discitis, or bone lesion. Cord:  Normal signal and morphology. Paraspinal and other soft tissues: Negative. Disc levels: T2-3: Small disc bulge without spinal canal stenosis. No other spinal canal or neural foraminal stenosis. MRI LUMBAR SPINE FINDINGS Segmentation:  Standard. Alignment:  Grade 1 retrolisthesis at L1-2, L2-3 and L3-4 Vertebrae:  No fracture, evidence of discitis, or bone lesion. Conus medullaris and cauda equina: Conus extends to the L1 level. Conus and cauda equina appear normal. Paraspinal and other soft tissues: Negative. Disc levels: L1-L2: Small disc bulge with endplate spurring. No spinal canal stenosis. Severe right and mild left neural foraminal stenosis. L2-L3: Small disc bulge. No spinal canal stenosis. No neural foraminal stenosis. L3-L4: Intermediate sized disc bulge with endplate spurring, unchanged. Moderate spinal canal stenosis. Mild right and severe left neural foraminal stenosis. L4-L5: Unchanged mild disc bulge with endplate spurring. Narrowing of both lateral recesses without central spinal canal stenosis. Moderate bilateral neural foraminal stenosis. L5-S1: Intermediate sized disc bulge with mild facet hypertrophy. Unchanged moderate spinal canal stenosis. Severe bilateral neural foraminal stenosis. Visualized sacrum: Normal. IMPRESSION: 1. No acute abnormality of the thoracic or lumbar spine. 2. Unchanged moderate spinal canal stenosis at L3-4 and L5-S1. 3. Unchanged severe right L1-2, left L3-4 and bilateral L5-S1 neural foraminal stenosis. 4. 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 Electronically Signed   By: Deatra Robinson M.D.   On: 05/24/2023 01:43   MR LUMBAR SPINE WO CONTRAST Result Date: 05/24/2023 CLINICAL DATA:  Infection EXAM: MRI THORACIC AND LUMBAR SPINE WITHOUT CONTRAST TECHNIQUE: Multiplanar and multiecho pulse sequences of the thoracic and lumbar spine were obtained without intravenous contrast. COMPARISON:   01/18/2022 lumbar spine MRI FINDINGS: MRI THORACIC SPINE FINDINGS Alignment:  Physiologic. Vertebrae: No fracture, evidence of discitis, or bone lesion. Cord:  Normal signal and morphology. Paraspinal and other soft tissues: Negative. Disc levels: T2-3: Small disc bulge without spinal canal stenosis. No other spinal canal or neural foraminal stenosis. MRI LUMBAR SPINE FINDINGS Segmentation:  Standard. Alignment:  Grade 1 retrolisthesis at L1-2, L2-3 and L3-4 Vertebrae:  No fracture, evidence of discitis, or bone lesion. Conus medullaris and cauda equina: Conus extends to the L1 level. Conus and cauda equina appear normal. Paraspinal and other soft tissues: Negative. Disc levels: L1-L2: Small disc bulge with endplate spurring. No spinal canal stenosis. Severe right and mild left neural foraminal stenosis. L2-L3: Small disc bulge. No spinal canal stenosis. No neural foraminal stenosis. L3-L4: Intermediate sized disc bulge with endplate spurring, unchanged. Moderate spinal canal stenosis. Mild right and severe left neural foraminal stenosis. L4-L5: Unchanged mild disc bulge with endplate spurring. Narrowing of both lateral recesses without central  spinal canal stenosis. Moderate bilateral neural foraminal stenosis. L5-S1: Intermediate sized disc bulge with mild facet hypertrophy. Unchanged moderate spinal canal stenosis. Severe bilateral neural foraminal stenosis. Visualized sacrum: Normal. IMPRESSION: 1. No acute abnormality of the thoracic or lumbar spine. 2. Unchanged moderate spinal canal stenosis at L3-4 and L5-S1. 3. Unchanged severe right L1-2, left L3-4 and bilateral L5-S1 neural foraminal stenosis. 4. 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 Electronically Signed   By: Deatra Robinson M.D.   On: 05/24/2023 01:43   US RENAL Result Date: 05/23/2023 CLINICAL DATA:  Acute kidney injury. EXAM: RENAL / URINARY TRACT ULTRASOUND COMPLETE COMPARISON:  None Available. FINDINGS: Right Kidney: Renal  measurements: 8.2 cm x 5.3 cm x 4.5 cm = volume: 109.4 mL. Echogenicity within normal limits. A 17.7 mm shadowing echogenic renal calculus is seen within the right kidney. No mass or hydronephrosis visualized. Left Kidney: Renal measurements: 11.8 cm x 4.1 cm x 4.7 cm = volume: 119 mL. Echogenicity within normal limits. An 8.9 mm shadowing echogenic renal calculus is seen within the left kidney. No mass or hydronephrosis visualized. Bladder: Echogenic material is seen within the bladder lumen. Other: It should be noted that the study is technically limited secondary to overlying bowel gas, as per the ultrasound technologist. Of incidental note is the presence of an enlarged spleen (17.4 cm in length). IMPRESSION: 1. Bilateral nonobstructing renal calculi. 2. Echogenic material within the urinary bladder which may represent sequelae associated with recent infection. Correlation with urinalysis is recommended. 3. Splenomegaly. Electronically Signed   By: Aram Candela M.D.   On: 05/23/2023 01:11   CT Renal Stone Study Result Date: 05/22/2023 CLINICAL DATA:  Abdominal/flank pain, stone suspected EXAM: CT ABDOMEN AND PELVIS WITHOUT CONTRAST TECHNIQUE: Multidetector CT imaging of the abdomen and pelvis was performed following the standard protocol without IV contrast. RADIATION DOSE REDUCTION: This exam was performed according to the departmental dose-optimization program which includes automated exposure control, adjustment of the mA and/or kV according to patient size and/or use of iterative reconstruction technique. COMPARISON:  11/20/2017 FINDINGS: Lower chest: Small left pleural effusion. Linear atelectasis in the left lower lobe. Hepatobiliary: Unremarkable unenhanced appearance of the liver. Clips in the gallbladder fossa postcholecystectomy. No biliary dilatation. Pancreas: No ductal dilatation or inflammation. Spleen: The spleen is enlarged, 15.4 cm cranial caudal. Adrenals/Urinary Tract: No adrenal  nodule. There are bilateral nonobstructing intrarenal calculi. No hydronephrosis. Both ureters are decompressed without ureteral stone. The urinary bladder is partially distended, no bladder stone or wall thickening. Stomach/Bowel: Small hiatal hernia. The stomach is nondistended. No small bowel obstruction or inflammatory change. Moderate diffuse colonic diverticulosis. Equivocal faint stranding about a diverticulum involving the distal descending colon series 6, image 81, may represent mild diverticulitis. Moderate volume of stool in the colon. Sigmoid colon is redundant. Vascular/Lymphatic: Aortic atherosclerosis. The aorta is tortuous but nonaneurysmal. No bulky abdominopelvic adenopathy. Reproductive: Enlarged prostate spans 5.9 cm transverse. Other: No ascites. No free air or focal fluid collection small bilateral inguinal hernias contain fat and a small amount of free fluid. Mild body wall edema. Musculoskeletal: The bones are subjectively under mineralized. Diffuse degenerative change throughout the lumbar spine with multilevel degenerative disc disease and facet hypertrophy. No fracture or acute osseous findings. IMPRESSION: 1. Bilateral nonobstructing intrarenal calculi. No ureteral stone or hydronephrosis. 2. Moderate diffuse colonic diverticulosis. Equivocal faint stranding about a diverticulum involving the distal descending colon, may represent mild diverticulitis. 3. Diffuse degenerative change throughout the spine without acute osseous findings. 4. Small left pleural  effusion. 5. Chronic incidental findings include small hiatal hernia. Splenomegaly. Enlarged prostate. 6. Small bilateral inguinal hernias contain fat and a small amount of free fluid. Aortic Atherosclerosis (ICD10-I70.0). Electronically Signed   By: Narda Rutherford M.D.   On: 05/22/2023 18:29    Time coordinating discharge: 50 mins  SIGNED:  Carollee Herter, DO [facility] 06/11/23, 3:35 PM

## 2023-06-11 NOTE — Progress Notes (Signed)
 Daily Progress Note   Patient Name: Samuel Little       Date: 06/11/2023 DOB: March 15, 1932  Age: 89 y.o. MRN#: [id number] Attending Physician: Carollee Herter, DO Primary Care Physician: Georgann Housekeeper, MD Admit Date: 06/08/2023 Length of Stay: 1 day  Reason for Consultation/Follow-up: Establishing goals of care  Subjective:   CC: Patient laying comfortably in bed.  Following up regarding complex medical decision making.  Subjective:  Reviewed EMR prior to presenting to bedside.  At time of EMR review in past 24 hours patient has used as needed oxycodone 10 mg x 1 dose.  Review of CBC this morning noted hemoglobin 7.9, WBC 2.5, and platelets 17.  Blood transfusion not required based on lab work.  Presented to bedside to see patient.  Patient lying comfortably in bed with nurse assisting with care.  No family present at bedside.  Noted will call patient's family.  Able to speak with patient's daughter, Bonita Quin.  Bonita Quin noted that multiple family members would be visiting today.  Discussed patient's lab work and that he does not need a transfusion at this time.  Again reviewed what had been discussed yesterday with her and her mother.  Transitioning to full comfort focused care at this time.  Discontinuing interventions not focused on symptom management at the end of life.  Family still requesting evaluation for [facility].  Patient had been receiving rehab at [facility] so would not be able to return there.  Noted [facility] liaison would reach out to family today for discussion.  All questions answered at that time.  Noted palliative medicine team continue to follow along with patient's medical journey.  Discussed care with team afterwards including hospitalist, RN, TOC, and [facility] liaison.  Objective:   Vital Signs:  BP (!) 150/75 (BP Location: Right Arm)   Pulse (!) 44   Temp 97.9 F (36.6 C) (Oral)   Resp 16   Ht 6' (1.829 m)   Wt 104 kg   SpO2 96%   BMI 31.09 kg/m   Physical Exam: General:  Awake, ill-appearing, hard of hearing Cardiovascular: Bradycardia noted Respiratory: no increased work of breathing noted, not in respiratory distress  Imaging: I personally reviewed recent imaging.   Assessment & Plan:   Assessment: Patient is a 89 year old male with past medical history of hypertension, hypothyroidism, AVNRT status post ablation, and chronic low back pain with no neurogenic claudication who was admitted on 06/08/2023 from rehab after PICC line noted to be bleeding at insertion site.  Patient had recently been admitted to [facility] and had been discharged on 05/29/2023 after being found to have MSSA bacteremia and was then discharged to rehab.  Since admission, patient has continued receiving management for MSSA bacteremia and bleeding.  Patient noted to have pancytopenia which was followed by oncology.  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  Patient is not a candidate for leukemia directed therapies due to age and medical conditions.  Palliative medicine team consulted to assist with complex medical decision making.   Recommendations/Plan: # Complex medical decision making/goals of care                -Had discussed care with patient, patient's wife, and patient's daughter on 06/10/2023 to develop plan.  Patient received 1 more lab checked this morning and does not require blood transfusion.  At this time transitioning to full comfort focused  care. Discussed care with family as detailed above in HPI (patient has expressed desire to defer hospice related talks to his family). PMT continuing to follow along with patient's medical journey.   -At this time we will discontinue interventions that are no longer focused on comfort such as IV fluids, imaging, antibiotics, or lab work.  Will instead focus on symptom management of pain, dyspnea, and agitation in the  setting of end-of-life care.    Code Status: Do not attempt resuscitation (DNR) - Comfort care  # Symptom management Patient is receiving these palliative interventions for symptom management with an intent to improve quality of life.     -Pain/Dyspnea, acute in the setting of end-of-life care                Patient was not on medications for pain previously.                              -Continue oxycodone 5-10 mg every 3 hours as needed for moderate pain   -Continue IV morphine 1-2 mg every hour as needed for severe pain                  -Anxiety/agitation, in the setting of end-of-life care                               -Start IV Ativan 0.5 mg every 4 hours as needed. Continue to adjust based on patient's symptom burden.                                    -Secretions, in the setting of end-of-life care                               -Start IV glycopyrrolate 0.2 mg every 4 hours as needed.    # Psycho-social/Spiritual Support:  - Support System: Wife, daughter, other children and grandchildren  # Discharge Planning: Patient now with full comfort focused care.  Awaiting evaluation by [facility] liaison as family requesting [facility].   Thank you for allowing the palliative care team to participate in the care Alexsander Quentin Cornwall.  Alvester Morin, DO Palliative Care Provider PMT # [phone]  If patient remains symptomatic despite maximum doses, please call PMT at [phone] between 0700 and 1900. Outside of these hours, please call attending, as PMT does not have night coverage.

## 2023-06-11 NOTE — Consult Note (Addendum)
 WOC Nurse Consult Note: Reason for Consult: sacral wound  Wound type: 1.  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  Deep Tissue Pressure Injuries B buttocks  Pressure Injury POA: Yes Measurement: see nursing flowsheet  Wound bed: 1.  Purple maroon discoloration to sacrum that has evolved with yellow necrotic tissue present  2.  Purple maroon discoloration B buttocks that is evolving to Stage 2  Drainage (amount, consistency, odor)  per nursing flowsheet  Periwound: erythema  Dressing procedure/placement/frequency:  Cleanse B buttocks/sacrum with NS, apply Xeroform gauze [place] [phone]) to wound beds daily. Coat surrounding skin with floor stock moisture barrier.  Cover with silicone foam or ABD pad whichever is preferred.   Patient has plans to discharge to residential hospice and is comfort therefore will not order any chemical debrider for necrotic tissue.   POC discussed with bedside nurse. WOC team will not follow. Re-consult if further needs arise.   Thank you,    Priscella Mann MSN, RN-BC, [employer] [phone]

## 2023-06-11 NOTE — Assessment & Plan Note (Signed)
 06-11-2023 pt is DNR/DNI/Comfort care.

## 2023-06-11 NOTE — TOC Transition Note (Signed)
 Transition of Care [facility]) - Discharge Note   Patient Details  Name: Samuel Little MRN: [id number] Date of Birth: [birth year]  Transition of Care [facility]) CM/SW Contact:  Diona Browner, LCSW Phone Number: 06/11/2023, 4:01 PM   Clinical Narrative:    Pt approved to d/c to [facility]. D/c packet with signed DNR left at nurses station. PTAR called at 4:14pm.No further TOC needs.    Final next level of care: Hospice Medical Facility Barriers to Discharge: Barriers Resolved   Patient Goals and CMS Choice Patient states their goals for this hospitalization and ongoing recovery are:: [facility] CMS Medicare.gov Compare Post Acute Care list provided to::  (NA) Choice offered to / list presented to : NA [place] ownership interest in [facility]provided to::  (NA)    Discharge Placement                Patient to be transferred to facility by: PTAR Name of family member notified: Jeremih, Dearmas (Spouse)  [phone] (Mobile) Patient and family notified of of transfer: 06/11/23  Discharge Plan and Services Additional resources added to the After Visit Summary for                  DME Arranged: N/A DME Agency: NA       HH Arranged: NA HH Agency: NA        Social Drivers of Health (SDOH) Interventions SDOH Screenings   Food Insecurity: No Food Insecurity (06/09/2023)  Housing: Low Risk  (06/09/2023)  Transportation Needs: No Transportation Needs (06/09/2023)  Utilities: Not At Risk (06/09/2023)  Social Connections: Moderately Integrated (06/09/2023)  Tobacco Use: Low Risk  (06/09/2023)     Readmission Risk Interventions    06/11/2023    3:55 PM  Readmission Risk Prevention Plan  Transportation Screening Complete  Medication Review (RN Care Manager) Complete  PCP or Specialist appointment within 3-5 days of discharge Complete  HRI or Home Care Consult Complete  SW Recovery Care/Counseling Consult Complete  Palliative Care Screening Complete   Skilled Nursing Facility Not Applicable

## 2023-06-12 LAB — TYPE AND SCREEN
ABO/RH(D): O POS
Antibody Screen: NEGATIVE
Unit division: 0
Unit division: 0

## 2023-06-12 LAB — BPAM PLATELET PHERESIS
Blood Product Expiration Date: 202503302359
ISSUE DATE / TIME: 202503281326
Unit Type and Rh: 6200

## 2023-06-12 LAB — PREPARE PLATELET PHERESIS: Unit division: 0

## 2023-06-12 LAB — BPAM RBC
Blood Product Expiration Date: 202504292359
Blood Product Expiration Date: 202504292359
ISSUE DATE / TIME: 202503280020
ISSUE DATE / TIME: 202503280302
Unit Type and Rh: 5100
Unit Type and Rh: 5100

## 2023-06-19 ENCOUNTER — Telehealth: Payer: Self-pay

## 2023-06-19 ENCOUNTER — Ambulatory Visit (INDEPENDENT_AMBULATORY_CARE_PROVIDER_SITE_OTHER): Admitting: Internal Medicine

## 2023-06-19 ENCOUNTER — Other Ambulatory Visit: Payer: Self-pay

## 2023-06-19 DIAGNOSIS — Z91199 Patient's noncompliance with other medical treatment and regimen due to unspecified reason: Secondary | ICD-10-CM

## 2023-06-19 NOTE — Telephone Encounter (Signed)
 Called [facility] to reschedule visit patient missed and was informed by Sharyl Nimrod that patient is EOL Care with Hospice. Canceled appointment and notified Dr.Singh.

## 2023-06-26 ENCOUNTER — Telehealth: Payer: Self-pay | Admitting: Medical Oncology

## 2023-06-26 ENCOUNTER — Encounter (HOSPITAL_COMMUNITY): Payer: Self-pay | Admitting: Physician Assistant

## 2023-06-26 NOTE — Telephone Encounter (Signed)
 Samuel Little stated pt died on 06/27/2023 .

## 2023-06-28 ENCOUNTER — Inpatient Hospital Stay

## 2023-06-28 ENCOUNTER — Inpatient Hospital Stay: Admitting: Internal Medicine

## 2023-07-09 NOTE — Progress Notes (Signed)
 Not seen

## 2023-07-13 DEATH — deceased
